# Patient Record
Sex: Female | Born: 1950 | Race: Black or African American | Hispanic: No | Marital: Married | Smoking: Never smoker
Health system: Southern US, Community
[De-identification: ages and names within clinical notes are randomized; demographics above are authoritative.]

## PROBLEM LIST (undated history)

## (undated) DIAGNOSIS — E119 Type 2 diabetes mellitus without complications: Secondary | ICD-10-CM

---

## 2003-12-24 ENCOUNTER — Ambulatory Visit: Payer: Self-pay

## 2004-01-24 ENCOUNTER — Ambulatory Visit: Payer: Self-pay

## 2004-02-23 ENCOUNTER — Ambulatory Visit: Payer: Self-pay

## 2005-10-20 ENCOUNTER — Emergency Department: Payer: Self-pay | Admitting: Emergency Medicine

## 2007-07-01 ENCOUNTER — Emergency Department: Payer: Self-pay | Admitting: Emergency Medicine

## 2007-07-07 ENCOUNTER — Other Ambulatory Visit: Payer: Self-pay

## 2007-07-07 ENCOUNTER — Ambulatory Visit: Payer: Self-pay | Admitting: Unknown Physician Specialty

## 2007-07-08 ENCOUNTER — Ambulatory Visit: Payer: Self-pay | Admitting: Cardiology

## 2008-02-15 ENCOUNTER — Ambulatory Visit: Payer: Self-pay | Admitting: Unknown Physician Specialty

## 2008-02-22 ENCOUNTER — Ambulatory Visit: Payer: Self-pay | Admitting: Unknown Physician Specialty

## 2008-05-13 ENCOUNTER — Ambulatory Visit: Payer: Self-pay | Admitting: Unknown Physician Specialty

## 2008-05-20 ENCOUNTER — Ambulatory Visit: Payer: Self-pay | Admitting: Unknown Physician Specialty

## 2009-03-20 ENCOUNTER — Inpatient Hospital Stay: Payer: Self-pay | Admitting: Internal Medicine

## 2011-11-21 ENCOUNTER — Ambulatory Visit: Payer: Self-pay | Admitting: Family Medicine

## 2011-12-16 ENCOUNTER — Ambulatory Visit: Payer: Self-pay | Admitting: Family Medicine

## 2012-01-09 ENCOUNTER — Ambulatory Visit: Payer: Self-pay | Admitting: Family Medicine

## 2013-04-18 ENCOUNTER — Emergency Department: Payer: Self-pay | Admitting: Internal Medicine

## 2013-04-18 LAB — URINALYSIS, COMPLETE
Bilirubin,UR: NEGATIVE
Blood: NEGATIVE
Glucose,UR: 150 mg/dL (ref 0–75)
Hyaline Cast: 7
Ketone: NEGATIVE
Nitrite: NEGATIVE
Ph: 5 (ref 4.5–8.0)
Specific Gravity: 1.024 (ref 1.003–1.030)
Squamous Epithelial: 25
WBC UR: 10 /HPF (ref 0–5)

## 2013-04-18 LAB — COMPREHENSIVE METABOLIC PANEL
ALT: 18 U/L (ref 12–78)
ANION GAP: 7 (ref 7–16)
Albumin: 3.2 g/dL — ABNORMAL LOW (ref 3.4–5.0)
Alkaline Phosphatase: 88 U/L
BUN: 13 mg/dL (ref 7–18)
Bilirubin,Total: 0.2 mg/dL (ref 0.2–1.0)
CO2: 28 mmol/L (ref 21–32)
Calcium, Total: 9.2 mg/dL (ref 8.5–10.1)
Chloride: 99 mmol/L (ref 98–107)
Creatinine: 1.01 mg/dL (ref 0.60–1.30)
EGFR (Non-African Amer.): >60
Glucose: 239 mg/dL — ABNORMAL HIGH (ref 65–99)
Osmolality: 276 (ref 275–301)
POTASSIUM: 4.2 mmol/L (ref 3.5–5.1)
SGOT(AST): 19 U/L (ref 15–37)
Sodium: 134 mmol/L — ABNORMAL LOW (ref 136–145)
TOTAL PROTEIN: 7.4 g/dL (ref 6.4–8.2)

## 2013-04-18 LAB — CBC
HCT: 37 % (ref 35.0–47.0)
HGB: 11.4 g/dL — ABNORMAL LOW (ref 12.0–16.0)
MCH: 24.6 pg — ABNORMAL LOW (ref 26.0–34.0)
MCHC: 30.7 g/dL — ABNORMAL LOW (ref 32.0–36.0)
MCV: 80 fL (ref 80–100)
Platelet: 181 10*3/uL (ref 150–440)
RBC: 4.63 10*6/uL (ref 3.80–5.20)
RDW: 16.9 % — ABNORMAL HIGH (ref 11.5–14.5)
WBC: 7.8 10*3/uL (ref 3.6–11.0)

## 2013-05-03 ENCOUNTER — Encounter: Payer: Self-pay | Admitting: Family Medicine

## 2013-05-23 ENCOUNTER — Encounter: Payer: Self-pay | Admitting: Family Medicine

## 2013-07-07 ENCOUNTER — Encounter: Payer: Self-pay | Admitting: Surgery

## 2013-07-23 ENCOUNTER — Encounter: Payer: Self-pay | Admitting: Surgery

## 2014-11-23 ENCOUNTER — Ambulatory Visit
Admission: RE | Admit: 2014-11-23 | Discharge: 2014-11-23 | Disposition: A | Discharge status: Home / Self Care | Payer: Medicare HMO | Source: Ambulatory Visit | Attending: Family Medicine | Admitting: Family Medicine

## 2014-11-23 ENCOUNTER — Other Ambulatory Visit: Payer: Self-pay | Admitting: Family Medicine

## 2014-11-23 DIAGNOSIS — M8580 Other specified disorders of bone density and structure, unspecified site: Secondary | ICD-10-CM | POA: Diagnosis not present

## 2014-11-23 DIAGNOSIS — M25551 Pain in right hip: Secondary | ICD-10-CM

## 2014-11-23 DIAGNOSIS — M79606 Pain in leg, unspecified: Secondary | ICD-10-CM

## 2015-03-02 ENCOUNTER — Other Ambulatory Visit: Payer: Self-pay | Admitting: Family Medicine

## 2015-03-02 DIAGNOSIS — Z1231 Encounter for screening mammogram for malignant neoplasm of breast: Secondary | ICD-10-CM

## 2015-03-13 ENCOUNTER — Ambulatory Visit
Admission: RE | Admit: 2015-03-13 | Discharge: 2015-03-13 | Disposition: A | Discharge status: Home / Self Care | Payer: Medicare HMO | Source: Ambulatory Visit | Attending: Family Medicine | Admitting: Family Medicine

## 2015-03-13 DIAGNOSIS — Z1231 Encounter for screening mammogram for malignant neoplasm of breast: Secondary | ICD-10-CM | POA: Insufficient documentation

## 2016-07-12 ENCOUNTER — Other Ambulatory Visit
Admission: RE | Admit: 2016-07-12 | Discharge: 2016-07-12 | Disposition: A | Discharge status: Home / Self Care | Payer: Medicare HMO | Source: Ambulatory Visit | Attending: Nurse Practitioner | Admitting: Nurse Practitioner

## 2016-07-12 ENCOUNTER — Ambulatory Visit
Admission: RE | Admit: 2016-07-12 | Discharge: 2016-07-12 | Disposition: A | Discharge status: Home / Self Care | Payer: Medicare HMO | Source: Ambulatory Visit | Attending: Nurse Practitioner | Admitting: Nurse Practitioner

## 2016-07-12 ENCOUNTER — Encounter: Payer: Medicare HMO | Attending: Nurse Practitioner | Admitting: Nurse Practitioner

## 2016-07-12 ENCOUNTER — Other Ambulatory Visit: Payer: Self-pay | Admitting: Nurse Practitioner

## 2016-07-12 DIAGNOSIS — Z885 Allergy status to narcotic agent status: Secondary | ICD-10-CM | POA: Diagnosis not present

## 2016-07-12 DIAGNOSIS — E11622 Type 2 diabetes mellitus with other skin ulcer: Secondary | ICD-10-CM | POA: Insufficient documentation

## 2016-07-12 DIAGNOSIS — I110 Hypertensive heart disease with heart failure: Secondary | ICD-10-CM | POA: Insufficient documentation

## 2016-07-12 DIAGNOSIS — E11621 Type 2 diabetes mellitus with foot ulcer: Secondary | ICD-10-CM | POA: Diagnosis present

## 2016-07-12 DIAGNOSIS — Z794 Long term (current) use of insulin: Secondary | ICD-10-CM | POA: Insufficient documentation

## 2016-07-12 DIAGNOSIS — L97529 Non-pressure chronic ulcer of other part of left foot with unspecified severity: Secondary | ICD-10-CM | POA: Insufficient documentation

## 2016-07-12 DIAGNOSIS — M1990 Unspecified osteoarthritis, unspecified site: Secondary | ICD-10-CM | POA: Insufficient documentation

## 2016-07-12 DIAGNOSIS — I509 Heart failure, unspecified: Secondary | ICD-10-CM | POA: Diagnosis not present

## 2016-07-12 DIAGNOSIS — Z8673 Personal history of transient ischemic attack (TIA), and cerebral infarction without residual deficits: Secondary | ICD-10-CM | POA: Insufficient documentation

## 2016-07-12 DIAGNOSIS — M85872 Other specified disorders of bone density and structure, left ankle and foot: Secondary | ICD-10-CM | POA: Insufficient documentation

## 2016-07-12 DIAGNOSIS — B999 Unspecified infectious disease: Secondary | ICD-10-CM

## 2016-07-12 DIAGNOSIS — F17218 Nicotine dependence, cigarettes, with other nicotine-induced disorders: Secondary | ICD-10-CM | POA: Insufficient documentation

## 2016-07-12 DIAGNOSIS — L97511 Non-pressure chronic ulcer of other part of right foot limited to breakdown of skin: Secondary | ICD-10-CM | POA: Insufficient documentation

## 2016-07-12 DIAGNOSIS — L089 Local infection of the skin and subcutaneous tissue, unspecified: Secondary | ICD-10-CM | POA: Insufficient documentation

## 2016-07-12 DIAGNOSIS — J449 Chronic obstructive pulmonary disease, unspecified: Secondary | ICD-10-CM | POA: Insufficient documentation

## 2016-07-12 DIAGNOSIS — Z880 Allergy status to penicillin: Secondary | ICD-10-CM | POA: Diagnosis not present

## 2016-07-12 DIAGNOSIS — F329 Major depressive disorder, single episode, unspecified: Secondary | ICD-10-CM | POA: Insufficient documentation

## 2016-07-12 DIAGNOSIS — L97212 Non-pressure chronic ulcer of right calf with fat layer exposed: Secondary | ICD-10-CM | POA: Insufficient documentation

## 2016-07-14 NOTE — Progress Notes (Signed)
[name] ([id number]) Visit Report for 07/12/2016 Abuse/Suicide Risk Screen Details Patient Name: [name], [name]. Date of Service: 07/12/2016 10:30 AM Medical Record Number: [id number] Patient Account Number: [id number] Date of Birth/Sex: [birth year] (66 y.o. Female) Treating RN: Ashok Cordia, [name] Primary Care Pebbles Zeiders: Tonia Ghent Other Clinician: Referring Dannetta Lekas: Tonia Ghent Treating Suttyn Cryder/Extender: Kathreen Cosier in Treatment: 0 Abuse/Suicide Risk Screen Items Answer ABUSE/SUICIDE RISK SCREEN: Has anyone close to you tried to hurt or harm you recentlyo No Do you feel uncomfortable with anyone in your familyo No Has anyone forced you do things that you didnot want to doo No Do you have any thoughts of harming yourselfo No Patient displays signs or symptoms of abuse and/or neglect. No Electronic Signature(s) Signed: 07/12/2016 4:12:19 PM By: Alejandro Mulling Entered By: Alejandro Mulling on 07/12/2016 10:58:17 Hurrell, Timmy J. ([id number]) -------------------------------------------------------------------------------- Activities of Daily Living Details Patient Name: [name]. Date of Service: 07/12/2016 10:30 AM Medical Record Number: [id number] Patient Account Number: [id number] Date of Birth/Sex: [birth year] (66 y.o. Female) Treating RN: Ashok Cordia, [name] Primary Care Sylva Overley: Tonia Ghent Other Clinician: Referring Cheyenne Schumm: Tonia Ghent Treating Aliyanah Rozas/Extender: Kathreen Cosier in Treatment: 0 Activities of Daily Living Items Answer Activities of Daily Living (Please select one for each item) Drive Automobile Completely Able Take Medications Completely Able Use Telephone Completely Able Care for Appearance Completely Able Use Toilet Completely Able Bath / Shower Completely Able Dress Self Completely Able Feed Self Completely Able Walk Need Assistance Get In / Out Bed Completely Able Housework Completely Able Prepare Meals Completely Able Handle  Money Completely Able Shop for Self Completely Able Electronic Signature(s) Signed: 07/12/2016 4:12:19 PM By: Alejandro Mulling Entered By: Alejandro Mulling on 07/12/2016 10:58:50 Porzio, [name] ([id number]) -------------------------------------------------------------------------------- Education Assessment Details Patient Name: [name]. Date of Service: 07/12/2016 10:30 AM Medical Record Number: [id number] Patient Account Number: [id number] Date of Birth/Sex: 1950-12-05 (66 y.o. Female) Treating RN: Ashok Cordia, [name] Primary Care Whitlee Sluder: Tonia Ghent Other Clinician: Referring Jadalyn Oliveri: Tonia Ghent Treating Amari Burnsworth/Extender: Kathreen Cosier in Treatment: 0 Learning Preferences/Education Level/Primary Language Learning Preference: Explanation, Printed Material Highest Education Level: College or Above Preferred Language: English Cognitive Barrier Assessment/Beliefs Language Barrier: No Translator Needed: No Memory Deficit: No Emotional Barrier: No Cultural/Religious Beliefs Affecting Medical No Care: Physical Barrier Assessment Impaired Vision: Yes Glasses Impaired Hearing: No Decreased Hand dexterity: No Knowledge/Comprehension Assessment Knowledge Level: Medium Comprehension Level: Medium Ability to understand written Medium instructions: Ability to understand verbal Medium instructions: Motivation Assessment Anxiety Level: Calm Cooperation: Cooperative Education Importance: Acknowledges Need Interest in Health Problems: Asks Questions Perception: Coherent Willingness to Engage in Self- Medium Management Activities: Readiness to Engage in Self- Medium Management Activities: Electronic Signature(s) Signed: 07/12/2016 4:12:19 PM By: Hollie Beach, Shequita J. ([id number]) Entered By: Alejandro Mulling on 07/12/2016 10:59:12 Muench, Wilfred Shela Commons ([id number]) -------------------------------------------------------------------------------- Fall Risk  Assessment Details Patient Name: [name]. Date of Service: 07/12/2016 10:30 AM Medical Record Number: [id number] Patient Account Number: [id number] Date of Birth/Sex: 1950-11-15 (66 y.o. Female) Treating RN: Ashok Cordia, [name] Primary Care Lillyrose Reitan: Tonia Ghent Other Clinician: Referring Dariel Betzer: Tonia Ghent Treating Shirlyn Savin/Extender: Kathreen Cosier in Treatment: 0 Fall Risk Assessment Items Have you had 2 or more falls in the last 12 monthso 0 No Have you had any fall that resulted in injury in the last 12 monthso 0 No FALL RISK ASSESSMENT: History of falling - immediate or within 3 months 0 No Secondary diagnosis 15 Yes Ambulatory aid None/bed rest/wheelchair/nurse 0 No Crutches/cane/walker 15 Yes Furniture 0 No  IV Access/Saline Lock 0 No Gait/Training Normal/bed rest/immobile 0 No Weak 0 No Impaired 20 Yes Mental Status Oriented to own ability 0 Yes Electronic Signature(s) Signed: 07/12/2016 4:12:19 PM By: Alejandro Mulling Entered By: Alejandro Mulling on 07/12/2016 10:59:43 Mcclane, Henya J. ([id number]) -------------------------------------------------------------------------------- Foot Assessment Details Patient Name: [name]. Date of Service: 07/12/2016 10:30 AM Medical Record Number: [id number] Patient Account Number: [id number] Date of Birth/Sex: 06-03-1950 (66 y.o. Female) Treating RN: Ashok Cordia, [name] Primary Care Piper Hassebrock: Tonia Ghent Other Clinician: Referring Laina Guerrieri: Tonia Ghent Treating Almond Fitzgibbon/Extender: Kathreen Cosier in Treatment: 0 Foot Assessment Items Site Locations + = Sensation present, - = Sensation absent, C = Callus, U = Ulcer R = Redness, W = Warmth, M = Maceration, PU = Pre-ulcerative lesion F = Fissure, S = Swelling, D = Dryness Assessment Right: Left: Other Deformity: No No Prior Foot Ulcer: No No Prior Amputation: No No Charcot Joint: No No Ambulatory Status: Ambulatory With Help Assistance Device: Cane Gait:  Steady Electronic Signature(s) Signed: 07/12/2016 4:12:19 PM By: Alejandro Mulling Entered By: Alejandro Mulling on 07/12/2016 11:02:36 Torosian, Shanyah J. ([id number]) -------------------------------------------------------------------------------- Nutrition Risk Assessment Details Patient Name: [name]. Date of Service: 07/12/2016 10:30 AM Medical Record Number: [id number] Patient Account Number: [id number] Date of Birth/Sex: 07-23-1950 (66 y.o. Female) Treating RN: Ashok Cordia, [name] Primary Care Kinga Cassar: Tonia Ghent Other Clinician: Referring Galo Sayed: Tonia Ghent Treating Darral Rishel/Extender: Kathreen Cosier in Treatment: 0 Height (in): 63 Weight (lbs): 224 Body Mass Index (BMI): 39.7 Nutrition Risk Assessment Items NUTRITION RISK SCREEN: I have an illness or condition that made me change the kind and/or 2 Yes amount of food I eat I eat fewer than two meals per day 3 Yes I eat few fruits and vegetables, or milk products 0 No I have three or more drinks of beer, liquor or wine almost every day 0 No I have tooth or mouth problems that make it hard for me to eat 0 No I don't always have enough money to buy the food I need 0 No I eat alone most of the time 0 No I take three or more different prescribed or over-the-counter drugs a 1 Yes day Without wanting to, I have lost or gained 10 pounds in the last six 0 No months I am not always physically able to shop, cook and/or feed myself 0 No Nutrition Protocols Good Risk Protocol Moderate Risk Protocol Electronic Signature(s) Signed: 07/12/2016 4:12:19 PM By: Alejandro Mulling Entered By: Alejandro Mulling on 07/12/2016 11:00:15

## 2016-07-14 NOTE — Progress Notes (Signed)
KEIMORA, SWARTOUT ([id number]) Visit Report for 07/12/2016 Allergy List Details Patient Name: [name], STEPP. Date of Service: 07/12/2016 10:30 AM Medical Record Number: [id number] Patient Account Number: [id number] Date of Birth/Sex: 1951/03/17 (66 y.o. Female) Treating RN: Ashok Cordia, [name] Primary Care Phillipe Clemon: Tonia Ghent Other Clinician: Referring Ishmeal Rorie: Tonia Ghent Treating Curties Conigliaro/Extender: Kathreen Cosier in Treatment: 0 Allergies Active Allergies morphine hydrocodone codeine mercury based products penicillin bee stings Allergy Notes Electronic Signature(s) Signed: 07/12/2016 4:12:19 PM By: Alejandro Mulling Entered By: Alejandro Mulling on 07/12/2016 10:53:59 [name], [name] ([id number]) -------------------------------------------------------------------------------- Arrival Information Details Patient Name: [name], WHITIS. Date of Service: 07/12/2016 10:30 AM Medical Record Number: [id number] Patient Account Number: [id number] Date of Birth/Sex: 12-29-1950 (66 y.o. Female) Treating RN: Ashok Cordia, [name] Primary Care Winner Valeriano: Tonia Ghent Other Clinician: Referring Abril Cappiello: Tonia Ghent Treating Merlen Gurry/Extender: Kathreen Cosier in Treatment: 0 Visit Information Patient Arrived: Cane Arrival Time: 10:46 Accompanied By: self Transfer Assistance: EasyPivot Patient Lift Patient Identification Verified: Yes Secondary Verification Process Yes Completed: Patient Requires Transmission- No Based Precautions: Patient Has Alerts: Yes Patient Alerts: Dm II History Since Last Visit All ordered tests and consults were completed: No Added or deleted any medications: No Any new allergies or adverse reactions: No Had a fall or experienced change in activities of daily living that may affect risk of falls: No Signs or symptoms of abuse/neglect since last visito No Hospitalized since last visit: No Electronic Signature(s) Signed: 07/12/2016 4:12:19 PM By: Alejandro Mulling Entered By: Alejandro Mulling on 07/12/2016 10:49:58 Perillo, Janequa J. ([id number]) -------------------------------------------------------------------------------- Clinic Level of Care Assessment Details Patient Name: Kathleen Pigg. Date of Service: 07/12/2016 10:30 AM Medical Record Number: [id number] Patient Account Number: [id number] Date of Birth/Sex: [birth year] (66 y.o. Female) Treating RN: Ashok Cordia, [name] Primary Care Marizol Borror: Tonia Ghent Other Clinician: Referring Shatia Sindoni: Tonia Ghent Treating Colen Eltzroth/Extender: Kathreen Cosier in Treatment: 0 Clinic Level of Care Assessment Items TOOL 1 Quantity Score X - Use when EandM and Procedure is performed on INITIAL visit 1 0 ASSESSMENTS - Nursing Assessment / Reassessment X - General Physical Exam (combine w/ comprehensive assessment (listed just 1 20 below) when performed on new pt. evals) X - Comprehensive Assessment (HX, ROS, Risk Assessments, Wounds Hx, etc.) 1 25 ASSESSMENTS - Wound and Skin Assessment / Reassessment []  - Dermatologic / Skin Assessment (not related to wound area) 0 ASSESSMENTS - Ostomy and/or Continence Assessment and Care []  - Incontinence Assessment and Management 0 []  - Ostomy Care Assessment and Management (repouching, etc.) 0 PROCESS - Coordination of Care []  - Simple Patient / Family Education for ongoing care 0 X - Complex (extensive) Patient / Family Education for ongoing care 1 20 X - Staff obtains Chiropractor, Records, Test Results / Process Orders 1 10 []  - Staff telephones HHA, Nursing Homes / Clarify orders / etc 0 []  - Routine Transfer to another Facility (non-emergent condition) 0 []  - Routine Hospital Admission (non-emergent condition) 0 X - New Admissions / Manufacturing engineer / Ordering NPWT, Apligraf, etc. 1 15 []  - Emergency Hospital Admission (emergent condition) 0 PROCESS - Special Needs []  - Pediatric / Minor Patient Management 0 []  - Isolation Patient Management  0 [name], [name] ([id number]) []  - Hearing / Language / Visual special needs 0 []  - Assessment of Community assistance (transportation, D/C planning, etc.) 0 []  - Additional assistance / Altered mentation 0 []  - Support Surface(s) Assessment (bed, cushion, seat, etc.) 0 INTERVENTIONS - Miscellaneous []  - External ear exam 0 []  - Patient Transfer (multiple staff /  Hoyer Lift / Similar devices) 0  - Simple Staple / Suture removal (25 or less) 0  - Complex Staple / Suture removal (26 or more) 0  - Hypo/Hyperglycemic Management (do not check if billed separately) 0 X - Ankle / Brachial Index (ABI) - do not check if billed separately 1 15 Has the patient been seen at the hospital within the last three years: Yes Total Score: 105 Level Of Care: New/Established - Level 3 Electronic Signature(s) Signed: 07/12/2016 4:12:19 PM By: Alejandro Mulling Entered By: Alejandro Mulling on 07/12/2016 13:57:10 [name], [name] -------------------------------------------------------------------------------- Encounter Discharge Information Details Patient Name: Kathleen Pigg. Date of Service: 07/12/2016 10:30 AM Medical Record Number: [id number] Patient Account Number: [id number] Date of Birth/Sex: 1951-01-01 (66 y.o. Female) Treating RN: Ashok Cordia, [name] Primary Care Nakiea Metzner: Tonia Ghent Other Clinician: Referring Khylin Gutridge: Tonia Ghent Treating Kimbery Harwood/Extender: Kathreen Cosier in Treatment: 0 Encounter Discharge Information Items Discharge Pain Level: 0 Discharge Condition: Stable Ambulatory Status: Cane Discharge Destination: Home Transportation: Private Auto Accompanied By: self Schedule Follow-up Appointment: Yes Medication Reconciliation completed No and provided to Patient/Care Lucretia Pendley: Provided on Clinical Summary of Care: 07/12/2016 Form Type Recipient Paper Patient EP Electronic Signature(s) Signed: 07/12/2016 12:16:26 PM By: Gwenlyn Perking Entered By: Gwenlyn Perking on 07/12/2016 12:16:26 [name], [name] ([id number]) -------------------------------------------------------------------------------- General Visit Notes Details Patient Name: Kathleen Pigg. Date of Service: 07/12/2016 10:30 AM Medical Record Number: [id number] Patient Account Number: [id number] Date of Birth/Sex: [birth year] (66 y.o. Female) Treating RN: Ashok Cordia, [name] Primary Care Sebastion Jun: Tonia Ghent Other Clinician: Referring Zaiden Ludlum: Tonia Ghent Treating Damari Hiltz/Extender: Kathreen Cosier in Treatment: 0 Notes Gave pt a blood sugar log. Electronic Signature(s) Signed: 07/12/2016 2:22:05 PM By: Alejandro Mulling Entered By: Alejandro Mulling on 07/12/2016 14:22:05 [name], [name] ([id number]) -------------------------------------------------------------------------------- Lower Extremity Assessment Details Patient Name: [name], CURRIE. Date of Service: 07/12/2016 10:30 AM Medical Record Number: [id number] Patient Account Number: [id number] Date of Birth/Sex: Nov 10, 1950 (66 y.o. Female) Treating RN: Ashok Cordia, [name] Primary Care Jeannine Pennisi: Tonia Ghent Other Clinician: Referring Betania Dizon: Tonia Ghent Treating Annmarie Plemmons/Extender: Kathreen Cosier in Treatment: 0 Edema Assessment Assessed: [Left: No] [Right: No] E[Left: dema] [Right: :] Calf Left: Right: Point of Measurement: 32 cm From Medial Instep 36 cm cm Ankle Left: Right: Point of Measurement: 11 cm From Medial Instep 19.7 cm cm Vascular Assessment Pulses: Dorsalis Pedis Palpable: [Left:Yes] Posterior Tibial Extremity colors, hair growth, and conditions: Extremity Color: [Left:Normal] Temperature of Extremity: [Left:Warm] Capillary Refill: [Left:< 3 seconds] Blood Pressure: Brachial: [Left:130] Dorsalis Pedis: 120 [Left:Dorsalis Pedis:] Ankle: Posterior Tibial: 122 [Left:Posterior Tibial: 0.94] Toe Nail Assessment Left: Right: Thick: Yes Discolored: No Deformed: No Improper Length and Hygiene:  No Electronic Signature(s) Signed: 07/12/2016 4:12:19 PM By: Hollie Beach, Nikky Shela Commons ([id number]) Entered By: Alejandro Mulling on 07/12/2016 11:08:52 Sanagustin, Erisha J. ([id number]) -------------------------------------------------------------------------------- Multi Wound Chart Details Patient Name: Kathleen Pigg. Date of Service: 07/12/2016 10:30 AM Medical Record Number: [id number] Patient Account Number: [id number] Date of Birth/Sex: [birth year] (66 y.o. 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] [3:the Lower Extremity] [N/A:N/A] Secondary Etiology: [2:Trauma, Other] [3:Infection -  not elsewhere classified] [N/A:N/A] Comorbid History: [2:Asthma, Chronic Obstructive Pulmonary Disease (COPD), Congestive Heart Failure, Congestive Heart Failure, Hypertension, Type II Diabetes, Osteoarthritis, Diabetes, Osteoarthritis, Neuropathy] [3:Asthma, Chronic Obstructive Pulmonary  Disease (COPD), Hypertension, Type II Neuropathy] [N/A:N/A] Date Acquired: [2:06/19/2016] [3:06/14/2016] [N/A:N/A] Weeks of Treatment: [2:0] [3:0] [N/A:N/A] Wound Status: [2:Open] [3:Open] [N/A:N/A] Clustered Wound: [2:Yes] [3:No] [N/A:N/A] Clustered Quantity: [2:2] [3:N/A] [N/A:N/A] Pending Amputation on No [3:Yes] [N/A:N/A] Presentation: Measurements L x W x D 0.6x1.5x0.1 [3:3x2.5x0.1] [N/A:N/A] (cm) Area (cm) : [2:0.707]  [3:5.89] [N/A:N/A] Volume (cm) : [2:0.071] [3:0.589] [N/A:N/A] % Reduction in Area: [2:0.00%] [3:0.00%] [N/A:N/A] % Reduction in Volume: 0.00% [3:0.00%] [N/A:N/A] Starting Position 1 [3:12] (o'clock): [3:4] Ending Position 1 (o'clock): Maximum Distance 1 2.5 (cm): Undermining: No Yes N/A Classification: Grade 1 Grade 1 N/A Exudate Amount: Medium Medium N/A Exudate Type: Serosanguineous Purulent N/A Exudate Color: red, brown yellow, brown, green N/A Wound Margin: Distinct, outline attached Distinct, outline attached N/A Granulation Amount: None Present (0%) None Present (0%) N/A Necrotic Amount: Large (67-100%) Large (67-100%) N/A Necrotic Tissue: Eschar, Adherent Slough Eschar, Adherent Slough N/A Exposed Structures: Fascia: No Fascia: No N/A Fat Layer (Subcutaneous Fat Layer (Subcutaneous Tissue) Exposed: No Tissue) Exposed: No Tendon: No Tendon: No Muscle: No Muscle: No Joint: No Joint: No Bone: No Bone: No Limited to Skin Limited to Skin Breakdown Breakdown Epithelialization: None None N/A Debridement: N/A Debridement ([id number] N/A [id number]) Pre-procedure N/A 11:47 N/A Verification/Time Out Taken: Pain Control: N/A Lidocaine 4% Topical N/A Solution Tissue Debrided: N/A Fibrin/Slough, Exudates, N/A Subcutaneous Level: N/A Skin/Subcutaneous N/A Tissue Debridement Area (sq N/A 7.5 N/A cm): Instrument: N/A Blade, Forceps N/A Specimen: N/A Swab N/A Number of Specimens N/A 1 N/A Taken: Bleeding: N/A Minimum N/A Hemostasis Achieved: N/A Pressure N/A Procedural Pain: N/A 0 N/A Post Procedural Pain: N/A 0 N/A Debridement Treatment N/A Procedure was tolerated N/A Response: well Post Debridement N/A 3x2.5x0.1 N/A Measurements L x W x D (cm) [name], [name] ([id number]) Post Debridement N/A 0.589 N/A Volume: (cm) Periwound Skin Texture: No Abnormalities Noted No Abnormalities Noted N/A Periwound Skin Dry/Scaly: Yes Dry/Scaly: Yes N/A Moisture: Periwound Skin  Color: No Abnormalities Noted No Abnormalities Noted N/A Temperature: No Abnormality No Abnormality N/A Tenderness on Yes Yes N/A Palpation: Wound Preparation: Ulcer Cleansing: Ulcer Cleansing: N/A Rinsed/Irrigated with Rinsed/Irrigated with Saline Saline Topical Anesthetic Topical Anesthetic Applied: Other: lidocaine Applied: Other: lidocaine 4% 4% Procedures Performed: N/A Debridement N/A Treatment Notes Wound #2 (Left, Lateral Lower Leg) 1. Cleansed with: Clean wound with Normal Saline 2. Anesthetic Topical Lidocaine 4% cream to wound bed prior to debridement 3. Peri-wound Care: Skin Prep 4. Dressing Applied: Iodoflex 5. Secondary Dressing Applied Bordered Foam Dressing Dry Gauze Wound #3 (Left, Medial Toe Great) 1. Cleansed with: Clean wound with Normal Saline 2. Anesthetic Topical Lidocaine 4% cream to wound bed prior to debridement 4. Dressing Applied: Iodoflex 5. Secondary Dressing Applied Dry Gauze Kerlix/Conform 7. Secured with Tape Notes darco shoe [name], [name] ([id number]) Electronic Signature(s) Signed: 07/12/2016 1:01:38 PM By: Bonnell Public Entered By: Bonnell Public on 07/12/2016 13:01:38 [name], [name] ([id number]) -------------------------------------------------------------------------------- Multi-Disciplinary Care Plan Details Patient Name: NAKESHIA, WALDECK. Date of Service: 07/12/2016 10:30 AM Medical Record Number: [id number] Patient Account Number: [id number] Date of Birth/Sex: [birth year] (66 y.o. Female) Treating RN: Ashok Cordia, [name] Primary Care Jeani Fassnacht: Tonia Ghent Other Clinician: Referring Doha Boling: Tonia Ghent Treating Treesa Mccully/Extender: Kathreen Cosier in Treatment: 0 Active Inactive ` Abuse / Safety / Falls / Self Care Management Nursing Diagnoses: Potential for falls Goals: Patient will remain  injury free Date Initiated: 07/12/2016 Target Resolution Date: 10/26/2016 Goal Status: Active Interventions: Assess fall risk on  admission and as needed Assess impairment of mobility on admission and as needed per policy Notes: ` Nutrition Nursing Diagnoses: Imbalanced nutrition Impaired glucose control: actual or potential Potential for alteratiion in Nutrition/Potential for imbalanced nutrition Goals: Patient/caregiver agrees to and verbalizes understanding of need to use nutritional supplements and/or vitamins as prescribed Date Initiated: 07/12/2016 Target Resolution Date: 09/28/2016 Goal Status: Active Interventions: Assess patient nutrition upon admission and as needed per policy Provide education on elevated blood sugars and impact on wound healing Notes: BOBETTE, LEYH ([id number]) Orientation to the Wound Care Program Nursing Diagnoses: Knowledge deficit related to the wound healing center program Goals: Patient/caregiver will verbalize understanding of the Wound Healing Center Program Date Initiated: 07/12/2016 Target Resolution Date: 07/27/2016 Goal Status: Active Interventions: Provide education on orientation to the wound center Notes: ` Pain, Acute or Chronic Nursing Diagnoses: Pain, acute or chronic: actual or potential Potential alteration in comfort, pain Goals: Patient/caregiver will verbalize adequate pain control between visits Date Initiated: 07/12/2016 Target Resolution Date: 10/26/2016 Goal Status: Active Interventions: Assess comfort goal upon admission Complete pain assessment as per visit requirements Notes: ` Soft Tissue Infection Nursing Diagnoses: Impaired tissue integrity Knowledge deficit related to disease process and management Knowledge deficit related to home infection control: handwashing, handling of soiled dressings, supply storage Goals: Patient/caregiver will verbalize understanding of or measures to prevent infection and contamination in the home setting Date Initiated: 07/12/2016 Target Resolution Date: 09/28/2016 Goal Status: Active MERYN, SARRACINO  ([id number]) Interventions: Assess signs and symptoms of infection every visit Notes: ` Wound/Skin Impairment Nursing Diagnoses: Impaired tissue integrity Knowledge deficit related to smoking impact on wound healing Knowledge deficit related to ulceration/compromised skin integrity Goals: Ulcer/skin breakdown will have a volume reduction of 80% by week 12 Date Initiated: 07/12/2016 Target Resolution Date: 10/26/2016 Goal Status: Active Interventions: Assess patient/caregiver ability to perform ulcer/skin care regimen upon admission and as needed Assess ulceration(s) every visit Provide education on smoking Notes: Electronic Signature(s) Signed: 07/12/2016 4:12:19 PM By: Alejandro Mulling Entered By: Alejandro Mulling on 07/12/2016 11:25:10 Leone, Ladaja J. ([id number]) -------------------------------------------------------------------------------- Pain Assessment Details Patient Name: Kathleen Pigg. Date of Service: 07/12/2016 10:30 AM Medical Record Number: [id number] Patient Account Number: [id number] Date of Birth/Sex: 12-Mar-1951 (66 y.o. Female) Treating RN: Ashok Cordia, [name] Primary Care Aldahir Litaker: Tonia Ghent Other Clinician: Referring Marjoria Mancillas: Tonia Ghent Treating [place]/Extender: Kathreen Cosier in Treatment: 0 Active Problems Location of Pain Severity and Description of Pain Patient Has Paino Yes Site Locations Pain Location: Pain in Ulcers With Dressing Change: Yes Rate the pain. Current Pain Level: 8 Character of Pain Describe the Pain: Aching Pain Management and Medication Current Pain Management: Electronic Signature(s) Signed: 07/12/2016 4:12:19 PM By: Alejandro Mulling Entered By: Alejandro Mulling on 07/12/2016 10:50:01 Noll, [name] ([id number]) -------------------------------------------------------------------------------- Patient/Caregiver Education Details Patient Name: KEYLAH, DARWISH. Date of Service: 07/12/2016 10:30 AM Medical Record Number:  [id number] Patient Account Number: [id number] Date of Birth/Gender: 21-Apr-1950 (66 y.o. Female) Treating RN: Ashok Cordia, [name] Primary Care Physician: Tonia Ghent Other Clinician: Referring Physician: Tonia Ghent Treating Physician/Extender: Kathreen Cosier in Treatment: 0 Education Assessment Education Provided To: Patient Education Topics Provided Elevated Blood Sugar/ Impact on Healing: Handouts: Elevated Blood Sugars: How Do They Affect Wound Healing Methods: Explain/Verbal Responses: State content correctly Smoking and Wound Healing: Handouts: Smoking and Wound Healing Methods: Explain/Verbal Responses: State content correctly Welcome To The Wound Care Center: Handouts: Welcome To  The Wound Care Center Methods: Explain/Verbal Responses: State content correctly Wound/Skin Impairment: Handouts: Other: change dressing as ordered Methods: Demonstration, Explain/Verbal Responses: State content correctly Electronic Signature(s) Signed: 07/12/2016 4:12:19 PM By: Alejandro Mulling Entered By: Alejandro Mulling on 07/12/2016 11:26:06 Pilant, Lashell J. ([id number]) -------------------------------------------------------------------------------- Wound Assessment Details Patient Name: Kathleen Pigg. Date of Service: 07/12/2016 10:30 AM Medical Record Number: [id number] Patient Account Number: [id number] Date of Birth/Sex: 09-Mar-1951 (66 y.o. Female) Treating RN: Ashok Cordia, [name] Primary Care Audreyanna Butkiewicz: Tonia Ghent Other Clinician: Referring Armani Brar: Tonia Ghent Treating Lylianna Fraiser/Extender: Kathreen Cosier in Treatment: 0 Wound Status Wound Number: 2 Primary Diabetic Wound/Ulcer of the Lower Etiology: Extremity Wound Location: Left Lower Leg - Lateral Secondary Trauma, Other Wounding Event: Trauma Etiology: Date Acquired: 06/19/2016 Wound Open Weeks Of Treatment: 0 Status: Clustered Wound: Yes Comorbid Asthma, Chronic Obstructive History: Pulmonary Disease  (COPD), Congestive Heart Failure, Hypertension, Type II Diabetes, Osteoarthritis, Neuropathy Photos Photo Uploaded By: Alejandro Mulling on 07/12/2016 14:27:07 Wound Measurements Length: (cm) 0.6 % Reduction i Width: (cm) 1.5 % Reduction i Depth: (cm) 0.1 Epithelializa Clustered Quantity: 2 Tunneling: Area: (cm) 0.707 Undermining: Volume: (cm) 0.071 n Area: 0% n Volume: 0% tion: None No No Wound Description Classification: Grade 1 Wound Margin: Distinct, outline attached Exudate Amount: Medium Exudate Type: Serosanguineous Exudate Color: red, brown Crull, Kealohilani J. ([id number]) Foul Odor After Cleansing: No Slough/Fibrino Yes Wound Bed Granulation Amount: None Present (0%) Exposed Structure Necrotic Amount: Large (67-100%) Fascia Exposed: No Necrotic Quality: Eschar, Adherent Slough Fat Layer (Subcutaneous Tissue) Exposed: No Tendon Exposed: No Muscle Exposed: No Joint Exposed: No Bone Exposed: No Limited to Skin Breakdown Periwound Skin Texture Texture Color No Abnormalities Noted: No No Abnormalities Noted: No Moisture Temperature / Pain No Abnormalities Noted: No Temperature: No Abnormality Dry / Scaly: Yes Tenderness on Palpation: Yes Wound Preparation Ulcer Cleansing: Rinsed/Irrigated with Saline Topical Anesthetic Applied: Other: lidocaine 4%, Treatment Notes Wound #2 (Left, Lateral Lower Leg) 1. Cleansed with: Clean wound with Normal Saline 2. Anesthetic Topical Lidocaine 4% cream to wound bed prior to debridement 3. Peri-wound Care: Skin Prep 4. Dressing Applied: Iodoflex 5. Secondary Dressing Applied Bordered Foam Dressing Dry Gauze Electronic Signature(s) Signed: 07/12/2016 4:12:19 PM By: Alejandro Mulling Entered By: Alejandro Mulling on 07/12/2016 11:24:16 Maffett, Onyinyechi J. ([id number]) -------------------------------------------------------------------------------- Wound Assessment Details Patient Name: APPOLLONIA, KLEE. Date of Service:  07/12/2016 10:30 AM Medical Record Number: [id number] Patient Account Number: [id number] Date of Birth/Sex: 1950-07-21 (66 y.o. Female) Treating RN: Ashok Cordia, [name] Primary Care [place]: Tonia Ghent Other Clinician: Referring Thurley Francesconi: Tonia Ghent Treating Tysheena Ginzburg/Extender: Kathreen Cosier in Treatment: 0 Wound Status Wound Number: 3 Primary Diabetic Wound/Ulcer of the Lower Etiology: Extremity Wound Location: Left, Medial Toe Great Secondary Infection - not elsewhere classified Wounding Event: Gradually Appeared Etiology: Date Acquired: 06/14/2016 Wound Open Weeks Of Treatment: 0 Status: Clustered Wound: No Comorbid Asthma, Chronic Obstructive Pending Amputation On Presentation History: Pulmonary Disease (COPD), Congestive Heart Failure, Hypertension, Type II Diabetes, Osteoarthritis, Neuropathy Photos Photo Uploaded By: Alejandro Mulling on 07/12/2016 14:27:30 Wound Measurements Length: (cm) 3 % Reduction i Width: (cm) 2.5 % Reduction i Depth: (cm) 0.1 Epithelializa Area: (cm) 5.89 Tunneling: Volume: (cm) 0.589 Undermining: Starting P Ending Pos Maximum Di n Area: 0% n Volume: 0% tion: None No Yes osition (o'clock): 12 ition (o'clock): 4 stance: (cm) 2.5 Wound Description Classification: Grade 1 Foul Odor Aft Wound Margin: Distinct, outline attached Slough/Fibrin Exudate Amount: Medium Strojny, Tawania J. ([id number]) er Cleansing: No o Yes Exudate Type: Purulent Exudate Color: yellow, brown,  green Wound Bed Granulation Amount: None Present (0%) Exposed Structure Necrotic Amount: Large (67-100%) Fascia Exposed: No Necrotic Quality: Eschar, Adherent Slough Fat Layer (Subcutaneous Tissue) Exposed: No Tendon Exposed: No Muscle Exposed: No Joint Exposed: No Bone Exposed: No Limited to Skin Breakdown Periwound Skin Texture Texture Color No Abnormalities Noted: No No Abnormalities Noted: No Moisture Temperature / Pain No Abnormalities Noted:  No Temperature: No Abnormality Dry / Scaly: Yes Tenderness on Palpation: Yes Wound Preparation Ulcer Cleansing: Rinsed/Irrigated with Saline Topical Anesthetic Applied: Other: lidocaine 4%, Treatment Notes Wound #3 (Left, Medial Toe Great) 1. Cleansed with: Clean wound with Normal Saline 2. Anesthetic Topical Lidocaine 4% cream to wound bed prior to debridement 4. Dressing Applied: Iodoflex 5. Secondary Dressing Applied Dry Gauze Kerlix/Conform 7. Secured with Tape Notes darco Engineering geologist) Signed: 07/12/2016 4:12:19 PM By: Alejandro Mulling Entered By: Alejandro Mulling on 07/12/2016 11:57:01 Mcghie, [name] -------------------------------------------------------------------------------- Vitals Details Patient Name: Kathleen Pigg. Date of Service: 07/12/2016 10:30 AM Medical Record Number: [id number] Patient Account Number: [id number] Date of Birth/Sex: [birth year] (66 y.o. Female) Treating RN: Ashok Cordia, [name] Primary Care Valda Christenson: Tonia Ghent Other Clinician: Referring Brayli Klingbeil: Tonia Ghent Treating Ronaldo Crilly/Extender: Kathreen Cosier in Treatment: 0 Vital Signs Time Taken: 10:50 Temperature (F): 98.4 Height (in): 63 Pulse (bpm): 100 Source: Stated Respiratory Rate (breaths/min): 20 Weight (lbs): 224 Blood Pressure (mmHg): 157/79 Source: Measured Reference Range: 80 - 120 mg / dl Body Mass Index (BMI): 39.7 Electronic Signature(s) Signed: 07/12/2016 4:12:19 PM By: Alejandro Mulling Entered By: Alejandro Mulling on 07/12/2016 10:51:42

## 2016-07-14 NOTE — Progress Notes (Signed)
[name], [name] ([id number]) Visit Report for 07/12/2016 Chief Complaint Document Details Patient Name: [name], [name]. Date of Service: 07/12/2016 10:30 AM Medical Record Number: [id number] Patient Account Number: [id number] Date of Birth/Sex: 10-12-1950 (66 y.o. Female) Treating RN: Ashok Cordia, [name] Primary Care Provider: Tonia Ghent Other Clinician: Referring Provider: Tonia Ghent Treating Provider/Extender: Kathreen Cosier in Treatment: 0 Information Obtained from: Patient Chief Complaint [name] presents today for initial evaluation of her left medial hallux and left lateral lower extremity ulcers Electronic Signature(s) Signed: 07/12/2016 1:02:33 PM By: Bonnell Public Entered By: Bonnell Public on 07/12/2016 13:02:33 Gutter, [name] -------------------------------------------------------------------------------- Debridement Details Patient Name: [name]. Date of Service: 07/12/2016 10:30 AM Medical Record Number: [id number] Patient Account Number: [id number] Date of Birth/Sex: 1950/06/04 (66 y.o. Female) Treating RN: Ashok Cordia, [name] Primary Care Provider: Tonia Ghent Other Clinician: Referring Provider: Tonia Ghent Treating Provider/Extender: Kathreen Cosier in Treatment: 0 Debridement Performed for Wound #3 Left,Medial Toe Great Assessment: Performed By: Physician Bonnell Public, NP Debridement: Debridement Pre-procedure Yes - 11:47 Verification/Time Out Taken: Start Time: 11:48 Pain Control: Lidocaine 4% Topical Solution Level: Skin/Subcutaneous Tissue Total Area Debrided (L x 3 (cm) x 2.5 (cm) = 7.5 (cm) W): Tissue and other Viable, Non-Viable, Exudate, Fibrin/Slough, Skin, Subcutaneous material debrided: Instrument: Blade, Forceps Specimen: Swab Number of Specimens 1 Taken: Bleeding: Minimum Hemostasis Achieved: Pressure End Time: 11:55 Procedural Pain: 0 Post Procedural Pain: 0 Response to Treatment: Procedure was tolerated  well Post Debridement Measurements of Total Wound Length: (cm) 3 Width: (cm) 2.5 Depth: (cm) 0.1 Volume: (cm) 0.589 Character of Wound/Ulcer Post Requires Further Debridement Debridement: Severity of Tissue Post Debridement: Fat layer exposed Post Procedure Diagnosis Same as Pre-procedure Electronic Signature(s) Signed: 07/12/2016 1:02:04 PM By: Donnie Mesa, Sherilyn J. ([id number]) Signed: 07/12/2016 4:12:19 PM By: Alejandro Mulling Entered By: Bonnell Public on 07/12/2016 13:02:04 [name], [name] -------------------------------------------------------------------------------- HPI Details Patient Name: [name]. Date of Service: 07/12/2016 10:30 AM Medical Record Number: [id number] Patient Account Number: [id number] Date of Birth/Sex: 1950-06-29 (66 y.o. Female) Treating RN: Ashok Cordia, [name] Primary Care Provider: Tonia Ghent Other Clinician: Referring Provider: Tonia Ghent Treating Provider/Extender: Kathreen Cosier in Treatment: 0 History of Present Illness Location: left medial hallux; LLE lateral Duration: left medial hallux-4 weeks; LLE lateral-2-1/2 weeks Timing: left medial hallux-pain with pressure/standing, ambulating, manipulation; LLE lateral-pain with manipulation Context: left medial hallux-originally was a crack in the skin; LLE lateral-traumatic injury with car door Modifying Factors: uncontrolled diabetes and smoking are exacerbating factors to wound healing HPI Description: 07/12/16- the patient arrives for initial evaluation of a left hallux and left lower extremity lateral ulcer. She states that the left hallux originally had a crack in the skin which she began to apply AandD ointment. She went to her PCP at [facility] on 4/4, while going into her appointment she struck her left lateral leg on the car door. At that appointment she was given a prescription for Bactrim and completed that on 4/14 with no adverse  reaction. She states that it made little to no improvement in her toe wound. She went back for a follow-up visit on 4/18 and was prescribed doxycycline for 10 days. She continues to apply AandD topical ointment along with soaking her foot in warm water. She is a diabetic, most recent A1c of 9% (I do not have records of this, this is per patient report). She is both on oral agents and insulin. She is a current smoker, approximately 0.5 ppd. She  denies any remote or recent evaluation regarding arterial or venous disease. Electronic Signature(s) Signed: 07/12/2016 1:11:15 PM By: Bonnell Public Entered By: Bonnell Public on 07/12/2016 13:11:15 [name], [name] -------------------------------------------------------------------------------- Physical Exam Details Patient Name: [name], [name] Date of Service: 07/12/2016 10:30 AM Medical Record Number: [id number] Patient Account Number: [id number] Date of Birth/Sex: 1950/07/16 (66 y.o. Female) Treating RN: Ashok Cordia, [name] Primary Care Provider: Tonia Ghent Other Clinician: Referring Provider: Tonia Ghent Treating Provider/Extender: Kathreen Cosier in Treatment: 0 Constitutional hypertensive. afebrile. well nourished; well developed; appears stated age;Marland Kitchen Respiratory non-labored respiratory effort. expiratory wheezes bilaterally. Cardiovascular S1 S2 with regular rate and rhythm. LLE- non-palpable DP and PT, palpable popliteal; audible doppler signals to DP and PT appear multiphasic; in clinic left ABI 0.94. LLE- no edema, warm to touch, foot cooler than leg, hairlessness, cap refill <3 seconds. Musculoskeletal ambulates with cane. Integumentary (Hair, Skin) LLE lateral- cluster ulcers, crust easily removed revealing slough base; Left hallux- large amount of non- adherent skin removed revealing a superficial layer of slough, no deeper structures probed, pain with manipulation, no peri ulcer erythema/edema/fluctuance/induraction,  no malodor. Psychiatric appears to make sound judgement and have accurate insight regarding healthcare. oriented to time, place, person and situation. calm, pleasant, conversive. Electronic Signature(s) Signed: 07/12/2016 1:15:10 PM By: Bonnell Public Entered By: Bonnell Public on 07/12/2016 13:15:10 [name], [name] ([id number]) -------------------------------------------------------------------------------- Physician Orders Details Patient Name: [name]. Date of Service: 07/12/2016 10:30 AM Medical Record Number: [id number] Patient Account Number: [id number] Date of Birth/Sex: Jul 07, 1950 (66 y.o. Female) Treating RN: Ashok Cordia, [name] Primary Care Provider: Tonia Ghent Other Clinician: Referring Provider: Tonia Ghent Treating Provider/Extender: Kathreen Cosier in Treatment: 0 Verbal / Phone Orders: Yes Clinician: [name], [name] Read Back and Verified: Yes Diagnosis Coding Wound Cleansing Wound #2 Left,Lateral Lower Leg o Clean wound with Normal Saline. o Cleanse wound with mild soap and water Wound #3 Left,Medial Toe Great o Clean wound with Normal Saline. o Cleanse wound with mild soap and water Anesthetic Wound #2 Left,Lateral Lower Leg o Topical Lidocaine 4% cream applied to wound bed prior to debridement Wound #3 Left,Medial Toe Great o Topical Lidocaine 4% cream applied to wound bed prior to debridement Skin Barriers/Peri-Wound Care Wound #2 Left,Lateral Lower Leg o Skin Prep Wound #3 Left,Medial Toe Great o Skin Prep Primary Wound Dressing Wound #2 Left,Lateral Lower Leg o Iodoflex Wound #3 Left,Medial Toe Great o Iodoflex Secondary Dressing Wound #2 Left,Lateral Lower Leg o Dry Gauze o Boardered Foam Dressing Wound #3 Left,Medial Toe Great Carriero, [name] ([id number]) o Dry Gauze o Gauze and Kerlix/Conform o Other - tape Dressing Change Frequency Wound #2 Left,Lateral Lower Leg o Change Dressing Monday, Wednesday,  Friday o Other: - PRN Wound #3 Left,Medial Toe Great o Change Dressing Monday, Wednesday, Friday o Other: - PRN Follow-up Appointments Wound #2 Left,Lateral Lower Leg o Return Appointment in 1 week. Wound #3 Left,Medial Toe Great o Return Appointment in 1 week. Edema Control Wound #2 Left,Lateral Lower Leg o Elevate legs to the level of the heart and pump ankles as often as possible Wound #3 Left,Medial Toe Great o Elevate legs to the level of the heart and pump ankles as often as possible Additional Orders / Instructions Wound #2 Left,Lateral Lower Leg o Stop Smoking o Increase protein intake. Wound #3 Left,Medial Toe Great o Stop Smoking o Increase protein intake. Laboratory o Bacteria identified in Wound by Culture (MICRO) oooo LOINC Code: [id number] oooo Convenience Name: Wound culture routine Radiology o X-ray, foot -  left SREENIDHI, GANSON ([id number]) Electronic Signature(s) Signed: 07/12/2016 1:15:36 PM By: Bonnell Public Entered By: Bonnell Public on 07/12/2016 13:15:35 Myren, [name] ([id number]) -------------------------------------------------------------------------------- Problem List Details Patient Name: LORINDA, COPLAND. Date of Service: 07/12/2016 10:30 AM Medical Record Number: [id number] Patient Account Number: [id number] Date of Birth/Sex: 1950-09-07 (66 y.o. 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 ([id number]) -------------------------------------------------------------------------------- Progress Note Details Patient Name: [name]. Date of Service: 07/12/2016 10:30 AM Medical Record Number: [id number] Patient Account Number: [id number] Date of Birth/Sex: [birth year] (66 y.o. Female) Treating RN: Ashok Cordia, [name] Primary Care Provider: Tonia Ghent Other Clinician: Referring Provider: Tonia Ghent Treating Provider/Extender: Kathreen Cosier in Treatment: 0 Subjective Chief Complaint Information obtained from Patient Mrs. Arteaga presents today for initial evaluation of her left medial hallux and left lateral lower extremity ulcers History of Present Illness (HPI) The following HPI elements were documented for the patient's wound: Location: left medial hallux; LLE lateral Duration: left medial hallux-4 weeks; LLE lateral-2-1/2 weeks Timing: left medial hallux-pain with pressure/standing, ambulating, manipulation; LLE lateral-pain with manipulation Context: left medial hallux-originally was a crack in the skin; LLE lateral-traumatic injury with car door Modifying Factors: uncontrolled diabetes and smoking are exacerbating factors to wound healing 07/12/16- the patient arrives for initial evaluation of a left hallux and left lower extremity lateral ulcer. She states that the left hallux originally had a crack in the skin which she began to apply AandD ointment. She went to her PCP at [facility] on 4/4, while going into her appointment she struck her left lateral leg on the car door. At that appointment she was  given a prescription for Bactrim and completed that on 4/14 with no adverse reaction. She states that it made little to no improvement in her toe wound. She went back for a follow-up visit on 4/18 and was prescribed doxycycline for 10 days. She continues to apply AandD topical ointment along with soaking her foot in warm water. She is a diabetic, most recent A1c of 9% (I do not have records of this, this is per patient report). She is both on oral agents and insulin. She is a current smoker, approximately 0.5 ppd. She denies any remote or recent evaluation regarding arterial or venous disease. Wound History Patient presents with 2 open wounds that have been present for approximately 2.5 weeks. Patient has been treating wounds in the following manner: AandD ointment and soaking it in water. Laboratory tests have not been performed in the last month. Patient reportedly has not tested positive for an antibiotic resistant organism. Patient reportedly has not tested positive for osteomyelitis. Patient reportedly has not had testing performed to evaluate circulation in the legs. Patient experiences the following problems associated with their wounds: infection, swelling. Patient History Information obtained from Patient. Allergies morphine,  hydrocodone, codeine, mercury based products, penicillin, bee stings Lollis, Ly J. ([id number]) Family History Cancer - Siblings, Diabetes - Father, Heart Disease, Hypertension - Father, Thyroid Problems - Mother, No family history of Kidney Disease, Lung Disease, Stroke. Social History Current every day smoker - 1.5 pack a day, Marital Status - Married, Alcohol Use - Never, Drug Use - No History, Caffeine Use - Daily. Medical History Neurologic Patient has history of Neuropathy Medical And Surgical History Notes Constitutional Symptoms (General Health) diabetic, enlarged heart, CHF, High Blood Pressure, ASthma, Stroke 2011 Review of Systems  (ROS) Constitutional Symptoms (General Health) Complains or has symptoms of Chills. Eyes Complains or has symptoms of Glasses / Contacts. Hematologic/Lymphatic The patient has no complaints or symptoms. Cardiovascular stroke 2011 Gastrointestinal The patient has no complaints or symptoms. Genitourinary The patient has no complaints or symptoms. Immunological The patient has no complaints or symptoms. Integumentary (Skin) Complains or has symptoms of Wounds. Oncologic The patient has no complaints or symptoms. Psychiatric depression she is a past medical and surgical history to include, but not limited to: Hypertension, type 2 diabetes, COPD, arthritis, anxiety Objective [name], [name] ([id number]) Constitutional hypertensive. afebrile. well nourished; well developed; appears stated age;Marland Kitchen Vitals Time Taken: 10:50 AM, Height: 63 in, Source: Stated, Weight: 224 lbs, Source: Measured, BMI: 39.7, Temperature: 98.4 F, Pulse: 100 bpm, Respiratory Rate: 20 breaths/min, Blood Pressure: 157/79 mmHg. Respiratory non-labored respiratory effort. expiratory wheezes bilaterally. Cardiovascular S1 S2 with regular rate and rhythm. LLE- non-palpable DP and PT, palpable popliteal; audible doppler signals to DP and PT appear multiphasic; in clinic left ABI 0.94. LLE- no edema, warm to touch, foot cooler than leg, hairlessness, cap refill Musculoskeletal ambulates with cane. Psychiatric appears to make sound judgement and have accurate insight regarding healthcare. oriented to time, place, person and situation. calm, pleasant, conversive. Integumentary (Hair, Skin) LLE lateral- cluster ulcers, crust easily removed revealing slough base; Left hallux- large amount of non- adherent skin removed revealing a superficial layer of slough, no deeper structures probed, pain with manipulation, no peri ulcer erythema/edema/fluctuance/induraction, no malodor. Wound #2 status is Open. Original cause of  wound was Trauma. The wound is located on the Left,Lateral Lower Leg. The wound measures 0.6cm length x 1.5cm width x 0.1cm depth; 0.707cm^2 area and 0.071cm^3 volume. The wound is limited to skin breakdown. There is no tunneling or undermining noted. There is a medium amount of serosanguineous drainage noted. The wound margin is distinct with the outline attached to the wound base. There is no granulation within the wound bed. There is a large (67- 100%) amount of necrotic tissue within the wound bed including Eschar and Adherent Slough. The periwound skin appearance exhibited: Dry/Scaly. Periwound temperature was noted as No Abnormality. The periwound has tenderness on palpation. Wound #3 status is Open. Original cause of wound was Gradually Appeared. The wound is located on the [name]. The wound measures 3cm length x 2.5cm width x 0.1cm depth; 5.89cm^2 area and 0.589cm^3 volume. The wound is limited to skin breakdown. There is no tunneling noted, however, there is undermining starting at 12:00 and ending at 4:00 with a maximum distance of 2.5cm. There is a medium amount of purulent drainage noted. The wound margin is distinct with the outline attached to the wound base. There is no granulation within the wound bed. There is a large (67-100%) amount of necrotic tissue within the wound bed including Eschar and Adherent Slough. The periwound skin appearance exhibited: Dry/Scaly. Periwound temperature was noted as No Abnormality. The  periwound has tenderness on palpation. [name], [name] Assessment Active Problems ICD-10 E11.621 - Type 2 diabetes mellitus with foot ulcer E11.622 - Type 2 diabetes mellitus with other skin ulcer L97.511 - Non-pressure chronic ulcer of other part of right foot limited to breakdown of skin L97.212 - Non-pressure chronic ulcer of right calf with fat layer exposed F17.218 - Nicotine dependence, cigarettes, with other nicotine-induced  disorders - >3-<10 minutes spent on education regarding smoking cessation. All risks discussed regarding overall health and wound healing, especially in combination with uncontrolled diabetes. She was advised to seek help from her PCP for medicinal assistance if needed for complete cessation. We will reevaluate at weekly intervals. She states her goal is for complete cessation - Uncontrolled diabetes with recent A1c of 9%. She was educated on the need to titrate her glucose levels below 150 as this will improve her likelihood of healing and decrease her risk of infection - She was informed to minimize ambulation and any pressure to the left hallux - Although the wound appears superficial we will perform an x-ray to rule out any deeper structure involvement since this has been present for 4 weeks Procedures Wound #3 Wound #3 is a Diabetic Wound/Ulcer of the Lower Extremity located on the Left,Medial Toe Great . There was a Skin/Subcutaneous Tissue Debridement ([id number]) debridement with total area of 7.5 sq cm performed by Bonnell Public, NP. with the following instrument(s): Blade and Forceps to remove Viable and Non-Viable tissue/material including Exudate, Fibrin/Slough, Skin, and Subcutaneous after achieving pain control using Lidocaine 4% Topical Solution. 1 Specimen was taken by a Swab and sent to the lab per facility protocol.A time out was conducted at 11:47, prior to the start of the procedure. A Minimum amount of bleeding was controlled with Pressure. The procedure was tolerated well with a pain level of 0 throughout and a pain level of 0 following the procedure. Post Debridement Measurements: 3cm length x 2.5cm width x 0.1cm depth; 0.589cm^3 volume. Character of Wound/Ulcer Post Debridement requires further debridement. Severity of Tissue Post Debridement is: Fat layer exposed. Post procedure Diagnosis Wound #3: Same as Pre-Procedure Klooster, Maille J. ([id number]) Plan Wound  Cleansing: Wound #2 Left,Lateral Lower Leg: Clean wound with Normal Saline. Cleanse wound with mild soap and water Wound #3 Left,Medial Toe Great: Clean wound with Normal Saline. Cleanse wound with mild soap and water Anesthetic: Wound #2 Left,Lateral Lower Leg: Topical Lidocaine 4% cream applied to wound bed prior to debridement Wound #3 Left,Medial Toe Great: Topical Lidocaine 4% cream applied to wound bed prior to debridement Skin Barriers/Peri-Wound Care: Wound #2 Left,Lateral Lower Leg: Skin Prep Wound #3 Left,Medial Toe Great: Skin Prep Primary Wound Dressing: Wound #2 Left,Lateral Lower Leg: Iodoflex Wound #3 Left,Medial Toe Great: Iodoflex Secondary Dressing: Wound #2 Left,Lateral Lower Leg: Dry Gauze Boardered Foam Dressing Wound #3 Left,Medial Toe Great: Dry Gauze Gauze and Kerlix/Conform Other - tape Dressing Change Frequency: Wound #2 Left,Lateral Lower Leg: Change Dressing Monday, Wednesday, Friday Other: - PRN Wound #3 Left,Medial Toe Great: Change Dressing Monday, Wednesday, Friday Other: - PRN Follow-up Appointments: Wound #2 Left,Lateral Lower Leg: Return Appointment in 1 week. Wound #3 Left,Medial Toe Great: Return Appointment in 1 week. Edema Control: Wound #2 Left,Lateral Lower Leg: Elevate legs to the level of the heart and pump ankles as often as possible Wound #3 Left,Medial Toe Great: Elevate legs to the level of the heart and pump ankles as often as possible [name], [name] ([id number]) Additional Orders / Instructions: Wound #2 Left,Lateral  Lower Leg: Stop Smoking Increase protein intake. Wound #3 Left,Medial Toe Great: Stop Smoking Increase protein intake. Radiology ordered were: X-ray, foot - left Laboratory ordered were: Wound culture routine 1. iodosorb/iodoflex to all areas; change three times weekly and PRN-drainage 2. offload/limit activity for left hallux 3. decrease smoking with goal of complete cessation 4. xray  today 5. culture obtained today; anitiotic therapy per culture and sensitivity (currently on doxycycline) Electronic Signature(s) Signed: 07/12/2016 1:54:01 PM By: Bonnell Public Previous Signature: 07/12/2016 1:16:18 PM Version By: Bonnell Public Entered By: Bonnell Public on 07/12/2016 13:54:01 [name], [name] -------------------------------------------------------------------------------- ROS/PFSH Details Patient Name: [name]. Date of Service: 07/12/2016 10:30 AM Medical Record Number: [id number] Patient Account Number: [id number] Date of Birth/Sex: [birth year] (66 y.o. Female) Treating RN: Ashok Cordia, [name] Primary Care Provider: Tonia Ghent Other Clinician: Referring Provider: Tonia Ghent Treating Provider/Extender: Kathreen Cosier in Treatment: 0 Information Obtained From Patient Wound History Do you currently have one or more open woundso Yes How many open wounds do you currently haveo 2 Approximately how long have you had your woundso 2.5 weeks How have you been treating your wound(s) until nowo AandD ointment and soaking it in water Has your wound(s) ever healed and then re-openedo No Have you had any lab work done in the past montho No Have you tested positive for an antibiotic resistant organism No (MRSA, VRE)o Have you tested positive for osteomyelitis (bone infection)o No Have you had any tests for circulation on your legso No Have you had other problems associated with your woundso Infection, Swelling Constitutional Symptoms (General Health) Complaints and Symptoms: Positive for: Chills Medical History: Past Medical History Notes: diabetic, enlarged heart, CHF, High Blood Pressure, ASthma, Stroke 2011 Eyes Complaints and Symptoms: Positive for: Glasses / Contacts Integumentary (Skin) Complaints and Symptoms: Positive for: Wounds Hematologic/Lymphatic Complaints and Symptoms: No Complaints or Symptoms Respiratory Pokorny, Cristianna J.  ([id number]) Medical History: Positive for: Asthma; Chronic Obstructive Pulmonary Disease (COPD) Cardiovascular Complaints and Symptoms: Review of System Notes: stroke 2011 Medical History: Positive for: Congestive Heart Failure; Hypertension Gastrointestinal Complaints and Symptoms: No Complaints or Symptoms Endocrine Medical History: Positive for: Type II Diabetes Time with diabetes: 1985 Treated with: Insulin, Oral agents Blood sugar tested every day: Yes Tested : Blood sugar testing results: Breakfast: 120 Genitourinary Complaints and Symptoms: No Complaints or Symptoms Immunological Complaints and Symptoms: No Complaints or Symptoms Musculoskeletal Medical History: Positive for: Osteoarthritis Neurologic Medical History: Positive for: Neuropathy Oncologic Complaints and Symptoms: No Complaints or Symptoms Filice, Laurynn J. ([id number]) Psychiatric Complaints and Symptoms: Review of System Notes: depression Immunizations Pneumococcal Vaccine: Received Pneumococcal Vaccination: No Family and Social History Cancer: Yes - Siblings; Diabetes: Yes - Father; Heart Disease: Yes; Hypertension: Yes - Father; Kidney Disease: No; Lung Disease: No; Stroke: No; Thyroid Problems: Yes - Mother; Current every day smoker - 1.5 pack a day; Marital Status - Married; Alcohol Use: Never; Drug Use: No History; Caffeine Use: Daily; Financial Concerns: No; Food, Clothing or Shelter Needs: No; Support System Lacking: No; Transportation Concerns: No; Advanced Directives: No; Patient does not want information on Advanced Directives; Do not resuscitate: No; Living Will: No; Medical Power of Attorney: No Electronic Signature(s) Signed: 07/12/2016 2:28:52 PM By: Bonnell Public Signed: 07/12/2016 4:12:19 PM By: Alejandro Mulling Entered By: Alejandro Mulling on 07/12/2016 10:58:10 Niccoli, [name]  ([id number]) -------------------------------------------------------------------------------- SuperBill Details Patient Name: [name]. Date of Service: 07/12/2016 Medical Record Number: [id number] Patient Account Number: [id number] Date of Birth/Sex: 1950/12/30 (66 y.o. 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 F17.218 Nicotine dependence, cigarettes, with other nicotine-induced disorders Facility Procedures CPT4 Code Description: 16109604 99213 - WOUND CARE VISIT-LEV 3 EST PT Modifier: Quantity: 1 CPT4 Code Description: 54098119 11042 - DEB SUBQ TISSUE 20 SQ CM/< ICD-10 Description Diagnosis 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 2 diabetes mellitus with foot ulcer Modifier: Quantity: 1 CPT4 Code Description: 14782956 99406-SMOKING CESSATION 3-10MINS ICD-10 Description Diagnosis F17.218 Nicotine dependence, cigarettes, with other nicotine- Modifier: induced disor Quantity: 1 ders Physician Procedures CPT4 Code Description: 2130865 99213 - WC PHYS LEVEL 3 - EST PT ICD-10 Description Diagnosis 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 2 diabetes mellitus with foot ulcer E11.622 Type 2 diabetes mellitus with other skin ulcer Modifier: 25 Quantity: 1 CPT4 Code Description: 7846962 11042 - WC PHYS SUBQ TISS 20 SQ CM ICD-10 Description Diagnosis 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 2 diabetes mellitus with foot ulcer Caras, Floyd J. ([id number]) Modifier: Quantity: 1 Electronic Signature(s) Signed: 07/12/2016 2:28:52 PM By: Bonnell Public Signed: 07/12/2016 4:12:19 PM By: Alejandro Mulling Previous Signature:  07/12/2016 1:54:37 PM Version By: Bonnell Public Entered By: Alejandro Mulling on 07/12/2016 13:57:25

## 2016-07-15 LAB — AEROBIC CULTURE W GRAM STAIN (SUPERFICIAL SPECIMEN)

## 2016-07-15 LAB — AEROBIC CULTURE  (SUPERFICIAL SPECIMEN): CULTURE: NORMAL

## 2016-07-19 ENCOUNTER — Encounter: Payer: Medicare HMO | Admitting: Physician Assistant

## 2016-07-19 DIAGNOSIS — E11621 Type 2 diabetes mellitus with foot ulcer: Secondary | ICD-10-CM | POA: Diagnosis not present

## 2016-07-21 NOTE — Progress Notes (Signed)
[name] Visit Report for 07/19/2016 Chief Complaint Document Details Patient Name: [name]. Date of Service: 07/19/2016 2:30 PM Medical Record Number: [id number] Patient Account Number: [id number] Date of Birth/Sex: 03/16/1951 (66 y.o. Female) Treating RN: Ashok Cordia, [name] Primary Care Provider: Tonia Ghent Other Clinician: Referring Provider: Tonia Ghent Treating Provider/Extender: Linwood Dibbles, HOYT Weeks in Treatment: 1 Information Obtained from: Patient Chief Complaint [name] presents today for evaluation of her left medial hallux and left lateral lower extremity ulcers Electronic Signature(s) Signed: 07/19/2016 5:05:38 PM By: Lenda Kelp PA-C Entered By: Lenda Kelp on 07/19/2016 15:12:34 [name] ([id number]) -------------------------------------------------------------------------------- HPI Details Patient Name: [name]. Date of Service: 07/19/2016 2:30 PM Medical Record Number: [id number] Patient Account Number: [id number] Date of Birth/Sex: 01-01-1951 (66 y.o. Female) Treating RN: Ashok Cordia, [name] Primary Care Provider: Tonia Ghent Other Clinician: Referring Provider: Tonia Ghent Treating Provider/Extender: Linwood Dibbles, HOYT Weeks in Treatment: 1 History of Present Illness Location: left medial hallux; LLE lateral Duration: left medial hallux-4 weeks; LLE lateral-2-1/2 weeks Timing: left medial hallux-pain with pressure/standing, ambulating, manipulation; LLE lateral-pain with manipulation Context: left medial hallux-originally was a crack in the skin; LLE lateral-traumatic injury with car door Modifying Factors: uncontrolled diabetes and smoking are exacerbating factors to wound healing HPI Description: 07/12/16- the patient arrives for initial evaluation of a left hallux and left lower extremity lateral ulcer. She states that the left hallux originally had a crack in the skin which she began to apply AandD ointment. She  went to her PCP at [facility] on 4/4, while going into her appointment she struck her left lateral leg on the car door. At that appointment she was given a prescription for Bactrim and completed that on 4/14 with no adverse reaction. She states that it made little to no improvement in her toe wound. She went back for a follow-up visit on 4/18 and was prescribed doxycycline for 10 days. She continues to apply AandD topical ointment along with soaking her foot in warm water. She is a diabetic, most recent A1c of 9% (I do not have records of this, this is per patient report). She is both on oral agents and insulin. She is a current smoker, approximately 0.5 ppd. She denies any remote or recent evaluation regarding arterial or venous disease. 07/19/16 Patient presents today for follow-up evaluation concerning her left great toe wound immediately and left lateral lower extremity wound. Unfortunately though both wounds appear better visually she is having a lot of discomfort. I'm unsure if this is a preformed drop of the issue or potentially the ideas or appointment is causing irritation which has caused her discomfort and pain. The good news is her wound culture came back negative and showed only normal skin flora and her x-ray of the left foot was also negative for any osseous change or osteomyelitis. Unfortunately she continues to have a lot of discomfort she would rate it a seven out of 10 right now. Electronic Signature(s) Signed: 07/19/2016 5:05:38 PM By: Lenda Kelp PA-C Entered By: Lenda Kelp on 07/19/2016 15:15:23 [name] ([id number]) -------------------------------------------------------------------------------- Physical Exam Details Patient Name: [name]. Date of Service: 07/19/2016 2:30 PM Medical Record Number: [id number] Patient Account Number: [id number] Date of Birth/Sex: [birth year] (66 y.o. Female) Treating RN: Ashok Cordia, [name] Primary Care  Provider: Tonia Ghent Other Clinician: Referring Provider: Tonia Ghent Treating Provider/Extender: STONE III, HOYT Weeks in Treatment: 1 Constitutional Well-nourished and well-hydrated in no acute distress. Respiratory normal breathing  without difficulty. clear to auscultation bilaterally. Cardiovascular regular rate and rhythm with normal S1, S2. 1+ dorsalis pedis/posterior tibialis pulses. Psychiatric this patient is able to make decisions and demonstrates good insight into disease process. Alert and Oriented x 3. pleasant and cooperative. Electronic Signature(s) Signed: 07/19/2016 5:05:38 PM By: Lenda Kelp PA-C Entered By: Lenda Kelp on 07/19/2016 15:17:58 [name] ([id number]) -------------------------------------------------------------------------------- Physician Orders Details Patient Name: [name]. Date of Service: 07/19/2016 2:30 PM Medical Record Number: [id number] Patient Account Number: [id number] Date of Birth/Sex: 15-Aug-1950 (66 y.o. 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 o Cleanse wound with mild soap and water Wound #3 Left,Medial Toe Great o Clean wound with Normal Saline. o Cleanse wound with mild soap and  water Anesthetic Wound #2 Left,Lateral Lower Leg o Topical Lidocaine 4% cream applied to wound bed prior to debridement Wound #3 Left,Medial Toe Great o Topical Lidocaine 4% cream applied to wound bed prior to debridement Skin Barriers/Peri-Wound Care Wound #2 Left,Lateral Lower Leg o Skin Prep Wound #3 Left,Medial Toe Great o Skin Prep Primary Wound Dressing Wound #2 Left,Lateral Lower Leg o Santyl Ointment Spang, Vianka J. ([id number]) Wound #3 Left,Medial Toe Great o Santyl Ointment Secondary Dressing Wound #2 Left,Lateral Lower Leg o Dry Gauze o Boardered Foam Dressing Wound #3 Left,Medial Toe Great o Dry Gauze o Gauze and Kerlix/Conform o Other - tape Dressing Change Frequency Wound #2 Left,Lateral Lower Leg o Change Dressing Monday, Wednesday, Friday o Other: - PRN Wound #3 Left,Medial Toe Great o Change Dressing Monday, Wednesday, Friday o Other: - PRN Follow-up Appointments Wound #2 Left,Lateral Lower Leg o Return Appointment in 1 week. Wound #3 Left,Medial Toe Great o Return Appointment in 1 week. Edema Control Wound #2 Left,Lateral Lower Leg o Elevate legs to the level of the heart and pump ankles as often as possible Wound #3 Left,Medial Toe Great o Elevate legs to the level of the heart and pump ankles as often as possible Additional Orders / Instructions Wound #2 Left,Lateral Lower Leg o Stop Smoking o Increase protein intake. Wound #3 Left,Medial Toe Great o Stop Smoking o Increase protein intake. Medications-please add to medication list. CHRISY, HILLEBRAND ([id number]) Wound #2 Left,Lateral Lower Leg o Santyl Enzymatic Ointment Wound #3 Left,Medial Toe Great o Santyl Enzymatic Ointment Patient Medications Allergies: morphine, hydrocodone, codeine, mercury based products, penicillin, bee stings Notifications Medication Indication Start End Santyl 07/19/2016 DOSE topical 250 unit/gram ointment -  ointment topical applied nickel thick and applied daily Electronic Signature(s) Signed: 07/19/2016 5:05:38 PM By: Lenda Kelp PA-C Entered By: Lenda Kelp on 07/19/2016 15:23:13 Bresnan, Mardene [name] ([id number]) -------------------------------------------------------------------------------- Prescription 07/19/2016 Patient Name: [name] Provider: Lenda Kelp PA-C Date of Birth: 1950/06/12 NPI#: [id number] Sex: F DEA#: [id number] Phone #: [phone] License #: Patient Address: [facility] [address] [place], [place] [address] [facility] [address]  [address] [place], [place] [address] [phone] Allergies morphine hydrocodone codeine mercury based products penicillin bee stings Medication Medication: Route: Strength: Form: Santyl topical 250 unit/gram ointment Class: TOPICAL/MUCOUS MEMBR./SUBCUT. ENZYMES Dose: Frequency / Time: Indication: ointment topical applied nickel thick and applied daily Number of Refills: Number of Units: 0 Thirty (30) Gram(s) Generic Substitution: Start Date: End Date: Administered at Substitution Permitted 07/19/2016 Facility: No Note to Pharmacy: BEVERLYANN, BROXTERMAN ([id number]) Manufacturer's Note: The total quantity has been calculated in grams due to Santyl being available in either 30 or 90 gram tubes. Signature(s): Date(s): Electronic Signature(s) Signed: 07/19/2016 5:05:38 PM By: Lenda Kelp PA-C Entered By: Lenda Kelp on 07/19/2016 15:23:14 Aispuro, Mardene [name] ([id number]) --------------------------------------------------------------------------------  Problem List Details Patient Name: TASHICA, PROVENCIO. Date of Service: 07/19/2016 2:30 PM Medical Record Number: [id number] Patient Account Number: [id number] Date of Birth/Sex: [birth year] (66 y.o. Female) Treating RN: Ashok Cordia, [name] Primary Care Provider: Tonia Ghent Other Clinician: Referring  Provider: Tonia Ghent Treating Provider/Extender: Linwood Dibbles, HOYT Weeks in Treatment: 1 Active Problems ICD-10 Encounter Code Description Active Date Diagnosis 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 2 diabetes mellitus with foot ulcer 07/12/2016 Yes E11.622 Type 2 diabetes mellitus with other skin ulcer 07/12/2016 Yes L97.511 Non-pressure chronic ulcer of other part of right foot 07/12/2016 Yes limited to breakdown of skin L97.212 Non-pressure chronic ulcer of right calf with fat layer 07/12/2016 Yes exposed F17.218 Nicotine dependence, cigarettes, with other nicotine- 07/12/2016 Yes induced disorders Inactive Problems Resolved Problems Electronic Signature(s) Signed: 07/19/2016 5:05:38 PM By: Lenda Kelp PA-C Entered By: Lenda Kelp on 07/19/2016 14:53:23 Belitz, Alizea J. ([id number]) -------------------------------------------------------------------------------- Progress Note Details Patient Name: [name]. Date of Service: 07/19/2016 2:30 PM Medical Record Number: [id number] Patient Account Number: [id number] Date of Birth/Sex: [birth year] (66 y.o. Female) Treating RN: Ashok Cordia, [name] Primary Care Provider: Tonia Ghent Other Clinician: Referring Provider: Tonia Ghent Treating Provider/Extender: Linwood Dibbles, HOYT Weeks in Treatment: 1 Subjective Chief Complaint Information obtained from Patient Mrs. Goings presents today for evaluation of her left medial hallux and left lateral lower extremity ulcers History of Present Illness (HPI) The following HPI elements were documented for the patient's wound: Location: left medial hallux; LLE lateral Duration: left medial hallux-4 weeks; LLE lateral-2-1/2 weeks Timing: left medial hallux-pain with pressure/standing, ambulating, manipulation; LLE lateral-pain with manipulation Context: left medial hallux-originally was a crack in the skin; LLE lateral-traumatic injury with car door Modifying Factors: uncontrolled diabetes and smoking are exacerbating  factors to wound healing 07/12/16- the patient arrives for initial evaluation of a left hallux and left lower extremity lateral ulcer. She states that the left hallux originally had a crack in the skin which she began to apply AandD ointment. She went to her PCP at [facility] on 4/4, while going into her appointment she struck her left lateral leg on the car door. At that appointment she was given a prescription for Bactrim and completed that on 4/14 with no adverse reaction. She states that it made little to no improvement in her toe wound. She went back for a follow-up visit on 4/18 and was prescribed doxycycline for 10 days. She continues to apply AandD topical ointment along with soaking her foot in warm water. She is a diabetic, most recent A1c of 9% (I do not have records of this, this is per patient report). She is both on oral agents and insulin. She is a current smoker, approximately 0.5 ppd. She denies any remote or recent evaluation regarding arterial or venous disease. 07/19/16  Patient presents today for follow-up evaluation concerning her left great toe wound immediately and left lateral lower extremity wound. Unfortunately though both wounds appear better visually she is having a lot of discomfort. I'm unsure if this is a preformed drop of the issue or potentially the ideas or appointment is causing irritation which has caused her discomfort and pain. The good news is her wound culture came back negative and showed only normal skin flora and her x-ray of the left foot was also negative for any osseous change or osteomyelitis. Unfortunately she continues to have a lot of discomfort she would rate it a seven out of 10 right now. Objective Cumpian, Jasani J. ([id number]) Constitutional Well-nourished and well-hydrated in no acute distress. Vitals Time Taken: 2:34 PM, Height: 63 in, Weight: 224 lbs, BMI: 39.7, Temperature: 98.3 F, Pulse: 85 bpm, Respiratory Rate: 20  breaths/min, Blood Pressure: 149/72 mmHg. Respiratory normal breathing without difficulty. clear to auscultation bilaterally. Cardiovascular regular rate and rhythm with normal S1, S2. 1+ dorsalis pedis/posterior tibialis pulses. Psychiatric this patient is able to make decisions and demonstrates good insight into disease process. Alert and Oriented x 3. pleasant and cooperative. Integumentary (Hair, Skin) Wound #2 status is Open. Original cause of wound was Trauma. The wound is located on the Left,Lateral Lower Leg. The wound measures 0.6cm length x 1.6cm width x 0.1cm depth; 0.754cm^2 area and 0.075cm^3 volume. The wound is limited to skin breakdown. There is no tunneling or undermining noted. There is a medium amount of serosanguineous drainage noted. The wound margin is distinct with the outline attached to the wound base. There is no granulation within the wound bed. There is a large (67- 100%) amount of necrotic tissue within the wound bed including Eschar and Adherent Slough. The periwound skin appearance exhibited: Dry/Scaly. Periwound temperature was noted as No Abnormality. The periwound has tenderness on palpation. Wound #3 status is Open. Original cause of wound was Gradually Appeared. The wound is located on the [employer]. The wound measures 2cm length x 0.9cm width x 0.1cm depth; 1.414cm^2 area and 0.141cm^3 volume. The wound is limited to skin breakdown. There is no tunneling or undermining noted. There is a medium amount of purulent drainage noted. The wound margin is distinct with the outline attached to the wound base. There is small (1-33%) red granulation within the wound bed. There is a large (67-100%) amount of necrotic tissue within the wound bed including Eschar and Adherent Slough. The periwound skin appearance exhibited: Dry/Scaly. Periwound temperature was noted as No Abnormality. The periwound has tenderness on palpation. Assessment Active  Problems ICD-10 E11.621 - Type 2 diabetes mellitus with foot ulcer E11.622 - Type 2 diabetes mellitus with other skin ulcer L97.511 - Non-pressure chronic ulcer of other part of right foot limited to breakdown of skin Thetford, Pharrah J. ([id number]) N82.956 - Non-pressure chronic ulcer of right calf with fat layer exposed F17.218 - Nicotine dependence, cigarettes, with other nicotine-induced disorders Plan Wound Cleansing: Wound #2 Left,Lateral Lower Leg: Clean wound with Normal Saline. Cleanse wound with mild soap and water Wound #3 Left,Medial Toe Great: Clean wound with Normal Saline. Cleanse wound with mild soap and water Anesthetic: Wound #2 Left,Lateral Lower Leg: Topical Lidocaine 4% cream applied to wound bed prior to debridement Wound #3 Left,Medial Toe Great: Topical Lidocaine 4% cream applied to wound bed prior to debridement Skin Barriers/Peri-Wound Care: Wound #2 Left,Lateral Lower Leg: Skin Prep Wound #3 Left,Medial Toe Great: Skin Prep Primary Wound Dressing: Wound #2 Left,Lateral Lower Leg:  Santyl Ointment Wound #3 Left,Medial Toe Great: Santyl Ointment Secondary Dressing: Wound #2 Left,Lateral Lower Leg: Dry Gauze Boardered Foam Dressing Wound #3 Left,Medial Toe Great: Dry Gauze Gauze and Kerlix/Conform Other - tape Dressing Change Frequency: Wound #2 Left,Lateral Lower Leg: Change Dressing Monday, Wednesday, Friday Other: - PRN Wound #3 Left,Medial Toe Great: Change Dressing Monday, Wednesday, Friday Other: - PRN Follow-up Appointments: Wound #2 Left,Lateral Lower Leg: Return Appointment in 1 week. SENAI, RAMNATH ([id number]) Wound #3 Left,Medial Toe Great: Return Appointment in 1 week. Edema Control: Wound #2 Left,Lateral Lower Leg: Elevate legs to the level of the heart and pump ankles as often as possible Wound #3 Left,Medial Toe Great: Elevate legs to the level of the heart and pump ankles as often as possible Additional Orders /  Instructions: Wound #2 Left,Lateral Lower Leg: Stop Smoking Increase protein intake. Wound #3 Left,Medial Toe Great: Stop Smoking Increase protein intake. Medications-please add to medication list.: Wound #2 Left,Lateral Lower Leg: Santyl Enzymatic Ointment Wound #3 Left,Medial Toe Great: Santyl Enzymatic Ointment The following medication(s) was prescribed: Santyl topical 250 unit/gram ointment ointment topical applied nickel thick and applied daily starting 07/19/2016 At this point in time I'm concerned that the Iodosorb may be causing trouble for this patient. Both wounds were significantly more tender than would have been expected based on visual examination. For that reason I'm going to discontinue the previous orders and we will initiate Santyl for both wounds. Hopefully this will help improve her overall discomfort. We will see her for reevaluation in one week. If anything worsens in the interim she will contact the office for additional recommendations. Electronic Signature(s) Signed: 07/19/2016 5:05:38 PM By: Lenda Kelp PA-C Entered By: Lenda Kelp on 07/19/2016 15:26:01 Halbert, Mardene [name] ([id number]) -------------------------------------------------------------------------------- SuperBill Details Patient Name: [name] Date of Service: 07/19/2016 Medical Record Number: [id number] Patient Account Number: [id number] Date of Birth/Sex: [birth year] (66 y.o. 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 F17.218  Nicotine dependence, cigarettes, with other nicotine-induced disorders Facility Procedures CPT4 Code: 08657846 Description: 99213 - WOUND CARE VISIT-LEV 3 EST PT Modifier: Quantity: 1 Physician Procedures CPT4: Description Modifier Quantity Code [id number] 99213 - WC PHYS LEVEL 3 - EST PT 1 ICD-10 Description Diagnosis 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 2 diabetes mellitus with foot ulcer E11.622 Type 2 diabetes mellitus with other skin ulcer L97.511 Non-pressure chronic ulcer of  other part of right foot limited to breakdown of skin L97.212 Non-pressure chronic ulcer of right calf with fat layer exposed Electronic Signature(s) Signed: 07/19/2016 4:51:20 PM By: Alejandro Mulling Signed: 07/19/2016 5:05:38 PM By: Lenda Kelp PA-C Entered By: Alejandro Mulling on 07/19/2016 16:50:02

## 2016-07-21 NOTE — Progress Notes (Signed)
MAKINA, SKOW ([id number]) Visit Report for 07/19/2016 Arrival Information Details Patient Name: [name], [name]. Date of Service: 07/19/2016 2:30 PM Medical Record Number: [id number] Patient Account Number: [id number] Date of Birth/Sex: 1950/06/27 (66 y.o. Female) Treating RN: Ashok Cordia, [name] Primary Care Jamarii Banks: Tonia Ghent Other Clinician: Referring Daphane Odekirk: Tonia Ghent Treating Naomee Nowland/Extender: Linwood Dibbles, HOYT Weeks in Treatment: 1 Visit Information History Since Last Visit All ordered tests and consults were completed: No Patient Arrived: Wheel Chair Added or deleted any medications: No Arrival Time: 14:33 Any new allergies or adverse reactions: No Accompanied By: self Had a fall or experienced change in No Transfer Assistance: EasyPivot activities of daily living that may affect Patient Lift risk of falls: Patient Identification Verified: Yes Signs or symptoms of abuse/neglect since last No Secondary Verification Process Yes visito Completed: Hospitalized since last visit: No Patient Requires Transmission- No Has Dressing in Place as Prescribed: Yes Based Precautions: Pain Present Now: Yes Patient Has Alerts: Yes Patient Alerts: Dm II Electronic Signature(s) Signed: 07/19/2016 4:51:20 PM By: Alejandro Mulling Entered By: Alejandro Mulling on 07/19/2016 14:33:31 Berhane, Liliann J. ([id number]) -------------------------------------------------------------------------------- Clinic Level of Care Assessment Details Patient Name: [name]. Date of Service: 07/19/2016 2:30 PM Medical Record Number: [id number] Patient Account Number: [id number] Date of Birth/Sex: May 12, 1950 (66 y.o. Female) Treating RN: Ashok Cordia, [name] Primary Care Kayshaun Polanco: Tonia Ghent Other Clinician: Referring Camiah Humm: Tonia Ghent Treating Arieon Corcoran/Extender: Linwood Dibbles, HOYT Weeks in Treatment: 1 Clinic Level of Care Assessment Items TOOL 4 Quantity Score X - Use when only an EandM is  performed on FOLLOW-UP visit 1 0 ASSESSMENTS - Nursing Assessment / Reassessment X - Reassessment of Co-morbidities (includes updates in patient status) 1 10 X - Reassessment of Adherence to Treatment Plan 1 5 ASSESSMENTS - Wound and Skin Assessment / Reassessment  - Simple Wound Assessment / Reassessment - one wound 0 X - Complex Wound Assessment / Reassessment - multiple wounds 2 5  - Dermatologic / Skin Assessment (not related to wound area) 0 ASSESSMENTS - Focused Assessment  - Circumferential Edema Measurements - multi extremities 0  - Nutritional Assessment / Counseling / Intervention 0  - Lower Extremity Assessment (monofilament, tuning fork, pulses) 0  - Peripheral Arterial Disease Assessment (using hand held doppler) 0 ASSESSMENTS - Ostomy and/or Continence Assessment and Care  - Incontinence Assessment and Management 0  - Ostomy Care Assessment and Management (repouching, etc.) 0 PROCESS - Coordination of Care  - Simple Patient / Family Education for ongoing care 0 X - Complex (extensive) Patient / Family Education for ongoing care 1 20  - Staff obtains Chiropractor, Records, Test Results / Process Orders 0  - Staff telephones HHA, Nursing Homes / Clarify orders / etc 0  - Routine Transfer to another Facility (non-emergent condition) 0 Swamy, Vastie J. ([id number])  - Routine Hospital Admission (non-emergent condition) 0  - New Admissions / Manufacturing engineer / Ordering NPWT, Apligraf, etc. 0  - Emergency Hospital Admission (emergent condition) 0 X - Simple Discharge Coordination 1 10  - Complex (extensive) Discharge Coordination 0 PROCESS - Special Needs  - Pediatric / Minor Patient Management 0  - Isolation Patient Management 0  - Hearing / Language / Visual special needs 0  - Assessment of Community assistance (transportation, D/C planning, etc.) 0  - Additional assistance / Altered mentation 0  - Support Surface(s) Assessment  (bed, cushion, seat, etc.) 0 INTERVENTIONS - Wound Cleansing / Measurement  - Simple Wound Cleansing - one wound 0 X - Complex Wound Cleansing -  multiple wounds 2 5 X - Wound Imaging (photographs - any number of wounds) 1 5  - Wound Tracing (instead of photographs) 0  - Simple Wound Measurement - one wound 0 X - Complex Wound Measurement - multiple wounds 2 5 INTERVENTIONS - Wound Dressings X - Small Wound Dressing one or multiple wounds 2 10  - Medium Wound Dressing one or multiple wounds 0  - Large Wound Dressing one or multiple wounds 0 X - Application of Medications - topical 1 5  - Application of Medications - injection 0 INTERVENTIONS - Miscellaneous  - External ear exam 0 [name], [name] ([id number])  - Specimen Collection (cultures, biopsies, blood, body fluids, etc.) 0  - Specimen(s) / Culture(s) sent or taken to Lab for analysis 0  - Patient Transfer (multiple staff / Michiel Sites Lift / Similar devices) 0  - Simple Staple / Suture removal (25 or less) 0  - Complex Staple / Suture removal (26 or more) 0  - Hypo / Hyperglycemic Management (close monitor of Blood Glucose) 0  - Ankle / Brachial Index (ABI) - do not check if billed separately 0 X - Vital Signs 1 5 Has the patient been seen at the hospital within the last three years: Yes Total Score: 110 Level Of Care: New/Established - Level 3 Electronic Signature(s) Signed: 07/19/2016 4:51:20 PM By: Alejandro Mulling Entered By: Alejandro Mulling on 07/19/2016 16:49:55 [name], [name] ([id number]) -------------------------------------------------------------------------------- Encounter Discharge Information Details Patient Name: [name]. Date of Service: 07/19/2016 2:30 PM Medical Record Number: [id number] Patient Account Number: [id number] Date of Birth/Sex: 08/15/1950 (66 y.o. Female) Treating RN: Ashok Cordia, [name] Primary Care Shakeena Kafer: Tonia Ghent Other Clinician: Referring Emmalin Jaquess: Tonia Ghent Treating Deshannon Hinchliffe/Extender: Linwood Dibbles, HOYT Weeks in Treatment: 1 Encounter Discharge Information Items Discharge Pain Level: 0 Discharge Condition: Stable Ambulatory Status: Wheelchair Discharge Destination: Home Transportation: Private Auto Accompanied By: self Schedule Follow-up Appointment: Yes Medication Reconciliation completed and provided to Patient/Care No Omega Durante: Provided on Clinical Summary of Care: 07/19/2016 Form Type Recipient Paper Patient EP Electronic Signature(s) Signed: 07/19/2016 3:28:04 PM By: Gwenlyn Perking Entered By: Gwenlyn Perking on 07/19/2016 15:28:04 [name], [name] ([id number]) -------------------------------------------------------------------------------- Lower Extremity Assessment Details Patient Name: [name]. Date of Service: 07/19/2016 2:30 PM Medical Record Number: [id number] Patient Account Number: [id number] Date of Birth/Sex: [birth year] (66 y.o. Female) Treating RN: Ashok Cordia, [name] Primary Care Rayetta Veith: Tonia Ghent Other Clinician: Referring Natisha Trzcinski: Tonia Ghent Treating Geniva Lohnes/Extender: STONE III, HOYT Weeks in Treatment: 1 Vascular Assessment Pulses: Dorsalis Pedis Palpable: [Left:Yes] Posterior Tibial Extremity colors, hair growth, and conditions: Extremity Color: [Left:Normal] Temperature of Extremity: [Left:Warm] Capillary Refill: [Left:< 3 seconds] Toe Nail Assessment Left: Right: Thick: Yes Discolored: No Deformed: No Improper Length and Hygiene: Yes Electronic Signature(s) Signed: 07/19/2016 4:51:20 PM By: Alejandro Mulling Entered By: Alejandro Mulling on 07/19/2016 14:44:07 [name], [name] ([id number]) -------------------------------------------------------------------------------- Multi Wound Chart Details Patient Name: [name]. Date of Service: 07/19/2016 2:30 PM Medical Record Number: [id number] Patient Account Number: [id number] Date of Birth/Sex: [birth year] (66 y.o. 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] [  3:the Lower Extremity] [N/A:N/A] Secondary Etiology: [2:Trauma, Other] [3:Infection - not elsewhere classified] [N/A:N/A] Comorbid History: [2:Asthma, Chronic Obstructive Pulmonary Disease (COPD), Congestive Heart Failure, Congestive Heart Failure, Hypertension, Type II Diabetes, Osteoarthritis, Diabetes, Osteoarthritis, Neuropathy] [3:Asthma, Chronic Obstructive Pulmonary  Disease (COPD), Hypertension, Type II Neuropathy] [N/A:N/A] Date Acquired: [2:06/19/2016] [3:06/14/2016] [N/A:N/A] Weeks of Treatment: [2:1] [3:1] [N/A:N/A] Wound Status: [2:Open] [3:Open] [N/A:N/A] Clustered Wound: [2:Yes] [3:No] [N/A:N/A] Clustered Quantity: [2:2] [3:N/A] [N/A:N/A] Pending Amputation on No [3:Yes] [N/A:N/A] Presentation: Measurements L x W x D 0.6x1.6x0.1 [3:2x0.9x0.1] [N/A:N/A] (cm) Area (cm) : [2:0.754] [3:1.414] [N/A:N/A] Volume (cm) : [2:0.075] [3:0.141] [N/A:N/A] % Reduction in Area: [2:-6.60%] [3:76.00%] [N/A:N/A] % Reduction in Volume: -5.60% [3:76.10%] [N/A:N/A] Classification: [2:Grade 1] [3:Grade 1] [N/A:N/A] Exudate Amount: [2:Medium] [3:Medium] [N/A:N/A] Exudate Type: [2:Serosanguineous] [3:Purulent] [N/A:N/A] Exudate Color: red, brown yellow, brown, green N/A Wound Margin: Distinct, outline attached Distinct, outline attached N/A Granulation Amount: None Present (0%)  Small (1-33%) N/A Granulation Quality: N/A Red N/A Necrotic Amount: Large (67-100%) Large (67-100%) N/A Necrotic Tissue: Eschar, Adherent Slough Eschar, Adherent Slough N/A Exposed Structures: Fascia: No Fascia: No N/A Fat Layer (Subcutaneous Fat Layer (Subcutaneous Tissue) Exposed: No Tissue) Exposed: No Tendon: No Tendon: No Muscle: No Muscle: No Joint: No Joint: No Bone: No Bone: No Limited to Skin Limited to Skin Breakdown Breakdown Epithelialization: None None N/A Periwound Skin Texture: No Abnormalities Noted No Abnormalities Noted N/A Periwound Skin Dry/Scaly: Yes Dry/Scaly: Yes N/A Moisture: Periwound Skin Color: No Abnormalities Noted No Abnormalities Noted N/A Temperature: No Abnormality No Abnormality N/A Tenderness on Yes Yes N/A Palpation: Wound Preparation: Ulcer Cleansing: Ulcer Cleansing: N/A Rinsed/Irrigated with Rinsed/Irrigated with Saline Saline Topical Anesthetic Topical Anesthetic Applied: Other: lidocaine Applied: Other: lidocaine 4% 4% Treatment Notes Electronic Signature(s) Signed: 07/19/2016 4:51:20 PM By: Alejandro Mulling Entered By: Alejandro Mulling on 07/19/2016 14:44:24 [name], [name] -------------------------------------------------------------------------------- Multi-Disciplinary Care Plan Details Patient Name: KARLEA, MCKIBBIN. Date of Service: 07/19/2016 2:30 PM Medical Record Number: [id number] Patient Account Number: [id number] Date of Birth/Sex: [birth year] (66 y.o. Female) Treating RN: Ashok Cordia, [name] Primary Care Valdemar Mcclenahan: Tonia Ghent Other Clinician: Referring Raven Harmes: Tonia Ghent Treating Lambros Cerro/Extender: STONE III, HOYT Weeks in Treatment: 1 Active Inactive ` Abuse / Safety / Falls / Self Care Management Nursing Diagnoses: Potential for falls Goals: Patient will remain injury free Date Initiated: 07/12/2016 Target Resolution Date: 10/26/2016 Goal Status: Active Interventions: Assess fall risk on  admission and as needed Assess impairment of mobility on admission and as needed per policy Notes: ` Nutrition Nursing Diagnoses: Imbalanced nutrition Impaired glucose control: actual or potential Potential for alteratiion in Nutrition/Potential for imbalanced nutrition Goals: Patient/caregiver agrees to and verbalizes understanding of need to use nutritional supplements and/or vitamins as prescribed Date Initiated: 07/12/2016 Target Resolution Date: 09/28/2016 Goal Status: Active Interventions: Assess patient nutrition upon admission and as needed per policy Provide education on elevated blood sugars and impact on wound healing Notes: CHARLYN, VIALPANDO ([id number]) Orientation to the Wound Care Program Nursing Diagnoses: Knowledge deficit related to the wound healing center program Goals: Patient/caregiver will verbalize understanding of the Wound Healing Center Program Date Initiated: 07/12/2016 Target Resolution Date: 07/27/2016 Goal Status: Active Interventions: Provide education on orientation to the wound center Notes: ` Pain, Acute or Chronic Nursing Diagnoses: Pain, acute or chronic: actual or potential Potential alteration in comfort, pain Goals: Patient/caregiver will verbalize adequate pain control between visits Date Initiated: 07/12/2016 Target Resolution Date: 10/26/2016 Goal Status: Active Interventions: Assess comfort goal upon admission Complete pain assessment as per visit requirements Notes: `  Soft Tissue Infection Nursing Diagnoses: Impaired tissue integrity Knowledge deficit related to disease process and management Knowledge deficit related to home infection control: handwashing, handling of soiled dressings, supply storage Goals: Patient/caregiver will verbalize understanding of or measures to prevent infection and contamination in the home setting Date Initiated: 07/12/2016 Target Resolution Date: 09/28/2016 Goal Status: Active RHIANN, BOUCHER  ([id number]) Interventions: Assess signs and symptoms of infection every visit Notes: ` Wound/Skin Impairment Nursing Diagnoses: Impaired tissue integrity Knowledge deficit related to smoking impact on wound healing Knowledge deficit related to ulceration/compromised skin integrity Goals: Ulcer/skin breakdown will have a volume reduction of 80% by week 12 Date Initiated: 07/12/2016 Target Resolution Date: 10/26/2016 Goal Status: Active Interventions: Assess patient/caregiver ability to perform ulcer/skin care regimen upon admission and as needed Assess ulceration(s) every visit Provide education on smoking Notes: Electronic Signature(s) Signed: 07/19/2016 4:51:20 PM By: Alejandro Mulling Entered By: Alejandro Mulling on 07/19/2016 14:44:16 [name], [name] ([id number]) -------------------------------------------------------------------------------- Pain Assessment Details Patient Name: [name]. Date of Service: 07/19/2016 2:30 PM Medical Record Number: [id number] Patient Account Number: [id number] Date of Birth/Sex: 04-Jun-1950 (66 y.o. Female) Treating RN: Ashok Cordia, [name] Primary Care Rhyker Silversmith: Tonia Ghent Other Clinician: Referring Tinzley Dalia: Tonia Ghent Treating Akeia Perot/Extender: STONE III, HOYT Weeks in Treatment: 1 Active Problems Location of Pain Severity and Description of Pain Patient Has Paino Yes Site Locations Pain Location: Pain in Ulcers With Dressing Change: Yes Duration of the Pain. Constant / Intermittento Constant Rate the pain. Current Pain Level: 8 Worst Pain Level: 10 Least Pain Level: 8 Character of Pain Describe the Pain: Tender, Throbbing Pain Management and Medication Current Pain Management: Electronic Signature(s) Signed: 07/19/2016 4:51:20 PM By: Alejandro Mulling Entered By: Alejandro Mulling on 07/19/2016 14:33:57 [name], [name]  ([id number]) -------------------------------------------------------------------------------- Patient/Caregiver Education Details Patient Name: [name]. Date of Service: 07/19/2016 2:30 PM Medical Record Number: [id number] Patient Account Number: [id number] Date of Birth/Gender: Aug 21, 1950 (66 y.o. Female) Treating RN: Ashok Cordia, [name] Primary Care Physician: Tonia Ghent Other Clinician: Referring Physician: Tonia Ghent Treating Physician/Extender: Skeet Simmer in Treatment: 1 Education Assessment Education Provided To: Patient Education Topics Provided Wound/Skin Impairment: Handouts: Other: change dressing as ordered Methods: Demonstration, Explain/Verbal Responses: State content correctly Electronic Signature(s) Signed: 07/19/2016 4:51:20 PM By: Alejandro Mulling Entered By: Alejandro Mulling on 07/19/2016 14:44:52 [name], [name] ([id number]) -------------------------------------------------------------------------------- Wound Assessment Details Patient Name: [name]. Date of Service: 07/19/2016 2:30 PM Medical Record Number: [id number] Patient Account Number: [id number] Date of Birth/Sex: Dec 01, 1950 (66 y.o. Female) Treating RN: Ashok Cordia, [name] Primary Care Makira Holleman: Tonia Ghent Other Clinician: Referring Jaziah Goeller: Tonia Ghent Treating Kela Baccari/Extender: STONE III, HOYT Weeks in Treatment: 1 Wound Status Wound Number: 2 Primary Diabetic Wound/Ulcer of the Lower Etiology: Extremity Wound Location: Left Lower Leg - Lateral Secondary Trauma, Other Wounding Event: Trauma Etiology: Date Acquired: 06/19/2016 Wound Open Weeks Of Treatment: 1 Status: Clustered Wound: Yes Comorbid Asthma, Chronic Obstructive History: Pulmonary Disease (COPD), Congestive Heart Failure, Hypertension, Type II Diabetes, Osteoarthritis, Neuropathy Photos Photo Uploaded By: Alejandro Mulling on 07/19/2016 16:18:07 Wound Measurements Length: (cm) 0.6 % Reduction i Width:  (cm) 1.6 % Reduction i Depth: (cm) 0.1 Epithelializa Clustered Quantity: 2 Tunneling: Area: (cm) 0.754 Undermining: Volume: (cm) 0.075 n Area: -6.6% n Volume: -5.6% tion: None No No Wound Description Classification: Grade 1 Wound Margin: Distinct, outline attached Exudate Amount: Medium Exudate Type: Serosanguineous Exudate Color: red, brown Congrove, Dominique J. ([id number]) Foul Odor After Cleansing: No Slough/Fibrino Yes Wound Bed Granulation Amount: None Present (0%) Exposed Structure Necrotic Amount:  Large (67-100%) Fascia Exposed: No Necrotic Quality: Eschar, Adherent Slough Fat Layer (Subcutaneous Tissue) Exposed: No Tendon Exposed: No Muscle Exposed: No Joint Exposed: No Bone Exposed: No Limited to Skin Breakdown Periwound Skin Texture Texture Color No Abnormalities Noted: No No Abnormalities Noted: No Moisture Temperature / Pain No Abnormalities Noted: No Temperature: No Abnormality Dry / Scaly: Yes Tenderness on Palpation: Yes Wound Preparation Ulcer Cleansing: Rinsed/Irrigated with Saline Topical Anesthetic Applied: Other: lidocaine 4%, Treatment Notes Wound #2 (Left, Lateral Lower Leg) 1. Cleansed with: Clean wound with Normal Saline 2. Anesthetic Topical Lidocaine 4% cream to wound bed prior to debridement 3. Peri-wound Care: Skin Prep 4. Dressing Applied: Santyl Ointment 5. Secondary Dressing Applied Bordered Foam Dressing Non-Adherent pad Electronic Signature(s) Signed: 07/19/2016 4:51:20 PM By: Alejandro Mulling Entered By: Alejandro Mulling on 07/19/2016 14:39:42 [name], [name] ([id number]) -------------------------------------------------------------------------------- Wound Assessment Details Patient Name: [name], MASTEL. Date of Service: 07/19/2016 2:30 PM Medical Record Number: [id number] Patient Account Number: [id number] Date of Birth/Sex: 11-Jan-1951 (66 y.o. Female) Treating RN: Ashok Cordia, [name] Primary Care Deanglo Hissong: Tonia Ghent Other Clinician: Referring Terrion Gencarelli: Tonia Ghent Treating Chellsea Beckers/Extender: STONE III, HOYT Weeks in Treatment: 1 Wound Status Wound Number: 3 Primary Diabetic Wound/Ulcer of the Lower Etiology: Extremity Wound Location: Left Toe Great - Medial Secondary Infection - not elsewhere classified Wounding Event: Gradually Appeared Etiology: Date Acquired: 06/14/2016 Wound Open Weeks Of Treatment: 1 Status: Clustered Wound: No Comorbid Asthma, Chronic Obstructive Pending Amputation On Presentation History: Pulmonary Disease (COPD), Congestive Heart Failure, Hypertension, Type II Diabetes, Osteoarthritis, Neuropathy Photos Photo Uploaded By: Alejandro Mulling on 07/19/2016 16:18:24 Wound Measurements Length: (cm) 2 Width: (cm) 0.9 Depth: (cm) 0.1 Area: (cm) 1.414 Volume: (cm) 0.141 % Reduction in Area: 76% % Reduction in Volume: 76.1% Epithelialization: None Tunneling: No Undermining: No Wound Description Classification: Grade 1 Wound Margin: Distinct, outline attached Exudate Amount: Medium Exudate Type: Purulent Exudate Color: yellow, brown, green Foul Odor After Cleansing: No Slough/Fibrino Yes Wound Bed Morace, Emi J. ([id number]) Granulation Amount: Small (1-33%) Exposed Structure Granulation Quality: Red Fascia Exposed: No Necrotic Amount: Large (67-100%) Fat Layer (Subcutaneous Tissue) Exposed: No Necrotic Quality: Eschar, Adherent Slough Tendon Exposed: No Muscle Exposed: No Joint Exposed: No Bone Exposed: No Limited to Skin Breakdown Periwound Skin Texture Texture Color No Abnormalities Noted: No No Abnormalities Noted: No Moisture Temperature / Pain No Abnormalities Noted: No Temperature: No Abnormality Dry / Scaly: Yes Tenderness on Palpation: Yes Wound Preparation Ulcer Cleansing: Rinsed/Irrigated with Saline Topical Anesthetic Applied: Other: lidocaine 4%, Treatment Notes Wound #3 (Left, Medial Toe Great) 1. Cleansed with: Clean  wound with Normal Saline 2. Anesthetic Topical Lidocaine 4% cream to wound bed prior to debridement 4. Dressing Applied: Santyl Ointment 5. Secondary Dressing Applied Kerlix/Conform Non-Adherent pad 7. 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 ([id number]) -------------------------------------------------------------------------------- Vitals Details Patient Name: [name]. Date of Service: 07/19/2016 2:30 PM Medical Record Number: [id number] Patient Account Number: [id number] Date of Birth/Sex: [birth year] (66 y.o. Female) Treating RN: Ashok Cordia, [name] Primary Care Viney Acocella: Tonia Ghent Other Clinician: Referring Shrinika Blatz: Tonia Ghent Treating Katelinn Justice/Extender: STONE III, HOYT Weeks in Treatment: 1 Vital Signs Time Taken: 14:34 Temperature (F): 98.3 Height (in): 63 Pulse (bpm): 85 Weight (lbs): 224 Respiratory Rate (breaths/min): 20 Body Mass Index (BMI): 39.7 Blood Pressure (mmHg): 149/72 Reference Range: 80 - 120 mg / dl Electronic Signature(s) Signed: 07/19/2016 4:51:20 PM By: Alejandro Mulling Entered By: Alejandro Mulling  on 07/19/2016 14:36:52

## 2016-07-26 ENCOUNTER — Encounter: Payer: Medicare HMO | Attending: Surgery | Admitting: Surgery

## 2016-07-26 DIAGNOSIS — F17218 Nicotine dependence, cigarettes, with other nicotine-induced disorders: Secondary | ICD-10-CM | POA: Insufficient documentation

## 2016-07-26 DIAGNOSIS — F329 Major depressive disorder, single episode, unspecified: Secondary | ICD-10-CM | POA: Diagnosis not present

## 2016-07-26 DIAGNOSIS — Z8673 Personal history of transient ischemic attack (TIA), and cerebral infarction without residual deficits: Secondary | ICD-10-CM | POA: Insufficient documentation

## 2016-07-26 DIAGNOSIS — E11622 Type 2 diabetes mellitus with other skin ulcer: Secondary | ICD-10-CM | POA: Diagnosis not present

## 2016-07-26 DIAGNOSIS — J449 Chronic obstructive pulmonary disease, unspecified: Secondary | ICD-10-CM | POA: Diagnosis not present

## 2016-07-26 DIAGNOSIS — Z880 Allergy status to penicillin: Secondary | ICD-10-CM | POA: Diagnosis not present

## 2016-07-26 DIAGNOSIS — L97511 Non-pressure chronic ulcer of other part of right foot limited to breakdown of skin: Secondary | ICD-10-CM | POA: Insufficient documentation

## 2016-07-26 DIAGNOSIS — M1990 Unspecified osteoarthritis, unspecified site: Secondary | ICD-10-CM | POA: Diagnosis not present

## 2016-07-26 DIAGNOSIS — E11621 Type 2 diabetes mellitus with foot ulcer: Secondary | ICD-10-CM | POA: Diagnosis not present

## 2016-07-26 DIAGNOSIS — L97212 Non-pressure chronic ulcer of right calf with fat layer exposed: Secondary | ICD-10-CM | POA: Insufficient documentation

## 2016-07-26 DIAGNOSIS — I110 Hypertensive heart disease with heart failure: Secondary | ICD-10-CM | POA: Insufficient documentation

## 2016-07-26 DIAGNOSIS — Z885 Allergy status to narcotic agent status: Secondary | ICD-10-CM | POA: Insufficient documentation

## 2016-07-26 DIAGNOSIS — I509 Heart failure, unspecified: Secondary | ICD-10-CM | POA: Diagnosis not present

## 2016-07-26 DIAGNOSIS — Z794 Long term (current) use of insulin: Secondary | ICD-10-CM | POA: Insufficient documentation

## 2016-07-28 NOTE — Progress Notes (Signed)
HALAINA, VANDUZER ([id number]) Visit Report for 07/26/2016 Arrival Information Details Patient Name: [name], [name]. Date of Service: 07/26/2016 12:30 PM Medical Record Number: [id number] Patient Account Number: [id number] Date of Birth/Sex: 1950-10-12 (66 y.o. Female) Treating RN: Ashok Cordia, [name] Primary Care Jacksyn Beeks: Tonia Ghent Other Clinician: Referring Colbi Staubs: Tonia Ghent Treating Jenevieve Kirschbaum/Extender: Rudene Re in Treatment: 2 Visit Information History Since Last Visit All ordered tests and consults were completed: No Patient Arrived: Gilmer Mor Added or deleted any medications: No Arrival Time: 12:45 Any new allergies or adverse reactions: No Accompanied By: self Had a fall or experienced change in No Transfer Assistance: None activities of daily living that may affect Patient Identification Verified: Yes risk of falls: Secondary Verification Process Completed: Yes Signs or symptoms of abuse/neglect since last No Patient Requires Transmission-Based No visito Precautions: Hospitalized since last visit: No Patient Has Alerts: Yes Has Dressing in Place as Prescribed: Yes Patient Alerts: Dm II Pain Present Now: No Electronic Signature(s) Signed: 07/26/2016 4:31:51 PM By: Alejandro Mulling Entered By: Alejandro Mulling on 07/26/2016 12:46:06 Colonna, Amor J. ([id number]) -------------------------------------------------------------------------------- Encounter Discharge Information Details Patient Name: [name]. Date of Service: 07/26/2016 12:30 PM Medical Record Number: [id number] Patient Account Number: [id number] Date of Birth/Sex: Dec 11, 1950 (66 y.o. Female) Treating RN: Ashok Cordia, [name] Primary Care Kile Kabler: Tonia Ghent Other Clinician: Referring Kathalina Ostermann: Tonia Ghent Treating Basel Defalco/Extender: Rudene Re in Treatment: 2 Encounter Discharge Information Items Discharge Pain Level: 0 Discharge Condition: Stable Ambulatory Status: Cane Discharge  Destination: Home Transportation: Private Auto Accompanied By: self Schedule Follow-up Appointment: Yes Medication Reconciliation completed No and provided to Patient/Care John Williamsen: Provided on Clinical Summary of Care: 07/26/2016 Form Type Recipient Paper Patient EP Electronic Signature(s) Signed: 07/26/2016 4:31:51 PM By: Alejandro Mulling Previous Signature: 07/26/2016 1:22:56 PM Version By: Gwenlyn Perking Entered By: Alejandro Mulling on 07/26/2016 13:42:40 Vigil, Girtie J. ([id number]) -------------------------------------------------------------------------------- Lower Extremity Assessment Details Patient Name: [name]. Date of Service: 07/26/2016 12:30 PM Medical Record Number: [id number] Patient Account Number: [id number] Date of Birth/Sex: 1950-07-01 (66 y.o. Female) Treating RN: Ashok Cordia, [name] Primary Care Tangy Drozdowski: Tonia Ghent Other Clinician: Referring Juancarlos Crescenzo: Tonia Ghent Treating Vanita Cannell/Extender: Rudene Re in Treatment: 2 Vascular Assessment Pulses: Dorsalis Pedis Palpable: [Left:Yes] Posterior Tibial Extremity colors, hair growth, and conditions: Extremity Color: [Left:Normal] Temperature of Extremity: [Left:Warm] Capillary Refill: [Left:< 3 seconds] Electronic Signature(s) Signed: 07/26/2016 4:31:51 PM By: Alejandro Mulling Entered By: Alejandro Mulling on 07/26/2016 12:59:11 Leppo, Christopher J. ([id number]) -------------------------------------------------------------------------------- Multi Wound Chart Details Patient Name: [name]. Date of Service: 07/26/2016 12:30 PM Medical Record Number: [id number] Patient Account Number: [id number] Date of Birth/Sex: Apr 08, 1950 (66 y.o. 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] [3:the Lower Extremity] [N/A:N/A] Secondary Etiology: [2:Trauma, Other] [3:Infection - not elsewhere classified] [N/A:N/A] Comorbid History: [2:Asthma, Chronic Obstructive Pulmonary Disease (COPD), Congestive Heart Failure, Congestive Heart Failure, Hypertension, Type II Diabetes, Osteoarthritis, Diabetes, Osteoarthritis, Neuropathy] [3:Asthma, Chronic Obstructive Pulmonary  Disease (COPD), Hypertension, Type II Neuropathy] [N/A:N/A] Date Acquired: [2:06/19/2016] [3:06/14/2016] [N/A:N/A] Weeks of Treatment: [2:2] [3:2] [N/A:N/A] Wound Status: [2:Open] [3:Open] [N/A:N/A] Clustered Wound: [2:Yes] [3:No] [N/A:N/A] Clustered  Quantity: [2:2] [3:N/A] [N/A:N/A] Pending Amputation on No [3:Yes] [N/A:N/A] Presentation: Measurements L x W x D 0.3x1.6x0.1 [3:0.9x0.6x0.1] [N/A:N/A] (cm) Area (cm) : [2:0.377] [3:0.424] [N/A:N/A] Volume (cm) : [2:0.038] [3:0.042] [N/A:N/A] % Reduction in Area: [2:46.70%] [3:92.80%] [N/A:N/A] % Reduction in Volume: 46.50% [3:92.90%] [N/A:N/A] Classification: [2:Grade 1] [3:Grade 1] [N/A:N/A] Exudate Amount: [2:Large] [3:Large] [N/A:N/A] Exudate Type: [2:Serous] [3:Serous] [N/A:N/A] Exudate Color: amber amber N/A Wound Margin: Distinct, outline attached Distinct, outline attached N/A Granulation Amount: Large (67-100%) None Present (0%) N/A Granulation Quality: Red N/A N/A Necrotic Amount: Small (1-33%) Large (67-100%) N/A Necrotic Tissue: Adherent Slough Eschar, Adherent Slough N/A Exposed Structures: Fascia: No Fascia: No N/A Fat Layer (Subcutaneous Fat Layer (Subcutaneous Tissue)  Exposed: No Tissue) Exposed: No Tendon: No Tendon: No Muscle: No Muscle: No Joint: No Joint: No Bone: No Bone: No Limited to Skin Limited to Skin Breakdown Breakdown Epithelialization: None None N/A Debridement: N/A Debridement ([id number]/A [id number]) Pre-procedure N/A 13:09 N/A Verification/Time Out Taken: Pain Control: N/A Lidocaine 4% Topical N/A Solution Tissue Debrided: N/A Fibrin/Slough, Exudates, N/A Subcutaneous Level: N/A Skin/Subcutaneous N/A Tissue Debridement Area (sq N/A 0.54 N/A cm): Instrument: N/A Curette N/A Bleeding: N/A Minimum N/A Hemostasis Achieved: N/A Pressure N/A Procedural Pain: N/A 0 N/A Post Procedural Pain: N/A 0 N/A Debridement Treatment N/A Procedure was tolerated N/A Response: well Post Debridement N/A 0.9x0.6x0.1 N/A Measurements L x W x D (cm) Post Debridement N/A 0.042 N/A Volume: (cm) Periwound Skin Texture: No Abnormalities Noted No Abnormalities Noted N/A Periwound Skin Dry/Scaly: Yes Dry/Scaly: Yes N/A Moisture: Periwound Skin Color: No Abnormalities Noted No Abnormalities Noted N/A Temperature: No Abnormality No Abnormality N/A Tenderness on Yes Yes N/A Palpation: Wound Preparation: N/A THIA, OLESEN ([id number]) Ulcer Cleansing: Ulcer Cleansing: Rinsed/Irrigated with Rinsed/Irrigated with Saline Saline Topical Anesthetic Topical Anesthetic Applied: Other: lidocaine Applied: Other: lidocaine 4% 4% Procedures Performed: N/A Debridement N/A Treatment Notes Electronic Signature(s) Signed: 07/26/2016 1:41:47 PM By: Evlyn Kanner MD, FACS Entered By: Evlyn Kanner on 07/26/2016 13:41:47 Leard, [name] ([id number]) -------------------------------------------------------------------------------- Multi-Disciplinary Care Plan Details Patient Name: KATERA, RYBKA. Date of Service: 07/26/2016 12:30 PM Medical Record Number: [id number] Patient Account Number: [id number] Date of Birth/Sex: [birth year] (66 y.o. Female) Treating RN:  Ashok Cordia, [name] Primary Care Peretz Thieme: Tonia Ghent Other Clinician: Referring Mykenzi Vanzile: Tonia Ghent Treating Chandel Zaun/Extender: Rudene Re in Treatment: 2 Active Inactive ` Abuse / Safety / Falls / Self Care Management Nursing Diagnoses: Potential for falls Goals: Patient will remain injury free Date Initiated: 07/12/2016 Target Resolution Date: 10/26/2016 Goal Status: Active Interventions: Assess fall risk on admission and as needed Assess impairment of mobility on admission and as needed per policy Notes: ` Nutrition Nursing Diagnoses: Imbalanced nutrition Impaired glucose control: actual or potential Potential for alteratiion in Nutrition/Potential for imbalanced nutrition Goals: Patient/caregiver agrees to and verbalizes understanding of need to use nutritional supplements and/or vitamins as prescribed Date Initiated: 07/12/2016 Target Resolution Date: 09/28/2016 Goal Status: Active Interventions: Assess patient nutrition upon admission and as needed per policy Provide education on elevated blood sugars and impact on wound healing Notes: JESLYNN, HOLLANDER ([id number]) Orientation to the Wound Care Program Nursing Diagnoses: Knowledge deficit related to the wound healing center program Goals: Patient/caregiver will verbalize understanding of the Wound Healing Center Program Date Initiated: 07/12/2016 Target Resolution Date: 07/27/2016 Goal Status: Active Interventions: Provide education on orientation to the wound center Notes: ` Pain, Acute or Chronic Nursing Diagnoses: Pain, acute or chronic: actual or potential Potential alteration in comfort, pain Goals: Patient/caregiver will verbalize adequate pain  control between visits Date Initiated: 07/12/2016 Target Resolution Date: 10/26/2016 Goal Status: Active Interventions: Assess comfort goal upon admission Complete pain assessment as per visit requirements Notes: ` Soft Tissue Infection Nursing  Diagnoses: Impaired tissue integrity Knowledge deficit related to disease process and management Knowledge deficit related to home infection control: handwashing, handling of soiled dressings, supply storage Goals: Patient/caregiver will verbalize understanding of or measures to prevent infection and contamination in the home setting Date Initiated: 07/12/2016 Target Resolution Date: 09/28/2016 Goal Status: Active TEAL, RABEN ([id number]) Interventions: Assess signs and symptoms of infection every visit Notes: ` Wound/Skin Impairment Nursing Diagnoses: Impaired tissue integrity Knowledge deficit related to smoking impact on wound healing Knowledge deficit related to ulceration/compromised skin integrity Goals: Ulcer/skin breakdown will have a volume reduction of 80% by week 12 Date Initiated: 07/12/2016 Target Resolution Date: 10/26/2016 Goal Status: Active Interventions: Assess patient/caregiver ability to perform ulcer/skin care regimen upon admission and as needed Assess ulceration(s) every visit Provide education on smoking Notes: Electronic Signature(s) Signed: 07/26/2016 4:31:51 PM By: Alejandro Mulling Entered By: Alejandro Mulling on 07/26/2016 12:59:41 Buenger, Saliha J. ([id number]) -------------------------------------------------------------------------------- Pain Assessment Details Patient Name: [name]. Date of Service: 07/26/2016 12:30 PM Medical Record Number: [id number] Patient Account Number: [id number] Date of Birth/Sex: May 17, 1950 (66 y.o. Female) Treating RN: Ashok Cordia, [name] Primary Care Aviance Cooperwood: Tonia Ghent Other Clinician: Referring Iyonna Rish: Tonia Ghent Treating Nicholos Aloisi/Extender: Rudene Re in Treatment: 2 Active Problems Location of Pain Severity and Description of Pain Patient Has Paino No Site Locations With Dressing Change: No Pain Management and Medication Current Pain Management: Electronic Signature(s) Signed: 07/26/2016  4:31:51 PM By: Alejandro Mulling Entered By: Alejandro Mulling on 07/26/2016 12:46:14 Deguia, [name] ([id number]) -------------------------------------------------------------------------------- Patient/Caregiver Education Details Patient Name: [name]. Date of Service: 07/26/2016 12:30 PM Medical Record Number: [id number] Patient Account Number: [id number] Date of Birth/Gender: 19-May-1950 (66 y.o. Female) Treating RN: Ashok Cordia, [name] Primary Care Physician: Tonia Ghent Other Clinician: Referring Physician: Tonia Ghent Treating Physician/Extender: Rudene Re in Treatment: 2 Education Assessment Education Provided To: Patient Education Topics Provided Wound/Skin Impairment: Handouts: Other: change dressing as ordered Methods: Demonstration, Explain/Verbal Responses: State content correctly Electronic Signature(s) Signed: 07/26/2016 4:31:51 PM By: Alejandro Mulling Entered By: Alejandro Mulling on 07/26/2016 13:42:52 Moncivais, Kinzey J. ([id number]) -------------------------------------------------------------------------------- Wound Assessment Details Patient Name: [name]. Date of Service: 07/26/2016 12:30 PM Medical Record Number: [id number] Patient Account Number: [id number] Date of Birth/Sex: July 17, 1950 (66 y.o. Female) Treating RN: Ashok Cordia, [name] Primary Care Allannah Kempen: Tonia Ghent Other Clinician: Referring Wilburta Milbourn: Tonia Ghent Treating Raymonde Hamblin/Extender: Rudene Re in Treatment: 2 Wound Status Wound Number: 2 Primary Diabetic Wound/Ulcer of the Lower Etiology: Extremity Wound Location: Left Lower Leg - Lateral Secondary Trauma, Other Wounding Event: Trauma Etiology: Date Acquired: 06/19/2016 Wound Open Weeks Of Treatment: 2 Status: Clustered Wound: Yes Comorbid Asthma, Chronic Obstructive History: Pulmonary Disease (COPD), Congestive Heart Failure, Hypertension, Type II Diabetes, Osteoarthritis, Neuropathy Photos Photo Uploaded By:  Alejandro Mulling on 07/26/2016 16:23:17 Wound Measurements Length: (cm) 0.3 % Reduction i Width: (cm) 1.6 % Reduction i Depth: (cm) 0.1 Epithelializa Clustered Quantity: 2 Tunneling: Area: (cm) 0.377 Undermining: Volume: (cm) 0.038 n Area: 46.7% n Volume: 46.5% tion: None No No Wound Description Classification: Grade 1 Wound Margin: Distinct, outline attached Exudate Amount: Large Exudate Type: Serous Exudate Color: amber Massi, Halyn J. ([id number]) Foul Odor After Cleansing: No Slough/Fibrino Yes Wound Bed Granulation Amount: Large (67-100%) Exposed Structure Granulation Quality: Red Fascia Exposed: No Necrotic Amount: Small (1-33%) Fat Layer (Subcutaneous Tissue) Exposed:  No Necrotic Quality: Adherent Slough Tendon Exposed: No Muscle Exposed: No Joint Exposed: No Bone Exposed: No Limited to Skin Breakdown Periwound Skin Texture Texture Color No Abnormalities Noted: No No Abnormalities Noted: No Moisture Temperature / Pain No Abnormalities Noted: No Temperature: No Abnormality Dry / Scaly: Yes Tenderness on Palpation: Yes Wound Preparation Ulcer Cleansing: Rinsed/Irrigated with Saline Topical Anesthetic Applied: Other: lidocaine 4%, Treatment Notes Wound #2 (Left, Lateral Lower Leg) 1. Cleansed with: Clean wound with Normal Saline 2. Anesthetic Topical Lidocaine 4% cream to wound bed prior to debridement 4. Dressing Applied: Aquacel Ag 5. Secondary Dressing Applied Bordered Foam Dressing Dry Gauze Electronic Signature(s) Signed: 07/26/2016 4:31:51 PM By: Alejandro MullingPinkerton, Debra Entered By: Alejandro MullingPinkerton, Debra on 07/26/2016 12:55:13 Weill, Emileigh J. ([id number]) -------------------------------------------------------------------------------- Wound Assessment Details Patient Name: Kathleen PiggRRIS, Chevette J. Date of Service: 07/26/2016 12:30 PM Medical Record Number: [id number] Patient Account Number: [id number] Date of Birth/Sex: 1950-05-16 (66 y.o. Female) Treating RN:  Ashok CordiaPinkerton, [name] Primary Care Dorothy Landgrebe: Tonia GhentBENDER, ABBY Other Clinician: Referring Cheston Coury: Tonia GhentBENDER, ABBY Treating Drena Ham/Extender: Rudene ReBritto, Errol Weeks in Treatment: 2 Wound Status Wound Number: 3 Primary Diabetic Wound/Ulcer of the Lower Etiology: Extremity Wound Location: Left Toe Great - Medial Secondary Infection - not elsewhere classified Wounding Event: Gradually Appeared Etiology: Date Acquired: 06/14/2016 Wound Open Weeks Of Treatment: 2 Status: Clustered Wound: No Comorbid Asthma, Chronic Obstructive Pending Amputation On Presentation History: Pulmonary Disease (COPD), Congestive Heart Failure, Hypertension, Type II Diabetes, Osteoarthritis, Neuropathy Photos Photo Uploaded By: Alejandro MullingPinkerton, Debra on 07/26/2016 16:23:37 Wound Measurements Length: (cm) 0.9 Width: (cm) 0.6 Depth: (cm) 0.1 Area: (cm) 0.424 Volume: (cm) 0.042 % Reduction in Area: 92.8% % Reduction in Volume: 92.9% Epithelialization: None Tunneling: No Undermining: No Wound Description Classification: Grade 1 Wound Margin: Distinct, outline attached Exudate Amount: Large Exudate Type: Serous Exudate Color: amber Foul Odor After Cleansing: No Slough/Fibrino Yes Wound Bed Heeg, Cleo J. ([id number]) Granulation Amount: None Present (0%) Exposed Structure Necrotic Amount: Large (67-100%) Fascia Exposed: No Necrotic Quality: Eschar, Adherent Slough Fat Layer (Subcutaneous Tissue) Exposed: No Tendon Exposed: No Muscle Exposed: No Joint Exposed: No Bone Exposed: No Limited to Skin Breakdown Periwound Skin Texture Texture Color No Abnormalities Noted: No No Abnormalities Noted: No Moisture Temperature / Pain No Abnormalities Noted: No Temperature: No Abnormality Dry / Scaly: Yes Tenderness on Palpation: Yes Wound Preparation Ulcer Cleansing: Rinsed/Irrigated with Saline Topical Anesthetic Applied: Other: lidocaine 4%, Treatment Notes Wound #3 (Left, Medial Toe Great) 1. Cleansed  with: Clean wound with Normal Saline 2. Anesthetic Topical Lidocaine 4% cream to wound bed prior to debridement 4. Dressing Applied: Santyl Ointment 5. Secondary Dressing Applied Dry Gauze Kerlix/Conform 7. Secured with Tape Notes [employer] Electronic Signature(s) Signed: 07/26/2016 4:31:51 PM By: Alejandro MullingPinkerton, Debra Entered By: Alejandro MullingPinkerton, Debra on 07/26/2016 12:55:58 Mcfadyen, Winnona J. ([id number]) -------------------------------------------------------------------------------- Vitals Details Patient Name: Kathleen PiggPARRIS, Ellamae J. Date of Service: 07/26/2016 12:30 PM Medical Record Number: [id number] Patient Account Number: [id number] Date of Birth/Sex: 1950-05-16 (66 y.o. Female) Treating RN: Ashok CordiaPinkerton, [name] Primary Care Rome Schlauch: Tonia GhentBENDER, ABBY Other Clinician: Referring Antrell Tipler: Tonia GhentBENDER, ABBY Treating Denisa Enterline/Extender: Rudene ReBritto, Errol Weeks in Treatment: 2 Vital Signs Time Taken: 12:46 Temperature (F): 98.4 Height (in): 63 Pulse (bpm): 90 Weight (lbs): 224 Respiratory Rate (breaths/min): 20 Body Mass Index (BMI): 39.7 Blood Pressure (mmHg): 159/69 Reference Range: 80 - 120 mg / dl Electronic Signature(s) Signed: 07/26/2016 4:31:51 PM By: Alejandro MullingPinkerton, Debra Entered By: Alejandro MullingPinkerton, Debra on 07/26/2016 12:47:51

## 2016-07-28 NOTE — Progress Notes (Signed)
VICTOIRE, DEANS ([id number]) Visit Report for 07/26/2016 Chief Complaint Document Details Patient Name: [name], [name]. Date of Service: 07/26/2016 12:30 PM Medical Record Number: [id number] Patient Account Number: [id number] Date of Birth/Sex: 04/01/1950 (66 y.o. Female) Treating RN: Ashok Cordia, [name] Primary Care Provider: Tonia Ghent Other Clinician: Referring Provider: Tonia Ghent Treating Provider/Extender: Rudene Re in Treatment: 2 Information Obtained from: Patient Chief Complaint [name] presents today for evaluation of her left medial hallux and left lateral lower extremity ulcers Electronic Signature(s) Signed: 07/26/2016 1:43:29 PM By: Evlyn Kanner MD, FACS Entered By: Evlyn Kanner on 07/26/2016 13:43:29 [name], [name] ([id number]) -------------------------------------------------------------------------------- Debridement Details Patient Name: [name]. Date of Service: 07/26/2016 12:30 PM Medical Record Number: [id number] Patient Account Number: [id number] Date of Birth/Sex: 1950-05-26 (66 y.o. Female) Treating RN: Ashok Cordia, [name] Primary Care Provider: Tonia Ghent Other Clinician: Referring Provider: Tonia Ghent Treating Provider/Extender: Rudene Re in Treatment: 2 Debridement Performed for Wound #3 Left,Medial Toe Great Assessment: Performed By: Physician Evlyn Kanner, MD Debridement: Debridement Pre-procedure Yes - 13:09 Verification/Time Out Taken: Start Time: 13:10 Pain Control: Lidocaine 4% Topical Solution Level: Skin/Subcutaneous Tissue Total Area Debrided (L x 0.9 (cm) x 0.6 (cm) = 0.54 (cm) W): Tissue and other Viable, Non-Viable, Exudate, Fibrin/Slough, Subcutaneous material debrided: Instrument: Curette Bleeding: Minimum Hemostasis Achieved: Pressure End Time: 13:13 Procedural Pain: 0 Post Procedural Pain: 0 Response to Treatment: Procedure was tolerated well Post Debridement Measurements of Total Wound Length:  (cm) 0.9 Width: (cm) 0.6 Depth: (cm) 0.1 Volume: (cm) 0.042 Character of Wound/Ulcer Post Requires Further Debridement Debridement: Severity of Tissue Post Debridement: Fat layer exposed Post Procedure Diagnosis Same as Pre-procedure Electronic Signature(s) Signed: 07/26/2016 1:43:21 PM By: Evlyn Kanner MD, FACS Signed: 07/26/2016 4:31:51 PM By: Alejandro Mulling Entered By: Evlyn Kanner on 07/26/2016 13:43:21 Olmsted, Nikitta J. ([id number]) [name], [name] ([id number]) -------------------------------------------------------------------------------- HPI Details Patient Name: [name], [name]. Date of Service: 07/26/2016 12:30 PM Medical Record Number: [id number] Patient Account Number: [id number] Date of Birth/Sex: Nov 14, 1950 (66 y.o. Female) Treating RN: Ashok Cordia, [name] Primary Care Provider: Tonia Ghent Other Clinician: Referring Provider: Tonia Ghent Treating Provider/Extender: Rudene Re in Treatment: 2 History of Present Illness Location: left medial hallux; LLE lateral Duration: left medial hallux-4 weeks; LLE lateral-2-1/2 weeks Timing: left medial hallux-pain with pressure/standing, ambulating, manipulation; LLE lateral-pain with manipulation Context: left medial hallux-originally was a crack in the skin; LLE lateral-traumatic injury with car door Modifying Factors: uncontrolled diabetes and smoking are exacerbating factors to wound healing HPI Description: 07/12/16- the patient arrives for initial evaluation of a left hallux and left lower extremity lateral ulcer. She states that the left hallux originally had a crack in the skin which she began to apply AandD ointment. She went to her PCP at [facility] on 4/4, while going into her appointment she struck her left lateral leg on the car door. At that appointment she was given a prescription for Bactrim and completed that on 4/14 with no adverse reaction. She states that it made little to no improvement  in her toe wound. She went back for a follow-up visit on 4/18 and was prescribed doxycycline for 10 days. She continues to apply AandD topical ointment along with soaking her foot in warm water. She is a diabetic, most recent A1c of 9% (I do not have records of this, this is per patient report). She is both on oral agents and insulin. She is a current smoker, approximately 0.5 ppd. She denies any remote or recent evaluation  regarding arterial or venous disease. 07/19/16 Patient presents today for follow-up evaluation concerning her left great toe wound immediately and left lateral lower extremity wound. Unfortunately though both wounds appear better visually she is having a lot of discomfort. I'm unsure if this is a preformed drop of the issue or potentially the ideas or appointment is causing irritation which has caused her discomfort and pain. The good news is her wound culture came back negative and showed only normal skin flora and her x-ray of the left foot was also negative for any osseous change or osteomyelitis. Unfortunately she continues to have a lot of discomfort she would rate it a seven out of 10 right now. 07/26/2016 -- she is doing much better since the Iodoflex was stopped and she is on Santyl. Her culture report was negative and only had normal skin flora. She continues to smoke about a half pack of cigarettes a day. Electronic Signature(s) Signed: 07/26/2016 1:44:51 PM By: Evlyn Kanner MD, FACS Entered By: Evlyn Kanner on 07/26/2016 13:44:51 [name], [name] -------------------------------------------------------------------------------- Physical Exam Details Patient Name: [name], [name]. Date of Service: 07/26/2016 12:30 PM Medical Record Number: [id number] Patient Account Number: [id number] Date of Birth/Sex: [birth year] (66 y.o. Female) Treating RN: Ashok Cordia, [name] Primary Care Provider: Tonia Ghent Other Clinician: Referring Provider: Tonia Ghent Treating  Provider/Extender: Rudene Re in Treatment: 2 Constitutional . Pulse regular. Respirations normal and unlabored. Afebrile. . Eyes Nonicteric. Reactive to light. Ears, Nose, Mouth, and Throat Lips, teeth, and gums WNL.Marland Kitchen Moist mucosa without lesions. Neck supple and nontender. No palpable supraclavicular or cervical adenopathy. Normal sized without goiter. Respiratory WNL. No retractions.. Breath sounds WNL, No rubs, rales, rhonchi, or wheeze.. Cardiovascular Heart rhythm and rate regular, no murmur or gallop.. Pedal Pulses WNL. No clubbing, cyanosis or edema. Chest Breasts symmetical and no nipple discharge.. Breast tissue WNL, no masses, lumps, or tenderness.. Lymphatic No adneopathy. No adenopathy. No adenopathy. Musculoskeletal Adexa without tenderness or enlargement.. Digits and nails w/o clubbing, cyanosis, infection, petechiae, ischemia, or inflammatory conditions.. Integumentary (Hair, Skin) No suspicious lesions. No crepitus or fluctuance. No peri-wound warmth or erythema. No masses.Marland Kitchen Psychiatric Judgement and insight Intact.. No evidence of depression, anxiety, or agitation.. Notes the left lateral calf looks good and does not have any debris and no sharp debridement was required. The left medial big toe has significant amount of loose eschar and subcutaneous debris and this was both sharply removed with a #3 curet and minimal bleeding controlled with pressure. She will continue to use Santyl ointment on this. Electronic Signature(s) Signed: 07/26/2016 1:45:32 PM By: Evlyn Kanner MD, FACS Entered By: Evlyn Kanner on 07/26/2016 13:45:31 Takayama, [name] ([id number]) -------------------------------------------------------------------------------- Physician Orders Details Patient Name: [name], [name]. Date of Service: 07/26/2016 12:30 PM Medical Record Number: [id number] Patient Account Number: [id number] Date of Birth/Sex: 1950-12-27 (66 y.o. Female) Treating RN:  Ashok Cordia, [name] Primary Care Provider: Tonia Ghent Other Clinician: Referring Provider: Tonia Ghent Treating Provider/Extender: Rudene Re in Treatment: 2 Verbal / Phone Orders: Yes Clinician: [name], [name] Read Back and Verified: Yes Diagnosis Coding Wound Cleansing Wound #2 Left,Lateral Lower Leg o Clean wound with Normal Saline. o Cleanse wound with mild soap and water Wound #3 Left,Medial Toe Great o Clean wound with Normal Saline. o Cleanse wound with mild soap and water Anesthetic Wound #2 Left,Lateral Lower Leg o Topical Lidocaine 4% cream applied to wound bed prior to debridement Wound #3 Left,Medial Toe Great o Topical Lidocaine 4% cream applied to wound bed prior to  debridement Skin Barriers/Peri-Wound Care Wound #2 Left,Lateral Lower Leg o Skin Prep Wound #3 Left,Medial Toe Great o Skin Prep Primary Wound Dressing Wound #2 Left,Lateral Lower Leg o Aquacel Ag Wound #3 Left,Medial Toe Great o Santyl Ointment Secondary Dressing Wound #2 Left,Lateral Lower Leg o Dry Gauze o Boardered Foam Dressing Wound #3 Left,Medial Toe Great Ahner, Shaneika J. ([id number]) o Dry Gauze o Gauze and Kerlix/Conform o Other - tape Dressing Change Frequency Wound #2 Left,Lateral Lower Leg o Change Dressing Monday, Wednesday, Friday o Other: - PRN Wound #3 Left,Medial Toe Great o Change dressing every day. o Other: - PRN Follow-up Appointments Wound #2 Left,Lateral Lower Leg o Return Appointment in 1 week. Wound #3 Left,Medial Toe Great o Return Appointment in 1 week. Edema Control Wound #2 Left,Lateral Lower Leg o Elevate legs to the level of the heart and pump ankles as often as possible Wound #3 Left,Medial Toe Great o Elevate legs to the level of the heart and pump ankles as often as possible Additional Orders / Instructions Wound #2 Left,Lateral Lower Leg o Stop Smoking o Increase protein intake. Wound #3  Left,Medial Toe Great o Stop Smoking o Increase protein intake. Medications-please add to medication list. Wound #2 Left,Lateral Lower Leg o Santyl Enzymatic Ointment Wound #3 Left,Medial Toe Great o Santyl Enzymatic Ointment Electronic Signature(s) Signed: 07/26/2016 4:27:48 PM By: Evlyn Kanner MD, FACS [name], [name] Kitchen ([id number]) Signed: 07/26/2016 4:31:51 PM By: Alejandro Mulling Entered By: Alejandro Mulling on 07/26/2016 13:15:44 Humphreys, Jerie J. ([id number]) -------------------------------------------------------------------------------- Problem List Details Patient Name: [name], [name]. Date of Service: 07/26/2016 12:30 PM Medical Record Number: [id number] Patient Account Number: [id number] Date of Birth/Sex: [birth year] (66 y.o. 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 Date of Service: 07/26/2016 12:30 PM Medical Record Number: [id number] Patient Account Number: [id number] Date of Birth/Sex: 01/25/1951 (66 y.o.  Female) Treating RN: Ashok Cordia, [name] Primary Care Provider: Tonia Ghent Other Clinician: Referring Provider: Tonia Ghent Treating Provider/Extender: Rudene Re in Treatment: 2 Subjective Chief Complaint Information obtained from Patient Mrs. Fleury presents today for evaluation of her left medial hallux and left lateral lower extremity ulcers History of Present Illness (HPI) The following HPI elements were documented for the patient's wound: Location: left medial hallux; LLE lateral Duration: left medial hallux-4 weeks; LLE lateral-2-1/2 weeks Timing: left medial hallux-pain with pressure/standing, ambulating, manipulation; LLE lateral-pain with manipulation Context: left medial hallux-originally was a crack in the skin; LLE lateral-traumatic injury with car door Modifying Factors: uncontrolled diabetes and smoking are exacerbating factors to wound healing 07/12/16- the patient arrives for initial evaluation of a left hallux and left lower extremity lateral ulcer. She states that the left hallux originally had a crack in the skin which she began to apply AandD ointment. She went to her PCP at [facility] on 4/4, while going into her appointment she struck her left lateral leg on the car door. At that appointment she was given a prescription for Bactrim and completed that on 4/14 with no adverse reaction. She states that it made little to no improvement in her toe wound. She went back for a follow-up visit on 4/18 and was prescribed doxycycline for 10 days. She continues to apply AandD topical ointment along with soaking her foot in warm water. She is a diabetic, most recent A1c of 9% (I do not have records of this, this is per patient report). She is both on oral agents and insulin. She is a current smoker, approximately 0.5 ppd. She denies any remote or recent evaluation regarding arterial or venous disease. 07/19/16 Patient presents today for follow-up  evaluation concerning her left great toe wound immediately and left lateral lower extremity wound. Unfortunately though both wounds appear better visually she is having a lot of discomfort. I'm unsure if this is a preformed drop of the issue or potentially the ideas or appointment is causing irritation which has caused her discomfort and pain. The good news is her wound culture came back negative and showed only normal skin flora and her x-ray of the left foot was also negative for any osseous change or osteomyelitis. Unfortunately she continues to have a lot of discomfort she would rate it a seven out of 10 right now. 07/26/2016 -- she is doing much better since the Iodoflex was stopped and she is on Santyl. Her culture report was negative and only had normal skin flora. She continues to smoke about a half pack of cigarettes a day. [name], [name] ([id number]) Objective Constitutional Pulse regular. Respirations normal and unlabored. Afebrile. Vitals Time Taken: 12:46 PM, Height: 63 in, Weight: 224 lbs, BMI: 39.7, Temperature: 98.4 F, Pulse: 90 bpm, Respiratory Rate: 20 breaths/min, Blood Pressure: 159/69 mmHg. Eyes Nonicteric. Reactive to light. Ears, Nose, Mouth, and Throat Lips, teeth, and gums WNL.Marland Kitchen. Moist mucosa without lesions. Neck supple and nontender. No palpable supraclavicular or cervical adenopathy. Normal sized without goiter. Respiratory WNL. No retractions.. Breath sounds WNL, No rubs, rales, rhonchi, or wheeze.. Cardiovascular Heart rhythm and rate regular, no murmur or gallop.. Pedal Pulses WNL. No clubbing, cyanosis or edema. Chest Breasts symmetical and no nipple discharge.. Breast tissue WNL, no masses, lumps, or tenderness.. Lymphatic No adneopathy. No adenopathy. No adenopathy. Musculoskeletal Adexa without tenderness or enlargement.. Digits and nails w/o clubbing, cyanosis, infection, petechiae, ischemia, or inflammatory conditions.Marland Kitchen. Psychiatric Judgement and  insight Intact.. No evidence of depression, anxiety, or agitation.. General Notes: the left lateral calf looks good and does not have any debris and no sharp debridement was required. The left medial big toe has significant amount of loose eschar and subcutaneous debris and this was both sharply removed with a #3 curet and minimal bleeding controlled with pressure. She will continue to use Santyl ointment on this. Integumentary (Hair, Skin) No suspicious lesions. No crepitus or fluctuance. No peri-wound warmth or erythema. No masses.. Wound #2 status is Open. Original cause of wound was Trauma. The wound is located on the Left,Lateral Liston, Iasha J. ([id number]) Lower Leg. The wound measures 0.3cm length x 1.6cm width x 0.1cm depth; 0.377cm^2 area and 0.038cm^3 volume. The wound is limited to skin breakdown. There is no tunneling or undermining noted. There is a large amount of serous drainage noted. The wound margin is distinct with the outline attached to the wound base. There is large (67-100%) red granulation  within the wound bed. There is a small (1-33%) amount of necrotic tissue within the wound bed including Adherent Slough. The periwound skin appearance exhibited: Dry/Scaly. Periwound temperature was noted as No Abnormality. The periwound has tenderness on palpation. Wound #3 status is Open. Original cause of wound was Gradually Appeared. The wound is located on the [employer]. The wound measures 0.9cm length x 0.6cm width x 0.1cm depth; 0.424cm^2 area and 0.042cm^3 volume. The wound is limited to skin breakdown. There is no tunneling or undermining noted. There is a large amount of serous drainage noted. The wound margin is distinct with the outline attached to the wound base. There is no granulation within the wound bed. There is a large (67-100%) amount of necrotic tissue within the wound bed including Eschar and Adherent Slough. The periwound skin appearance exhibited:  Dry/Scaly. Periwound temperature was noted as No Abnormality. The periwound has tenderness on palpation. Assessment Active Problems ICD-10 E11.621 - Type 2 diabetes mellitus with foot ulcer E11.622 - Type 2 diabetes mellitus with other skin ulcer L97.511 - Non-pressure chronic ulcer of other part of right foot limited to breakdown of skin L97.212 - Non-pressure chronic ulcer of right calf with fat layer exposed F17.218 - Nicotine dependence, cigarettes, with other nicotine-induced disorders Procedures Wound #3 Wound #3 is a Diabetic Wound/Ulcer of the Lower Extremity located on the Left,Medial Toe Great . There was a Skin/Subcutaneous Tissue Debridement ([id number]) debridement with total area of 0.54 sq cm performed by Evlyn Kanner, MD. with the following instrument(s): Curette to remove Viable and Non-Viable tissue/material including Exudate, Fibrin/Slough, and Subcutaneous after achieving pain control using Lidocaine 4% Topical Solution. A time out was conducted at 13:09, prior to the start of the procedure. A Minimum amount of bleeding was controlled with Pressure. The procedure was tolerated well with a pain level of 0 throughout and a pain level of 0 following the procedure. Post Debridement Measurements: 0.9cm length x 0.6cm width x 0.1cm depth; 0.042cm^3 volume. Character of Wound/Ulcer Post Debridement requires further debridement. Severity of Tissue Post Debridement is: Fat layer exposed. Post procedure Diagnosis Wound #3: Same as Pre-Procedure Leng, Brett J. ([id number]) Plan Wound Cleansing: Wound #2 Left,Lateral Lower Leg: Clean wound with Normal Saline. Cleanse wound with mild soap and water Wound #3 Left,Medial Toe Great: Clean wound with Normal Saline. Cleanse wound with mild soap and water Anesthetic: Wound #2 Left,Lateral Lower Leg: Topical Lidocaine 4% cream applied to wound bed prior to debridement Wound #3 Left,Medial Toe Great: Topical Lidocaine 4% cream  applied to wound bed prior to debridement Skin Barriers/Peri-Wound Care: Wound #2 Left,Lateral Lower Leg: Skin Prep Wound #3 Left,Medial Toe Great: Skin Prep Primary Wound Dressing: Wound #2 Left,Lateral Lower Leg: Aquacel Ag Wound #3 Left,Medial Toe Great: Santyl Ointment Secondary Dressing: Wound #2 Left,Lateral Lower Leg: Dry Gauze Boardered Foam Dressing Wound #3 Left,Medial Toe Great: Dry Gauze Gauze and Kerlix/Conform Other - tape Dressing Change Frequency: Wound #2 Left,Lateral Lower Leg: Change Dressing Monday, Wednesday, Friday Other: - PRN Wound #3 Left,Medial Toe Great: Change dressing every day. Other: - PRN Follow-up Appointments: Wound #2 Left,Lateral Lower Leg: Return Appointment in 1 week. Wound #3 Left,Medial Toe Great: Return Appointment in 1 week. SHANNYN, JANKOWIAK ([id number]) Edema Control: Wound #2 Left,Lateral Lower Leg: Elevate legs to the level of the heart and pump ankles as often as possible Wound #3 Left,Medial Toe Great: Elevate legs to the level of the heart and pump ankles as often as possible Additional Orders / Instructions: Wound #  2 Left,Lateral Lower Leg: Stop Smoking Increase protein intake. Wound #3 Left,Medial Toe Great: Stop Smoking Increase protein intake. Medications-please add to medication list.: Wound #2 Left,Lateral Lower Leg: Santyl Enzymatic Ointment Wound #3 Left,Medial Toe Great: Santyl Enzymatic Ointment after reviewing the patient for the first time today I have spent some time discussing with her the need to completely give up smoking and have reiterated the importance of this. I have also recommended: 1. Santyl ointment to be continued to the left big toe daily. 2. silver alginate with a bordered foam to the left lower extremity 3. Elevation and exercise as she has minimal pedal edema 4. Good control of her diabetes mellitus 5. Adequate protein, vitamin A, vitamin C and zinc 6. regular visits the wound  center Electronic Signature(s) Signed: 07/26/2016 1:47:00 PM By: Evlyn Kanner MD, FACS Entered By: Evlyn Kanner on 07/26/2016 13:46:59 Dome, Umaima J. ([id number]) -------------------------------------------------------------------------------- SuperBill Details Patient Name: [name]. Date of Service: 07/26/2016 Medical Record Number: [id number] Patient Account Number: [id number] Date of Birth/Sex: Mar 23, 1951 (66 y.o. Female) Treating RN: Ashok Cordia, [name] Primary Care Provider: Tonia Ghent Other Clinician: Referring Provider: Tonia Ghent Treating Provider/Extender: Rudene Re in Treatment: 2 Diagnosis Coding ICD-10 Codes Code Description 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 2 diabetes mellitus with foot ulcer E11.622 Type 2 diabetes mellitus with other skin ulcer L97.511 Non-pressure chronic ulcer of other part of right foot limited to breakdown of skin L97.212 Non-pressure chronic ulcer of right calf with fat layer exposed F17.218 Nicotine dependence, cigarettes, with other nicotine-induced disorders Facility Procedures CPT4: Description Modifier Quantity Code [id number] 11042 - DEB SUBQ TISSUE 20 SQ CM/< 1 ICD-10 Description Diagnosis 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 2 diabetes mellitus with foot ulcer E11.622 Type 2 diabetes mellitus with other skin ulcer L97.511 Non-pressure chronic ulcer  of other part of right foot limited to breakdown of skin Physician Procedures CPT4: Description Modifier Quantity Code [id number] 11042 - WC PHYS SUBQ TISS 20 SQ CM 1 ICD-10 Description Diagnosis 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 2 diabetes mellitus with foot ulcer E11.622 Type 2 diabetes mellitus with other skin ulcer L97.511 Non-pressure chronic ulcer  of other part of right foot limited to breakdown of skin Electronic Signature(s) Signed: 07/26/2016 1:47:25 PM By: Evlyn Kanner MD, FACS Entered By: Evlyn Kanner on 07/26/2016 13:47:24

## 2016-08-02 ENCOUNTER — Ambulatory Visit: Payer: Medicare HMO | Admitting: Surgery

## 2016-08-05 ENCOUNTER — Encounter: Payer: Medicare HMO | Admitting: Surgery

## 2016-08-05 DIAGNOSIS — E11621 Type 2 diabetes mellitus with foot ulcer: Secondary | ICD-10-CM | POA: Diagnosis not present

## 2016-08-06 NOTE — Progress Notes (Signed)
ITXEL, WICKARD ([id number]) Visit Report for 08/05/2016 Arrival Information Details Patient Name: [name], [name]. Date of Service: 08/05/2016 3:45 PM Medical Record Number: [id number] Patient Account Number: [id number] Date of Birth/Sex: 1950/11/20 (66 y.o. Female) Treating RN: Huel Coventry Primary Care Emelda Kohlbeck: Tonia Ghent Other Clinician: Referring Svara Twyman: Tonia Ghent Treating Beauty Pless/Extender: Rudene Re in Treatment: 3 Visit Information History Since Last Visit Added or deleted any medications: No Patient Arrived: Ambulatory Any new allergies or adverse reactions: No Arrival Time: 16:03 Had a fall or experienced change in No Accompanied By: self activities of daily living that may affect Transfer Assistance: None risk of falls: Patient Identification Verified: Yes Signs or symptoms of abuse/neglect No Secondary Verification Process Yes since last visito Completed: Hospitalized since last visit: No Patient Requires Transmission-Based No Has Dressing in Place as Prescribed: Yes Precautions: Has Footwear/Offloading in Place as Yes Patient Has Alerts: Yes Prescribed: 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 Date of Service: 08/05/2016 3:45 PM Medical Record Number: [id number] Patient Account Number: [id number] Date of Birth/Sex: 1951/02/21 (66 y.o. Female) Treating RN: Huel Coventry Primary Care Marrell Dicaprio: Tonia Ghent Other Clinician: Referring Khila Papp: Tonia Ghent Treating Yaqueline Gutter/Extender: Rudene Re in Treatment: 3 Encounter Discharge Information Items Discharge Pain Level:  0 Discharge Condition: Stable Ambulatory Status: Cane Discharge Destination: Home Transportation: Private Auto Accompanied By: self Schedule Follow-up Appointment: Yes Medication Reconciliation completed Yes and provided to Patient/Care Amonte Brookover: Provided on Clinical Summary of Care: 08/05/2016 Form Type Recipient Paper Patient EP Electronic Signature(s) Signed: 08/05/2016 4:48:11 PM By: Elliot Gurney RN, BSN, [name], BSN Previous Signature: 08/05/2016 4:33:27 PM Version By: Gwenlyn Perking Entered By: Elliot Gurney RN, BSN, Kim on 08/05/2016 16:48:11 Marando, [name] ([id number]) -------------------------------------------------------------------------------- Lower Extremity Assessment Details Patient Name: [name], [name]. Date of Service: 08/05/2016 3:45 PM Medical Record Number: [id number] Patient Account Number: [id number] Date of Birth/Sex: 1950-11-09 (66 y.o. Female) Treating RN: Huel Coventry Primary Care Asia Dusenbury: Tonia Ghent Other Clinician: Referring Angle Dirusso: Tonia Ghent Treating Lyrical Sowle/Extender: Rudene Re in Treatment: 3 Vascular Assessment Pulses: Dorsalis Pedis Palpable: [Left:Yes] Posterior Tibial Palpable: [Left:Yes] Extremity colors, hair growth, and conditions: Extremity Color: [Left:Normal] Hair Growth on Extremity: [Left:No] Temperature of Extremity: [Left:Warm] Capillary Refill: [Left:< 3 seconds] Dependent Rubor: [Left:No] Blanched when Elevated: [Left:No] Lipodermatosclerosis: [Left:No] Toe Nail Assessment Left: Right: Thick: No Discolored: No Deformed: No Improper Length and Hygiene: No Electronic Signature(s) Signed: 08/05/2016 5:01:54 PM By: Elliot Gurney, RN, BSN, [name], BSN Entered By: Elliot Gurney, RN, BSN, Kim on 08/05/2016 16:16:09 Devine, [name] ([id number]) -------------------------------------------------------------------------------- Multi Wound Chart Details Patient Name: Kathleen Pigg. Date of Service: 08/05/2016 3:45 PM Medical Record Number:  [id number] Patient Account Number: [id number] Date of Birth/Sex: 14-Jun-1950 (66 y.o. Female) Treating RN: Huel Coventry Primary Care Eliakim Tendler: Tonia Ghent Other Clinician: Referring Brax Walen: Tonia Ghent Treating Emerlyn Mehlhoff/Extender: Rudene Re in Treatment: 3 Vital Signs Height(in): 63 Pulse(bpm): 90 Weight(lbs): 224 Blood Pressure 148/88 (mmHg): Body Mass Index(BMI): 40 Temperature(F): 98.5 Respiratory Rate 16 (breaths/min): Photos: [2:No Photos] [3:No Photos] [N/A:N/A] Wound Location: [2:Left, Lateral Lower Leg] [3:Left, Medial Toe Great] [N/A:N/A] Wounding Event: [2:Trauma] [3:Gradually Appeared] [N/A:N/A] Primary Etiology: [2:Diabetic Wound/Ulcer of the Lower Extremity] [3:Diabetic Wound/Ulcer of the Lower Extremity] [N/A:N/A] Secondary Etiology: [2:Trauma, Other] [3:Infection - not elsewhere classified] [N/A:N/A] Date  Acquired: [2:06/19/2016] [3:06/14/2016] [N/A:N/A] Weeks of Treatment: [2:3] [3:3] [N/A:N/A] Wound Status: [2:Healed - Epithelialized] [3:Open] [N/A:N/A] Clustered Wound: [2:Yes] [3:No] [N/A:N/A] Pending Amputation on No [3:Yes] [N/A:N/A] Presentation: Measurements L x W x D 0x0x0 [3:1.5x0.4x0.1] [N/A:N/A] (cm) Area (cm) : [2:0] [3:0.471] [N/A:N/A] Volume (cm) : [2:0] [3:0.047] [N/A:N/A] % Reduction in Area: [2:100.00%] [3:92.00%] [N/A:N/A] % Reduction in Volume: 100.00% [3:92.00%] [N/A:N/A] Classification: [2:Grade 1] [3:Grade 1] [N/A:N/A] Debridement: [2:N/A] [3:Debridement ([id number]([id number] 11047)] [N/A:N/A] Pre-procedure [2:N/A] [3:16:24] [N/A:N/A] Verification/Time Out Taken: Pain Control: [2:N/A] [3:Other] [N/A:N/A] Tissue Debrided: [2:N/A] [3:Subcutaneous] [N/A:N/A] Level: [2:N/A N/A] [3:Skin/Subcutaneous Tissue 0.6] [N/A:N/A N/A] Debridement Area (sq cm): Instrument: N/A Curette N/A Bleeding: N/A Minimum N/A Hemostasis Achieved: N/A Pressure N/A Procedural Pain: N/A 4 N/A Post Procedural Pain: N/A 4 N/A Debridement Treatment N/A Procedure  was tolerated N/A Response: well Post Debridement N/A 1.5x0.4x0.2 N/A Measurements L x W x D (cm) Post Debridement N/A 0.094 N/A Volume: (cm) Periwound Skin Texture: No Abnormalities Noted No Abnormalities Noted N/A Periwound Skin No Abnormalities Noted No Abnormalities Noted N/A Moisture: Periwound Skin Color: No Abnormalities Noted No Abnormalities Noted N/A Tenderness on No No N/A Palpation: Procedures Performed: N/A Debridement N/A Treatment Notes Electronic Signature(s) Signed: 08/05/2016 4:38:37 PM By: Evlyn KannerBritto, Errol MD, FACS Entered By: Evlyn KannerBritto, Errol on 08/05/2016 16:38:36 Swindler, [name] ([id number]) -------------------------------------------------------------------------------- Multi-Disciplinary Care Plan Details Patient Name: [name], [name] Date of Service: 08/05/2016 3:45 PM Medical Record Number: [id number] Patient Account Number: [id number] Date of Birth/Sex: June 30, 1950 (66 y.o. Female) Treating RN: Huel CoventryWoody, Kim Primary Care Donnell Wion: Tonia GhentBENDER, ABBY Other Clinician: Referring Genelda Roark: Tonia GhentBENDER, ABBY Treating Daylani Deblois/Extender: Rudene ReBritto, Errol Weeks in Treatment: 3 Active Inactive ` Abuse / Safety / Falls / Self Care Management Nursing Diagnoses: Potential for falls Goals: Patient will remain injury free Date Initiated: 07/12/2016 Target Resolution Date: 10/26/2016 Goal Status: Active Interventions: Assess fall risk on admission and as needed Assess impairment of mobility on admission and as needed per policy Notes: ` Nutrition Nursing Diagnoses: Imbalanced nutrition Impaired glucose control: actual or potential Potential for alteratiion in Nutrition/Potential for imbalanced nutrition Goals: Patient/caregiver agrees to and verbalizes understanding of need to use nutritional supplements and/or vitamins as prescribed Date Initiated: 07/12/2016 Target Resolution Date: 09/28/2016 Goal Status: Active Interventions: Assess patient nutrition upon admission and as  needed per policy Provide education on elevated blood sugars and impact on wound healing Notes: [name], [name] ([id number]) Orientation to the Wound Care Program Nursing Diagnoses: Knowledge deficit related to the wound healing center program Goals: Patient/caregiver will verbalize understanding of the Wound Healing Center Program Date Initiated: 07/12/2016 Target Resolution Date: 07/27/2016 Goal Status: Active Interventions: Provide education on orientation to the wound center Notes: ` Pain, Acute or Chronic Nursing Diagnoses: Pain, acute or chronic: actual or potential Potential alteration in comfort, pain Goals: Patient/caregiver will verbalize adequate pain control between visits Date Initiated: 07/12/2016 Target Resolution Date: 10/26/2016 Goal Status: Active Interventions: Assess comfort goal upon admission Complete pain assessment as per visit requirements Notes: ` Soft Tissue Infection Nursing Diagnoses: Impaired tissue integrity Knowledge deficit related to disease process and management Knowledge deficit related to home infection control: handwashing, handling of soiled dressings, supply storage Goals: Patient/caregiver will verbalize understanding of or measures to prevent infection and contamination in the home setting Date Initiated: 07/12/2016 Target Resolution Date: 09/28/2016 Goal Status: Active [name], Zipporah J. ([id number]) Interventions: Assess signs and symptoms of infection every visit Notes: ` Wound/Skin Impairment Nursing Diagnoses: Impaired tissue integrity Knowledge deficit related to smoking impact on wound healing Knowledge deficit related  to ulceration/compromised skin integrity Goals: Ulcer/skin breakdown will have a volume reduction of 80% by week 12 Date Initiated: 07/12/2016 Target Resolution Date: 10/26/2016 Goal Status: Active Interventions: Assess patient/caregiver ability to perform ulcer/skin care regimen upon admission and as  needed Assess ulceration(s) every visit Provide education on smoking Notes: Electronic Signature(s) Signed: 08/05/2016 5:01:54 PM By: Elliot Gurney, RN, BSN, [name], BSN Entered By: Elliot Gurney, RN, BSN, Kim on 08/05/2016 16:17:21 Quevedo, [name] ([id number]) -------------------------------------------------------------------------------- Pain Assessment Details Patient Name: AILEANA, HODDER. Date of Service: 08/05/2016 3:45 PM Medical Record Number: [id number] Patient Account Number: [id number] Date of Birth/Sex: June 04, 1950 (66 y.o. Female) Treating RN: Huel Coventry Primary Care Karey Suthers: Tonia Ghent Other Clinician: Referring Everley Evora: Tonia Ghent Treating Seairra Otani/Extender: Rudene Re in Treatment: 3 Active Problems Location of Pain Severity and Description of Pain Patient Has Paino Yes Site Locations Pain Location: Pain in Ulcers With Dressing Change: Yes Duration of the Pain. Constant / Intermittento Constant Rate the pain. Current Pain Level: 8 Worst Pain Level: 8 Character of Pain Describe the Pain: Aching, Burning, Sharp, Shooting Pain Management and Medication Current Pain Management: Goals for Pain Management Topical or injectable lidocaine is offered to patient for acute pain when surgical debridement is performed. If needed, Patient is instructed to use over the counter pain medication for the following 24-48 hours after debridement. Wound care MDs do not prescribed pain medications. Patient has chronic pain or uncontrolled pain. Patient has been instructed to make an appointment with their Primary Care Physician for pain management. Electronic Signature(s) Signed: 08/05/2016 5:01:54 PM By: Elliot Gurney, RN, BSN, [name], BSN Entered By: Elliot Gurney, RN, BSN, Kim on 08/05/2016 16:08:20 Pupo, [name] ([id number]) -------------------------------------------------------------------------------- Patient/Caregiver Education Details Patient Name: [name], [name]. Date of Service:  08/05/2016 3:45 PM Medical Record Number: [id number] Patient Account Number: [id number] Date of Birth/Gender: 03-Apr-1950 (66 y.o. Female) Treating RN: Huel Coventry Primary Care Physician: Tonia Ghent Other Clinician: Referring Physician: Tonia Ghent Treating Physician/Extender: Rudene Re in Treatment: 3 Education Assessment Education Provided To: Patient Education Topics Provided Smoking and Wound Healing: Handouts: Smoking and Wound Healing Methods: Explain/Verbal Responses: State content correctly Wound/Skin Impairment: Handouts: Caring for Your Ulcer Methods: Demonstration, Explain/Verbal Responses: State content correctly Electronic Signature(s) Signed: 08/05/2016 5:01:54 PM By: Elliot Gurney, RN, BSN, [name], BSN Entered By: Elliot Gurney, RN, BSN, Kim on 08/05/2016 16:48:38 Yerger, [name] ([id number]) -------------------------------------------------------------------------------- Wound Assessment Details Patient Name: [name], [name]. Date of Service: 08/05/2016 3:45 PM Medical Record Number: [id number] Patient Account Number: [id number] Date of Birth/Sex: 07-19-1950 (66 y.o. Female) Treating RN: Huel Coventry Primary Care Melek Pownall: Tonia Ghent Other Clinician: Referring Dorothyann Mourer: Tonia Ghent Treating Kinslei Labine/Extender: Rudene Re in Treatment: 3 Wound Status Wound Number: 2 Primary Etiology: Diabetic Wound/Ulcer of the Lower Extremity Wound Location: Left, Lateral Lower Leg Secondary Trauma, Other Wounding Event: Trauma Etiology: Date Acquired: 06/19/2016 Wound Status: Healed - Epithelialized Weeks Of Treatment: 3 Clustered Wound: Yes Photos Photo Uploaded By: Elliot Gurney, RN, BSN, Kim on 08/05/2016 16:57:46 Wound Measurements Length: (cm) 0 % Reduction i Width: (cm) 0 % Reduction i Depth: (cm) 0 Area: (cm) 0 Volume: (cm) 0 n Area: 100% n Volume: 100% Wound Description Classification: Grade 1 Periwound Skin Texture Texture Color No Abnormalities Noted:  No No Abnormalities Noted: No Moisture No Abnormalities Noted: No Electronic Signature(s) Signed: 08/05/2016 5:01:54 PM By: Elliot Gurney, RN, BSN, [name], BSN Entered By: Elliot Gurney, RN, BSN, Kim on 08/05/2016 16:24:30 Adger, [name] ([id number]) -------------------------------------------------------------------------------- Wound Assessment Details Patient Name: [name], [name]. Date of  Service: 08/05/2016 3:45 PM Medical Record Number: [id number] Patient Account Number: [id number] Date of Birth/Sex: 03-26-1950 (66 y.o. Female) Treating RN: Huel Coventry Primary Care Jaymes Revels: Tonia Ghent Other Clinician: Referring Jarmel Linhardt: Tonia Ghent Treating Temeka Pore/Extender: Rudene Re in Treatment: 3 Wound Status Wound Number: 3 Primary Etiology: Diabetic Wound/Ulcer of the Lower Extremity Wound Location: Left, Medial Toe Great Secondary Infection - not elsewhere classified Wounding Event: Gradually Appeared Etiology: Date Acquired: 06/14/2016 Wound Status: Open Weeks Of Treatment: 3 Clustered Wound: No Pending Amputation On Presentation Photos Photo Uploaded By: Elliot Gurney, RN, BSN, Kim on 08/05/2016 16:57:46 Wound Measurements Length: (cm) 1.5 Width: (cm) 0.4 Depth: (cm) 0.1 Area: (cm) 0.471 Volume: (cm) 0.047 % Reduction in Area: 92% % Reduction in Volume: 92% Wound Description Classification: Grade 1 Periwound Skin Texture Texture Color No Abnormalities Noted: No No Abnormalities Noted: No Moisture No Abnormalities Noted: No Treatment Notes Wound #3 (Left, Medial Toe Great) 1. Cleansed with: Clean wound with Normal Saline Frayne, Latifah J. ([id number]) 2. Anesthetic Topical Lidocaine 4% cream to wound bed prior to debridement 4. Dressing Applied: Santyl Ointment 5. Secondary Dressing Applied Kerlix/Conform [place] 7. 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 Date of Service: 08/05/2016 3:45 PM Medical Record Number: [id number] Patient Account Number: [id number] Date of Birth/Sex: [birth year] (66 y.o. Female) Treating RN: Huel Coventry Primary Care Annia Gomm: Tonia Ghent Other Clinician: Referring Anicka Stuckert: Tonia Ghent Treating Stillman Buenger/Extender: Rudene Re in Treatment: 3 Vital Signs Time Taken: 16:09 Temperature (F): 98.5 Height (in): 63 Pulse (bpm): 90 Weight (lbs): 224 Respiratory Rate (breaths/min): 16 Body Mass Index (BMI): 39.7 Blood Pressure (mmHg): 148/88 Reference Range: 80 - 120 mg / dl Electronic Signature(s) Signed: 08/05/2016 5:01:54 PM By: Elliot Gurney, RN, BSN, [name], BSN Entered By: Elliot Gurney, RN, BSN, Kim on 08/05/2016 16:13:56

## 2016-08-06 NOTE — Progress Notes (Signed)
DORTHA, NEIGHBORS ([id number]) Visit Report for 08/05/2016 Chief Complaint Document Details Patient Name: [name], [name]. Date of Service: 08/05/2016 3:45 PM Medical Record Number: [id number] Patient Account Number: [id number] Date of Birth/Sex: 22-Jul-1950 (66 y.o. Female) Treating RN: Huel Coventry Primary Care Provider: Tonia Ghent Other Clinician: Referring Provider: Tonia Ghent Treating Provider/Extender: Rudene Re in Treatment: 3 Information Obtained from: Patient Chief Complaint [name] presents today for evaluation of her left medial hallux and left lateral lower extremity ulcers Electronic Signature(s) Signed: 08/05/2016 4:38:59 PM By: Evlyn Kanner MD, FACS Entered By: Evlyn Kanner on 08/05/2016 16:38:59 Prowell, [name] ([id number]) -------------------------------------------------------------------------------- Debridement Details Patient Name: [name]. Date of Service: 08/05/2016 3:45 PM Medical Record Number: [id number] Patient Account Number: [id number] Date of Birth/Sex: April 24, 1950 (66 y.o. Female) Treating RN: Huel Coventry Primary Care Provider: Tonia Ghent Other Clinician: Referring Provider: Tonia Ghent Treating Provider/Extender: Rudene Re in Treatment: 3 Debridement Performed for Wound #3 Left,Medial Toe Great Assessment: Performed By: Physician Evlyn Kanner, MD Debridement: Debridement Pre-procedure Yes - 16:24 Verification/Time Out Taken: Start Time: 16:25 Pain Control: Other : lidocaine 4% Level: Skin/Subcutaneous Tissue Total Area Debrided (L x 1.5 (cm) x 0.4 (cm) = 0.6 (cm) W): Tissue and other Viable, Non-Viable, Subcutaneous material debrided: Instrument: Curette Bleeding: Minimum Hemostasis Achieved: Pressure End Time: 16:25 Procedural Pain: 4 Post Procedural Pain: 4 Response to Treatment: Procedure was tolerated well Post Debridement Measurements of Total Wound Length: (cm) 1.5 Width: (cm) 0.4 Depth: (cm)  0.2 Volume: (cm) 0.094 Character of Wound/Ulcer Post Requires Further Debridement Debridement: Severity of Tissue Post Debridement: Fat layer exposed Post Procedure Diagnosis Same as Pre-procedure Electronic Signature(s) Signed: 08/05/2016 4:38:52 PM By: Evlyn Kanner MD, FACS Signed: 08/05/2016 5:01:54 PM By: Elliot Gurney, RN, BSN, [name], BSN Entered By: Evlyn Kanner on 08/05/2016 16:38:52 [name], [name] ([id number]) [name], [name] ([id number]) -------------------------------------------------------------------------------- HPI Details Patient Name: [name]. Date of Service: 08/05/2016 3:45 PM Medical Record Number: [id number] Patient Account Number: [id number] Date of Birth/Sex: [birth year] (66 y.o. Female) Treating RN: Huel Coventry Primary Care Provider: Tonia Ghent Other Clinician: Referring Provider: Tonia Ghent Treating Provider/Extender: Rudene Re in Treatment: 3 History of Present Illness Location: left medial hallux; LLE lateral Duration: left medial hallux-4 weeks; LLE lateral-2-1/2 weeks Timing: left medial hallux-pain with pressure/standing, ambulating, manipulation; LLE lateral-pain with manipulation Context: left medial hallux-originally was a crack in the skin; LLE lateral-traumatic injury with car door Modifying Factors: uncontrolled diabetes and smoking are exacerbating factors to wound healing HPI Description: 07/12/16- the patient arrives for initial evaluation of a left hallux and left lower extremity lateral ulcer. She states that the left hallux originally had a crack in the skin which she began to apply AandD ointment. She went to her PCP at [facility] on 4/4, while going into her appointment she struck her left lateral leg on the car door. At that appointment she was given a prescription for Bactrim and completed that on 4/14 with no adverse reaction. She states that it made little to no improvement in her toe wound. She went  back for a follow-up visit on 4/18 and was prescribed doxycycline for 10 days. She continues to apply AandD topical ointment along with soaking her foot in warm water. She is a diabetic, most recent A1c of 9% (I do not have records of this, this is per patient report). She is both on oral agents and insulin. She is a current smoker, approximately 0.5 ppd. She denies any remote or  recent evaluation regarding arterial or venous disease. 07/19/16 Patient presents today for follow-up evaluation concerning her left great toe wound immediately and left lateral lower extremity wound. Unfortunately though both wounds appear better visually she is having a lot of discomfort. I'm unsure if this is a preformed drop of the issue or potentially the ideas or appointment is causing irritation which has caused her discomfort and pain. The good news is her wound culture came back negative and showed only normal skin flora and her x-ray of the left foot was also negative for any osseous change or osteomyelitis. Unfortunately she continues to have a lot of discomfort she would rate it a seven out of 10 right now. 07/26/2016 -- she is doing much better since the Iodoflex was stopped and she is on Santyl. Her culture report was negative and only had normal skin flora. She continues to smoke about a half pack of cigarettes a day. 08/05/2016 -- she is doing much better with her smoking and is down to about 5 cigarettes a day. The left lower extremity wound is healed and she continues to have a lot of tenderness on her left big toe. In the course of conversation we realize she was using alcohol to clean her left big toe and we have told her to stop this Electronic Signature(s) Signed: 08/05/2016 4:39:38 PM By: Evlyn Kanner MD, FACS Entered By: Evlyn Kanner on 08/05/2016 16:39:38 [name], [name] [name], [name]  ([id number]) -------------------------------------------------------------------------------- Physical Exam Details Patient Name: [name], [name]. Date of Service: 08/05/2016 3:45 PM Medical Record Number: [id number] Patient Account Number: [id number] Date of Birth/Sex: 1950/04/30 (66 y.o. Female) Treating RN: Huel Coventry Primary Care Provider: Tonia Ghent Other Clinician: Referring Provider: Tonia Ghent Treating Provider/Extender: Rudene Re in Treatment: 3 Constitutional . Pulse regular. Respirations normal and unlabored. Afebrile. . Eyes Nonicteric. Reactive to light. Ears, Nose, Mouth, and Throat Lips, teeth, and gums WNL.Marland Kitchen Moist mucosa without lesions. Neck supple and nontender. No palpable supraclavicular or cervical adenopathy. Normal sized without goiter. Respiratory WNL. No retractions.. Breath sounds WNL, No rubs, rales, rhonchi, or wheeze.. Cardiovascular Heart rhythm and rate regular, no murmur or gallop.. Pedal Pulses WNL. No clubbing, cyanosis or edema. Chest Breasts symmetical and no nipple discharge.. Breast tissue WNL, no masses, lumps, or tenderness.. Lymphatic No adneopathy. No adenopathy. No adenopathy. Musculoskeletal Adexa without tenderness or enlargement.. Digits and nails w/o clubbing, cyanosis, infection, petechiae, ischemia, or inflammatory conditions.. Integumentary (Hair, Skin) No suspicious lesions. No crepitus or fluctuance. No peri-wound warmth or erythema. No masses.Marland Kitchen Psychiatric Judgement and insight Intact.. No evidence of depression, anxiety, or agitation.. Notes the left lateral calf wound has healed. the left medial big toe wound had significant tenderness but I was able to debride this with a #3 curet and minimal bleeding controlled with pressure Electronic Signature(s) Signed: 08/05/2016 4:40:20 PM By: Evlyn Kanner MD, FACS Entered By: Evlyn Kanner on 08/05/2016 16:40:20 [name], [name]  ([id number]) -------------------------------------------------------------------------------- Physician Orders Details Patient Name: [name]. Date of Service: 08/05/2016 3:45 PM Medical Record Number: [id number] Patient Account Number: [id number] Date of Birth/Sex: Jan 17, 1951 (66 y.o. Female) Treating RN: Huel Coventry Primary Care Provider: Tonia Ghent Other Clinician: Referring Provider: Tonia Ghent Treating Provider/Extender: Rudene Re in Treatment: 3 Verbal / Phone Orders: No Diagnosis Coding Wound Cleansing Wound #3 Left,Medial Toe Great o Clean wound with Normal Saline. o Cleanse wound with mild soap and water Anesthetic Wound #3 Left,Medial Toe Great o Topical Lidocaine 4% cream applied to wound  bed prior to debridement Skin Barriers/Peri-Wound Care Wound #3 Left,Medial Toe Great o Skin Prep Primary Wound Dressing Wound #3 Left,Medial Toe Great o Santyl Ointment Secondary Dressing Wound #3 Left,Medial Toe Great o Dry Gauze o Gauze and Kerlix/Conform o Other - tape Dressing Change Frequency Wound #3 Left,Medial Toe Great o Change dressing every day. o Other: - PRN Follow-up Appointments Wound #3 Left,Medial Toe Great o Return Appointment in 1 week. Edema Control Wound #3 Left,Medial Toe Great o Elevate legs to the level of the heart and pump ankles as often as possible Elbe, Khamia J. ([id number]) Additional Orders / Instructions Wound #3 Left,Medial Toe Great o Stop Smoking o Increase protein intake. Medications-please add to medication list. Wound #3 Left,Medial Toe Great o Santyl Enzymatic Ointment Electronic Signature(s) Signed: 08/05/2016 4:48:28 PM By: Evlyn Kanner MD, FACS Signed: 08/05/2016 5:01:54 PM By: Elliot Gurney RN, BSN, [name], BSN Entered By: Elliot Gurney, RN, BSN, Kim on 08/05/2016 16:33:07 [name], [name] ([id number]) -------------------------------------------------------------------------------- Problem List  Details Patient Name: NORRINE, BALLESTER. Date of Service: 08/05/2016 3:45 PM Medical Record Number: [id number] Patient Account Number: [id number] Date of Birth/Sex: January 09, 1951 (66 y.o. 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 Date of Service: 08/05/2016 3:45 PM Medical Record Number: [id number] Patient Account Number: [id number] Date of Birth/Sex: [birth year] (66 y.o. Female) Treating RN: Huel Coventry Primary Care Provider: Tonia Ghent Other Clinician: Referring Provider: Tonia Ghent Treating Provider/Extender: Rudene Re in Treatment: 3 Subjective Chief Complaint Information obtained from Patient Mrs. Wilcher presents today for evaluation of her left medial hallux and left lateral lower extremity ulcers History of Present Illness (HPI) The following HPI elements were documented for the patient's wound: Location: left medial hallux; LLE lateral Duration: left medial hallux-4 weeks; LLE lateral-2-1/2 weeks Timing: left medial  hallux-pain with pressure/standing, ambulating, manipulation; LLE lateral-pain with manipulation Context: left medial hallux-originally was a crack in the skin; LLE lateral-traumatic injury with car door Modifying Factors: uncontrolled diabetes and smoking are exacerbating factors to wound healing 07/12/16- the patient arrives for initial evaluation of a left hallux and left lower extremity lateral ulcer. She states that the left hallux originally had a crack in the skin which she began to apply AandD ointment. She went to her PCP at [facility] on 4/4, while going into her appointment she struck her left lateral leg on the car door. At that appointment she was given a prescription for Bactrim and completed that on 4/14 with no adverse reaction. She states that it made little to no improvement in her toe wound. She went back for a follow-up visit on 4/18 and was prescribed doxycycline for 10 days. She continues to apply AandD topical ointment along with soaking her foot in warm water. She is a diabetic, most recent A1c of 9% (  I do not have records of this, this is per patient report). She is both on oral agents and insulin. She is a current smoker, approximately 0.5 ppd. She denies any remote or recent evaluation regarding arterial or venous disease. 07/19/16 Patient presents today for follow-up evaluation concerning her left great toe wound immediately and left lateral lower extremity wound. Unfortunately though both wounds appear better visually she is having a lot of discomfort. I'm unsure if this is a preformed drop of the issue or potentially the ideas or appointment is causing irritation which has caused her discomfort and pain. The good news is her wound culture came back negative and showed only normal skin flora and her x-ray of the left foot was also negative for any osseous change or osteomyelitis. Unfortunately she continues to have a lot of discomfort she would rate it  a seven out of 10 right now. 07/26/2016 -- she is doing much better since the Iodoflex was stopped and she is on Santyl. Her culture report was negative and only had normal skin flora. She continues to smoke about a half pack of cigarettes a day. 08/05/2016 -- she is doing much better with her smoking and is down to about 5 cigarettes a day. The left lower extremity wound is healed and she continues to have a lot of tenderness on her left big toe. In the [name], [name] course of conversation we realize she was using alcohol to clean her left big toe and we have told her to stop this Objective Constitutional Pulse regular. Respirations normal and unlabored. Afebrile. Vitals Time Taken: 4:09 PM, Height: 63 in, Weight: 224 lbs, BMI: 39.7, Temperature: 98.5 F, Pulse: 90 bpm, Respiratory Rate: 16 breaths/min, Blood Pressure: 148/88 mmHg. Eyes Nonicteric. Reactive to light. Ears, Nose, Mouth, and Throat Lips, teeth, and gums WNL.Marland Kitchen Moist mucosa without lesions. Neck supple and nontender. No palpable supraclavicular or cervical adenopathy. Normal sized without goiter. Respiratory WNL. No retractions.. Breath sounds WNL, No rubs, rales, rhonchi, or wheeze.. Cardiovascular Heart rhythm and rate regular, no murmur or gallop.. Pedal Pulses WNL. No clubbing, cyanosis or edema. Chest Breasts symmetical and no nipple discharge.. Breast tissue WNL, no masses, lumps, or tenderness.. Lymphatic No adneopathy. No adenopathy. No adenopathy. Musculoskeletal Adexa without tenderness or enlargement.. Digits and nails w/o clubbing, cyanosis, infection, petechiae, ischemia, or inflammatory conditions.Marland Kitchen Psychiatric Judgement and insight Intact.. No evidence of depression, anxiety, or agitation.. General Notes: the left lateral calf wound has healed. the left medial big toe wound had significant tenderness but I was able to debride this with a #3 curet and minimal bleeding controlled with  pressure Integumentary (Hair, Skin) Burleson, Dannon J. ([id number]) No suspicious lesions. No crepitus or fluctuance. No peri-wound warmth or erythema. No masses.. Wound #2 status is Healed - Epithelialized. Original cause of wound was Trauma. The wound is located on the Left,Lateral Lower Leg. The wound measures 0cm length x 0cm width x 0cm depth; 0cm^2 area and 0cm^3 volume. Wound #3 status is Open. Original cause of wound was Gradually Appeared. The wound is located on the [employer]. The wound measures 1.5cm length x 0.4cm width x 0.1cm depth; 0.471cm^2 area and 0.047cm^3 volume. Assessment Active Problems ICD-10 E11.621 - Type 2 diabetes mellitus with foot ulcer E11.622 - Type 2 diabetes mellitus with other skin ulcer L97.511 - Non-pressure chronic ulcer of other part of right foot limited to breakdown of skin L97.212 - Non-pressure chronic ulcer of right calf with fat layer exposed F17.218 -  Nicotine dependence, cigarettes, with other nicotine-induced disorders Procedures Wound #3 Wound #3 is a Diabetic Wound/Ulcer of the Lower Extremity located on the Left,Medial Toe Great . There was a Skin/Subcutaneous Tissue Debridement (11042-11047) debridement with total area of 0.6 sq cm performed by Evlyn KannerBritto, Salote Weidmann, MD. with the following instrument(s): Curette to remove Viable and Non-Vi([id number] tissue/material including Subcutaneous after achieving pain control using Other (lidocaine 4%). A time out was conducted at 16:24, prior to the start of the procedure. A Minimum amount of bleeding was controlled with Pressure. The procedure was tolerated well with a pain level of 4 throughout and a pain level of 4 following the procedure. Post Debridement Measurements: 1.5cm length x 0.4cm width x 0.2cm depth; 0.094cm^3 volume. Character of Wound/Ulcer Post Debridement requires further debridement. Severity of Tissue Post Debridement is: Fat layer exposed. Post procedure Diagnosis Wound #3: Same  as Pre-Procedure Rippeon, Zuly J. ([id number]) Plan Wound Cleansing: Wound #3 Left,Medial Toe Great: Clean wound with Normal Saline. Cleanse wound with mild soap and water Anesthetic: Wound #3 Left,Medial Toe Great: Topical Lidocaine 4% cream applied to wound bed prior to debridement Skin Barriers/Peri-Wound Care: Wound #3 Left,Medial Toe Great: Skin Prep Primary Wound Dressing: Wound #3 Left,Medial Toe Great: Santyl Ointment Secondary Dressing: Wound #3 Left,Medial Toe Great: Dry Gauze Gauze and Kerlix/Conform Other - tape Dressing Change Frequency: Wound #3 Left,Medial Toe Great: Change dressing every day. Other: - PRN Follow-up Appointments: Wound #3 Left,Medial Toe Great: Return Appointment in 1 week. Edema Control: Wound #3 Left,Medial Toe Great: Elevate legs to the level of the heart and pump ankles as often as possible Additional Orders / Instructions: Wound #3 Left,Medial Toe Great: Stop Smoking Increase protein intake. Medications-please add to medication list.: Wound #3 Left,Medial Toe Great: Santyl Enzymatic Ointment after a review and sharp debridement of her wound, I have also recommended: 1. Santyl ointment to be continued to the left big toe daily. 2. Elevation and exercise as she has minimal pedal edema 3. Good control of her diabetes mellitus [name], [name] ([id number]) 4. continue to work on stopping cigarette smoking 5. Adequate protein, vitamin A, vitamin C and zinc 6. regular visits the wound center Electronic Signature(s) Signed: 08/05/2016 4:41:23 PM By: Evlyn KannerBritto, Elayne Gruver MD, FACS Entered By: Evlyn KannerBritto, Verdine Grenfell on 08/05/2016 16:41:23 Conger, [name] ([id number]) -------------------------------------------------------------------------------- SuperBill Details Patient Name: Kathleen PiggPARRIS, [name] Date of Service: 08/05/2016 Medical Record Number: [id number] Patient Account Number: [id number] Date of Birth/Sex: 09-27-1950 (66 y.o. Female) Treating RN: Huel CoventryWoody,  Kim Primary Care Provider: Tonia GhentBENDER, ABBY Other Clinician: Referring Provider: Tonia GhentBENDER, ABBY Treating Provider/Extender: Rudene ReBritto, Ambriel Gorelick Weeks in Treatment: 3 Diagnosis Coding ICD-10 Codes Code Description 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 2 diabetes mellitus with foot ulcer E11.622 Type 2 diabetes mellitus with other skin ulcer L97.511 Non-pressure chronic ulcer of other part of right foot limited to breakdown of skin L97.212 Non-pressure chronic ulcer of right calf with fat layer exposed F17.218 Nicotine dependence, cigarettes, with other nicotine-induced disorders Facility Procedures CPT4: Description Modifier Quantity Code [id number] 11042 - DEB SUBQ TISSUE 20 SQ CM/< 1 ICD-10 Description Diagnosis 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 2 diabetes mellitus with foot ulcer L97.511 Non-pressure chronic ulcer of other part of right foot limited to breakdown of  skin L97.212 Non-pressure chronic ulcer of right calf with fat layer exposed F17.218 Nicotine dependence, cigarettes, with other nicotine-induced disorders Physician Procedures CPT4: Description Modifier Quantity Code [id number] 11042 - WC PHYS SUBQ TISS 20 SQ CM 1 ICD-10 Description Diagnosis 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 2 diabetes mellitus with foot ulcer L97.511 Non-pressure chronic ulcer of other  part of right foot limited to breakdown of  skin L97.212 Non-pressure chronic ulcer of right calf with fat layer exposed F17.218 Nicotine dependence, cigarettes, with other nicotine-induced disorders Electronic Signature(s) Signed: 08/05/2016 4:41:36 PM By: Evlyn Kanner MD, FACS [place], Ethelreda JMarland Kitchen ([id number]) Entered By: Evlyn Kanner on 08/05/2016 16:41:36

## 2016-08-09 ENCOUNTER — Encounter: Payer: Medicare HMO | Admitting: Surgery

## 2016-08-16 ENCOUNTER — Encounter: Payer: Medicare HMO | Admitting: Surgery

## 2016-08-16 DIAGNOSIS — E11621 Type 2 diabetes mellitus with foot ulcer: Secondary | ICD-10-CM | POA: Diagnosis not present

## 2016-08-17 NOTE — Progress Notes (Addendum)
[name], Bruna J. ([id number]) Visit Report for 08/16/2016 Arrival Information Details Patient Name: [name], Jorgia J. Date of Service: 08/16/2016 11:15 AM Medical Record Number: [id number] Patient Account Number: [id number] Date of Birth/Sex: [birth year] (66 y.o. Female) Treating RN: Afful, RN, BSN, [place] Sinkita Primary Care Harmoney Sienkiewicz: Tonia GhentBENDER, ABBY Other Clinician: Referring Maleiah Dula: Tonia GhentBENDER, ABBY Treating Nanci Lakatos/Extender: Rudene ReBritto, Errol Weeks in Treatment: 5 Visit Information History Since Last Visit All ordered tests and consults were completed: No Patient Arrived: Ambulatory Added or deleted any medications: No Arrival Time: 11:10 Any new allergies or adverse reactions: No Accompanied By: self Had a fall or experienced change in No Transfer Assistance: None activities of daily living that may affect Patient Identification Verified: Yes risk of falls: Secondary Verification Process Yes Signs or symptoms of abuse/neglect since last No Completed: visito Patient Requires Transmission-Based No Hospitalized since last visit: No Precautions: Has Dressing in Place as Prescribed: Yes Patient Has Alerts: Yes Pain Present Now: No Patient Alerts: Dm II Electronic Signature(s) Signed: 08/16/2016 11:11:12 AM By: Elpidio EricAfful, Rita BSN, RN Entered By: Elpidio EricAfful, Rita on 08/16/2016 11:11:12 Eisenbeis, [name] ([id number]) -------------------------------------------------------------------------------- Encounter Discharge Information Details Patient Name: [name], Antara J. Date of Service: 08/16/2016 11:15 AM Medical Record Number: [id number] Patient Account Number: [id number] Date of Birth/Sex: [birth year] (66 y.o. Female) Treating RN: Afful, RN, BSN, [place] Sinkita Primary Care Jordanna Hendrie: Tonia GhentBENDER, ABBY Other Clinician: Referring Christiann Hagerty: Tonia GhentBENDER, ABBY Treating Lavarr President/Extender: Rudene ReBritto, Errol Weeks in Treatment: 5 Encounter Discharge Information Items Discharge Pain Level: 0 Discharge Condition: Stable Ambulatory Status:  Cane Discharge Destination: Home Transportation: Private Auto Accompanied By: self Schedule Follow-up Appointment: No Medication Reconciliation completed No and provided to Patient/Care Avram Danielson: Provided on Clinical Summary of Care: 08/16/2016 Form Type Recipient Paper Patient EP Electronic Signature(s) Signed: 08/16/2016 11:47:47 AM By: Elpidio EricAfful, Rita BSN, RN Previous Signature: 08/16/2016 11:33:10 AM Version By: Gwenlyn PerkingMoore, Shelia Entered By: Elpidio EricAfful, Rita on 08/16/2016 11:33:45 Sadiq, [name] ([id number]) -------------------------------------------------------------------------------- Lower Extremity Assessment Details Patient Name: [name], Starkisha J. Date of Service: 08/16/2016 11:15 AM Medical Record Number: [id number] Patient Account Number: [id number] Date of Birth/Sex: [birth year] (66 y.o. Female) Treating RN: Afful, RN, BSN, [place] Sinkita Primary Care Jazminn Pomales: Tonia GhentBENDER, ABBY Other Clinician: Referring Solana Coggin: Tonia GhentBENDER, ABBY Treating Claudene Gatliff/Extender: Rudene ReBritto, Errol Weeks in Treatment: 5 Edema Assessment Assessed: [Left: No] [Right: No] Edema: [Left: N] [Right: o] Vascular Assessment Claudication: Claudication Assessment [Left:None] Pulses: Dorsalis Pedis Palpable: [Left:Yes] Posterior Tibial Extremity colors, hair growth, and conditions: Extremity Color: [Left:Normal] Temperature of Extremity: [Left:Warm] Capillary Refill: [Left:< 3 seconds] Electronic Signature(s) Signed: 08/16/2016 11:11:49 AM By: Elpidio EricAfful, Rita BSN, RN Entered By: Elpidio EricAfful, Rita on 08/16/2016 11:11:49 Mcnease, Harjit J. ([id number]) -------------------------------------------------------------------------------- Multi Wound Chart Details Patient Name: [name], Shyia J. Date of Service: 08/16/2016 11:15 AM Medical Record Number: [id number] Patient Account Number: [id number] Date of Birth/Sex: [birth year] (66 y.o. Female) Treating RN: Afful, RN, BSN, [place] Sinkita Primary Care Miracle Criado: Tonia GhentBENDER, ABBY Other Clinician: Referring Babatunde Seago:  Tonia GhentBENDER, ABBY Treating Logann Whitebread/Extender: Rudene ReBritto, Errol Weeks in Treatment: 5 Vital Signs Height(in): 63 Pulse(bpm): 89 Weight(lbs): 224 Blood Pressure 125/65 (mmHg): Body Mass Index(BMI): 40 Temperature(F): 98.2 Respiratory Rate 18 (breaths/min): Photos: [3:No Photos] [N/A:N/A] Wound Location: [3:Left Toe Great - Medial] [N/A:N/A] Wounding Event: [3:Gradually Appeared] [N/A:N/A] Primary Etiology: [3:Diabetic Wound/Ulcer of the Lower Extremity] [N/A:N/A] Secondary Etiology: [3:Infection - not elsewhere classified] [N/A:N/A] Comorbid History: [3:Asthma, Chronic Obstructive Pulmonary Disease (COPD), Congestive Heart Failure, Hypertension, Type II Diabetes, Osteoarthritis, Neuropathy] [N/A:N/A] Date Acquired: [3:06/14/2016] [N/A:N/A] Weeks of Treatment: [3:5] [N/A:N/A] Wound Status: [3:Open] [N/A:N/A] Pending Amputation on Yes [N/A:N/A] Presentation: Measurements L x W  x D 1.5x0.3x0.1 [N/A:N/A] (cm) Area (cm) : [3:0.353] [N/A:N/A] Volume (cm) : [3:0.035] [N/A:N/A] % Reduction in Area: [3:94.00%] [N/A:N/A] % Reduction in Volume: 94.10% [N/A:N/A] Classification: [3:Grade 1] [N/A:N/A] Exudate Amount: [3:Medium] [N/A:N/A] Wound Margin: [3:Distinct, outline attached] [N/A:N/A] Granulation Amount: [3:None Present (0%)] [N/A:N/A] Necrotic Amount: [3:Large (67-100%)] [N/A:N/A] Exposed Structures: Fat Layer (Subcutaneous N/A N/A Tissue) Exposed: Yes Fascia: No Tendon: No Muscle: No Joint: No Bone: No Epithelialization: None N/A N/A Debridement: Debridement (16109- N/A N/A 11047) Pre-procedure 11:23 N/A N/A Verification/Time Out Taken: Pain Control: Lidocaine 4% Topical N/A N/A Solution Tissue Debrided: Fibrin/Slough, Fat, N/A N/A Exudates, Subcutaneous Level: Skin/Subcutaneous N/A N/A Tissue Debridement Area (sq 0.45 N/A N/A cm): Instrument: Curette N/A N/A Bleeding: Minimum N/A N/A Hemostasis Achieved: Pressure N/A N/A Procedural Pain: 2 N/A N/A Post Procedural  Pain: 2 N/A N/A Debridement Treatment Procedure was tolerated N/A N/A Response: well Post Debridement 1.5x0.3x0.1 N/A N/A Measurements L x W x D (cm) Post Debridement 0.035 N/A N/A Volume: (cm) Periwound Skin Texture: Excoriation: No N/A N/A Induration: No Callus: No Crepitus: No Rash: No Scarring: No Periwound Skin Maceration: Yes N/A N/A Moisture: Dry/Scaly: No Periwound Skin Color: Atrophie Blanche: No N/A N/A Cyanosis: No Ecchymosis: No Erythema: No Hemosiderin Staining: No Mottled: No Pallor: No Rubor: No Temperature: No Abnormality N/A N/A Smolenski, Amil J. ([id number]) Tenderness on No N/A N/A Palpation: Wound Preparation: Ulcer Cleansing: N/A N/A Rinsed/Irrigated with Saline Topical Anesthetic Applied: Other: lidocaine 4% Procedures Performed: Debridement N/A N/A Treatment Notes Wound #3 (Left, Medial Toe Great) 1. Cleansed with: Clean wound with Normal Saline 4. Dressing Applied: Santyl Ointment 5. Secondary Dressing Applied Dry Gauze Kerlix/Conform 6. Footwear/Offloading device applied Other footwear/offloading device applied (specify in notes) 7. 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 Date of Service: 08/16/2016 11:15 AM Medical Record Number: [id number] Patient Account Number: [id number] Date of Birth/Sex: 12-09-1950 (66 y.o. Female) Treating RN: Afful, RN, BSN, [employer] Primary Care Jaylia Pettus: Tonia Ghent Other Clinician: Referring Jayelyn Barno: Tonia Ghent Treating Talibah Colasurdo/Extender: Rudene Re in Treatment: 5 Active Inactive ` Abuse / Safety / Falls / Self Care  Management Nursing Diagnoses: Potential for falls Goals: Patient will remain injury free Date Initiated: 07/12/2016 Target Resolution Date: 10/26/2016 Goal Status: Active Interventions: Assess fall risk on admission and as needed Assess impairment of mobility on admission and as needed per policy Notes: ` Nutrition Nursing Diagnoses: Imbalanced nutrition Impaired glucose control: actual or potential Potential for alteratiion in Nutrition/Potential for imbalanced nutrition Goals: Patient/caregiver agrees to and verbalizes understanding of need to use nutritional supplements and/or vitamins as prescribed Date Initiated: 07/12/2016 Target Resolution Date: 09/28/2016 Goal Status: Active Interventions: Assess patient nutrition upon admission and as needed per policy Provide education on elevated blood sugars and impact on wound healing Notes: CAMILE, ESTERS ([id number]) Orientation to the Wound Care Program Nursing Diagnoses: Knowledge deficit related to the wound healing center program Goals: Patient/caregiver will verbalize understanding of the Wound Healing Center Program Date Initiated: 07/12/2016 Target Resolution Date: 07/27/2016 Goal Status: Active Interventions: Provide education on orientation to the wound center Notes: ` Pain, Acute or Chronic Nursing Diagnoses: Pain, acute or chronic: actual or potential Potential alteration in comfort, pain Goals: Patient/caregiver will verbalize adequate pain control between visits Date Initiated: 07/12/2016 Target Resolution  Date: 10/26/2016 Goal Status: Active Interventions: Assess comfort goal upon admission Complete pain assessment as per visit requirements Notes: ` Soft Tissue Infection Nursing Diagnoses: Impaired tissue integrity Knowledge deficit related to disease process and management Knowledge deficit related to home infection control: handwashing, handling of soiled dressings,  supply storage Goals: Patient/caregiver will verbalize understanding of or measures to prevent infection and contamination in the home setting Date Initiated: 07/12/2016 Target Resolution Date: 09/28/2016 Goal Status: Active FRANCHESCA, VENEZIANO ([id number]) Interventions: Assess signs and symptoms of infection every visit Notes: ` Wound/Skin Impairment Nursing Diagnoses: Impaired tissue integrity Knowledge deficit related to smoking impact on wound healing Knowledge deficit related to ulceration/compromised skin integrity Goals: Ulcer/skin breakdown will have a volume reduction of 80% by week 12 Date Initiated: 07/12/2016 Target Resolution Date: 10/26/2016 Goal Status: Active Interventions: Assess patient/caregiver ability to perform ulcer/skin care regimen upon admission and as needed Assess ulceration(s) every visit Provide education on smoking Notes: Electronic Signature(s) Signed: 08/16/2016 11:47:47 AM By: Elpidio Eric BSN, RN Entered By: Elpidio Eric on 08/16/2016 11:19:55 Leech, [name] ([id number]) -------------------------------------------------------------------------------- Pain Assessment Details Patient Name: [name]. Date of Service: 08/16/2016 11:15 AM Medical Record Number: [id number] Patient Account Number: [id number] Date of Birth/Sex: 24-Mar-1951 (66 y.o. Female) Treating RN: Afful, RN, BSN, [place] Sink Primary Care Valari Taylor: Tonia Ghent Other Clinician: Referring Dewitte Vannice: Tonia Ghent Treating Latavius Capizzi/Extender: Rudene Re in Treatment: 5 Active Problems Location of Pain Severity and Description of Pain Patient Has Paino No Site Locations With Dressing Change: No Pain Management and Medication Current Pain Management: Electronic Signature(s) Signed: 08/16/2016 11:11:18 AM By: Elpidio Eric BSN, RN Entered By: Elpidio Eric on 08/16/2016 11:11:18 Matchett, [name]  ([id number]) -------------------------------------------------------------------------------- Patient/Caregiver Education Details Patient Name: [name]. Date of Service: 08/16/2016 11:15 AM Medical Record Number: [id number] Patient Account Number: [id number] Date of Birth/Gender: [birth year] (66 y.o. Female) Treating RN: Afful, RN, BSN, [place] Sink Primary Care Physician: Tonia Ghent Other Clinician: Referring Physician: Tonia Ghent Treating Physician/Extender: Rudene Re in Treatment: 5 Education Assessment Education Provided To: Patient Education Topics Provided Elevated Blood Sugar/ Impact on Healing: Methods: Explain/Verbal Responses: State content correctly Smoking and Wound Healing: Methods: Explain/Verbal Responses: State content correctly Welcome To The Wound Care Center: Methods: Explain/Verbal Responses: State content correctly Wound Debridement: Methods: Explain/Verbal Responses: State content correctly Wound/Skin Impairment: Methods: Explain/Verbal Responses: State content correctly Electronic Signature(s) Signed: 08/16/2016 11:47:47 AM By: Elpidio Eric BSN, RN Entered By: Elpidio Eric on 08/16/2016 11:34:08 Pudlo, [name] ([id number]) -------------------------------------------------------------------------------- Wound Assessment Details Patient Name: [name]. Date of Service: 08/16/2016 11:15 AM Medical Record Number: [id number] Patient Account Number: [id number] Date of Birth/Sex: 01-14-1951 (66 y.o. Female) Treating RN: Afful, RN, BSN, Psychologist, clinical Primary Care Cinde Ebert: Tonia Ghent Other Clinician: Referring Yovan Leeman: Tonia Ghent Treating Caiden Monsivais/Extender: Rudene Re in Treatment: 5 Wound Status Wound Number: 3 Primary Diabetic Wound/Ulcer of the Lower Etiology: Extremity Wound Location: Left Toe Great - Medial Secondary Infection - not elsewhere classified Wounding Event: Gradually Appeared Etiology: Date Acquired: 06/14/2016 Wound  Open Weeks Of Treatment: 5 Status: Clustered Wound: No Comorbid Asthma, Chronic Obstructive Pending Amputation On Presentation History: Pulmonary Disease (COPD), Congestive Heart Failure, Hypertension, Type II Diabetes, Osteoarthritis, Neuropathy Photos Photo Uploaded By: Elpidio Eric on 08/16/2016 15:56:02 Wound Measurements Length: (cm) 1.5 Width: (cm) 0.3 Depth: (cm) 0.1 Area: (cm) 0.353 Volume: (cm) 0.035 % Reduction in Area: 94% % Reduction in Volume: 94.1% Epithelialization: None Tunneling: No Undermining: No Wound Description Classification: Grade 1 Foul Odor Aft Wound Margin: Distinct, outline  attached Slough/Fibrin Exudate Amount: Medium er Cleansing: No o Yes Wound Bed Granulation Amount: None Present (0%) Exposed Structure Necrotic Amount: Large (67-100%) Fascia Exposed: No Harriss, Ichelle J. ([id number]) Necrotic Quality: Adherent Slough Fat Layer (Subcutaneous Tissue) Exposed: Yes Tendon Exposed: No Muscle Exposed: No Joint Exposed: No Bone Exposed: No Periwound Skin Texture Texture Color No Abnormalities Noted: No No Abnormalities Noted: No Callus: No Atrophie Blanche: No Crepitus: No Cyanosis: No Excoriation: No Ecchymosis: No Induration: No Erythema: No Rash: No Hemosiderin Staining: No Scarring: No Mottled: No Pallor: No Moisture Rubor: No No Abnormalities Noted: No Dry / Scaly: No Temperature / Pain Maceration: Yes Temperature: No Abnormality Wound Preparation Ulcer Cleansing: Rinsed/Irrigated with Saline Topical Anesthetic Applied: Other: lidocaine 4%, Treatment Notes Wound #3 (Left, Medial Toe Great) 1. Cleansed with: Clean wound with Normal Saline 4. Dressing Applied: Santyl Ointment 5. Secondary Dressing Applied Dry Gauze Kerlix/Conform 6. Footwear/Offloading device applied Other footwear/offloading device applied (specify in notes) 7. 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 Date of Service: 08/16/2016 11:15 AM Medical Record Number: [id number] Patient Account Number: [id number] Date of Birth/Sex: 12/27/1950 (66 y.o. Female) Treating RN: Afful, RN, BSN, [place] Sink Primary Care Vonnie Ligman: Tonia Ghent Other Clinician: Referring Maygan Koeller: Tonia Ghent Treating Jaishawn Witzke/Extender: Rudene Re in Treatment: 5 Vital Signs Time Taken: 11:17 Temperature (F): 98.2 Height (in): 63 Pulse (bpm): 89 Weight (lbs): 224 Respiratory Rate (breaths/min): 18 Body Mass Index (BMI): 39.7 Blood Pressure (mmHg): 125/65 Reference Range: 80 - 120 mg / dl Electronic Signature(s) Signed: 08/16/2016 11:47:47 AM By: Elpidio Eric BSN, RN Entered By: Elpidio Eric on 08/16/2016 11:18:51

## 2016-08-18 NOTE — Progress Notes (Signed)
[name], [name] ([id number]) Visit Report for 08/16/2016 Chief Complaint Document Details Patient Name: [name], [name] Date of Service: 08/16/2016 11:15 AM Medical Record Number: [id number] Patient Account Number: [id number] Date of Birth/Sex: 1951/01/01 (66 y.o. Female) Treating RN: Afful, RN, BSN, [place] Primary Care Provider: Tonia GhentBENDER, ABBY Other Clinician: Referring Provider: Tonia GhentBENDER, ABBY Treating Provider/Extender: Rudene ReBritto, Shavonne Ambroise Weeks in Treatment: 5 Information Obtained from: Patient Chief Complaint [name] presents today for evaluation of her left medial hallux and left lateral lower extremity ulcers Electronic Signature(s) Signed: 08/16/2016 11:55:26 AM By: Evlyn KannerBritto, Caelin Rayl MD, FACS Entered By: Evlyn KannerBritto, Aser Nylund on 08/16/2016 11:55:26 [name], [name] ([id number]) -------------------------------------------------------------------------------- Debridement Details Patient Name: [name], [name] Date of Service: 08/16/2016 11:15 AM Medical Record Number: [id number] Patient Account Number: [id number] Date of Birth/Sex: 1951/01/01 (66 y.o. Female) Treating RN: Afful, RN, BSN, [place] Primary Care Provider: Tonia GhentBENDER, ABBY Other Clinician: Referring Provider: Tonia GhentBENDER, ABBY Treating Provider/Extender: Rudene ReBritto, Dann Ventress Weeks in Treatment: 5 Debridement Performed for Wound #3 Left,Medial Toe Great Assessment: Performed By: Physician Evlyn KannerBritto, Bryton Romagnoli, MD Debridement: Debridement Severity of Tissue Pre Fat layer exposed Debridement: Pre-procedure Verification/Time Out Yes - 11:23 Taken: Start Time: 11:23 Pain Control: Lidocaine 4% Topical Solution Level: Skin/Subcutaneous Tissue Total Area Debrided (L x 1.5 (cm) x 0.3 (cm) = 0.45 (cm) W): Tissue and other Non-Viable, Exudate, Fat, Fibrin/Slough, Subcutaneous material debrided: Instrument: Curette Bleeding: Minimum Hemostasis Achieved: Pressure End Time: 11:27 Procedural Pain: 2 Post Procedural Pain: 2 Response to Treatment: Procedure was  tolerated well Post Debridement Measurements of Total Wound Length: (cm) 1.5 Width: (cm) 0.3 Depth: (cm) 0.1 Volume: (cm) 0.035 Character of Wound/Ulcer Post Requires Further Debridement Debridement: Severity of Tissue Post Debridement: Fat layer exposed Post Procedure Diagnosis Same as Pre-procedure Electronic Signature(s) Signed: 08/16/2016 11:55:19 AM By: Evlyn KannerBritto, Cyanna Neace MD, FACS Signed: 08/16/2016 3:57:04 PM By: Elpidio EricAfful, Rita BSN, RN [name], [name] ([id number]) Previous Signature: 08/16/2016 11:55:06 AM Version By: Evlyn KannerBritto, Mattilyn Crites MD, FACS Previous Signature: 08/16/2016 11:47:47 AM Version By: Elpidio EricAfful, Rita BSN, RN Entered By: Evlyn KannerBritto, Kaylie Ritter on 08/16/2016 11:55:19 [name], [name] ([id number]) -------------------------------------------------------------------------------- HPI Details Patient Name: [name], [name] Date of Service: 08/16/2016 11:15 AM Medical Record Number: [id number] Patient Account Number: [id number] Date of Birth/Sex: 1951/01/01 (66 y.o. Female) Treating RN: Afful, RN, BSN, Psychologist, clinicalita Primary Care Provider: Tonia GhentBENDER, ABBY Other Clinician: Referring Provider: Tonia GhentBENDER, ABBY Treating Provider/Extender: Rudene ReBritto, Hridaan Bouse Weeks in Treatment: 5 History of Present Illness Location: left medial hallux; LLE lateral Duration: left medial hallux-4 weeks; LLE lateral-2-1/2 weeks Timing: left medial hallux-pain with pressure/standing, ambulating, manipulation; LLE lateral-pain with manipulation Context: left medial hallux-originally was a crack in the skin; LLE lateral-traumatic injury with car door Modifying Factors: uncontrolled diabetes and smoking are exacerbating factors to wound healing HPI Description: 07/12/16- the patient arrives for initial evaluation of a left hallux and left lower extremity lateral ulcer. She states that the left hallux originally had a crack in the skin which she began to apply AandD ointment. She went to her PCP at [facility] on 4/[facility], while going  into her appointment she struck her left lateral leg on the car door. At that appointment she was given a prescription for Bactrim and completed that on 4/14 with no adverse reaction. She states that it made little to no improvement in her toe wound. She went back for a follow-up visit on 4/18 and was prescribed doxycycline for 10 days. She continues to apply AandD topical ointment along with soaking her foot in warm water. She is a diabetic, most recent  A1c of 9% (I do not have records of this, this is per patient report). She is both on oral agents and insulin. She is a current smoker, approximately 0.5 ppd. She denies any remote or recent evaluation regarding arterial or venous disease. 07/19/16 Patient presents today for follow-up evaluation concerning her left great toe wound immediately and left lateral lower extremity wound. Unfortunately though both wounds appear better visually she is having a lot of discomfort. I'm unsure if this is a preformed drop of the issue or potentially the ideas or appointment is causing irritation which has caused her discomfort and pain. The good news is her wound culture came back negative and showed only normal skin flora and her x-ray of the left foot was also negative for any osseous change or osteomyelitis. Unfortunately she continues to have a lot of discomfort she would rate it a seven out of 10 right now. 07/26/2016 -- she is doing much better since the Iodoflex was stopped and she is on Santyl. Her culture report was negative and only had normal skin flora. She continues to smoke about a half pack of cigarettes a day. 08/05/2016 -- she is doing much better with her smoking and is down to about 5 cigarettes a day. The left lower extremity wound is healed and she continues to have a lot of tenderness on her left big toe. In the course of conversation we realize she was using alcohol to clean her left big toe and we have told her to stop this Electronic  Signature(s) Signed: 08/16/2016 11:55:30 AM By: Evlyn Kanner MD, FACS Entered By: Evlyn Kanner on 08/16/2016 11:55:30 [name], [name] [place], Maddi Shela Commons ([id number]) -------------------------------------------------------------------------------- Physical Exam Details Patient Name: [name], SHOW. Date of Service: 08/16/2016 11:15 AM Medical Record Number: [id number] Patient Account Number: [id number] Date of Birth/Sex: June 07, 1950 (66 y.o. Female) Treating RN: Afful, RN, BSN, [employer] Primary Care Provider: Tonia Ghent Other Clinician: Referring Provider: Tonia Ghent Treating Provider/Extender: Rudene Re in Treatment: 5 Constitutional . Pulse regular. Respirations normal and unlabored. Afebrile. . Eyes Nonicteric. Reactive to light. Ears, Nose, Mouth, and Throat Lips, teeth, and gums WNL.Marland Kitchen Moist mucosa without lesions. Neck supple and nontender. No palpable supraclavicular or cervical adenopathy. Normal sized without goiter. Respiratory WNL. No retractions.. Cardiovascular Pedal Pulses WNL. No clubbing, cyanosis or edema. Lymphatic No adneopathy. No adenopathy. No adenopathy. Musculoskeletal Adexa without tenderness or enlargement.. Digits and nails w/o clubbing, cyanosis, infection, petechiae, ischemia, or inflammatory conditions.. Integumentary (Hair, Skin) No suspicious lesions. No crepitus or fluctuance. No peri-wound warmth or erythema. No masses.Marland Kitchen Psychiatric Judgement and insight Intact.. No evidence of depression, anxiety, or agitation.. Notes the left medial big toe wound has significantly got eschar and subcutaneous debris and I have sharply remove this with a #3 curet and bleeding control with some pressure. Electronic Signature(s) Signed: 08/16/2016 11:56:20 AM By: Evlyn Kanner MD, FACS Entered By: Evlyn Kanner on 08/16/2016 11:56:20 Hibbitts, [name]  ([id number]) -------------------------------------------------------------------------------- Physician Orders Details Patient Name: [name], UYENO. Date of Service: 08/16/2016 11:15 AM Medical Record Number: [id number] Patient Account Number: [id number] Date of Birth/Sex: 02/07/1951 (66 y.o. Female) Treating RN: Afful, RN, BSN, [place] Sink Primary Care Provider: Tonia Ghent Other Clinician: Referring Provider: Tonia Ghent Treating Provider/Extender: Rudene Re in Treatment: 5 Verbal / Phone Orders: No Diagnosis Coding Wound Cleansing Wound #3 Left,Medial Toe Great o Clean wound with Normal Saline. o Cleanse wound with mild soap and water Anesthetic Wound #3 Left,Medial Toe Great o Topical Lidocaine 4% cream  applied to wound bed prior to debridement Primary Wound Dressing Wound #3 Left,Medial Toe Great o Santyl Ointment Secondary Dressing Wound #3 Left,Medial Toe Great o Dry Gauze o Gauze and Kerlix/Conform o Other - tape Dressing Change Frequency Wound #3 Left,Medial Toe Great o Change dressing every day. o Other: - PRN Follow-up Appointments Wound #3 Left,Medial Toe Great o Return Appointment in 1 week. Edema Control Wound #3 Left,Medial Toe Great o Elevate legs to the level of the heart and pump ankles as often as possible Additional Orders / Instructions Wound #3 Left,Medial Toe Great o Stop Smoking Christiana, Mccartney J. ([id number]) o Increase protein intake. Medications-please add to medication list. Wound #3 Left,Medial Toe Great o Santyl Enzymatic Ointment Electronic Signature(s) Signed: 08/16/2016 11:47:47 AM By: Elpidio Eric BSN, RN Signed: 08/16/2016 2:37:26 PM By: Evlyn Kanner MD, FACS Entered By: Elpidio Eric on 08/16/2016 11:27:11 Chalmers, [name] ([id number]) -------------------------------------------------------------------------------- Problem List Details Patient Name: [name], [name]. Date of Service: 08/16/2016 11:15  AM Medical Record Number: [id number] Patient Account Number: [id number] Date of Birth/Sex: May 23, 1950 (66 y.o. 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 ([id number]) -------------------------------------------------------------------------------- Progress Note Details Patient Name: Kathleen Pigg. Date of Service: 08/16/2016 11:15 AM Medical Record Number: [id number] Patient Account Number: [id number] Date of Birth/Sex: [birth year] (66 y.o. Female) Treating RN: Afful, RN, BSN, Psychologist, clinical Primary Care Provider: Tonia Ghent Other Clinician: Referring Provider: Tonia Ghent Treating Provider/Extender: Rudene Re in Treatment: 5 Subjective Chief Complaint Information obtained from Patient Mrs. Hevia presents today for evaluation of her left medial hallux and left lateral lower extremity ulcers History of Present Illness (HPI) The following HPI elements were documented for the patient's wound: Location: left medial hallux; LLE lateral Duration: left medial hallux-4 weeks; LLE lateral-2-1/2 weeks Timing: left medial hallux-pain with pressure/standing, ambulating,  manipulation; LLE lateral-pain with manipulation Context: left medial hallux-originally was a crack in the skin; LLE lateral-traumatic injury with car door Modifying Factors: uncontrolled diabetes and smoking are exacerbating factors to wound healing 07/12/16- the patient arrives for initial evaluation of a left hallux and left lower extremity lateral ulcer. She states that the left hallux originally had a crack in the skin which she began to apply AandD ointment. She went to her PCP at [facility] on 4/4, while going into her appointment she struck her left lateral leg on the car door. At that appointment she was given a prescription for Bactrim and completed that on 4/14 with no adverse reaction. She states that it made little to no improvement in her toe wound. She went back for a follow-up visit on 4/18 and was prescribed doxycycline for 10 days. She continues to apply AandD topical ointment along with soaking her foot in warm water. She is a diabetic, most recent A1c of 9% (I do not have records of this, this  is per patient report). She is both on oral agents and insulin. She is a current smoker, approximately 0.5 ppd. She denies any remote or recent evaluation regarding arterial or venous disease. 07/19/16 Patient presents today for follow-up evaluation concerning her left great toe wound immediately and left lateral lower extremity wound. Unfortunately though both wounds appear better visually she is having a lot of discomfort. I'm unsure if this is a preformed drop of the issue or potentially the ideas or appointment is causing irritation which has caused her discomfort and pain. The good news is her wound culture came back negative and showed only normal skin flora and her x-ray of the left foot was also negative for any osseous change or osteomyelitis. Unfortunately she continues to have a lot of discomfort she would rate it a seven out of 10 right now. 07/26/2016 --  she is doing much better since the Iodoflex was stopped and she is on Santyl. Her culture report was negative and only had normal skin flora. She continues to smoke about a half pack of cigarettes a day. 08/05/2016 -- she is doing much better with her smoking and is down to about 5 cigarettes a day. The left lower extremity wound is healed and she continues to have a lot of tenderness on her left big toe. In the POPPI, SCANTLING ([id number]) course of conversation we realize she was using alcohol to clean her left big toe and we have told her to stop this Objective Constitutional Pulse regular. Respirations normal and unlabored. Afebrile. Vitals Time Taken: 11:17 AM, Height: 63 in, Weight: 224 lbs, BMI: 39.7, Temperature: 98.2 F, Pulse: 89 bpm, Respiratory Rate: 18 breaths/min, Blood Pressure: 125/65 mmHg. Eyes Nonicteric. Reactive to light. Ears, Nose, Mouth, and Throat Lips, teeth, and gums WNL.Marland Kitchen Moist mucosa without lesions. Neck supple and nontender. No palpable supraclavicular or cervical adenopathy. Normal sized without goiter. Respiratory WNL. No retractions.. Cardiovascular Pedal Pulses WNL. No clubbing, cyanosis or edema. Lymphatic No adneopathy. No adenopathy. No adenopathy. Musculoskeletal Adexa without tenderness or enlargement.. Digits and nails w/o clubbing, cyanosis, infection, petechiae, ischemia, or inflammatory conditions.Marland Kitchen Psychiatric Judgement and insight Intact.. No evidence of depression, anxiety, or agitation.. General Notes: the left medial big toe wound has significantly got eschar and subcutaneous debris and I have sharply remove this with a #3 curet and bleeding control with some pressure. Integumentary (Hair, Skin) No suspicious lesions. No crepitus or fluctuance. No peri-wound warmth or erythema. No masses.. Wound #3 status is Open. Original cause of wound was Gradually Appeared. The wound is located on the Buchanan, ELIANA LUETH. ([id number]) Left,Medial Toe  Great. The wound measures 1.5cm length x 0.3cm width x 0.1cm depth; 0.353cm^2 area and 0.035cm^3 volume. There is Fat Layer (Subcutaneous Tissue) Exposed exposed. There is no tunneling or undermining noted. There is a medium amount of drainage noted. The wound margin is distinct with the outline attached to the wound base. There is no granulation within the wound bed. There is a large (67- 100%) amount of necrotic tissue within the wound bed including Adherent Slough. The periwound skin appearance exhibited: Maceration. The periwound skin appearance did not exhibit: Callus, Crepitus, Excoriation, Induration, Rash, Scarring, Dry/Scaly, Atrophie Blanche, Cyanosis, Ecchymosis, Hemosiderin Staining, Mottled, Pallor, Rubor, Erythema. Periwound temperature was noted as No Abnormality. 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 There was a Skin/Subcutaneous Tissue Debridement ([id number]) debridement with total area of 0.45 sq cm performed by Evlyn Kanner, MD. with the following instrument(s): Curette to remove Non-Viable tissue/material including Exudate, Fat Layer (and Subcutaneous Tissue) Exposed, Fibrin/Slough, and Subcutaneous after achieving pain control using Lidocaine 4% Topical Solution. A time out was conducted at 11:23, prior to the start of the procedure. A Minimum amount of bleeding was controlled with Pressure. The procedure was tolerated well  with a pain level of 2 throughout and a pain level of 2 following the procedure. Post Debridement Measurements: 1.5cm length x 0.3cm width x 0.1cm depth; 0.035cm^3 volume. Character of Wound/Ulcer Post Debridement requires further debridement. Severity of Tissue Post Debridement is: Fat layer exposed. Post procedure Diagnosis Wound #3: Same as Pre-Procedure Vadala, Lilianne J. ([id number]) Plan Wound Cleansing: Wound #3 Left,Medial Toe Great: Clean wound with Normal Saline. Cleanse wound with mild soap and water Anesthetic: Wound #3 Left,Medial Toe Great: Topical Lidocaine 4% cream applied to wound bed prior to debridement Primary Wound Dressing: Wound #3 Left,Medial Toe Great: Santyl Ointment Secondary Dressing: Wound #3 Left,Medial Toe Great: Dry Gauze Gauze and Kerlix/Conform Other - tape Dressing Change Frequency: Wound #3 Left,Medial Toe Great: Change dressing every day. Other: - PRN Follow-up Appointments: Wound #3 Left,Medial Toe Great: Return Appointment in 1 week. Edema Control: Wound #3 Left,Medial Toe Great: Elevate legs to the level of the heart and pump ankles as often as possible Additional Orders / Instructions: Wound #3 Left,Medial Toe Great: Stop Smoking Increase protein intake. Medications-please add to medication list.: Wound #3 Left,Medial Toe Great: Santyl Enzymatic Ointment she has not been here to see [place] for about 10 days and after a review and sharp debridement of her wound, I have also recommended: 1. Santyl ointment to be continued to the left big toe daily. 2. continue to work on stopping cigarette smoking 3. Adequate protein, vitamin A, vitamin C and zinc 4. regular visits the wound center -- I have reiterated the importance of regular visits ELEORA, SUTHERLAND ([id number]) Electronic Signature(s) Signed: 08/16/2016 11:57:20 AM By: Evlyn Kanner MD, FACS Entered By: Evlyn Kanner on 08/16/2016 11:57:20 Gfeller, [name]  ([id number]) -------------------------------------------------------------------------------- SuperBill Details Patient Name: Kathleen Pigg. Date of Service: 08/16/2016 Medical Record Number: [id number] Patient Account Number: [id number] Date of Birth/Sex: 08/21/1950 (66 y.o. Female) Treating RN: Afful, RN, BSN, [place] Sink Primary Care Provider: Tonia Ghent Other Clinician: Referring Provider: Tonia Ghent Treating Provider/Extender: Rudene Re in Treatment: 5 Diagnosis Coding ICD-10 Codes Code Description 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 2 diabetes mellitus with foot ulcer E11.622 Type 2 diabetes mellitus with other skin ulcer L97.511 Non-pressure chronic ulcer of other part of right foot limited to breakdown of skin L97.212 Non-pressure chronic ulcer of right calf with fat layer exposed F17.218 Nicotine dependence, cigarettes, with other nicotine-induced disorders Facility Procedures CPT4: Description Modifier Quantity Code 44010272 11042 - DEB SUBQ TISSUE 20 SQ CM/< 1 ICD-10 Description Diagnosis 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 2 diabetes mellitus with foot ulcer L97.511 Non-pressure chronic ulcer of other part of right foot limited to breakdown of  skin L97.212 Non-pressure chronic ulcer of right calf with fat layer exposed Physician Procedures CPT4: Description Modifier Quantity Code [id number] 11042 - WC PHYS  SUBQ TISS 20 SQ CM 1 ICD-10 Description Diagnosis 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 2 diabetes mellitus with foot ulcer L97.511 Non-pressure chronic ulcer of other part of right foot limited to breakdown of  skin L97.212 Non-pressure chronic ulcer of right calf with fat layer exposed Electronic Signature(s) Signed: 08/16/2016 11:57:31 AM By: Evlyn Kanner MD, FACS Entered By: Evlyn Kanner on 08/16/2016 11:57:31

## 2016-08-23 ENCOUNTER — Encounter: Payer: Medicare HMO | Attending: Surgery | Admitting: Surgery

## 2016-08-23 DIAGNOSIS — L97212 Non-pressure chronic ulcer of right calf with fat layer exposed: Secondary | ICD-10-CM | POA: Insufficient documentation

## 2016-08-23 DIAGNOSIS — L97511 Non-pressure chronic ulcer of other part of right foot limited to breakdown of skin: Secondary | ICD-10-CM | POA: Insufficient documentation

## 2016-08-23 DIAGNOSIS — J449 Chronic obstructive pulmonary disease, unspecified: Secondary | ICD-10-CM | POA: Diagnosis not present

## 2016-08-23 DIAGNOSIS — I509 Heart failure, unspecified: Secondary | ICD-10-CM | POA: Insufficient documentation

## 2016-08-23 DIAGNOSIS — F17218 Nicotine dependence, cigarettes, with other nicotine-induced disorders: Secondary | ICD-10-CM | POA: Insufficient documentation

## 2016-08-23 DIAGNOSIS — F329 Major depressive disorder, single episode, unspecified: Secondary | ICD-10-CM | POA: Diagnosis not present

## 2016-08-23 DIAGNOSIS — Z794 Long term (current) use of insulin: Secondary | ICD-10-CM | POA: Diagnosis not present

## 2016-08-23 DIAGNOSIS — M1990 Unspecified osteoarthritis, unspecified site: Secondary | ICD-10-CM | POA: Insufficient documentation

## 2016-08-23 DIAGNOSIS — E11622 Type 2 diabetes mellitus with other skin ulcer: Secondary | ICD-10-CM | POA: Diagnosis not present

## 2016-08-23 DIAGNOSIS — Z885 Allergy status to narcotic agent status: Secondary | ICD-10-CM | POA: Insufficient documentation

## 2016-08-23 DIAGNOSIS — Z8673 Personal history of transient ischemic attack (TIA), and cerebral infarction without residual deficits: Secondary | ICD-10-CM | POA: Diagnosis not present

## 2016-08-23 DIAGNOSIS — I110 Hypertensive heart disease with heart failure: Secondary | ICD-10-CM | POA: Insufficient documentation

## 2016-08-23 DIAGNOSIS — E11621 Type 2 diabetes mellitus with foot ulcer: Secondary | ICD-10-CM | POA: Insufficient documentation

## 2016-08-23 DIAGNOSIS — Z880 Allergy status to penicillin: Secondary | ICD-10-CM | POA: Insufficient documentation

## 2016-08-25 NOTE — Progress Notes (Signed)
[name], [name] ([id number]) Visit Report for 08/23/2016 Chief Complaint Document Details Patient Name: [name], [name]. Date of Service: 08/23/2016 3:30 PM Medical Record Number: [id number] Patient Account Number: [id number] Date of Birth/Sex: 1951-03-10 (66 y.o. Female) Treating RN: Afful, RN, BSN, [place] Sink Primary Care Provider: Tonia Ghent Other Clinician: Referring Provider: Tonia Ghent Treating Provider/Extender: Rudene Re in Treatment: 6 Information Obtained from: Patient Chief Complaint [name] presents today for evaluation of her left medial hallux and left lateral lower extremity ulcers Electronic Signature(s) Signed: 08/23/2016 4:09:01 PM By: Evlyn Kanner MD, FACS Entered By: Evlyn Kanner on 08/23/2016 16:09:01 [name] ([id number]) -------------------------------------------------------------------------------- Debridement Details Patient Name: [name]. Date of Service: 08/23/2016 3:30 PM Medical Record Number: [id number] Patient Account Number: [id number] Date of Birth/Sex: [birth year] (66 y.o. Female) Treating RN: Afful, RN, BSN, [place] Sink Primary Care Provider: Tonia Ghent Other Clinician: Referring Provider: Tonia Ghent Treating Provider/Extender: Rudene Re in Treatment: 6 Debridement Performed for Wound #3 Left,Medial Toe Great Assessment: Performed By: Physician Evlyn Kanner, MD Debridement: Debridement Severity of Tissue Pre Fat layer exposed Debridement: Pre-procedure Verification/Time Out Yes - 15:52 Taken: Start Time: 15:52 Pain Control: Lidocaine 4% Topical Solution Level: Skin/Subcutaneous Tissue Total Area Debrided (L x 1.5 (cm) x 0.3 (cm) = 0.45 (cm) W): Tissue and other Non-Viable, Exudate, Fat, Fibrin/Slough, Subcutaneous material debrided: Instrument: Curette Bleeding: Minimum Hemostasis Achieved: Pressure End Time: 15:55 Procedural Pain: 3 Post Procedural Pain: 3 Response to Treatment: Procedure was  tolerated well Post Debridement Measurements of Total Wound Length: (cm) 1.5 Width: (cm) 0.3 Depth: (cm) 0.2 Volume: (cm) 0.071 Character of Wound/Ulcer Post Requires Further Debridement Debridement: Severity of Tissue Post Debridement: Fat layer exposed Post Procedure Diagnosis Same as Pre-procedure Electronic Signature(s) Signed: 08/23/2016 4:08:50 PM By: Evlyn Kanner MD, FACS Signed: 08/23/2016 4:25:17 PM By: Elpidio Eric BSN, RN Hodgens, Deyra J. ([id number]) Entered By: Evlyn Kanner on 08/23/2016 16:08:49 Kocak, Lakeithia J. ([id number]) -------------------------------------------------------------------------------- HPI Details Patient Name: [name], [name]. Date of Service: 08/23/2016 3:30 PM Medical Record Number: [id number] Patient Account Number: [id number] Date of Birth/Sex: 1950-12-18 (66 y.o. Female) Treating RN: Afful, RN, BSN, Psychologist, clinical Primary Care Provider: Tonia Ghent Other Clinician: Referring Provider: Tonia Ghent Treating Provider/Extender: Rudene Re in Treatment: 6 History of Present Illness Location: left medial hallux; LLE lateral Duration: left medial hallux-4 weeks; LLE lateral-2-1/2 weeks Timing: left medial hallux-pain with pressure/standing, ambulating, manipulation; LLE lateral-pain with manipulation Context: left medial hallux-originally was a crack in the skin; LLE lateral-traumatic injury with car door Modifying Factors: uncontrolled diabetes and smoking are exacerbating factors to wound healing HPI Description: 07/12/16- the patient arrives for initial evaluation of a left hallux and left lower extremity lateral ulcer. She states that the left hallux originally had a crack in the skin which she began to apply AandD ointment. She went to her PCP at [facility] on 4/4, while going into her appointment she struck her left lateral leg on the car door. At that appointment she was given a prescription for Bactrim and completed that on  4/14 with no adverse reaction. She states that it made little to no improvement in her toe wound. She went back for a follow-up visit on 4/18 and was prescribed doxycycline for 10 days. She continues to apply AandD topical ointment along with soaking her foot in warm water. She is a diabetic, most recent A1c of 9% (I do not have records of this, this is per patient report). She is both on oral agents and  insulin. She is a current smoker, approximately 0.5 ppd. She denies any remote or recent evaluation regarding arterial or venous disease. 07/19/16 Patient presents today for follow-up evaluation concerning her left great toe wound immediately and left lateral lower extremity wound. Unfortunately though both wounds appear better visually she is having a lot of discomfort. I'm unsure if this is a preformed drop of the issue or potentially the ideas or appointment is causing irritation which has caused her discomfort and pain. The good news is her wound culture came back negative and showed only normal skin flora and her x-ray of the left foot was also negative for any osseous change or osteomyelitis. Unfortunately she continues to have a lot of discomfort she would rate it a seven out of 10 right now. 07/26/2016 -- she is doing much better since the Iodoflex was stopped and she is on Santyl. Her culture report was negative and only had normal skin flora. She continues to smoke about a half pack of cigarettes a day. 08/05/2016 -- she is doing much better with her smoking and is down to about 5 cigarettes a day. The left lower extremity wound is healed and she continues to have a lot of tenderness on her left big toe. In the course of conversation we realize she was using alcohol to clean her left big toe and we have told her to stop this Electronic Signature(s) Signed: 08/23/2016 4:09:06 PM By: Evlyn Kanner MD, FACS Entered By: Evlyn Kanner on 08/23/2016 16:09:06 [name]  ([id number]) [name] ([id number]) -------------------------------------------------------------------------------- Physical Exam Details Patient Name: [name], [name]. Date of Service: 08/23/2016 3:30 PM Medical Record Number: [id number] Patient Account Number: [id number] Date of Birth/Sex: 07-01-1950 (66 y.o. Female) Treating RN: Afful, RN, BSN, [place] Sink Primary Care Provider: Tonia Ghent Other Clinician: Referring Provider: Tonia Ghent Treating Provider/Extender: Rudene Re in Treatment: 6 Constitutional . Pulse regular. Respirations normal and unlabored. Afebrile. . Eyes Nonicteric. Reactive to light. Ears, Nose, Mouth, and Throat Lips, teeth, and gums WNL.Marland Kitchen Moist mucosa without lesions. Neck supple and nontender. No palpable supraclavicular or cervical adenopathy. Normal sized without goiter. Respiratory WNL. No retractions.. Cardiovascular Pedal Pulses WNL. No clubbing, cyanosis or edema. Lymphatic No adneopathy. No adenopathy. No adenopathy. Musculoskeletal Adexa without tenderness or enlargement.. Digits and nails w/o clubbing, cyanosis, infection, petechiae, ischemia, or inflammatory conditions.. Integumentary (Hair, Skin) No suspicious lesions. No crepitus or fluctuance. No peri-wound warmth or erythema. No masses.Marland Kitchen Psychiatric Judgement and insight Intact.. No evidence of depression, anxiety, or agitation.. Notes sharp debridement was done with a #3 curet and tenderness was significant. No active bleeding. Electronic Signature(s) Signed: 08/23/2016 4:09:45 PM By: Evlyn Kanner MD, FACS Entered By: Evlyn Kanner on 08/23/2016 16:09:44 Kemmerling, Mardene [name] ([id number]) -------------------------------------------------------------------------------- Physician Orders Details Patient Name: [name], [name]. Date of Service: 08/23/2016 3:30 PM Medical Record Number: [id number] Patient Account Number: [id number] Date of Birth/Sex: 07/01/1950 (66 y.o. Female) Treating RN:  Afful, RN, BSN, [place] Sink Primary Care Provider: Tonia Ghent Other Clinician: Referring Provider: Tonia Ghent Treating Provider/Extender: Rudene Re in Treatment: 6 Verbal / Phone Orders: No Diagnosis Coding Wound Cleansing Wound #3 Left,Medial Toe Great o Clean wound with Normal Saline. o Cleanse wound with mild soap and water Anesthetic Wound #3 Left,Medial Toe Great o Topical Lidocaine 4% cream applied to wound bed prior to debridement Primary Wound Dressing Wound #3 Left,Medial Toe Great o Santyl Ointment Secondary Dressing Wound #3 Left,Medial Toe Great o Dry Gauze o Gauze and Kerlix/Conform o Other - tape  Dressing Change Frequency Wound #3 Left,Medial Toe Great o Change dressing every day. o Other: - PRN Follow-up Appointments Wound #3 Left,Medial Toe Great o Return Appointment in 1 week. Edema Control Wound #3 Left,Medial Toe Great o Elevate legs to the level of the heart and pump ankles as often as possible Additional Orders / Instructions Wound #3 Left,Medial Toe Great o Stop Smoking Rotenberry, Alyzah J. ([id number]) o Increase protein intake. Medications-please add to medication list. Wound #3 Left,Medial Toe Great o Santyl Enzymatic Ointment Electronic Signature(s) Signed: 08/23/2016 4:12:51 PM By: Evlyn Kanner MD, FACS Signed: 08/23/2016 4:25:17 PM By: Elpidio Eric BSN, RN Entered By: Elpidio Eric on 08/23/2016 15:54:18 Seal, Mardene [name] ([id number]) -------------------------------------------------------------------------------- Problem List Details Patient Name: [name], [name]. Date of Service: 08/23/2016 3:30 PM Medical Record Number: [id number] Patient Account Number: [id number] Date of Birth/Sex: February 20, 1951 (66 y.o. 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 ([id number]) -------------------------------------------------------------------------------- Progress Note Details Patient Name: [name]. Date of Service: 08/23/2016 3:30 PM Medical Record Number: [id number] Patient Account Number: [id number] Date of Birth/Sex: 1950-12-01 (66 y.o. Female) Treating RN: Afful, RN, BSN, Psychologist, clinical Primary Care Provider: Tonia Ghent Other Clinician: Referring Provider: Tonia Ghent Treating Provider/Extender: Rudene Re in Treatment: 6 Subjective Chief Complaint Information obtained from Patient Mrs. Licciardi presents today for evaluation of her left medial hallux and left lateral lower extremity ulcers History of Present Illness (HPI) The following HPI elements were documented for the patient's wound: Location: left medial hallux; LLE lateral Duration: left medial hallux-4 weeks; LLE lateral-2-1/2 weeks Timing: left medial hallux-pain with pressure/standing, ambulating, manipulation; LLE lateral-pain with manipulation Context: left medial hallux-originally was a crack in the skin; LLE lateral-traumatic injury with car door Modifying Factors: uncontrolled diabetes and smoking are exacerbating factors to wound healing 07/12/16- the patient arrives for initial evaluation of a left hallux and  left lower extremity lateral ulcer. She states that the left hallux originally had a crack in the skin which she began to apply AandD ointment. She went to her PCP at [facility] on 4/4, while going into her appointment she struck her left lateral leg on the car door. At that appointment she was given a prescription for Bactrim and completed that on 4/14 with no adverse reaction. She states that it made little to no improvement in her toe wound. She went back for a follow-up visit on 4/18 and was prescribed doxycycline for 10 days. She continues to apply AandD topical ointment along with soaking her foot in warm water. She is a diabetic, most recent A1c of 9% (I do not have records of this, this is per patient report). She is both on oral agents and insulin. She is a current smoker, approximately 0.5 ppd. She denies any remote or recent evaluation regarding arterial or venous disease. 07/19/16 Patient presents today  for follow-up evaluation concerning her left great toe wound immediately and left lateral lower extremity wound. Unfortunately though both wounds appear better visually she is having a lot of discomfort. I'm unsure if this is a preformed drop of the issue or potentially the ideas or appointment is causing irritation which has caused her discomfort and pain. The good news is her wound culture came back negative and showed only normal skin flora and her x-ray of the left foot was also negative for any osseous change or osteomyelitis. Unfortunately she continues to have a lot of discomfort she would rate it a seven out of 10 right now. 07/26/2016 -- she is doing much better since the Iodoflex was stopped and she is on Santyl. Her culture report was negative and only had normal skin flora. She continues to smoke about a half pack of cigarettes a day. 08/05/2016 -- she is doing much better with her smoking and is down to about 5 cigarettes a day. The left lower extremity  wound is healed and she continues to have a lot of tenderness on her left big toe. In the ISA, HITZ ([id number]) course of conversation we realize she was using alcohol to clean her left big toe and we have told her to stop this Objective Constitutional Pulse regular. Respirations normal and unlabored. Afebrile. Vitals Time Taken: 3:46 PM, Height: 63 in, Weight: 224 lbs, BMI: 39.7, Temperature: 98.4 F, Pulse: 89 bpm, Respiratory Rate: 17 breaths/min, Blood Pressure: 180/86 mmHg. Eyes Nonicteric. Reactive to light. Ears, Nose, Mouth, and Throat Lips, teeth, and gums WNL.Marland Kitchen Moist mucosa without lesions. Neck supple and nontender. No palpable supraclavicular or cervical adenopathy. Normal sized without goiter. Respiratory WNL. No retractions.. Cardiovascular Pedal Pulses WNL. No clubbing, cyanosis or edema. Lymphatic No adneopathy. No adenopathy. No adenopathy. Musculoskeletal Adexa without tenderness or enlargement.. Digits and nails w/o clubbing, cyanosis, infection, petechiae, ischemia, or inflammatory conditions.Marland Kitchen Psychiatric Judgement and insight Intact.. No evidence of depression, anxiety, or agitation.. General Notes: sharp debridement was done with a #3 curet and tenderness was significant. No active bleeding. Integumentary (Hair, Skin) No suspicious lesions. No crepitus or fluctuance. No peri-wound warmth or erythema. No masses.. Wound #3 status is Open. Original cause of wound was Gradually Appeared. The wound is located on the [name], [name]. ([id number]) Left,Medial Toe Great. The wound measures 1.5cm length x 0.3cm width x 0.2cm depth; 0.353cm^2 area and 0.071cm^3 volume. There is Fat Layer (Subcutaneous Tissue) Exposed exposed. There is no tunneling or undermining noted. There is a medium amount of serous drainage noted. The wound margin is distinct with the outline attached to the wound base. There is no granulation within the wound bed. There is a large (67-100%)  amount of necrotic tissue within the wound bed including Adherent Slough. The periwound skin appearance exhibited: Maceration. The periwound skin appearance did not exhibit: Callus, Crepitus, Excoriation, Induration, Rash, Scarring, Dry/Scaly, Atrophie Blanche, Cyanosis, Ecchymosis, Hemosiderin Staining, Mottled, Pallor, Rubor, Erythema. Periwound temperature was noted as No Abnormality. The periwound has tenderness on palpation. 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  Severity of Tissue Pre Debridement is: Fat layer exposed. There was a Skin/Subcutaneous Tissue Debridement ([id number]) debridement with total area of 0.45 sq cm performed by Evlyn KannerBritto, Keandrea Tapley, MD. with the following instrument(s): Curette to remove Non-Viable tissue/material including Exudate, Fat Layer (and Subcutaneous Tissue) Exposed, Fibrin/Slough, and Subcutaneous after achieving pain control using Lidocaine 4% Topical Solution. A time out was conducted at 15:52, prior to the start of the procedure. A Minimum amount of bleeding was controlled with Pressure. The procedure was tolerated well with a pain level of 3 throughout and a pain level of 3 following the procedure. Post Debridement Measurements: 1.5cm length x 0.3cm width x 0.2cm depth; 0.071cm^3 volume. Character of Wound/Ulcer Post Debridement requires further debridement. Severity of Tissue Post Debridement is: Fat layer exposed. Post procedure Diagnosis Wound #3: Same as  Pre-Procedure Ager, Kenidi J. ([id number]) Plan Wound Cleansing: Wound #3 Left,Medial Toe Great: Clean wound with Normal Saline. Cleanse wound with mild soap and water Anesthetic: Wound #3 Left,Medial Toe Great: Topical Lidocaine 4% cream applied to wound bed prior to debridement Primary Wound Dressing: Wound #3 Left,Medial Toe Great: Santyl Ointment Secondary Dressing: Wound #3 Left,Medial Toe Great: Dry Gauze Gauze and Kerlix/Conform Other - tape Dressing Change Frequency: Wound #3 Left,Medial Toe Great: Change dressing every day. Other: - PRN Follow-up Appointments: Wound #3 Left,Medial Toe Great: Return Appointment in 1 week. Edema Control: Wound #3 Left,Medial Toe Great: Elevate legs to the level of the heart and pump ankles as often as possible Additional Orders / Instructions: Wound #3 Left,Medial Toe Great: Stop Smoking Increase protein intake. Medications-please add to medication list.: Wound #3 Left,Medial Toe Great: Santyl Enzymatic Ointment I have recommended: 1. Santyl ointment to be continued to the left big toe daily. 2. continue to work on stopping cigarette smoking 3. Adequate protein, vitamin A, vitamin C and zinc 4. regular visits the wound center Kathleen PiggRRIS, Caylie J. ([id number]) Electronic Signature(s) Signed: 08/23/2016 4:10:30 PM By: Evlyn KannerBritto, Aspin Palomarez MD, FACS Entered By: Evlyn KannerBritto, Sanaai Doane on 08/23/2016 16:10:30 Montee, [name] ([id number]) -------------------------------------------------------------------------------- SuperBill Details Patient Name: Kathleen PiggPARRIS, Fusako J. Date of Service: 08/23/2016 Medical Record Number: [id number] Patient Account Number: [id number] Date of Birth/Sex: 05-02-1950 (66 y.o. Female) Treating RN: Afful, RN, BSN, [place] Primary Care Provider: Tonia GhentBENDER, ABBY Other Clinician: Referring Provider: Tonia GhentBENDER, ABBY Treating Provider/Extender: Rudene ReBritto, Blayke Cordrey Weeks in Treatment: 6 Diagnosis Coding ICD-10 Codes Code Description 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 2  diabetes mellitus with foot ulcer E11.622 Type 2 diabetes mellitus with other skin ulcer L97.511 Non-pressure chronic ulcer of other part of right foot limited to breakdown of skin L97.212 Non-pressure chronic ulcer of right calf with fat layer exposed F17.218 Nicotine dependence, cigarettes, with other nicotine-induced disorders Facility Procedures CPT4: Description Modifier Quantity Code [id number] 11042 - DEB SUBQ TISSUE 20 SQ CM/< 1 ICD-10 Description Diagnosis 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 2 diabetes mellitus with foot ulcer E11.622 Type 2 diabetes mellitus with other skin ulcer L97.511 Non-pressure chronic ulcer  of other part of right foot limited to breakdown of skin L97.212 Non-pressure chronic ulcer of right calf with fat layer exposed Physician Procedures CPT4: Description Modifier Quantity Code [id number] 11042 - WC PHYS SUBQ TISS 20 SQ CM 1 ICD-10 Description Diagnosis 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 2 diabetes mellitus with foot ulcer E11.622 Type 2 diabetes mellitus with other skin ulcer L97.511 Non-pressure chronic ulcer  of other part of right foot limited to breakdown of skin L97.212 Non-pressure chronic ulcer of right calf with fat layer exposed Electronic Signature(s) Signed: 08/23/2016 4:10:44 PM By: Evlyn KannerBritto, Brnadon Eoff MD, FACS Cowing, Alayshia J. (  [id number]) Entered By: Evlyn Kanner on 08/23/2016 16:10:44

## 2016-08-25 NOTE — Progress Notes (Signed)
[name], [name] ([id number]) Visit Report for 08/23/2016 Arrival Information Details Patient Name: [name], [name] Date of Service: 08/23/2016 3:30 PM Medical Record Number: [id number] Patient Account Number: [id number] Date of Birth/Sex: 12-06-1950 (66 y.o. Female) Treating RN: Afful, RN, BSN, [place] [facility] Primary Care Candon Caras: Tonia GhentBENDER, ABBY Other Clinician: Referring Sears Oran: Tonia GhentBENDER, ABBY Treating Kiasha Bellin/Extender: Rudene ReBritto, Errol Weeks in Treatment: 6 Visit Information History Since Last Visit All ordered tests and consults were completed: No Patient Arrived: Gilmer MorCane Added or deleted any medications: No Arrival Time: 15:45 Any new allergies or adverse reactions: No Accompanied By: self Had a fall or experienced change in No Transfer Assistance: None activities of daily living that may affect Patient Identification Verified: Yes risk of falls: Secondary Verification Process Completed: Yes Signs or symptoms of abuse/neglect since last No Patient Requires Transmission-Based No visito Precautions: Hospitalized since last visit: No Patient Has Alerts: Yes Has Dressing in Place as Prescribed: Yes Patient Alerts: Dm II Pain Present Now: No Electronic SignaturePettys) Signed: 08/23/2016 4:25:17 PM By: Elpidio EricAfful, Rita BSN, RN Entered By: Elpidio EricAfful, Rita on 08/23/2016 15:46:00 [name], [name]) -------------------------------------------------------------------------------- Encounter Discharge Information Details Patient Name: [name], [name] Date of Service: 08/23/2016 3:30 PM Medical Record Number: [id number] Patient Account Number: [id number] Date of Birth/Sex: 12-06-1950 (66 y.o. Female) Treating RN: Afful, RN, BSN, [place] [facility] Primary Care Keary Hanak: Tonia GhentBENDER, ABBY Other Clinician: Referring Shandora Koogler: Tonia GhentBENDER, ABBY Treating Gracemarie Skeet/Extender: Rudene ReBritto, Errol Weeks in Treatment: 6 Encounter Discharge Information Items Schedule Follow-up Appointment: No Medication Reconciliation completed No and provided  to Patient/Care Juana Montini: Provided on Clinical Summary of Care: 08/23/2016 Form Type Recipient Paper Patient EP Electronic SignaturePettys) Signed: 08/23/2016 4:01:24 PM By: Gwenlyn PerkingMoore, Shelia Entered By: Gwenlyn PerkingMoore, Shelia on 08/23/2016 16:01:24 [name], [name] ([id number]) -------------------------------------------------------------------------------- Lower Extremity Assessment Details Patient Name: [name], [name] Date of Service: 08/23/2016 3:30 PM Medical Record Number: [id number] Patient Account Number: [id number] Date of Birth/Sex: 12-06-1950 (66 y.o. Female) Treating RN: Afful, RN, BSN, [place] [facility] Primary Care Blandon Offerdahl: Tonia GhentBENDER, ABBY Other Clinician: Referring Takahiro Godinho: Tonia GhentBENDER, ABBY Treating Savaughn Karwowski/Extender: Rudene ReBritto, Errol Weeks in Treatment: 6 Vascular Assessment Claudication: Claudication Assessment [Left:None] Pulses: Dorsalis Pedis Palpable: [Left:Yes] Posterior Tibial Extremity colors, hair growth, and conditions: Extremity Color: [Left:Normal] Hair Growth on Extremity: [Left:No] Temperature of Extremity: [Left:Warm] Capillary Refill: [Left:< 3 seconds] Toe Nail Assessment Left: Right: Thick: Yes Discolored: Yes Deformed: No Improper Length and Hygiene: Yes Electronic SignaturePettys) Signed: 08/23/2016 4:25:17 PM By: Elpidio EricAfful, Rita BSN, RN Entered By: Elpidio EricAfful, Rita on 08/23/2016 15:52:36 [place], [name]) -------------------------------------------------------------------------------- Multi Wound Chart Details Patient Name: [name], [name] Date of Service: 08/23/2016 3:30 PM Medical Record Number: [id number] Patient Account Number: [id number] Date of Birth/Sex: 12-06-1950 (66 y.o. Female) Treating RN: Afful, RN, BSN, [place] [facility] Primary Care Bart Ashford: Tonia GhentBENDER, ABBY Other Clinician: Referring Samon Dishner: Tonia GhentBENDER, ABBY Treating Zoua Caporaso/Extender: Rudene ReBritto, Errol Weeks in Treatment: 6 Vital Signs HeightPettyin): 63 PulsePettybpm): 89 WeightPettylbs): 224 Blood Pressure 180/86 (mmHg): Body Mass  IndexPettyBMI): 40 TemperaturePettyF): 98.4 Respiratory Rate 17 (breaths/min): Photos: [3:No Photos] [N/A:N/A] Wound Location: [3:Left Toe Great - Medial] [N/A:N/A] Wounding Event: [3:Gradually Appeared] [N/A:N/A] Primary Etiology: [3:Diabetic Wound/Ulcer of the Lower Extremity] [N/A:N/A] Secondary Etiology: [3:Infection - not elsewhere classified] [N/A:N/A] Comorbid History: [3:Asthma, Chronic Obstructive Pulmonary Disease (COPD), Congestive Heart Failure, Hypertension, Type II Diabetes, Osteoarthritis, Neuropathy] [N/A:N/A] Date Acquired: [3:06/14/2016] [N/A:N/A] Weeks of Treatment: [3:6] [N/A:N/A] Wound Status: [3:Open] [N/A:N/A] Pending Amputation on Yes [N/A:N/A] Presentation: Measurements L x W x D 1.5x0.3x0.2 [N/A:N/A] (cm) Area (cm) : [3:0.353] [N/A:N/A] Volume (cm) : [3:0.071] [N/A:N/A] % Reduction in Area: [3:94.00%] [N/A:N/A] %  Reduction in Volume: 87.90% [N/A:N/A] Classification: [3:Grade 1] [N/A:N/A] Exudate Amount: [3:Medium] [N/A:N/A] Exudate Type: [3:Serous] [N/A:N/A] Exudate Color: [3:amber] [N/A:N/A] Wound Margin: [3:Distinct, outline attached] [N/A:N/A] Granulation Amount: None Present (0%) N/A N/A Necrotic Amount: Large (67-100%) N/A N/A Exposed Structures: Fat Layer (Subcutaneous N/A N/A Tissue) Exposed: Yes Fascia: No Tendon: No Muscle: No Joint: No Bone: No Epithelialization: None N/A N/A Debridement: Debridement (16109- N/A N/A 11047) Pre-procedure 15:52 N/A N/A Verification/Time Out Taken: Pain Control: Lidocaine 4% Topical N/A N/A Solution Tissue Debrided: Fibrin/Slough, Fat, N/A N/A Exudates, Subcutaneous Level: Skin/Subcutaneous N/A N/A Tissue Debridement Area (sq 0.45 N/A N/A cm): Instrument: Curette N/A N/A Bleeding: Minimum N/A N/A Hemostasis Achieved: Pressure N/A N/A Procedural Pain: 3 N/A N/A Post Procedural Pain: 3 N/A N/A Debridement Treatment Procedure was tolerated N/A N/A Response: well Post Debridement 1.5x0.3x0.2 N/A  N/A Measurements L x W x D (cm) Post Debridement 0.071 N/A N/A Volume: (cm) Periwound Skin Texture: Excoriation: No N/A N/A Induration: No Callus: No Crepitus: No Rash: No Scarring: No Periwound Skin Maceration: Yes N/A N/A Moisture: Dry/Scaly: No Periwound Skin Color: Atrophie Blanche: No N/A N/A Cyanosis: No Ecchymosis: No Erythema: No Hemosiderin Staining: No Mottled: No Slyter, [name] ([id number]) Pallor: No Rubor: No Temperature: No Abnormality N/A N/A Tenderness on Yes N/A N/A Palpation: Wound Preparation: Ulcer Cleansing: N/A N/A Rinsed/Irrigated with Saline Topical Anesthetic Applied: Other: lidocaine 4% Procedures Performed: Debridement N/A N/A Treatment Notes Wound #3 (Left, Medial Toe Great) 1. Cleansed with: Clean wound with Normal Saline 4. Dressing Applied: Santyl Ointment 5. Secondary Dressing Applied Dry Gauze Kerlix/Conform 7. Secured with Tape Notes darco Engineering geologist) Signed: 08/23/2016 4:08:42 PM By: Evlyn Kanner MD, FACS Entered By: Evlyn Kanner on 08/23/2016 16:08:42 Maltos, Kathleen Speak ([id number]) -------------------------------------------------------------------------------- Multi-Disciplinary Care Plan Details Patient Name: [name], KEATHLEY. Date of Service: 08/23/2016 3:30 PM Medical Record Number: [id number] Patient Account Number: [id number] Date of Birth/Sex: May 16, 1950 (66 y.o. Female) Treating RN: Afful, RN, BSN, [employer] Primary Care Laura-Lee Villegas: Tonia Ghent Other Clinician: Referring Jailynne Opperman: Tonia Ghent Treating Victor Granados/Extender: Rudene Re in Treatment: 6 Active Inactive ` Abuse / Safety / Falls / Self Care Management Nursing Diagnoses: Potential for falls Goals: Patient will remain injury free Date Initiated: 07/12/2016 Target Resolution Date: 10/26/2016 Goal Status: Active Interventions: Assess fall risk on admission and as needed Assess impairment of mobility on admission and as needed per  policy Notes: ` Nutrition Nursing Diagnoses: Imbalanced nutrition Impaired glucose control: actual or potential Potential for alteratiion in Nutrition/Potential for imbalanced nutrition Goals: Patient/caregiver agrees to and verbalizes understanding of need to use nutritional supplements and/or vitamins as prescribed Date Initiated: 07/12/2016 Target Resolution Date: 09/28/2016 Goal Status: Active Interventions: Assess patient nutrition upon admission and as needed per policy Provide education on elevated blood sugars and impact on wound healing Notes: SAVINA, OLSHEFSKI ([id number]) Orientation to the Wound Care Program Nursing Diagnoses: Knowledge deficit related to the wound healing center program Goals: Patient/caregiver will verbalize understanding of the Wound Healing Center Program Date Initiated: 07/12/2016 Target Resolution Date: 07/27/2016 Goal Status: Active Interventions: Provide education on orientation to the wound center Notes: ` Pain, Acute or Chronic Nursing Diagnoses: Pain, acute or chronic: actual or potential Potential alteration in comfort, pain Goals: Patient/caregiver will verbalize adequate pain control between visits Date Initiated: 07/12/2016 Target Resolution Date: 10/26/2016 Goal Status: Active Interventions: Assess comfort goal upon admission Complete pain assessment as per visit requirements Notes: ` Soft Tissue Infection Nursing Diagnoses: Impaired tissue integrity Knowledge deficit related to disease process  and management Knowledge deficit related to home infection control: handwashing, handling of soiled dressings, supply storage Goals: Patient/caregiver will verbalize understanding of or measures to prevent infection and contamination in the home setting Date Initiated: 07/12/2016 Target Resolution Date: 09/28/2016 Goal Status: Active [name], SAEZ ([id number]) Interventions: Assess signs and symptoms of infection every  visit Notes: ` Wound/Skin Impairment Nursing Diagnoses: Impaired tissue integrity Knowledge deficit related to smoking impact on wound healing Knowledge deficit related to ulceration/compromised skin integrity Goals: Ulcer/skin breakdown will have a volume reduction of 80% by week 12 Date Initiated: 07/12/2016 Target Resolution Date: 10/26/2016 Goal Status: Active Interventions: Assess patient/caregiver ability to perform ulcer/skin care regimen upon admission and as needed Assess ulcerationPettys) every visit Provide education on smoking Notes: Electronic SignaturePettys) Signed: 08/23/2016 4:25:17 PM By: Elpidio Eric BSN, RN Entered By: Elpidio Eric on 08/23/2016 15:52:41 Carcamo, Kathleen Speak ([id number]) -------------------------------------------------------------------------------- Pain Assessment Details Patient Name: Kathleen Pigg. Date of Service: 08/23/2016 3:30 PM Medical Record Number: [id number] Patient Account Number: [id number] Date of Birth/Sex: September 12, 1950 (66 y.o. Female) Treating RN: Afful, RN, BSN, [place] Sink Primary Care Kyren Knick: Tonia Ghent Other Clinician: Referring Dhyana Bastone: Tonia Ghent Treating Cynthia Stainback/Extender: Rudene Re in Treatment: 6 Active Problems Location of Pain Severity and Description of Pain Patient Has Paino No Site Locations With Dressing Change: No Pain Management and Medication Current Pain Management: Electronic SignaturePettys) Signed: 08/23/2016 4:25:17 PM By: Elpidio Eric BSN, RN Entered By: Elpidio Eric on 08/23/2016 15:46:07 [name], [name] ([id number]) -------------------------------------------------------------------------------- Wound Assessment Details Patient Name: Kathleen Pigg. Date of Service: 08/23/2016 3:30 PM Medical Record Number: [id number] Patient Account Number: [id number] Date of Birth/Sex: [birth year] (66 y.o. Female) Treating RN: Afful, RN, BSN, [employer] Primary Care Lucerito Rosinski: Tonia Ghent Other Clinician: Referring Jacquelynn Friend: Tonia Ghent Treating Mersedes Alber/Extender: Rudene Re in Treatment: 6 Wound Status Wound Number: 3 Primary Diabetic Wound/Ulcer of the Lower Etiology: Extremity Wound Location: Left Toe Great - Medial Secondary Infection - not elsewhere classified Wounding Event: Gradually Appeared Etiology: Date Acquired: 06/14/2016 Wound Open Weeks Of Treatment: 6 Status: Clustered Wound: No Comorbid Asthma, Chronic Obstructive Pending Amputation On Presentation History: Pulmonary Disease (COPD), Congestive Heart Failure, Hypertension, Type II Diabetes, Osteoarthritis, Neuropathy Photos Photo Uploaded By: Elpidio Eric on 08/23/2016 16:22:02 Wound Measurements Length: (cm) 1.5 Width: (cm) 0.3 Depth: (cm) 0.2 Area: (cm) 0.353 Volume: (cm) 0.071 % Reduction in Area: 94% % Reduction in Volume: 87.9% Epithelialization: None Tunneling: No Undermining: No Wound Description Classification: Grade 1 Foul Odor Aft Wound Margin: Distinct, outline attached Slough/Fibrin Exudate Amount: Medium Exudate Type: Serous Exudate Color: amber er Cleansing: No o Yes Wound Bed Kendall, [name] ([id number]) Granulation Amount: None Present (0%) Exposed Structure Necrotic Amount: Large (67-100%) Fascia Exposed: No Necrotic Quality: Adherent Slough Fat Layer (Subcutaneous Tissue) Exposed: Yes Tendon Exposed: No Muscle Exposed: No Joint Exposed: No Bone Exposed: No Periwound Skin Texture Texture Color No Abnormalities Noted: No No Abnormalities Noted: No Callus: No Atrophie Blanche: No Crepitus: No Cyanosis: No Excoriation: No Ecchymosis: No Induration: No Erythema: No Rash: No Hemosiderin Staining: No Scarring: No Mottled: No Pallor: No Moisture Rubor: No No Abnormalities Noted: No Dry / Scaly: No Temperature / Pain Maceration: Yes Temperature: No Abnormality Tenderness on Palpation: Yes Wound Preparation Ulcer Cleansing: Rinsed/Irrigated with Saline Topical Anesthetic  Applied: Other: lidocaine 4%, Treatment Notes Wound #3 (Left, Medial Toe Great) 1. Cleansed with: Clean wound with Normal Saline 4. Dressing Applied: Santyl Ointment 5. Secondary Dressing Applied Dry Gauze Kerlix/Conform 7. Secured with Tape Notes darco shoe  Electronic SignaturePettys) Signed: 08/23/2016 4:25:17 PM By: Elpidio Eric BSN, RN Entered By: Elpidio Eric on 08/23/2016 15:51:54 Lorson, Kathleen Speak ([id number]) -------------------------------------------------------------------------------- Vitals Details Patient Name: Kathleen Pigg. Date of Service: 08/23/2016 3:30 PM Medical Record Number: [id number] Patient Account Number: [id number] Date of Birth/Sex: 05-23-1950 (66 y.o. Female) Treating RN: Afful, RN, BSN, [place] [facility] Primary Care Kitana Gage: Tonia Ghent Other Clinician: Referring Tanise Russman: Tonia Ghent Treating Jalaina Salyers/Extender: Rudene Re in Treatment: 6 Vital Signs Time Taken: 15:46 Temperature (F): 98.4 Height (in): 63 Pulse (bpm): 89 Weight (lbs): 224 Respiratory Rate (breaths/min): 17 Body Mass Index (BMI): 39.7 Blood Pressure (mmHg): 180/86 Reference Range: 80 - 120 mg / dl Electronic SignaturePettys) Signed: 08/23/2016 4:25:17 PM By: Elpidio Eric BSN, RN Entered By: Elpidio Eric on 08/23/2016 15:46:27

## 2016-08-30 ENCOUNTER — Encounter: Payer: Medicare HMO | Admitting: Surgery

## 2016-08-30 DIAGNOSIS — E11621 Type 2 diabetes mellitus with foot ulcer: Secondary | ICD-10-CM | POA: Diagnosis not present

## 2016-09-01 NOTE — Progress Notes (Addendum)
[name], Ladonya J. ([id number]) Visit Report for 08/30/2016 Chief Complaint Document Details Patient Name: [name], Ronalee J. Date of Service: 08/30/2016 3:30 PM Medical Record Number: [id number] Patient Account Number: [id number] Date of Birth/Sex: [birth year] (66 y.o. Female) Treating RN: Afful, RN, BSN, [place] Sinkita Primary Care Provider: Tonia GhentBENDER, ABBY Other Clinician: Referring Provider: Tonia GhentBENDER, ABBY Treating Provider/Extender: Rudene ReBritto, Aleaya Latona Weeks in Treatment: 7 Information Obtained from: Patient Chief Complaint [name] presents today for evaluation of her left medial hallux and left lateral lower extremity ulcers Electronic Signature(s) Signed: 08/30/2016 4:06:09 PM By: Evlyn KannerBritto, Abigael Mogle MD, FACS Entered By: Evlyn KannerBritto, Emojean Gertz on 08/30/2016 16:06:08 Mcgillis, Joleena Shela CommonsJ. ([id number]) -------------------------------------------------------------------------------- Debridement Details Patient Name: Kathleen PiggPARRIS, Lunna J. Date of Service: 08/30/2016 3:30 PM Medical Record Number: [id number] Patient Account Number: [id number] Date of Birth/Sex: [birth year] (66 y.o. Female) Treating RN: Huel CoventryWoody, Kim Primary Care Provider: Tonia GhentBENDER, ABBY Other Clinician: Referring Provider: Tonia GhentBENDER, ABBY Treating Provider/Extender: Rudene ReBritto, Amauri Keefe Weeks in Treatment: 7 Debridement Performed for Wound #3 Left,Medial Toe Great Assessment: Performed By: Physician Evlyn KannerBritto, Oluwatosin Higginson, MD Debridement: Debridement Severity of Tissue Pre Fat layer exposed Debridement: Pre-procedure Verification/Time Out Yes - 15:51 Taken: Start Time: 15:51 Pain Control: Lidocaine 4% Topical Solution Total Area Debrided (L x 1.6 (cm) x 0.4 (cm) = 0.64 (cm) W): Tissue and other Non-Viable, Exudate, Fat, Fibrin/Slough, Subcutaneous material debrided: Instrument: Curette Bleeding: Minimum Hemostasis Achieved: Pressure End Time: 15:54 Procedural Pain: 3 Post Procedural Pain: 3 Response to Treatment: Procedure was tolerated well Post Debridement Measurements of  Total Wound Length: (cm) 1.6 Width: (cm) 0.4 Depth: (cm) 0.2 Volume: (cm) 0.101 Character of Wound/Ulcer Post Requires Further Debridement Debridement: Severity of Tissue Post Debridement: Fat layer exposed Post Procedure Diagnosis Same as Pre-procedure Electronic Signature(s) Signed: 09/09/2016 10:56:57 AM By: Elliot GurneyWoody, BSN, RN, CWS, [name], BSN Signed: 09/09/2016 3:11:11 PM By: Evlyn KannerBritto, Kristyana Notte MD, FACS Previous Signature: 08/30/2016 4:06:02 PM Version By: Evlyn KannerBritto, Kinzy Weyers MD, FACS Previous Signature: 08/30/2016 4:19:25 PM Version By: Elpidio EricAfful, Rita BSN, RN Fann, Cathern Shela CommonsJ. ([id number]) Entered By: Elliot GurneyWoody, BSN, RN, CWS, Kim on 09/09/2016 10:56:57 Slifer, [name] ([id number]) -------------------------------------------------------------------------------- HPI Details Patient Name: [name], Shayma J. Date of Service: 08/30/2016 3:30 PM Medical Record Number: [id number] Patient Account Number: [id number] Date of Birth/Sex: [birth year] (66 y.o. Female) Treating RN: Afful, RN, BSN, Psychologist, clinicalita Primary Care Provider: Tonia GhentBENDER, ABBY Other Clinician: Referring Provider: Tonia GhentBENDER, ABBY Treating Provider/Extender: Rudene ReBritto, Novia Lansberry Weeks in Treatment: 7 History of Present Illness Location: left medial hallux; LLE lateral Duration: left medial hallux-4 weeks; LLE lateral-2-1/2 weeks Timing: left medial hallux-pain with pressure/standing, ambulating, manipulation; LLE lateral-pain with manipulation Context: left medial hallux-originally was a crack in the skin; LLE lateral-traumatic injury with car door Modifying Factors: uncontrolled diabetes and smoking are exacerbating factors to wound healing HPI Description: 07/12/16- the patient arrives for initial evaluation of a left hallux and left lower extremity lateral ulcer. She states that the left hallux originally had a crack in the skin which she began to apply AandD ointment. [facility], while going into her appointment she  struck her left lateral leg on the car door. At that appointment she was given a prescription for Bactrim and completed that on 4/14 with no adverse reaction. She states that it made little to no improvement in her toe wound. She went back for a follow-up visit on 4/18 and was prescribed doxycycline for 10 days. She continues to apply AandD topical ointment along with soaking her foot in warm water. She is a diabetic, most  recent A1c of 9% (I do not have records of this, this is per patient report). She is both on oral agents and insulin. She is a current smoker, approximately 0.5 ppd. She denies any remote or recent evaluation regarding arterial or venous disease. 07/19/16 Patient presents today for follow-up evaluation concerning her left great toe wound immediately and left lateral lower extremity wound. Unfortunately though both wounds appear better visually she is having a lot of discomfort. I'm unsure if this is a preformed drop of the issue or potentially the ideas or appointment is causing irritation which has caused her discomfort and pain. The good news is her wound culture came back negative and showed only normal skin flora and her x-ray of the left foot was also negative for any osseous change or osteomyelitis. Unfortunately she continues to have a lot of discomfort she would rate it a seven out of 10 right now. 07/26/2016 -- she is doing much better since the Iodoflex was stopped and she is on Santyl. Her culture report was negative and only had normal skin flora. She continues to smoke about a half pack of cigarettes a day. 08/05/2016 -- she is doing much better with her smoking and is down to about 5 cigarettes a day. The left lower extremity wound is healed and she continues to have a lot of tenderness on her left big toe. In the course of conversation we realize she was using alcohol to clean her left big toe and we have told her to stop this Electronic Signature(s) Signed:  08/30/2016 4:06:14 PM By: Evlyn Kanner MD, FACS Entered By: Evlyn Kanner on 08/30/2016 16:06:14 Verstraete, Anahid J. ([id number]) Jue, Ruari Shela Commons ([id number]) -------------------------------------------------------------------------------- Physical Exam Details Patient Name: SERRINA, MINOGUE. Date of Service: 08/30/2016 3:30 PM Medical Record Number: [id number] Patient Account Number: [id number] Date of Birth/Sex: 02/05/1951 (66 y.o. Female) Treating RN: Afful, RN, BSN, [employer] Primary Care Provider: Tonia Ghent Other Clinician: Referring Provider: Tonia Ghent Treating Provider/Extender: Rudene Re in Treatment: 7 Constitutional . Pulse regular. Respirations normal and unlabored. Afebrile. . Eyes Nonicteric. Reactive to light. Ears, Nose, Mouth, and Throat Lips, teeth, and gums WNL.Marland Kitchen Moist mucosa without lesions. Neck supple and nontender. No palpable supraclavicular or cervical adenopathy. Normal sized without goiter. Respiratory WNL. No retractions.. Breath sounds WNL, No rubs, rales, rhonchi, or wheeze.. Cardiovascular Heart rhythm and rate regular, no murmur or gallop.. Pedal Pulses WNL. No clubbing, cyanosis or edema. Chest Breasts symmetical and no nipple discharge.. Breast tissue WNL, no masses, lumps, or tenderness.. Lymphatic No adneopathy. No adenopathy. No adenopathy. Musculoskeletal Adexa without tenderness or enlargement.. Digits and nails w/o clubbing, cyanosis, infection, petechiae, ischemia, or inflammatory conditions.. Integumentary (Hair, Skin) No suspicious lesions. No crepitus or fluctuance. No peri-wound warmth or erythema. No masses.Marland Kitchen Psychiatric Judgement and insight Intact.. No evidence of depression, anxiety, or agitation.. Notes after sharp debridement with a #3 curet there was good granulation tissue at the base and this bleeding controlled with pressure Electronic Signature(s) Signed: 08/30/2016 4:06:34 PM By: Evlyn Kanner MD, FACS Entered By: Evlyn Kanner on 08/30/2016 16:06:34 Bickle, [name] ([id number]) -------------------------------------------------------------------------------- Physician Orders Details Patient Name: SAVEAH, BAHAR. Date of Service: 08/30/2016 3:30 PM Medical Record Number: [id number] Patient Account Number: [id number] Date of Birth/Sex: 1950-12-07 (66 y.o. Female) Treating RN: Afful, RN, BSN, [place] Sink Primary Care Provider: Tonia Ghent Other Clinician: Referring Provider: Tonia Ghent Treating Provider/Extender: Rudene Re in Treatment: 7 Verbal / Phone Orders: No Diagnosis Coding Wound Cleansing Wound #3 Left,Medial Toe  Great o Clean wound with Normal Saline. o Cleanse wound with mild soap and water Anesthetic Wound #3 Left,Medial Toe Great o Topical Lidocaine 4% cream applied to wound bed prior to debridement Primary Wound Dressing Wound #3 Left,Medial Toe Great o Santyl Ointment Secondary Dressing Wound #3 Left,Medial Toe Great o Dry Gauze o Gauze and Kerlix/Conform o Other - tape Dressing Change Frequency Wound #3 Left,Medial Toe Great o Change dressing every day. o Other: - PRN Follow-up Appointments Wound #3 Left,Medial Toe Great o Return Appointment in 1 week. Edema Control Wound #3 Left,Medial Toe Great o Elevate legs to the level of the heart and pump ankles as often as possible Additional Orders / Instructions Wound #3 Left,Medial Toe Great o Stop Smoking Torrence, Cay J. ([id number]) o Increase protein intake. Medications-please add to medication list. Wound #3 Left,Medial Toe Great o Santyl Enzymatic Ointment Electronic Signature(s) Signed: 08/30/2016 4:12:58 PM By: Evlyn Kanner MD, FACS Signed: 08/30/2016 4:19:25 PM By: Elpidio Eric BSN, RN Entered By: Elpidio Eric on 08/30/2016 15:54:12 Luscher, [name] ([id number]) -------------------------------------------------------------------------------- Problem List Details Patient Name: MARISE, KNAPPER. Date of Service: 08/30/2016 3:30 PM Medical Record Number: [id number] Patient Account Number: [id number] Date of Birth/Sex: [birth year] (66 y.o. 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 ([id number]) Mcclinton, Mikaila J. ([id number]) -------------------------------------------------------------------------------- Progress Note Details Patient Name: YASHIRA, OFFENBERGER. Date of Service: 08/30/2016 3:30 PM Medical Record Number: [id number] Patient Account Number: [id number] Date of Birth/Sex: [birth year] (66 y.o. Female) Treating RN: Afful, RN, BSN, Psychologist, clinical Primary Care Provider: Tonia Ghent Other Clinician: Referring Provider: Tonia Ghent Treating Provider/Extender: Rudene Re in Treatment: 7 Subjective Chief Complaint Information obtained from Patient Mrs. Bobb presents today for evaluation of her left medial hallux and left lateral lower  extremity ulcers History of Present Illness (HPI) The following HPI elements were documented for the patient's wound: Location: left medial hallux; LLE lateral Duration: left medial hallux-4 weeks; LLE lateral-2-1/2 weeks Timing: left medial hallux-pain with pressure/standing, ambulating, manipulation; LLE lateral-pain with manipulation Context: left medial hallux-originally was a crack in the skin; LLE lateral-traumatic injury with car door Modifying Factors: uncontrolled diabetes and smoking are exacerbating factors to wound healing 07/12/16- the patient arrives for initial evaluation of a left hallux and left lower extremity lateral ulcer. She states that the left hallux originally had a crack in the skin which she began to apply AandD ointment. She went to her PCP at [facility] on 4/4, while going into her appointment she struck her left lateral leg on the car door. At that appointment she was given a prescription for Bactrim and completed that on 4/14 with  no adverse reaction. She states that it made little to no improvement in her toe wound. She went back for a follow-up visit on 4/18 and was prescribed doxycycline for 10 days. She continues to apply AandD topical ointment along with soaking her foot in warm water. She is a diabetic, most recent A1c of 9% (I do not have records of this, this is per patient report). She is both on oral agents and insulin. She is a current smoker, approximately 0.5 ppd. She denies any remote or recent evaluation regarding arterial or venous disease. 07/19/16 Patient presents today for follow-up evaluation concerning her left great toe wound immediately and left lateral lower extremity wound. Unfortunately though both wounds appear better visually she is having a lot of discomfort. I'm unsure if this is a preformed drop of the issue or potentially the ideas or appointment is causing irritation which has caused her discomfort and pain. The  good news is her wound culture came back negative and showed only normal skin flora and her x-ray of the left foot was also negative for any osseous change or osteomyelitis. Unfortunately she continues to have a lot of discomfort she would rate it a seven out of 10 right now. 07/26/2016 -- she is doing much better since the Iodoflex was stopped and she is on Santyl. Her culture report was negative and only had normal skin flora. She continues to smoke about a half pack of cigarettes a day. 08/05/2016 -- she is doing much better with her smoking and is down to about 5 cigarettes a day. The left lower extremity wound is healed and she continues to have a lot of tenderness on her left big toe. In the ANSLEE, MICHELETTI ([id number]) course of conversation we realize she was using alcohol to clean her left big toe and we have told her to stop this Objective Constitutional Pulse regular. Respirations normal and unlabored. Afebrile. Vitals Time Taken: 3:44 PM, Height: 63 in, Weight: 224 lbs, BMI: 39.7, Temperature: 98.0 F, Pulse: 80 bpm, Respiratory Rate: 17 breaths/min, Blood Pressure: 146/83 mmHg. Eyes Nonicteric. Reactive to light. Ears, Nose, Mouth, and Throat Lips, teeth, and gums WNL.Marland Kitchen Moist mucosa without lesions. Neck supple and nontender. No palpable supraclavicular or cervical adenopathy. Normal sized without goiter. Respiratory WNL. No retractions.. Breath sounds WNL, No rubs, rales, rhonchi, or wheeze.. Cardiovascular Heart rhythm and rate regular, no murmur or gallop.. Pedal Pulses WNL. No clubbing, cyanosis or edema. Chest Breasts symmetical and no nipple discharge.. Breast tissue WNL, no masses, lumps, or tenderness.. Lymphatic No adneopathy. No adenopathy. No adenopathy. Musculoskeletal Adexa without tenderness or enlargement.. Digits and nails w/o clubbing, cyanosis, infection, petechiae, ischemia, or inflammatory conditions.Marland Kitchen Psychiatric Judgement and insight Intact.. No  evidence of depression, anxiety, or agitation.. General Notes: after sharp debridement with a #3 curet there was good granulation tissue at the base and this bleeding controlled with pressure Integumentary (Hair, Skin) Sublette, Mykia J. ([id number]) No suspicious lesions. No crepitus or fluctuance. No peri-wound warmth or erythema. No masses.. Wound #3 status is Open. Original cause of wound was Gradually Appeared. The wound is located on the [employer]. The wound measures 1.6cm length x 0.4cm width x 0.2cm depth; 0.503cm^2 area and 0.101cm^3 volume. There is Fat Layer (Subcutaneous Tissue) Exposed exposed. There is no tunneling or undermining noted. There is a medium amount of serous drainage noted. The wound margin is distinct with the outline attached to the wound base. There is no granulation within the wound bed. There is  a large (67-100%) amount of necrotic tissue within the wound bed including Adherent Slough. The periwound skin appearance did not exhibit: Callus, Crepitus, Excoriation, Induration, Rash, Scarring, Dry/Scaly, Maceration, Atrophie Blanche, Cyanosis, Ecchymosis, Hemosiderin Staining, Mottled, Pallor, Rubor, Erythema. Periwound temperature was noted as No Abnormality. The periwound has tenderness on palpation. Assessment Active Problems ICD-10 E11.621 - Type 2 diabetes mellitus with foot ulcer E11.622 - Type 2 diabetes mellitus with other skin ulcer F17.218 - Nicotine dependence, cigarettes, with other nicotine-induced disorders L97.522 - Non-pressure chronic ulcer of other part of left foot with fat layer exposed Procedures Wound #3 Pre-procedure diagnosis of Wound #3 is a Diabetic Wound/Ulcer of the Lower Extremity located on the Left,Medial Toe Great .Severity of Tissue Pre Debridement is: Fat layer exposed. There was a Debridement ([id number]) debridement with total area of 0.64 sq cm performed by Evlyn Kanner, MD. with the following instrument(s): Curette  to remove Non-Viable tissue/material including Exudate, Fat Layer (and Subcutaneous Tissue) Exposed, Fibrin/Slough, and Subcutaneous after achieving pain control using Lidocaine 4% Topical Solution. A time out was conducted at 15:51, prior to the start of the procedure. A Minimum amount of bleeding was controlled with Pressure. The procedure was tolerated well with a pain level of 3 throughout and a pain level of 3 following the procedure. Post Debridement Measurements: 1.6cm length x 0.4cm width x 0.2cm depth; 0.101cm^3 volume. Character of Wound/Ulcer Post Debridement requires further debridement. Severity of Tissue Post Debridement is: Fat layer exposed. Post procedure Diagnosis Wound #3: Same as Pre-Procedure Ku, Analynn J. ([id number]) Plan Wound Cleansing: Wound #3 Left,Medial Toe Great: Clean wound with Normal Saline. Cleanse wound with mild soap and water Anesthetic: Wound #3 Left,Medial Toe Great: Topical Lidocaine 4% cream applied to wound bed prior to debridement Primary Wound Dressing: Wound #3 Left,Medial Toe Great: Santyl Ointment Secondary Dressing: Wound #3 Left,Medial Toe Great: Dry Gauze Gauze and Kerlix/Conform Other - tape Dressing Change Frequency: Wound #3 Left,Medial Toe Great: Change dressing every day. Other: - PRN Follow-up Appointments: Wound #3 Left,Medial Toe Great: Return Appointment in 1 week. Edema Control: Wound #3 Left,Medial Toe Great: Elevate legs to the level of the heart and pump ankles as often as possible Additional Orders / Instructions: Wound #3 Left,Medial Toe Great: Stop Smoking Increase protein intake. Medications-please add to medication list.: Wound #3 Left,Medial Toe Great: Santyl Enzymatic Ointment After sharp debridement today and noted that there has been significant improvement over the last week. I have recommended: 1. Santyl ointment to be continued to the left big toe daily. 2. continue to work on stopping cigarette  smoking 3. Adequate protein, vitamin A, vitamin C and zinc 4. regular visits the wound center VIVICA, DOBOSZ ([id number]) Electronic Signature(s) Signed: 09/09/2016 10:57:38 AM By: Elliot Gurney, BSN, RN, CWS, [name], BSN Signed: 09/09/2016 3:11:11 PM By: Evlyn Kanner MD, FACS Previous Signature: 08/30/2016 4:16:26 PM Version By: Evlyn Kanner MD, FACS Previous Signature: 08/30/2016 4:07:16 PM Version By: Evlyn Kanner MD, FACS Entered By: Elliot Gurney, BSN, RN, CWS, Kim on 09/09/2016 10:57:38 Dark, [name] ([id number]) -------------------------------------------------------------------------------- SuperBill Details Patient Name: KATHARINE, ROCHEFORT. Date of Service: 08/30/2016 Medical Record Number: [id number] Patient Account Number: [id number] Date of Birth/Sex: April 14, 1950 (66 y.o. Female) Treating RN: Afful, RN, BSN, [place] Sink Primary Care Provider: Tonia Ghent Other Clinician: Referring Provider: Tonia Ghent Treating Provider/Extender: Rudene Re in Treatment: 7 Diagnosis Coding ICD-10 Codes Code Description 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 2 diabetes mellitus with foot ulcer E11.622 Type 2 diabetes mellitus with other skin ulcer F17.218 Nicotine dependence, cigarettes, with  other nicotine-induced disorders L97.522 Non-pressure chronic ulcer of other part of left foot with fat layer exposed Facility Procedures CPT4 Code Description: 16109604 11042 - DEB SUBQ TISSUE 20 SQ CM/< ICD-10 Description Diagnosis 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 2 diabetes mellitus with foot ulcer L97.522 Non-pressure chronic ulcer of other part of left foot Modifier: with fat lay Quantity: 1 er exposed Physician Procedures CPT4 Code Description: 5409811 11042 - WC PHYS SUBQ TISS 20 SQ CM ICD-10 Description Diagnosis 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 2 diabetes mellitus with foot ulcer L97.522 Non-pressure chronic ulcer of other part of left foot Modifier: with fat laye Quantity: 1 r exposed Electronic Signature(s) Signed: 09/09/2016 10:57:20 AM By: Elliot Gurney, BSN, RN, CWS, [name],  BSN Signed: 09/09/2016 3:11:11 PM By: Evlyn Kanner MD, FACS Previous Signature: 08/30/2016 4:12:41 PM Version By: Evlyn Kanner MD, FACS Entered By: Elliot Gurney, BSN, RN, CWS, Kim on 09/09/2016 10:57:20

## 2016-09-01 NOTE — Progress Notes (Signed)
[name] ([id number]) Visit Report for 08/30/2016 Arrival Information Details Patient Name: [name], [name]. Date of Service: 08/30/2016 3:30 PM Medical Record Number: [id number] Patient Account Number: [id number] Date of Birth/Sex: [birth year] (66 y.o. Female) Treating RN: Afful, RN, BSN, [place] Sink Primary Care Bev Drennen: Tonia Ghent Other Clinician: Referring Cedrick Partain: Tonia Ghent Treating Amaia Lavallie/Extender: Rudene Re in Treatment: 7 Visit Information History Since Last Visit All ordered tests and consults were completed: No Patient Arrived: Gilmer Mor Added or deleted any medications: No Arrival Time: 15:39 Any new allergies or adverse reactions: No Accompanied By: self Had a fall or experienced change in No Transfer Assistance: None activities of daily living that may affect Patient Identification Verified: Yes risk of falls: Secondary Verification Process Completed: Yes Signs or symptoms of abuse/neglect since last No Patient Requires Transmission-Based No visito Precautions: Hospitalized since last visit: No Patient Has Alerts: Yes Has Dressing in Place as Prescribed: Yes Patient Alerts: Dm II Pain Present Now: Yes Electronic Signature(s) Signed: 08/30/2016 4:19:25 PM By: Elpidio Eric BSN, RN Entered By: Elpidio Eric on 08/30/2016 15:44:13 Kirkendoll, [name] ([id number]) -------------------------------------------------------------------------------- Encounter Discharge Information Details Patient Name: [name]. Date of Service: 08/30/2016 3:30 PM Medical Record Number: [id number] Patient Account Number: [id number] Date of Birth/Sex: 07-Sep-1950 (66 y.o. Female) Treating RN: Afful, RN, BSN, [place] Sink Primary Care Callee Rohrig: Tonia Ghent Other Clinician: Referring Dawn Convery: Tonia Ghent Treating Kynnedi Zweig/Extender: Rudene Re in Treatment: 7 Encounter Discharge Information Items Discharge Pain Level: 0 Discharge Condition: Stable Ambulatory Status: Cane Discharge  Destination: Home Transportation: Private Auto Accompanied By: self Schedule Follow-up Appointment: No Medication Reconciliation completed No and provided to Patient/Care Maris Bena: Provided on Clinical Summary of Care: 08/30/2016 Form Type Recipient Paper Patient EP Electronic Signature(s) Signed: 08/30/2016 4:00:21 PM By: Francie Massing Entered By: Francie Massing on 08/30/2016 16:00:21 Boettcher, Sherley J. ([id number]) -------------------------------------------------------------------------------- Lower Extremity Assessment Details Patient Name: [name]. Date of Service: 08/30/2016 3:30 PM Medical Record Number: [id number] Patient Account Number: [id number] Date of Birth/Sex: 1950/10/24 (66 y.o. Female) Treating RN: Afful, RN, BSN, [place] Sink Primary Care Vidal Lampkins: Tonia Ghent Other Clinician: Referring Ember Gottwald: Tonia Ghent Treating Dorse Locy/Extender: Rudene Re in Treatment: 7 Edema Assessment Assessed: [Left: No] [Right: No] Edema: [Left: N] [Right: o] Vascular Assessment Claudication: Claudication Assessment [Left:None] Pulses: Dorsalis Pedis Palpable: [Left:Yes] Posterior Tibial Extremity colors, hair growth, and conditions: Extremity Color: [Left:Normal] Hair Growth on Extremity: [Left:No] Temperature of Extremity: [Left:Warm] Capillary Refill: [Left:< 3 seconds] Toe Nail Assessment Left: Right: Thick: Yes Discolored: Yes Deformed: No Improper Length and Hygiene: No Electronic Signature(s) Signed: 08/30/2016 4:19:25 PM By: Elpidio Eric BSN, RN Entered By: Elpidio Eric on 08/30/2016 15:45:18 Ashlock, Emonii J. ([id number]) -------------------------------------------------------------------------------- Multi Wound Chart Details Patient Name: [name]. Date of Service: 08/30/2016 3:30 PM Medical Record Number: [id number] Patient Account Number: [id number] Date of Birth/Sex: Mar 28, 1950 (66 y.o. Female) Treating RN: Afful, RN, BSN, [place] Sink Primary Care Carr Shartzer: Tonia Ghent Other Clinician: Referring Arthea Nobel: Tonia Ghent Treating Ikran Patman/Extender: Rudene Re in Treatment: 7 Vital Signs Height(in): 63 Pulse(bpm): 80 Weight(lbs): 224 Blood Pressure 146/83 (mmHg): Body Mass Index(BMI): 40 Temperature(F): 98.0 Respiratory Rate 17 (breaths/min): Photos: [3:No Photos] [N/A:N/A] Wound Location: [3:Left Toe Great - Medial] [N/A:N/A] Wounding Event: [3:Gradually Appeared] [N/A:N/A] Primary Etiology: [3:Diabetic Wound/Ulcer of the Lower Extremity] [N/A:N/A] Secondary Etiology: [3:Infection - not elsewhere classified] [N/A:N/A] Comorbid History: [3:Asthma, Chronic Obstructive Pulmonary Disease (COPD), Congestive Heart Failure, Hypertension, Type II Diabetes, Osteoarthritis, Neuropathy] [N/A:N/A] Date Acquired: [3:06/14/2016] [N/A:N/A] Weeks of Treatment: [3:7] [N/A:N/A] Wound Status: [3:Open] [N/A:N/A]  Pending Amputation on Yes [N/A:N/A] Presentation: Measurements L x W x D 1.6x0.4x0.2 [N/A:N/A] (cm) Area (cm) : [3:0.503] [N/A:N/A] Volume (cm) : [3:0.101] [N/A:N/A] % Reduction in Area: [3:91.50%] [N/A:N/A] % Reduction in Volume: 82.90% [N/A:N/A] Classification: [3:Grade 1] [N/A:N/A] Exudate Amount: [3:Medium] [N/A:N/A] Exudate Type: [3:Serous] [N/A:N/A] Exudate Color: [3:amber] [N/A:N/A] Wound Margin: [3:Distinct, outline attached] [N/A:N/A] Granulation Amount: None Present (0%) N/A N/A Necrotic Amount: Large (67-100%) N/A N/A Exposed Structures: Fat Layer (Subcutaneous N/A N/A Tissue) Exposed: Yes Fascia: No Tendon: No Muscle: No Joint: No Bone: No Epithelialization: None N/A N/A Debridement: Open Wound/Selective N/A N/A ([id number]) - Selective Pre-procedure 15:51 N/A N/A Verification/Time Out Taken: Pain Control: Lidocaine 4% Topical N/A N/A Solution Tissue Debrided: Fibrin/Slough, Fat, N/A N/A Exudates, Subcutaneous Level: Non-Viable Tissue N/A N/A Debridement Area (sq 0.64 N/A N/A cm): Instrument: Curette N/A  N/A Bleeding: Minimum N/A N/A Hemostasis Achieved: Pressure N/A N/A Procedural Pain: 3 N/A N/A Post Procedural Pain: 3 N/A N/A Debridement Treatment Procedure was tolerated N/A N/A Response: well Post Debridement 1.6x0.4x0.2 N/A N/A Measurements L x W x D (cm) Post Debridement 0.101 N/A N/A Volume: (cm) Periwound Skin Texture: Excoriation: No N/A N/A Induration: No Callus: No Crepitus: No Rash: No Scarring: No Periwound Skin Maceration: No N/A N/A Moisture: Dry/Scaly: No Periwound Skin Color: Atrophie Blanche: No N/A N/A Cyanosis: No Ecchymosis: No Erythema: No Hemosiderin Staining: No Mottled: No Pallor: No Rubor: No Lampe, Nastasia J. ([id number]) Temperature: No Abnormality N/A N/A Tenderness on Yes N/A N/A Palpation: Wound Preparation: Ulcer Cleansing: N/A N/A Rinsed/Irrigated with Saline Topical Anesthetic Applied: Other: lidocaine 4% Procedures Performed: Debridement N/A N/A Treatment Notes Wound #3 (Left, Medial Toe Great) 1. Cleansed with: Clean wound with Normal Saline 4. Dressing Applied: Santyl Ointment 5. Secondary Dressing Applied Gauze and Kerlix/Conform 7. 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 ([id number]) -------------------------------------------------------------------------------- Multi-Disciplinary Care Plan Details Patient Name: Kathleen PiggRRIS, Emberlynn J. Date of Service: 08/30/2016 3:30 PM Medical Record Number: [id number] Patient Account Number: [id number] Date of Birth/Sex: 08-Jun-1950 (66 y.o. Female) Treating RN: Afful, RN, BSN, [employer] Primary Care Azizah Lisle: Tonia GhentBENDER, ABBY Other Clinician: Referring Maecy Podgurski: Tonia GhentBENDER, ABBY Treating Benjerman Molinelli/Extender: Rudene ReBritto, Errol Weeks in Treatment: 7 Active Inactive ` Abuse / Safety / Falls / Self Care Management Nursing Diagnoses: Potential for falls Goals: Patient  will remain injury free Date Initiated: 07/12/2016 Target Resolution Date: 10/26/2016 Goal Status: Active Interventions: Assess fall risk on admission and as needed Assess impairment of mobility on admission and as needed per policy Notes: ` Nutrition Nursing Diagnoses: Imbalanced nutrition Impaired glucose control: actual or potential Potential for alteratiion in Nutrition/Potential for imbalanced nutrition Goals: Patient/caregiver agrees to and verbalizes understanding of need to use nutritional supplements and/or vitamins as prescribed Date Initiated: 07/12/2016 Target Resolution Date: 09/28/2016 Goal Status: Active Interventions: Assess patient nutrition upon admission and as needed per policy Provide education on elevated blood sugars and impact on wound healing Notes: [name]` Egnew, Nnenna J. ([id number]) Orientation to the Wound Care Program Nursing Diagnoses: Knowledge deficit related to the wound healing center program Goals: Patient/caregiver will verbalize understanding of the Wound Healing Center Program Date Initiated: 07/12/2016 Target Resolution Date: 07/27/2016 Goal Status: Active Interventions: Provide education on orientation to the wound center Notes: ` Pain, Acute or Chronic Nursing Diagnoses: Pain, acute or chronic: actual or potential Potential alteration in comfort, pain Goals: Patient/caregiver will verbalize adequate pain control between visits Date Initiated: 07/12/2016 Target Resolution  Date: 10/26/2016 Goal Status: Active Interventions: Assess comfort goal upon admission Complete pain assessment as per visit requirements Notes: ` Soft Tissue Infection Nursing Diagnoses: Impaired tissue integrity Knowledge deficit related to disease process and management Knowledge deficit related to home infection control: handwashing, handling of soiled dressings, supply storage Goals: Patient/caregiver will verbalize understanding of or measures to prevent  infection and contamination in the home setting Date Initiated: 07/12/2016 Target Resolution Date: 09/28/2016 Goal Status: Active MARGE, VANDERMEULEN ([id number]) Interventions: Assess signs and symptoms of infection every visit Notes: ` Wound/Skin Impairment Nursing Diagnoses: Impaired tissue integrity Knowledge deficit related to smoking impact on wound healing Knowledge deficit related to ulceration/compromised skin integrity Goals: Ulcer/skin breakdown will have a volume reduction of 80% by week 12 Date Initiated: 07/12/2016 Target Resolution Date: 10/26/2016 Goal Status: Active Interventions: Assess patient/caregiver ability to perform ulcer/skin care regimen upon admission and as needed Assess ulceration(s) every visit Provide education on smoking Notes: Electronic Signature(s) Signed: 08/30/2016 4:19:25 PM By: Elpidio Eric BSN, RN Entered By: Elpidio Eric on 08/30/2016 15:48:44 Elliff, Trenell J. ([id number]) -------------------------------------------------------------------------------- Pain Assessment Details Patient Name: [name]. Date of Service: 08/30/2016 3:30 PM Medical Record Number: [id number] Patient Account Number: [id number] Date of Birth/Sex: October 08, 1950 (66 y.o. Female) Treating RN: Afful, RN, BSN, [place] Sink Primary Care Mike Berntsen: Tonia Ghent Other Clinician: Referring Donathan Buller: Tonia Ghent Treating Jonathan Kirkendoll/Extender: Rudene Re in Treatment: 7 Active Problems Location of Pain Severity and Description of Pain Patient Has Paino Yes Site Locations Pain Location: Pain in Ulcers Rate the pain. Current Pain Level: 3 Character of Pain Describe the Pain: Tender Pain Management and Medication Current Pain Management: How does your wound impact your activities of daily livingo Sleep: Yes Bathing: Yes Appetite: Yes Relationship With Others: Yes Bladder Continence: Yes Emotions: Yes Bowel Continence: Yes Work: Yes Toileting: Yes Drive: Yes Dressing:  Yes Hobbies: Yes Electronic Signature(s) Signed: 08/30/2016 4:19:25 PM By: Elpidio Eric BSN, RN Entered By: Elpidio Eric on 08/30/2016 15:44:29 Mahan, [name] ([id number]) -------------------------------------------------------------------------------- Patient/Caregiver Education Details Patient Name: [name]. Date of Service: 08/30/2016 3:30 PM Medical Record Number: [id number] Patient Account Number: [id number] Date of Birth/Gender: September 26, 1950 (66 y.o. Female) Treating RN: Afful, RN, BSN, [place] Sink Primary Care Physician: Tonia Ghent Other Clinician: Referring Physician: Tonia Ghent Treating Physician/Extender: Rudene Re in Treatment: 7 Education Assessment Education Provided To: Patient Education Topics Provided Elevated Blood Sugar/ Impact on Healing: Methods: Explain/Verbal Responses: State content correctly Smoking and Wound Healing: Methods: Explain/Verbal Responses: State content correctly Welcome To The Wound Care Center: Methods: Explain/Verbal Responses: State content correctly Electronic Signature(s) Signed: 08/30/2016 4:19:25 PM By: Elpidio Eric BSN, RN Entered By: Elpidio Eric on 08/30/2016 15:59:45 Ortez, Faren J. ([id number]) -------------------------------------------------------------------------------- Wound Assessment Details Patient Name: [name]. Date of Service: 08/30/2016 3:30 PM Medical Record Number: [id number] Patient Account Number: [id number] Date of Birth/Sex: Jul 17, 1950 (66 y.o. Female) Treating RN: Afful, RN, BSN, [place] Sink Primary Care Naomi Castrogiovanni: Tonia Ghent Other Clinician: Referring Grant Henkes: Tonia Ghent Treating Edi Gorniak/Extender: Rudene Re in Treatment: 7 Wound Status Wound Number: 3 Primary Diabetic Wound/Ulcer of the Lower Etiology: Extremity Wound Location: Left Toe Great - Medial Secondary Infection - not elsewhere classified Wounding Event: Gradually Appeared Etiology: Date Acquired: 06/14/2016 Wound Open Weeks  Of Treatment: 7 Status: Clustered Wound: No Comorbid Asthma, Chronic Obstructive Pending Amputation On Presentation History: Pulmonary Disease (COPD), Congestive Heart Failure, Hypertension, Type II Diabetes, Osteoarthritis, Neuropathy Photos Photo Uploaded By: Elpidio Eric on 08/30/2016 16:26:18 Wound Measurements Length: (cm) 1.6 Width: (cm) 0.4  Depth: (cm) 0.2 Area: (cm) 0.503 Volume: (cm) 0.101 % Reduction in Area: 91.5% % Reduction in Volume: 82.9% Epithelialization: None Tunneling: No Undermining: No Wound Description Classification: Grade 1 Foul Odor Aft Wound Margin: Distinct, outline attached Slough/Fibrin Exudate Amount: Medium Exudate Type: Serous Exudate Color: amber er Cleansing: No o Yes Wound Bed Boutwell, Shameca J. ([id number]) Granulation Amount: None Present (0%) Exposed Structure Necrotic Amount: Large (67-100%) Fascia Exposed: No Necrotic Quality: Adherent Slough Fat Layer (Subcutaneous Tissue) Exposed: Yes Tendon Exposed: No Muscle Exposed: No Joint Exposed: No Bone Exposed: No Periwound Skin Texture Texture Color No Abnormalities Noted: No No Abnormalities Noted: No Callus: No Atrophie Blanche: No Crepitus: No Cyanosis: No Excoriation: No Ecchymosis: No Induration: No Erythema: No Rash: No Hemosiderin Staining: No Scarring: No Mottled: No Pallor: No Moisture Rubor: No No Abnormalities Noted: No Dry / Scaly: No Temperature / Pain Maceration: No Temperature: No Abnormality Tenderness on Palpation: Yes Wound Preparation Ulcer Cleansing: Rinsed/Irrigated with Saline Topical Anesthetic Applied: Other: lidocaine 4%, Treatment Notes Wound #3 (Left, Medial Toe Great) 1. Cleansed with: Clean wound with Normal Saline 4. Dressing Applied: Santyl Ointment 5. Secondary Dressing Applied Gauze and Kerlix/Conform 7. 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 Date of Service: 08/30/2016 3:30 PM Medical Record Number: [id number] Patient Account Number: [id number] Date of Birth/Sex: 1950/06/09 (66 y.o. Female) Treating RN: Afful, RN, BSN, [place] Sink Primary Care [place] Sites: Tonia Ghent Other Clinician: Referring Britaney Espaillat: Tonia Ghent Treating Bevelyn Arriola/Extender: Rudene Re in Treatment: 7 Vital Signs Time Taken: 15:44 Temperature (F): 98.0 Height (in): 63 Pulse (bpm): 80 Weight (lbs): 224 Respiratory Rate (breaths/min): 17 Body Mass Index (BMI): 39.7 Blood Pressure (mmHg): 146/83 Reference Range: 80 - 120 mg / dl Electronic Signature(s) Signed: 08/30/2016 4:19:25 PM By: Elpidio Eric BSN, RN Entered By: Elpidio Eric on 08/30/2016 15:44:54

## 2016-09-06 ENCOUNTER — Encounter: Payer: Medicare HMO | Admitting: Surgery

## 2016-09-06 DIAGNOSIS — E11621 Type 2 diabetes mellitus with foot ulcer: Secondary | ICD-10-CM | POA: Diagnosis not present

## 2016-09-08 NOTE — Progress Notes (Signed)
Kathleen, [name] ([id number]) Visit Report for 09/06/2016 Arrival Information Details Patient Name: [name], [name]. Date of Service: 09/06/2016 3:30 PM Medical Record Number: [id number] Patient Account Number: [id number] Date of Birth/Sex: Feb 14, 1951 (66 y.o. Female) Treating RN: Afful, RN, BSN, [place] Sink Primary Care Jarius Dieudonne: Tonia Ghent Other Clinician: Referring Kimberlye Dilger: Tonia Ghent Treating Juletta Berhe/Extender: Rudene Re in Treatment: 8 Visit Information History Since Last Visit All ordered tests and consults were completed: No Patient Arrived: Gilmer Mor Added or deleted any medications: No Arrival Time: 15:38 Any new allergies or adverse reactions: No Accompanied By: self Had a fall or experienced change in No Transfer Assistance: None activities of daily living that may affect Patient Identification Verified: Yes risk of falls: Secondary Verification Process Completed: Yes Signs or symptoms of abuse/neglect since last No Patient Requires Transmission-Based No visito Precautions: Hospitalized since last visit: No Patient Has Alerts: Yes Has Dressing in Place as Prescribed: Yes Patient Alerts: Dm II Pain Present Now: Yes Electronic Signature(s) Signed: 09/06/2016 4:09:52 PM By: Elpidio Eric BSN, RN Entered By: Elpidio Eric on 09/06/2016 15:38:37 Lesesne, [name] ([id number]) -------------------------------------------------------------------------------- Encounter Discharge Information Details Patient Name: [name]. Date of Service: 09/06/2016 3:30 PM Medical Record Number: [id number] Patient Account Number: [id number] Date of Birth/Sex: 07-05-1950 (66 y.o. Female) Treating RN: Afful, RN, BSN, [place] Sink Primary Care Fareeda Downard: Tonia Ghent Other Clinician: Referring Treysen Sudbeck: Tonia Ghent Treating Felma Pfefferle/Extender: Rudene Re in Treatment: 8 Encounter Discharge Information Items Discharge Pain Level: 0 Discharge Condition: Stable Ambulatory Status:  Cane Discharge Destination: Home Transportation: Private Auto Accompanied By: self Schedule Follow-up Appointment: No Medication Reconciliation completed No and provided to Patient/Care Robyn Nohr: Provided on Clinical Summary of Care: 09/06/2016 Form Type Recipient Paper Patient EP Electronic Signature(s) Signed: 09/06/2016 4:09:52 PM By: Elpidio Eric BSN, RN Previous Signature: 09/06/2016 3:52:24 PM Version By: Gwenlyn Perking Entered By: Elpidio Eric on 09/06/2016 15:55:16 Roop, [name] ([id number]) -------------------------------------------------------------------------------- Lower Extremity Assessment Details Patient Name: [name]. Date of Service: 09/06/2016 3:30 PM Medical Record Number: [id number] Patient Account Number: [id number] Date of Birth/Sex: Dec 22, 1950 (66 y.o. Female) Treating RN: Afful, RN, BSN, [place] Sink Primary Care Zianne Schubring: Tonia Ghent Other Clinician: Referring Kimeka Badour: Tonia Ghent Treating Jahaira Earnhart/Extender: Rudene Re in Treatment: 8 Vascular Assessment Claudication: Claudication Assessment [Left:None] Pulses: Dorsalis Pedis Palpable: [Left:Yes] Posterior Tibial Extremity colors, hair growth, and conditions: Extremity Color: [Left:Normal] Hair Growth on Extremity: [Left:No] Temperature of Extremity: [Left:Warm] Capillary Refill: [Left:< 3 seconds] Toe Nail Assessment Left: Right: Thick: Yes Discolored: Yes Deformed: No Improper Length and Hygiene: Yes Electronic Signature(s) Signed: 09/06/2016 4:09:52 PM By: Elpidio Eric BSN, RN Entered By: Elpidio Eric on 09/06/2016 15:46:06 Flaming, [name] ([id number]) -------------------------------------------------------------------------------- Multi Wound Chart Details Patient Name: [name]. Date of Service: 09/06/2016 3:30 PM Medical Record Number: [id number] Patient Account Number: [id number] Date of Birth/Sex: [birth year] (66 y.o. Female) Treating RN: Afful, RN, BSN, [place] Sink Primary Care  Eddy Liszewski: Tonia Ghent Other Clinician: Referring Kolby Myung: Tonia Ghent Treating Lyrical Sowle/Extender: Rudene Re in Treatment: 8 Vital Signs Height(in): 63 Pulse(bpm): 89 Weight(lbs): 224 Blood Pressure 136/73 (mmHg): Body Mass Index(BMI): 40 Temperature(F): 98.4 Respiratory Rate 17 (breaths/min): Photos: [3:No Photos] [N/A:N/A] Wound Location: [3:Left Toe Great - Medial] [N/A:N/A] Wounding Event: [3:Gradually Appeared] [N/A:N/A] Primary Etiology: [3:Diabetic Wound/Ulcer of the Lower Extremity] [N/A:N/A] Secondary Etiology: [3:Infection - not elsewhere classified] [N/A:N/A] Comorbid History: [3:Asthma, Chronic Obstructive Pulmonary Disease (COPD), Congestive Heart Failure, Hypertension, Type II Diabetes, Osteoarthritis, Neuropathy] [N/A:N/A] Date Acquired: [3:06/14/2016] [N/A:N/A] Weeks of Treatment: [3:8] [N/A:N/A] Wound Status: [3:Open] [N/A:N/A] Pending  Amputation on Yes [N/A:N/A] Presentation: Measurements L x W x D 1.6x0.4x0.2 [N/A:N/A] (cm) Area (cm) : [3:0.503] [N/A:N/A] Volume (cm) : [3:0.101] [N/A:N/A] % Reduction in Area: [3:91.50%] [N/A:N/A] % Reduction in Volume: 82.90% [N/A:N/A] Classification: [3:Grade 1] [N/A:N/A] Exudate Amount: [3:Medium] [N/A:N/A] Exudate Type: [3:Serous] [N/A:N/A] Exudate Color: [3:amber] [N/A:N/A] Wound Margin: [3:Distinct, outline attached] [N/A:N/A] Granulation Amount: None Present (0%) N/A N/A Necrotic Amount: Large (67-100%) N/A N/A Exposed Structures: Fat Layer (Subcutaneous N/A N/A Tissue) Exposed: Yes Fascia: No Tendon: No Muscle: No Joint: No Bone: No Epithelialization: None N/A N/A Debridement: Debridement (29562- N/A N/A 11047) Pre-procedure 15:46 N/A N/A Verification/Time Out Taken: Pain Control: Lidocaine 4% Topical N/A N/A Solution Tissue Debrided: Fibrin/Slough N/A N/A Level: Skin/Subcutaneous N/A N/A Tissue Debridement Area (sq 0.64 N/A N/A cm): Instrument: Curette N/A N/A Bleeding: Minimum  N/A N/A Hemostasis Achieved: Pressure N/A N/A Procedural Pain: 0 N/A N/A Post Procedural Pain: 0 N/A N/A Debridement Treatment Procedure was tolerated N/A N/A Response: well Post Debridement 1.6x0.4x0.2 N/A N/A Measurements L x W x D (cm) Post Debridement 0.101 N/A N/A Volume: (cm) Periwound Skin Texture: Excoriation: No N/A N/A Induration: No Callus: No Crepitus: No Rash: No Scarring: No Periwound Skin Maceration: Yes N/A N/A Moisture: Dry/Scaly: No Periwound Skin Color: Atrophie Blanche: No N/A N/A Cyanosis: No Ecchymosis: No Erythema: No Hemosiderin Staining: No Mottled: No Pallor: No Rubor: No Lacivita, Lisa J. ([id number]) Temperature: No Abnormality N/A N/A Tenderness on Yes N/A N/A Palpation: Wound Preparation: Ulcer Cleansing: N/A N/A Rinsed/Irrigated with Saline Topical Anesthetic Applied: Other: lidocaine 4% Procedures Performed: Debridement N/A N/A Treatment Notes Wound #3 (Left, Medial Toe Great) 1. Cleansed with: Clean wound with Normal Saline 4. Dressing Applied: Santyl Ointment 5. Secondary Dressing Applied Dry Gauze Kerlix/Conform 7. 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 Date of Service: 09/06/2016 3:30 PM Medical Record Number: [id number] Patient Account Number: [id number] Date of Birth/Sex: [birth year] (66 y.o. Female) Treating RN: Afful, RN, BSN, [employer] Primary Care Archie Shea: Tonia Ghent Other Clinician: Referring Alonso Gapinski: Tonia Ghent Treating Chandni Gagan/Extender: Rudene Re in Treatment: 8 Active Inactive ` Abuse / Safety / Falls / Self Care Management Nursing Diagnoses: Potential for falls Goals: Patient will remain injury  free Date Initiated: 07/12/2016 Target Resolution Date: 10/26/2016 Goal Status: Active Interventions: Assess fall risk on admission and as needed Assess impairment of mobility on admission and as needed per policy Notes: ` Nutrition Nursing Diagnoses: Imbalanced nutrition Impaired glucose control: actual or potential Potential for alteratiion in Nutrition/Potential for imbalanced nutrition Goals: Patient/caregiver agrees to and verbalizes understanding of need to use nutritional supplements and/or vitamins as prescribed Date Initiated: 07/12/2016 Target Resolution Date: 09/28/2016 Goal Status: Active Interventions: Assess patient nutrition upon admission and as needed per policy Provide education on elevated blood sugars and impact on wound healing Notes: LANORE, [name] ([id number]) Orientation to the Wound Care Program Nursing Diagnoses: Knowledge deficit related to the wound healing center program Goals: Patient/caregiver will verbalize understanding of the Wound Healing Center Program Date Initiated: 07/12/2016 Target Resolution Date: 07/27/2016 Goal Status: Active Interventions: Provide education on orientation to the wound center Notes: ` Pain, Acute or Chronic Nursing Diagnoses: Pain, acute or chronic: actual or potential Potential alteration in comfort, pain Goals: Patient/caregiver will verbalize adequate pain control between visits Date Initiated: 07/12/2016 Target Resolution Date: 10/26/2016 Goal Status: Active Interventions:  Assess comfort goal upon admission Complete pain assessment as per visit requirements Notes: ` Soft Tissue Infection Nursing Diagnoses: Impaired tissue integrity Knowledge deficit related to disease process and management Knowledge deficit related to home infection control: handwashing, handling of soiled dressings, supply storage Goals: Patient/caregiver will verbalize understanding of or measures to prevent infection and contamination  in the home setting Date Initiated: 07/12/2016 Target Resolution Date: 09/28/2016 Goal Status: Active BRENIYA, GOERTZEN ([id number]) Interventions: Assess signs and symptoms of infection every visit Notes: ` Wound/Skin Impairment Nursing Diagnoses: Impaired tissue integrity Knowledge deficit related to smoking impact on wound healing Knowledge deficit related to ulceration/compromised skin integrity Goals: Ulcer/skin breakdown will have a volume reduction of 80% by week 12 Date Initiated: 07/12/2016 Target Resolution Date: 10/26/2016 Goal Status: Active Interventions: Assess patient/caregiver ability to perform ulcer/skin care regimen upon admission and as needed Assess ulceration(s) every visit Provide education on smoking Notes: Electronic Signature(s) Signed: 09/06/2016 4:09:52 PM By: Elpidio Eric BSN, RN Entered By: Elpidio Eric on 09/06/2016 15:46:33 Hochmuth, [name] ([id number]) -------------------------------------------------------------------------------- Pain Assessment Details Patient Name: [name]. Date of Service: 09/06/2016 3:30 PM Medical Record Number: [id number] Patient Account Number: [id number] Date of Birth/Sex: 08-Apr-1950 (66 y.o. Female) Treating RN: Afful, RN, BSN, [place] Sink Primary Care Dorena Dorfman: Tonia Ghent Other Clinician: Referring Carvel Huskins: Tonia Ghent Treating Precilla Purnell/Extender: Rudene Re in Treatment: 8 Active Problems Location of Pain Severity and Description of Pain Patient Has Paino Yes Site Locations Pain Location: Pain in Ulcers Rate the pain. Current Pain Level: 3 Character of Pain Describe the Pain: Aching, Tender Pain Management and Medication Current Pain Management: How does your wound impact your activities of daily livingo Sleep: Yes Bathing: Yes Appetite: Yes Relationship With Others: Yes Bladder Continence: Yes Emotions: Yes Bowel Continence: Yes Work: Yes Toileting: Yes Drive: Yes Dressing: Yes Hobbies:  Yes Electronic Signature(s) Signed: 09/06/2016 4:09:52 PM By: Elpidio Eric BSN, RN Entered By: Elpidio Eric on 09/06/2016 15:46:23 Lezcano, [name] ([id number]) -------------------------------------------------------------------------------- Patient/Caregiver Education Details Patient Name: [name] Date of Service: 09/06/2016 3:30 PM Medical Record Number: [id number] Patient Account Number: [id number] Date of Birth/Gender: [birth year] (66 y.o. Female) Treating RN: Afful, RN, BSN, [place] Sink Primary Care Physician: Tonia Ghent Other Clinician: Referring Physician: Tonia Ghent Treating Physician/Extender: Rudene Re in Treatment: 8 Education Assessment Education Provided To: Patient Education Topics Provided Elevated Blood Sugar/ Impact on Healing: Methods: Explain/Verbal Responses: State content correctly Smoking and Wound Healing: Methods: Explain/Verbal Responses: State content correctly Welcome To The Wound Care Center: Methods: Explain/Verbal Responses: State content correctly Wound Debridement: Methods: Explain/Verbal Wound/Skin Impairment: Methods: Explain/Verbal Responses: State content correctly Electronic Signature(s) Signed: 09/06/2016 4:09:52 PM By: Elpidio Eric BSN, RN Entered By: Elpidio Eric on 09/06/2016 15:55:39 Strum, [name] ([id number]) -------------------------------------------------------------------------------- Wound Assessment Details Patient Name: [name]. Date of Service: 09/06/2016 3:30 PM Medical Record Number: [id number] Patient Account Number: [id number] Date of Birth/Sex: 08-Mar-1951 (66 y.o. Female) Treating RN: Afful, RN, BSN, [place] Sink Primary Care Nicolasa Milbrath: Tonia Ghent Other Clinician: Referring Candie Gintz: Tonia Ghent Treating Toree Edling/Extender: Rudene Re in Treatment: 8 Wound Status Wound Number: 3 Primary Diabetic Wound/Ulcer of the Lower Etiology: Extremity Wound Location: Left Toe Great - Medial Secondary  Infection - not elsewhere classified Wounding Event: Gradually Appeared Etiology: Date Acquired: 06/14/2016 Wound Open Weeks Of Treatment: 8 Status: Clustered Wound: No Comorbid Asthma, Chronic Obstructive Pending Amputation On Presentation History: Pulmonary Disease (COPD), Congestive Heart Failure, Hypertension, Type II Diabetes, Osteoarthritis, Neuropathy Photos Photo Uploaded By: Elpidio Eric on 09/06/2016 16:09:15 Wound  Measurements Length: (cm) 1.6 Width: (cm) 0.4 Depth: (cm) 0.2 Area: (cm) 0.503 Volume: (cm) 0.101 % Reduction in Area: 91.5% % Reduction in Volume: 82.9% Epithelialization: None Tunneling: No Undermining: No Wound Description Classification: Grade 1 Foul Odor Aft Wound Margin: Distinct, outline attached Slough/Fibrin Exudate Amount: Medium Exudate Type: Serous Exudate Color: amber er Cleansing: No o Yes Wound Bed Wehner, Vandora J. ([id number]) Granulation Amount: None Present (0%) Exposed Structure Necrotic Amount: Large (67-100%) Fascia Exposed: No Necrotic Quality: Adherent Slough Fat Layer (Subcutaneous Tissue) Exposed: Yes Tendon Exposed: No Muscle Exposed: No Joint Exposed: No Bone Exposed: No Periwound Skin Texture Texture Color No Abnormalities Noted: No No Abnormalities Noted: No Callus: No Atrophie Blanche: No Crepitus: No Cyanosis: No Excoriation: No Ecchymosis: No Induration: No Erythema: No Rash: No Hemosiderin Staining: No Scarring: No Mottled: No Pallor: No Moisture Rubor: No No Abnormalities Noted: No Dry / Scaly: No Temperature / Pain Maceration: Yes Temperature: No Abnormality Tenderness on Palpation: Yes Wound Preparation Ulcer Cleansing: Rinsed/Irrigated with Saline Topical Anesthetic Applied: Other: lidocaine 4%, Treatment Notes Wound #3 (Left, Medial Toe Great) 1. Cleansed with: Clean wound with Normal Saline 4. Dressing Applied: Santyl Ointment 5. Secondary Dressing Applied Dry  Gauze Kerlix/Conform 7. 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 ([id number]) -------------------------------------------------------------------------------- Vitals Details Patient Name: Kathleen PiggPARRIS, Maame J. Date of Service: 09/06/2016 3:30 PM Medical Record Number: [id number] Patient Account Number: [id number] Date of Birth/Sex: 06-15-1950 (66 y.o. Female) Treating RN: Afful, RN, BSN, [place] Primary Care Jossalin Chervenak: Tonia GhentBENDER, ABBY Other Clinician: Referring Carlo Guevarra: Tonia GhentBENDER, ABBY Treating Titilayo Hagans/Extender: Rudene ReBritto, Errol Weeks in Treatment: 8 Vital Signs Time Taken: 15:38 Temperature (F): 98.4 Height (in): 63 Pulse (bpm): 89 Weight (lbs): 224 Respiratory Rate (breaths/min): 17 Body Mass Index (BMI): 39.7 Blood Pressure (mmHg): 136/73 Reference Range: 80 - 120 mg / dl Electronic Signature(s) Signed: 09/06/2016 4:09:52 PM By: Elpidio EricAfful, Rita BSN, RN Entered By: Elpidio EricAfful, Rita on 09/06/2016 15:38:58

## 2016-09-08 NOTE — Progress Notes (Addendum)
Kathleen [name], Kathleen [name] ([id number]) Visit Report for 09/06/2016 Chief Complaint Document Details Patient Name: Kathleen [name], Kathleen [name]. Date of Service: 09/06/2016 3:30 PM Medical Record Number: [id number] Patient Account Number: [id number] Date of Birth/Sex: 12/06/1950 (66 y.o. Female) Treating [name]: [name], [name], Kathleen [name], [place] Sink Primary Care Provider: Tonia Ghent Other Clinician: Referring Provider: Tonia Ghent Treating Provider/Extender: Rudene Re in Treatment: 8 Information Obtained from: Patient Chief Complaint Kathleen [name] presents today for evaluation of her left medial hallux and left lateral lower extremity ulcers Electronic Signature(s) Signed: 09/06/2016 4:05:03 PM By: Evlyn Kanner MD, FACS Entered By: Evlyn Kanner on 09/06/2016 16:05:03 Kathleen [name], Kathleen [name] ([id number]) -------------------------------------------------------------------------------- Debridement Details Patient Name: Kathleen [name]. Date of Service: 09/06/2016 3:30 PM Medical Record Number: [id number] Patient Account Number: [id number] Date of Birth/Sex: November 24, 1950 (66 y.o. Female) Treating [name]: [name], [name], Kathleen [name], [place] Sink Primary Care Provider: Tonia Ghent Other Clinician: Referring Provider: Tonia Ghent Treating Provider/Extender: Rudene Re in Treatment: 8 Debridement Performed for Wound #3 Left,Medial Toe Great Assessment: Performed By: Physician Evlyn Kanner, MD Debridement: Debridement Severity of Tissue Pre Fat layer exposed Debridement: Pre-procedure Verification/Time Out Yes - 15:46 Taken: Start Time: 15:46 Pain Control: Lidocaine 4% Topical Solution Level: Skin/Subcutaneous Tissue Total Area Debrided (L x 1.6 (cm) x 0.4 (cm) = 0.64 (cm) W): Tissue and other Non-Viable, Exudate, Fat, Fibrin/Slough, Subcutaneous material debrided: Instrument: Curette Bleeding: Minimum Hemostasis Achieved: Pressure End Time: 15:47 Procedural Pain: 0 Post Procedural Pain: 0 Response to Treatment: Procedure was  tolerated well Post Debridement Measurements of Total Wound Length: (cm) 1.6 Width: (cm) 0.4 Depth: (cm) 0.2 Volume: (cm) 0.101 Character of Wound/Ulcer Post Requires Further Debridement Debridement: Severity of Tissue Post Debridement: Fat layer exposed Post Procedure Diagnosis Same as Pre-procedure Electronic Signature(s) Signed: 09/18/2016 12:51:34 PM By: Elpidio Eric BSN, [name] Signed: 09/19/2016 7:57:16 AM By: Evlyn Kanner MD, FACS [name], Kathleen [name] ([id number]) Previous Signature: 09/06/2016 4:04:52 PM Version By: Evlyn Kanner MD, FACS Previous Signature: 09/06/2016 4:09:52 PM Version By: Elpidio Eric BSN, [name] Entered By: Elpidio Eric on 09/18/2016 12:51:34 Kathleen [name], Kathleen [name] ([id number]) -------------------------------------------------------------------------------- HPI Details Patient Name: Kathleen [name]. Date of Service: 09/06/2016 3:30 PM Medical Record Number: [id number] Patient Account Number: [id number] Date of Birth/Sex: 01/18/1951 (66 y.o. Female) Treating [name]: [name], [name], Kathleen [name], Psychologist, clinical Primary Care Provider: Tonia Ghent Other Clinician: Referring Provider: Tonia Ghent Treating Provider/Extender: Rudene Re in Treatment: 8 History of Present Illness Location: left medial hallux; LLE lateral Duration: left medial hallux-4 weeks; LLE lateral-2-1/2 weeks Timing: left medial hallux-pain with pressure/standing, ambulating, manipulation; LLE lateral-pain with manipulation Context: left medial hallux-originally was a crack in the skin; LLE lateral-traumatic injury with car door Modifying Factors: uncontrolled diabetes and smoking are exacerbating factors to wound healing HPI Description: 07/12/16- the patient arrives for initial evaluation of a left hallux and left lower extremity lateral ulcer. She states that the left hallux originally had a crack in the skin which she began to apply AandD ointment. She went to her PCP at [facility] on 4/4, while going into  her appointment she struck her left lateral leg on the car door. At that appointment she was given a prescription for Bactrim and completed that on 4/14 with no adverse reaction. She states that it made little to no improvement in her toe wound. She went back for a follow-up visit on 4/18 and was prescribed doxycycline for 10 days. She continues to apply AandD topical ointment along with soaking her foot in warm water. She is a diabetic, most recent  A1c of 9% (I do not have records of this, this is per patient report). She is both on oral agents and insulin. She is a current smoker, approximately 0.5 ppd. She denies any remote or recent evaluation regarding arterial or venous disease. 07/19/16 Patient presents today for follow-up evaluation concerning her left great toe wound immediately and left lateral lower extremity wound. Unfortunately though both wounds appear better visually she is having a lot of discomfort. I'm unsure if this is a preformed drop of the issue or potentially the ideas or appointment is causing irritation which has caused her discomfort and pain. The good news is her wound culture came back negative and showed only normal skin flora and her x-ray of the left foot was also negative for any osseous change or osteomyelitis. Unfortunately she continues to have a lot of discomfort she would rate it a seven out of 10 right now. 07/26/2016 -- she is doing much better since the Iodoflex was stopped and she is on Santyl. Her culture report was negative and only had normal skin flora. She continues to smoke about a half pack of cigarettes a day. 08/05/2016 -- she is doing much better with her smoking and is down to about 5 cigarettes a day. The left lower extremity wound is healed and she continues to have a lot of tenderness on her left big toe. In the course of conversation we realize she was using alcohol to clean her left big toe and we have told her to stop this Electronic  Signature(s) Signed: 09/06/2016 4:05:14 PM By: Evlyn Kanner MD, FACS Entered By: Evlyn Kanner on 09/06/2016 16:05:14 Rollyson, Kathleen [name] ([id number]) Maglione, Nanea Shela [name] ([id number]) -------------------------------------------------------------------------------- Physical Exam Details Patient Name: Kathleen [name], IVANCIC. Date of Service: 09/06/2016 3:30 PM Medical Record Number: [id number] Patient Account Number: [id number] Date of Birth/Sex: 1950-06-16 (66 y.o. Female) Treating [name]: [name], [name], Kathleen [name], [place] Sink Primary Care Provider: Tonia Ghent Other Clinician: Referring Provider: Tonia Ghent Treating Provider/Extender: Rudene Re in Treatment: 8 Constitutional . Pulse regular. Respirations normal and unlabored. Afebrile. . Eyes Nonicteric. Reactive to light. Ears, Nose, Mouth, and Throat Lips, teeth, and gums WNL.Marland Kitchen Moist mucosa without lesions. Neck supple and nontender. No palpable supraclavicular or cervical adenopathy. Normal sized without goiter. Respiratory WNL. No retractions.. Cardiovascular Pedal Pulses WNL. No clubbing, cyanosis or edema. Lymphatic No adneopathy. No adenopathy. No adenopathy. Musculoskeletal Adexa without tenderness or enlargement.. Digits and nails w/o clubbing, cyanosis, infection, petechiae, ischemia, or inflammatory conditions.. Integumentary (Hair, Skin) No suspicious lesions. No crepitus or fluctuance. No peri-wound warmth or erythema. No masses.Marland Kitchen Psychiatric Judgement and insight Intact.. No evidence of depression, anxiety, or agitation.. Notes sharp debridement was done and all the eschar and subcutaneous and his debris was removed and minimal bleeding controlled with pressure Electronic Signature(s) Signed: 09/06/2016 4:05:38 PM By: Evlyn Kanner MD, FACS Entered By: Evlyn Kanner on 09/06/2016 16:05:38 Langlois, Kathleen [name] ([id number]) -------------------------------------------------------------------------------- Physician Orders Details Patient Name:  Kathleen [name]. Date of Service: 09/06/2016 3:30 PM Medical Record Number: [id number] Patient Account Number: [id number] Date of Birth/Sex: 1950/11/13 (66 y.o. Female) Treating [name]: [name], [name], Kathleen [name], [place] Sink Primary Care Provider: Tonia Ghent Other Clinician: Referring Provider: Tonia Ghent Treating Provider/Extender: Rudene Re in Treatment: 8 Verbal / Phone Orders: No Diagnosis Coding Wound Cleansing Wound #3 Left,Medial Toe Great o Clean wound with Normal Saline. o Cleanse wound with mild soap and water Anesthetic Wound #3 Left,Medial Toe Great o Topical Lidocaine 4% cream applied to wound bed prior to debridement Primary  Wound Dressing Wound #3 Left,Medial Toe Great o Santyl Ointment Secondary Dressing Wound #3 Left,Medial Toe Great o Dry Gauze o Gauze and Kerlix/Conform o Other - tape Dressing Change Frequency Wound #3 Left,Medial Toe Great o Change dressing every day. o Other: - PRN Follow-up Appointments Wound #3 Left,Medial Toe Great o Return Appointment in 1 week. Edema Control Wound #3 Left,Medial Toe Great o Elevate legs to the level of the heart and pump ankles as often as possible Additional Orders / Instructions Wound #3 Left,Medial Toe Great o Stop Smoking Decola, Amandy J. ([id number]) o Increase protein intake. Medications-please add to medication list. Wound #3 Left,Medial Toe Great o Santyl Enzymatic Ointment Electronic Signature(s) Signed: 09/06/2016 4:09:52 PM By: Elpidio EricAfful, Rita BSN, [name] Signed: 09/06/2016 4:13:16 PM By: Evlyn KannerBritto, Dalesha Stanback MD, FACS Entered By: Elpidio EricAfful, Rita on 09/06/2016 15:52:00 Buhl, [name] ([id number]) -------------------------------------------------------------------------------- Problem List Details Patient Name: Kathleen PiggRRIS, Ardella J. Date of Service: 09/06/2016 3:30 PM Medical Record Number: [id number] Patient Account Number: [id number] Date of Birth/Sex: 05-18-1950 (66 y.o. Female) Treating [name]: [name], [name],  Kathleen [name], [place] Primary Care Provider: Tonia GhentBENDER, ABBY Other Clinician: Referring Provider: Tonia GhentBENDER, ABBY Treating Provider/Extender: Rudene ReBritto, Bookert Guzzi Weeks in Treatment: 8 Active Problems ICD-10 Encounter Code Description Active Date Diagnosis 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 2 diabetes mellitus with foot ulcer 07/12/2016 Yes E11.622 Type 2 diabetes mellitus with other skin ulcer 07/12/2016 Yes F17.218 Nicotine dependence, cigarettes, with other nicotine- 07/12/2016 Yes induced disorders 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 ([id number]) Elizondo, Santiana J. ([id number]) -------------------------------------------------------------------------------- Progress Note Details Patient Name: Kathleen PiggPARRIS, Caleyah J. Date of Service: 09/06/2016 3:30 PM Medical Record Number: [id number] Patient Account Number: [id number] Date of Birth/Sex: 05-18-1950 (66 y.o. Female) Treating [name]: [name], [name], Kathleen [name], Psychologist, clinicalita Primary Care Provider: Tonia GhentBENDER, ABBY Other Clinician: Referring Provider: Tonia GhentBENDER, ABBY Treating Provider/Extender: Rudene ReBritto, Lovie Zarling Weeks in Treatment: 8 Subjective Chief Complaint Information obtained from Patient Mrs. Kathleen [name] presents today for evaluation of her left medial hallux and left lateral lower extremity ulcers History of Present Illness (HPI) The following HPI elements were documented for the patient's wound: Location: left medial hallux; LLE lateral Duration: left medial hallux-4 weeks; LLE lateral-2-1/2 weeks Timing: left medial  hallux-pain with pressure/standing, ambulating, manipulation; LLE lateral-pain with manipulation Context: left medial hallux-originally was a crack in the skin; LLE lateral-traumatic injury with car door Modifying Factors: uncontrolled diabetes and smoking are exacerbating factors to wound healing 07/12/16- the patient arrives for initial evaluation of a left hallux and left lower extremity lateral ulcer. She states that the left hallux originally had a crack in the skin which she began to apply AandD ointment. [facility], while going into her appointment she struck her left lateral leg on the car door. At that appointment she was given a prescription for Bactrim and completed that on 4/14 with no adverse reaction. She states that it made little to no improvement in her toe wound. She went back for a follow-up visit on 4/18 and was prescribed doxycycline for 10 days. She continues to apply AandD topical ointment along with soaking her foot in  warm water. She is a diabetic, most recent A1c of 9% (I do not have records of this, this is per patient report). She is both on oral agents and insulin. She is a current smoker, approximately 0.5 ppd. She denies any remote or recent evaluation regarding arterial or venous disease. 07/19/16 Patient presents today for follow-up evaluation concerning her left great toe wound immediately and left lateral lower extremity wound. Unfortunately though both wounds appear better visually she is having a lot of discomfort. I'm unsure if this is a preformed drop of the issue or potentially the ideas or appointment is causing irritation which has caused her discomfort and pain. The good news is her wound culture came back negative and showed only normal skin flora and her x-ray of the left foot was also negative for any osseous change or osteomyelitis. Unfortunately she continues to have a lot of discomfort she would rate it  a seven out of 10 right now. 07/26/2016 -- she is doing much better since the Iodoflex was stopped and she is on Santyl. Her culture report was negative and only had normal skin flora. She continues to smoke about a half pack of cigarettes a day. 08/05/2016 -- she is doing much better with her smoking and is down to about 5 cigarettes a day. The left lower extremity wound is healed and she continues to have a lot of tenderness on her left big toe. In the Kathleen [name], Kathleen [name] ([id number]) course of conversation we realize she was using alcohol to clean her left big toe and we have told her to stop this Objective Constitutional Pulse regular. Respirations normal and unlabored. Afebrile. Vitals Time Taken: 3:38 PM, Height: 63 in, Weight: 224 lbs, BMI: 39.7, Temperature: 98.4 F, Pulse: 89 bpm, Respiratory Rate: 17 breaths/min, Blood Pressure: 136/73 mmHg. Eyes Nonicteric. Reactive to light. Ears, Nose, Mouth, and Throat Lips, teeth, and gums WNL.Marland Kitchen Moist mucosa without lesions. Neck supple and nontender. No palpable supraclavicular or cervical adenopathy. Normal sized without goiter. Respiratory WNL. No retractions.. Cardiovascular Pedal Pulses WNL. No clubbing, cyanosis or edema. Lymphatic No adneopathy. No adenopathy. No adenopathy. Musculoskeletal Adexa without tenderness or enlargement.. Digits and nails w/o clubbing, cyanosis, infection, petechiae, ischemia, or inflammatory conditions.Marland Kitchen Psychiatric Judgement and insight Intact.. No evidence of depression, anxiety, or agitation.. General Notes: sharp debridement was done and all the eschar and subcutaneous and his debris was removed and minimal bleeding controlled with pressure Integumentary (Hair, Skin) No suspicious lesions. No crepitus or fluctuance. No peri-wound warmth or erythema. No masses.. Wound #3 status is Open. Original cause of wound was Gradually Appeared. The wound is located on the [place], Kathleen [name].  ([id number]) Left,Medial Toe Great. The wound measures 1.6cm length x 0.4cm width x 0.2cm depth; 0.503cm^2 area and 0.101cm^3 volume. There is Fat Layer (Subcutaneous Tissue) Exposed exposed. There is no tunneling or undermining noted. There is a medium amount of serous drainage noted. The wound margin is distinct with the outline attached to the wound base. There is no granulation within the wound bed. There is a large (67-100%) amount of necrotic tissue within the wound bed including Adherent Slough. The periwound skin appearance exhibited: Maceration. The periwound skin appearance did not exhibit: Callus, Crepitus, Excoriation, Induration, Rash, Scarring, Dry/Scaly, Atrophie Blanche, Cyanosis, Ecchymosis, Hemosiderin Staining, Mottled, Pallor, Rubor, Erythema. Periwound temperature was noted as No Abnormality. The periwound has tenderness on palpation. Assessment Active Problems ICD-10 E11.621 - Type 2 diabetes mellitus with foot ulcer E11.622 - Type 2 diabetes mellitus with other  skin ulcer F17.218 - Nicotine dependence, cigarettes, with other nicotine-induced disorders L97.522 - Non-pressure chronic ulcer of other part of left foot with fat layer exposed Procedures Wound #3 Pre-procedure diagnosis of Wound #3 is a Diabetic Wound/Ulcer of the Lower Extremity located on the Left,Medial Toe Great .Severity of Tissue Pre Debridement is: Fat layer exposed. There was a Skin/Subcutaneous Tissue Debridement ([id number]) debridement with total area of 0.64 sq cm performed by Evlyn Kanner, MD. with the following instrument(s): Curette to remove Non-Viable tissue/material including Exudate, Fat Layer (and Subcutaneous Tissue) Exposed, Fibrin/Slough, and Subcutaneous after achieving pain control using Lidocaine 4% Topical Solution. A time out was conducted at 15:46, prior to the start of the procedure. A Minimum amount of bleeding was controlled with Pressure. The procedure was tolerated well  with a pain level of 0 throughout and a pain level of 0 following the procedure. Post Debridement Measurements: 1.6cm length x 0.4cm width x 0.2cm depth; 0.101cm^3 volume. Character of Wound/Ulcer Post Debridement requires further debridement. Severity of Tissue Post Debridement is: Fat layer exposed. Post procedure Diagnosis Wound #3: Same as Pre-Procedure Crall, Neil J. ([id number]) Plan Wound Cleansing: Wound #3 Left,Medial Toe Great: Clean wound with Normal Saline. Cleanse wound with mild soap and water Anesthetic: Wound #3 Left,Medial Toe Great: Topical Lidocaine 4% cream applied to wound bed prior to debridement Primary Wound Dressing: Wound #3 Left,Medial Toe Great: Santyl Ointment Secondary Dressing: Wound #3 Left,Medial Toe Great: Dry Gauze Gauze and Kerlix/Conform Other - tape Dressing Change Frequency: Wound #3 Left,Medial Toe Great: Change dressing every day. Other: - PRN Follow-up Appointments: Wound #3 Left,Medial Toe Great: Return Appointment in 1 week. Edema Control: Wound #3 Left,Medial Toe Great: Elevate legs to the level of the heart and pump ankles as often as possible Additional Orders / Instructions: Wound #3 Left,Medial Toe Great: Stop Smoking Increase protein intake. Medications-please add to medication list.: Wound #3 Left,Medial Toe Great: Santyl Enzymatic Ointment After sharp debridement today I have recommended: 1. Santyl ointment to be continued to the left big toe daily. 2. continue to work on stopping cigarette smoking 3. Adequate protein, vitamin A, vitamin Kathleen [name] and zinc 4. regular visits the wound center Electronic Signature(s) Kathleen [name], Kathleen [name] ([id number]) Signed: 09/20/2016 8:10:46 AM By: Kathleen [name], BSN, [name], Kathleen [name], Kathleen [name], Kathleen [name] Signed: 09/23/2016 7:58:58 AM By: Evlyn Kanner MD, FACS Previous Signature: 09/06/2016 4:06:09 PM Version By: Evlyn Kanner MD, FACS Entered By: Kathleen [name], BSN, [name], Kathleen [name], Kathleen on 09/20/2016 08:10:46 Cordner, Kathleen [name]  ([id number]) -------------------------------------------------------------------------------- SuperBill Details Patient Name: ZARYIA, MARKEL. Date of Service: 09/06/2016 Medical Record Number: [id number] Patient Account Number: [id number] Date of Birth/Sex: Apr 03, 1950 (66 y.o. 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 2 diabetes mellitus with foot ulcer E11.622 Type 2 diabetes mellitus with other skin ulcer F17.218 Nicotine dependence, cigarettes, with other nicotine-induced disorders L97.522 Non-pressure chronic ulcer of other part of left foot with fat layer exposed Facility Procedures CPT4 Code Description: [id number] 11042 - DEB SUBQ TISSUE 20 SQ CM/< ICD-10 Description Diagnosis 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 2 diabetes mellitus with foot ulcer E11.622 Type 2 diabetes mellitus with other skin ulcer L97.522 Non-pressure chronic ulcer of other part of  left foot Modifier: with fat lay Quantity: 1 er exposed Physician Procedures CPT4 Code Description: [id number] 11042 - WC PHYS SUBQ TISS 20 SQ CM ICD-10 Description Diagnosis 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 2 diabetes mellitus with  foot ulcer E11.622 Type 2 diabetes mellitus with other skin ulcer L97.522 Non-pressure chronic ulcer of other part of  left foot Modifier: with fat laye Quantity: 1 r exposed Electronic Signature(s) Signed: 09/06/2016 4:06:27 PM By: Evlyn Kanner MD, FACS Entered By: Evlyn Kanner on 09/06/2016 16:06:26

## 2016-09-13 ENCOUNTER — Ambulatory Visit: Payer: Medicare HMO | Admitting: Physician Assistant

## 2016-09-16 ENCOUNTER — Encounter: Payer: Medicare HMO | Admitting: Physician Assistant

## 2016-09-16 DIAGNOSIS — E11621 Type 2 diabetes mellitus with foot ulcer: Secondary | ICD-10-CM | POA: Diagnosis not present

## 2016-09-17 NOTE — Progress Notes (Signed)
JADEA, SHIFFER ([id number]) Visit Report for 09/16/2016 Chief Complaint Document Details Patient Name: [name], [name]. Date of Service: 09/16/2016 2:00 PM Medical Record Number: [id number] Patient Account Number: [id number] Date of Birth/Sex: 1950-08-23 (66 y.o. Female) Treating RN: Huel Coventry Primary Care Provider: Tonia Ghent Other Clinician: Referring Provider: Tonia Ghent Treating Provider/Extender: Linwood Dibbles, HOYT Weeks in Treatment: 9 Information Obtained from: Patient Chief Complaint [name] presents today for evaluation of her left medial hallux and left lateral lower extremity ulcers Electronic Signature(s) Signed: 09/16/2016 5:09:57 PM By: Lenda Kelp PA-C Entered By: Lenda Kelp on 09/16/2016 14:36:18 [name] ([id number]) -------------------------------------------------------------------------------- Debridement Details Patient Name: [name]. Date of Service: 09/16/2016 2:00 PM Medical Record Number: [id number] Patient Account Number: [id number] Date of Birth/Sex: June 06, 1950 (66 y.o. Female) Treating RN: Huel Coventry Primary Care Provider: Tonia Ghent Other Clinician: Referring Provider: Tonia Ghent Treating Provider/Extender: Linwood Dibbles, HOYT Weeks in Treatment: 9 Debridement Performed for Wound #3 Left,Medial Toe Great Assessment: Performed By: Physician STONE III, HOYT E., PA-C Debridement: Debridement Severity of Tissue Pre Fat layer exposed Debridement: Pre-procedure Verification/Time Out Yes - 14:25 Taken: Start Time: 14:26 Pain Control: Other : lidocaine 4% Level: Skin/Subcutaneous Tissue Total Area Debrided (L x 1 (cm) x 0.2 (cm) = 0.2 (cm) W): Tissue and other Viable, Non-Viable, Fibrin/Slough, Subcutaneous material debrided: Instrument: Curette Bleeding: Minimum Hemostasis Achieved: Pressure End Time: 14:30 Procedural Pain: 3 Post Procedural Pain: 3 Response to Treatment: Procedure was tolerated well Post  Debridement Measurements of Total Wound Length: (cm) 1 Width: (cm) 0.2 Depth: (cm) 0.3 Volume: (cm) 0.047 Character of Wound/Ulcer Post Improved Debridement: Severity of Tissue Post Debridement: Fat layer exposed Post Procedure Diagnosis Same as Pre-procedure Electronic Signature(s) Signed: 09/16/2016 4:17:42 PM By: Elliot Gurney, BSN, RN, CWS, [name], BSN Signed: 09/16/2016 5:09:57 PM By: Lenda Kelp PA-C Pedrosa, [name] ([id number]) Entered By: Elliot Gurney, BSN, RN, CWS, Kim on 09/16/2016 14:31:13 [name] ([id number]) -------------------------------------------------------------------------------- HPI Details Patient Name: [name], [name]. Date of Service: 09/16/2016 2:00 PM Medical Record Number: [id number] Patient Account Number: [id number] Date of Birth/Sex: 20-Feb-1951 (66 y.o. Female) Treating RN: Huel Coventry Primary Care Provider: Tonia Ghent Other Clinician: Referring Provider: Tonia Ghent Treating Provider/Extender: Linwood Dibbles, HOYT Weeks in Treatment: 9 History of Present Illness Location: left medial hallux; LLE lateral Duration: left medial hallux-4 weeks; LLE lateral-2-1/2 weeks Timing: left medial hallux-pain with pressure/standing, ambulating, manipulation; LLE lateral-pain with manipulation Context: left medial hallux-originally was a crack in the skin; LLE lateral-traumatic injury with car door Modifying Factors: uncontrolled diabetes and smoking are exacerbating factors to wound healing HPI Description: 07/12/16- the patient arrives for initial evaluation of a left hallux and left lower extremity lateral ulcer. She states that the left hallux originally had a crack in the skin which she began to apply AandD ointment. She went to her PCP at [facility] on 4/4, while going into her appointment she struck her left lateral leg on the car door. At that appointment she was given a prescription for Bactrim and completed that on 4/14 with no adverse  reaction. She states that it made little to no improvement in her toe wound. She went back for a follow-up visit on 4/18 and was prescribed doxycycline for 10 days. She continues to apply AandD topical ointment along with soaking her foot in warm water. She is a diabetic, most recent A1c of 9% (I do not have records of this, this is per patient report). She is both on  oral agents and insulin. She is a current smoker, approximately 0.5 ppd. She denies any remote or recent evaluation regarding arterial or venous disease. 07/19/16 Patient presents today for follow-up evaluation concerning her left great toe wound immediately and left lateral lower extremity wound. Unfortunately though both wounds appear better visually she is having a lot of discomfort. I'm unsure if this is a preformed drop of the issue or potentially the ideas or appointment is causing irritation which has caused her discomfort and pain. The good news is her wound culture came back negative and showed only normal skin flora and her x-ray of the left foot was also negative for any osseous change or osteomyelitis. Unfortunately she continues to have a lot of discomfort she would rate it a seven out of 10 right now. 07/26/2016 -- she is doing much better since the Iodoflex was stopped and she is on Santyl. Her culture report was negative and only had normal skin flora. She continues to smoke about a half pack of cigarettes a day. 08/05/2016 -- she is doing much better with her smoking and is down to about 5 cigarettes a day. The left lower extremity wound is healed and she continues to have a lot of tenderness on her left big toe. In the course of conversation we realize she was using alcohol to clean her left big toe and we have told her to stop this. 09/16/16 on evaluation today patient appears to be doing better in regard to her left first toe wound. With that being said she is having some discomfort at the proximal aspect which is  worrisome for potential infection although there is not a significant amount of erythema noted at this point. She states this is new and she has not had pain like this up to this point. Fortunately she has no red streaks upper leg and no evidence of more extensive cellulitis. The wound is also measuring smaller. KAILAN, CARMEN ([id number]) Electronic Signature(s) Signed: 09/16/2016 5:09:57 PM By: Lenda Kelp PA-C Entered By: Lenda Kelp on 09/16/2016 14:41:00 Maestas, [name] ([id number]) -------------------------------------------------------------------------------- Physical Exam Details Patient Name: [name], [name]. Date of Service: 09/16/2016 2:00 PM Medical Record Number: [id number] Patient Account Number: [id number] Date of Birth/Sex: [birth year] (66 y.o. Female) Treating RN: Huel Coventry Primary Care Provider: Tonia Ghent Other Clinician: Referring Provider: Tonia Ghent Treating Provider/Extender: STONE III, HOYT Weeks in Treatment: 9 Constitutional Obese and well-hydrated in no acute distress. Respiratory normal breathing without difficulty. Psychiatric this patient is able to make decisions and demonstrates good insight into disease process. Alert and Oriented x 3. pleasant and cooperative. Notes Patient does have Slough noted in the wound bed on evaluation today. She does tolerate the dressing changes well and did require sharp debridement on today's evaluation. She tolerated this okay although there was some discomfort that she has not previously noted. Nonetheless she was able to in the end tolerate debridement. Electronic Signature(s) Signed: 09/16/2016 5:09:57 PM By: Lenda Kelp PA-C Entered By: Lenda Kelp on 09/16/2016 14:41:45 Cuthbertson, [name] ([id number]) -------------------------------------------------------------------------------- Physician Orders Details Patient Name: [name], [name]. Date of Service: 09/16/2016 2:00 PM Medical Record Number:  [id number] Patient Account Number: [id number] Date of Birth/Sex: 12/01/1950 (66 y.o. Female) Treating RN: Huel Coventry Primary Care Provider: Tonia Ghent Other Clinician: Referring Provider: Tonia Ghent Treating Provider/Extender: Linwood Dibbles, HOYT Weeks in Treatment: 9 Verbal / Phone Orders: No Diagnosis Coding ICD-10 Coding Code Description 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 2 diabetes mellitus with foot ulcer E11.622 Type  2 diabetes mellitus with other skin ulcer F17.218 Nicotine dependence, cigarettes, with other nicotine-induced disorders L97.522 Non-pressure chronic ulcer of other part of left foot with fat layer exposed Wound Cleansing Wound #3 Left,Medial Toe Great o Clean wound with Normal Saline. o Cleanse wound with mild soap and water Anesthetic Wound #3 Left,Medial Toe Great o Topical Lidocaine 4% cream applied to wound bed prior to debridement Primary Wound Dressing Wound #3 Left,Medial Toe Great o Santyl Ointment Secondary Dressing Wound #3 Left,Medial Toe Great o Dry Gauze o Gauze and Kerlix/Conform o Other - tape Dressing Change Frequency Wound #3 Left,Medial Toe Great o Change dressing every day. Follow-up Appointments Wound #3 Left,Medial Toe Great o Return Appointment in 1 week. ANIDA, DEOL ([id number]) Edema Control Wound #3 Left,Medial Toe Great o Elevate legs to the level of the heart and pump ankles as often as possible Additional Orders / Instructions Wound #3 Left,Medial Toe Great o Stop Smoking o Increase protein intake. Medications-please add to medication list. Wound #3 Left,Medial Toe Great o Santyl Enzymatic Ointment - continue Notes We will continue with the Santyl for the next week. If anything worsens in the interim she will contact the office for additional recommendations. Otherwise I am going to place her on doxycycline to help prevent infection from worsening as I believe she may have the beginnings of infection in regard to  this right first toe. She is in agreement with this plan. Otherwise we will see her for reevaluation in one week. I do think that she needs a referral to podiatry for toenail trimming and this referral does need to come from her primary care provider. This is due to her insurance requirements. Electronic Signature(s) Signed: 09/16/2016 5:09:57 PM By: Lenda Kelp PA-C Entered By: Lenda Kelp on 09/16/2016 14:42:53 Mccandlish, [name] ([id number]) -------------------------------------------------------------------------------- Problem List Details Patient Name: [name], [name]. Date of Service: 09/16/2016 2:00 PM Medical Record Number: [id number] Patient Account Number: [id number] Date of Birth/Sex: 12-04-1950 (66 y.o. 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 ([id number]) Koper, Alexiah J. ([id number]) -------------------------------------------------------------------------------- Progress Note Details Patient Name:  [name]. Date of Service: 09/16/2016 2:00 PM Medical Record Number: [id number] Patient Account Number: [id number] Date of Birth/Sex: [birth year] (66 y.o. Female) Treating RN: Huel Coventry Primary Care Provider: Tonia Ghent Other Clinician: Referring Provider: Tonia Ghent Treating Provider/Extender: Linwood Dibbles, HOYT Weeks in Treatment: 9 Subjective Chief Complaint Information obtained from Patient Mrs. Abair presents today for evaluation of her left medial hallux and left lateral lower extremity ulcers History of Present Illness (HPI) The following HPI elements were documented for the patient's wound: Location: left medial hallux; LLE lateral Duration: left medial hallux-4 weeks; LLE lateral-2-1/2 weeks Timing:  left medial hallux-pain with pressure/standing, ambulating, manipulation; LLE lateral-pain with manipulation Context: left medial hallux-originally was a crack in the skin; LLE lateral-traumatic injury with car door Modifying Factors: uncontrolled diabetes and smoking are exacerbating factors to wound healing 07/12/16- the patient arrives for initial evaluation of a left hallux and left lower extremity lateral ulcer. She states that the left hallux originally had a crack in the skin which she began to apply AandD ointment. She went to her PCP at [facility] on 4/4, while going into her appointment she struck her left lateral leg on the car door. At that appointment she was given a prescription for Bactrim and completed that on 4/14 with no adverse reaction. She states that it made little to no improvement in her toe wound. She went back for a follow-up visit on 4/18 and was prescribed doxycycline for 10 days. She continues to apply AandD topical ointment along with soaking her foot in warm water. She is a diabetic, most recent A1c of 9% (I do not have records of this, this is per patient report). She is both on oral agents and insulin. She is a current smoker,  approximately 0.5 ppd. She denies any remote or recent evaluation regarding arterial or venous disease. 07/19/16 Patient presents today for follow-up evaluation concerning her left great toe wound immediately and left lateral lower extremity wound. Unfortunately though both wounds appear better visually she is having a lot of discomfort. I'm unsure if this is a preformed drop of the issue or potentially the ideas or appointment is causing irritation which has caused her discomfort and pain. The good news is her wound culture came back negative and showed only normal skin flora and her x-ray of the left foot was also negative for any osseous change or osteomyelitis. Unfortunately she continues to have a lot of discomfort she would rate it a seven out of 10 right now. 07/26/2016 -- she is doing much better since the Iodoflex was stopped and she is on Santyl. Her culture report was negative and only had normal skin flora. She continues to smoke about a half pack of cigarettes a day. 08/05/2016 -- she is doing much better with her smoking and is down to about 5 cigarettes a day. The left lower extremity wound is healed and she continues to have a lot of tenderness on her left big toe. In the [name], [name] course of conversation we realize she was using alcohol to clean her left big toe and we have told her to stop this. 09/16/16 on evaluation today patient appears to be doing better in regard to her left first toe wound. With that being said she is having some discomfort at the proximal aspect which is worrisome for potential infection although there is not a significant amount of erythema noted at this point. She states this is new and she has not had pain like this up to this point. Fortunately she has no red streaks upper leg and no evidence of more extensive cellulitis. The wound is also measuring smaller. Objective Constitutional Obese and well-hydrated in no acute  distress. Vitals Time Taken: 2:11 PM, Height: 63 in, Weight: 224 lbs, BMI: 39.7, Temperature: 98.7 F, Pulse: 91 bpm, Respiratory Rate: 16 breaths/min, Blood Pressure: 136/68 mmHg. Respiratory normal breathing without difficulty. Psychiatric this patient is able to make decisions and demonstrates good insight into disease process. Alert and Oriented x 3. pleasant and cooperative. General Notes: Patient does have Slough noted in the  wound bed on evaluation today. She does tolerate the dressing changes well and did require sharp debridement on today's evaluation. She tolerated this okay although there was some discomfort that she has not previously noted. Nonetheless she was able to in the end tolerate debridement. Integumentary (Hair, Skin) Wound #3 status is Open. Original cause of wound was Gradually Appeared. The wound is located on the RaytheonLeft,Medial Toe Great. The wound measures 1cm length x 0.2cm width x 0.2cm depth; 0.157cm^2 area and 0.031cm^3 volume. There is Fat Layer (Subcutaneous Tissue) Exposed exposed. There is no tunneling or undermining noted. There is a medium amount of serous drainage noted. The wound margin is distinct with the outline attached to the wound base. There is large (67-100%) granulation within the wound bed. There is no necrotic tissue within the wound bed. The periwound skin appearance exhibited: Callus, Dry/Scaly. The periwound skin appearance did not exhibit: Crepitus, Excoriation, Induration, Rash, Scarring, Maceration, Atrophie Blanche, Cyanosis, Ecchymosis, Hemosiderin Staining, Mottled, Pallor, Rubor, Erythema. Periwound temperature was noted as No Abnormality. The periwound has tenderness on [name], [name] ([id number]) palpation. 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 There was a Skin/Subcutaneous Tissue Debridement ([id number]) debridement with total area of 0.2 sq cm performed by STONE III, HOYT E., PA-C. with the following instrument(s): Curette to remove Viable and Non-Viable tissue/material including Fibrin/Slough and Subcutaneous after achieving pain control using Other (lidocaine 4%). A time out was conducted at 14:25, prior to the start of the procedure. A Minimum amount of bleeding was controlled with Pressure. The procedure was tolerated well with a pain level of 3 throughout and a pain level of 3 following the procedure. Post Debridement Measurements: 1cm length x 0.2cm width x 0.3cm depth; 0.047cm^3 volume. Character of Wound/Ulcer Post Debridement is improved. Severity of Tissue Post Debridement is: Fat layer exposed. Post procedure Diagnosis Wound #3: Same as Pre-Procedure Plan Wound Cleansing: Wound #3 Left,Medial Toe Great: Clean wound with Normal Saline. Cleanse wound with mild soap and water Anesthetic: Wound #3 Left,Medial Toe GreatJosepha [name]: Asman, Yatziry J. ([id number]) Topical Lidocaine 4% cream applied to wound bed prior to debridement Primary Wound Dressing: Wound #3 Left,Medial Toe Great: Santyl Ointment Secondary Dressing: Wound #3 Left,Medial Toe Great: Dry Gauze Gauze and Kerlix/Conform Other - tape Dressing Change Frequency: Wound #3 Left,Medial Toe Great: Change dressing every day. Follow-up Appointments: Wound #3 Left,Medial Toe Great: Return Appointment in 1 week. Edema Control: Wound #3 Left,Medial Toe Great: Elevate legs to the level of the heart and pump ankles as often as possible Additional Orders / Instructions: Wound  #3 Left,Medial Toe Great: Stop Smoking Increase protein intake. Medications-please add to medication list.: Wound #3 Left,Medial Toe Great: Santyl Enzymatic Ointment - continue General Notes: We will continue with the Santyl for the next week. If anything worsens in the interim she will contact the office for additional recommendations. Otherwise I am going to place her on doxycycline to help prevent infection from worsening as I believe she may have the beginnings of infection in regard to this right first toe. She is in agreement with this plan. Otherwise  we will see her for reevaluation in one week. I do think that she needs a referral to podiatry for toenail trimming and this referral does need to come from her primary care provider. This is due to her insurance requirements. Electronic Signature(s) Signed: 09/16/2016 5:09:57 PM By: Lenda Kelp PA-C Entered By: Lenda Kelp on 09/16/2016 14:43:22 Jacquot, [name] ([id number]) -------------------------------------------------------------------------------- SuperBill Details Patient Name: [name] Date of Service: 09/16/2016 Medical Record Number: [id number] Patient Account Number: [id number] Date of Birth/Sex: 1950-10-06 (66 y.o. Female) Treating RN: Huel Coventry Primary Care Provider: Tonia Ghent Other Clinician: Referring Provider: Tonia Ghent Treating Provider/Extender: Linwood Dibbles, HOYT Weeks in Treatment: 9 Diagnosis Coding ICD-10 Codes Code Description 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 2 diabetes mellitus with foot ulcer E11.622 Type 2 diabetes mellitus with other skin ulcer F17.218 Nicotine dependence, cigarettes, with other nicotine-induced disorders L97.522 Non-pressure chronic ulcer of other part of left foot with fat layer exposed Facility Procedures CPT4 Code Description: [id number] 11042 - DEB SUBQ TISSUE 20 SQ CM/< ICD-10 Description Diagnosis L97.522 Non-pressure chronic ulcer of other part of left foot Modifier: with fat  lay Quantity: 1 er exposed Physician Procedures CPT4 Code Description: [id number] 11042 - WC PHYS SUBQ TISS 20 SQ CM ICD-10 Description Diagnosis L97.522 Non-pressure chronic ulcer of other part of left foot Modifier: with fat laye Quantity: 1 r exposed Electronic Signature(s) Signed: 09/16/2016 5:09:57 PM By: Lenda Kelp PA-C Entered By: Lenda Kelp on 09/16/2016 14:43:37

## 2016-09-17 NOTE — Progress Notes (Signed)
[name], Arlicia J. ([id number]) Visit Report for 09/16/2016 Arrival Information Details Patient Name: [name], [name] Date of Service: 09/16/2016 2:00 PM Medical Record Number: [id number] Patient Account Number: [id number] Date of Birth/Sex: 1950/09/09 (66 y.o. Female) Treating RN: Huel CoventryWoody, Kim Primary Care Cally Nygard: Tonia GhentBENDER, ABBY Other Clinician: Referring Sindee Stucker: Tonia GhentBENDER, ABBY Treating Gaberiel Youngblood/Extender: Linwood DibblesSTONE III, HOYT Weeks in Treatment: 9 Visit Information History Since Last Visit Added or deleted any medications: No Patient Arrived: Cane Any new allergies or adverse reactions: No Arrival Time: 14:07 Had a fall or experienced change in No Accompanied By: self activities of daily living that may affect Transfer Assistance: None risk of falls: Patient Identification Verified: Yes Signs or symptoms of abuse/neglect No Secondary Verification Process Completed: Yes since last visito Patient Requires Transmission-Based No Hospitalized since last visit: No Precautions: Has Dressing in Place as Prescribed: Yes Patient Has Alerts: Yes Has Footwear/Offloading in Place as Yes Patient Alerts: Dm II Prescribed: Left: Surgical Shoe with Pressure Relief Insole Pain Present Now: Yes Electronic Signature(s) Signed: 09/16/2016 4:17:42 PM By: Elliot GurneyWoody, BSN, RN, CWS, [name], BSN Entered By: Elliot GurneyWoody, BSN, RN, CWS, Kim on 09/16/2016 14:07:43 [place], [name] ([id number]) -------------------------------------------------------------------------------- Encounter Discharge Information Details Patient Name: [name], [name] Date of Service: 09/16/2016 2:00 PM Medical Record Number: [id number] Patient Account Number: [id number] Date of Birth/Sex: 1950/09/09 (66 y.o. Female) Treating RN: Huel CoventryWoody, Kim Primary Care Angeletta Goelz: Tonia GhentBENDER, ABBY Other Clinician: Referring Orlanda Frankum: Tonia GhentBENDER, ABBY Treating Terina Mcelhinny/Extender: Linwood DibblesSTONE III, HOYT Weeks in Treatment: 9 Encounter Discharge Information Items Discharge Pain Level:  3 Discharge Condition: Stable Ambulatory Status: Cane Discharge Destination: Home Transportation: Private Auto Accompanied By: self Schedule Follow-up Appointment: Yes Medication Reconciliation completed Yes and provided to Patient/Care Vidalia Serpas: Provided on Clinical Summary of Care: 09/16/2016 Form Type Recipient Paper Patient EP Electronic Signature(s) Signed: 09/16/2016 2:39:37 PM By: Gwenlyn PerkingMoore, Shelia Entered By: Gwenlyn PerkingMoore, Shelia on 09/16/2016 14:39:37 Legore, Amairany J. ([id number]) -------------------------------------------------------------------------------- Lower Extremity Assessment Details Patient Name: [name], [name] Date of Service: 09/16/2016 2:00 PM Medical Record Number: [id number] Patient Account Number: [id number] Date of Birth/Sex: 1950/09/09 (66 y.o. Female) Treating RN: Huel CoventryWoody, Kim Primary Care Ventura Leggitt: Tonia GhentBENDER, ABBY Other Clinician: Referring Riniyah Speich: Tonia GhentBENDER, ABBY Treating Ashanti Ratti/Extender: Linwood DibblesSTONE III, HOYT Weeks in Treatment: 9 Edema Assessment Assessed: [Left: No] [Right: No] Edema: [Left: N] [Right: o] Vascular Assessment Pulses: Posterior Tibial Extremity colors, hair growth, and conditions: Extremity Color: [Left:Normal] Temperature of Extremity: [Left:Warm] Capillary Refill: [Left:< 3 seconds] Dependent Rubor: [Left:No] Blanched when Elevated: [Left:No] Lipodermatosclerosis: [Left:No] Toe Nail Assessment Left: Right: Thick: No Discolored: No Deformed: No Improper Length and Hygiene: No Electronic Signature(s) Signed: 09/16/2016 4:17:42 PM By: Elliot GurneyWoody, BSN, RN, CWS, [name], BSN Entered By: Elliot GurneyWoody, BSN, RN, CWS, Kim on 09/16/2016 14:15:33 Whiteaker, [name] ([id number]) -------------------------------------------------------------------------------- Multi Wound Chart Details Patient Name: [name], [name] Date of Service: 09/16/2016 2:00 PM Medical Record Number: [id number] Patient Account Number: [id number] Date of Birth/Sex: 1950/09/09 (66 y.o.  Female) Treating RN: Huel CoventryWoody, Kim Primary Care Gillis Boardley: Tonia GhentBENDER, ABBY Other Clinician: Referring Chandel Zaun: Tonia GhentBENDER, ABBY Treating Kayode Petion/Extender: Linwood DibblesSTONE III, HOYT Weeks in Treatment: 9 Vital Signs Height(in): 63 Pulse(bpm): 91 Weight(lbs): 224 Blood Pressure 136/68 (mmHg): Body Mass Index(BMI): 40 Temperature(F): 98.7 Respiratory Rate 16 (breaths/min): Photos: [N/A:N/A] Wound Location: Left Toe Great - Medial N/A N/A Wounding Event: Gradually Appeared N/A N/A Primary Etiology: Diabetic Wound/Ulcer of N/A N/A the Lower Extremity Secondary Etiology: Infection - not elsewhere N/A N/A classified Comorbid History: Asthma, Chronic N/A N/A Obstructive Pulmonary Disease (COPD), Congestive Heart Failure, Hypertension, Type II Diabetes, Osteoarthritis, Neuropathy  Date Acquired: 06/14/2016 N/A N/A Weeks of Treatment: 9 N/A N/A Wound Status: Open N/A N/A Pending Amputation on Yes N/A N/A Presentation: Measurements L x W x D 1x0.2x0.2 N/A N/A (cm) Area (cm) : 0.157 N/A N/A Volume (cm) : 0.031 N/A N/A % Reduction in Area: 97.30% N/A N/A % Reduction in Volume: 94.70% N/A N/A Classification: Grade 1 N/A N/A Desrosiers, [name] ([id number]) Exudate Amount: Medium N/A N/A Exudate Type: Serous N/A N/A Exudate Color: amber N/A N/A Wound Margin: Distinct, outline attached N/A N/A Granulation Amount: Large (67-100%) N/A N/A Necrotic Amount: None Present (0%) N/A N/A Exposed Structures: Fat Layer (Subcutaneous N/A N/A Tissue) Exposed: Yes Fascia: No Tendon: No Muscle: No Joint: No Bone: No Epithelialization: Large (67-100%) N/A N/A Periwound Skin Texture: Callus: Yes N/A N/A Excoriation: No Induration: No Crepitus: No Rash: No Scarring: No Periwound Skin Dry/Scaly: Yes N/A N/A Moisture: Maceration: No Periwound Skin Color: Atrophie Blanche: No N/A N/A Cyanosis: No Ecchymosis: No Erythema: No Hemosiderin Staining: No Mottled: No Pallor: No Rubor: No Temperature: No  Abnormality N/A N/A Tenderness on Yes N/A N/A Palpation: Wound Preparation: Ulcer Cleansing: N/A N/A Rinsed/Irrigated with Saline Topical Anesthetic Applied: Other: lidocaine 4% Treatment Notes Electronic Signature(s) Signed: 09/16/2016 4:17:42 PM By: Elliot Gurney, BSN, RN, CWS, [name], BSN Entered By: Elliot Gurney, BSN, RN, CWS, Kim on 09/16/2016 14:15:52 Kwong, [name] ([id number]) -------------------------------------------------------------------------------- Multi-Disciplinary Care Plan Details Patient Name: [name], [name]. Date of Service: 09/16/2016 2:00 PM Medical Record Number: [id number] Patient Account Number: [id number] Date of Birth/Sex: 1950-04-07 (66 y.o. Female) Treating RN: Huel Coventry Primary Care Kisa Fujii: Tonia Ghent Other Clinician: Referring Tony Granquist: Tonia Ghent Treating Gonzalo Waymire/Extender: Linwood Dibbles, HOYT Weeks in Treatment: 9 Active Inactive ` Abuse / Safety / Falls / Self Care Management Nursing Diagnoses: Potential for falls Goals: Patient will remain injury free Date Initiated: 07/12/2016 Target Resolution Date: 10/26/2016 Goal Status: Active Interventions: Assess fall risk on admission and as needed Assess impairment of mobility on admission and as needed per policy Notes: ` Nutrition Nursing Diagnoses: Imbalanced nutrition Impaired glucose control: actual or potential Potential for alteratiion in Nutrition/Potential for imbalanced nutrition Goals: Patient/caregiver agrees to and verbalizes understanding of need to use nutritional supplements and/or vitamins as prescribed Date Initiated: 07/12/2016 Target Resolution Date: 09/28/2016 Goal Status: Active Interventions: Assess patient nutrition upon admission and as needed per policy Provide education on elevated blood sugars and impact on wound healing Notes: [name], [name] ([id number]) Orientation to the Wound Care Program Nursing Diagnoses: Knowledge deficit related to the wound healing center  program Goals: Patient/caregiver will verbalize understanding of the Wound Healing Center Program Date Initiated: 07/12/2016 Target Resolution Date: 07/27/2016 Goal Status: Active Interventions: Provide education on orientation to the wound center Notes: ` Pain, Acute or Chronic Nursing Diagnoses: Pain, acute or chronic: actual or potential Potential alteration in comfort, pain Goals: Patient/caregiver will verbalize adequate pain control between visits Date Initiated: 07/12/2016 Target Resolution Date: 10/26/2016 Goal Status: Active Interventions: Assess comfort goal upon admission Complete pain assessment as per visit requirements Notes: ` Soft Tissue Infection Nursing Diagnoses: Impaired tissue integrity Knowledge deficit related to disease process and management Knowledge deficit related to home infection control: handwashing, handling of soiled dressings, supply storage Goals: Patient/caregiver will verbalize understanding of or measures to prevent infection and contamination in the home setting Date Initiated: 07/12/2016 Target Resolution Date: 09/28/2016 Goal Status: Active [name], [name] ([id number]) Interventions: Assess signs and symptoms of infection every visit Notes: ` Wound/Skin Impairment Nursing Diagnoses: Impaired tissue integrity Knowledge  deficit related to smoking impact on wound healing Knowledge deficit related to ulceration/compromised skin integrity Goals: Ulcer/skin breakdown will have a volume reduction of 80% by week 12 Date Initiated: 07/12/2016 Target Resolution Date: 10/26/2016 Goal Status: Active Interventions: Assess patient/caregiver ability to perform ulcer/skin care regimen upon admission and as needed Assess ulceration(s) every visit Provide education on smoking Notes: Electronic Signature(s) Signed: 09/16/2016 4:17:42 PM By: Elliot Gurney, BSN, RN, CWS, [name], BSN Entered By: Elliot Gurney, BSN, RN, CWS, Kim on 09/16/2016 14:15:41 Kraker, [name]  ([id number]) -------------------------------------------------------------------------------- Pain Assessment Details Patient Name: [name], [name]. Date of Service: 09/16/2016 2:00 PM Medical Record Number: [id number] Patient Account Number: [id number] Date of Birth/Sex: Oct 19, 1950 (66 y.o. Female) Treating RN: Huel Coventry Primary Care Lauri Purdum: Tonia Ghent Other Clinician: Referring Shahram Alexopoulos: Tonia Ghent Treating Louis Ivery/Extender: Linwood Dibbles, HOYT Weeks in Treatment: 9 Active Problems Location of Pain Severity and Description of Pain Patient Has Paino Yes Site Locations Pain Location: Pain in Ulcers Duration of the Pain. Constant / Intermittento Intermittent Rate the pain. Current Pain Level: 7 Character of Pain Describe the Pain: Burning, Tender Pain Management and Medication Current Pain Management: Goals for Pain Management Topical or injectable lidocaine is offered to patient for acute pain when surgical debridement is performed. If needed, Patient is instructed to use over the counter pain medication for the following 24-48 hours after debridement. Wound care MDs do not prescribed pain medications. Patient has chronic pain or uncontrolled pain. Patient has been instructed to make an appointment with their Primary Care Physician for pain management. Electronic Signature(s) Signed: 09/16/2016 4:17:42 PM By: Elliot Gurney, BSN, RN, CWS, [name], BSN Entered By: Elliot Gurney, BSN, RN, CWS, Kim on 09/16/2016 14:08:06 Deanda, [name] ([id number]) -------------------------------------------------------------------------------- Patient/Caregiver Education Details Patient Name: [name], [name]. Date of Service: 09/16/2016 2:00 PM Medical Record Number: [id number] Patient Account Number: [id number] Date of Birth/Gender: 1950/12/21 (66 y.o. Female) Treating RN: Huel Coventry Primary Care Physician: Tonia Ghent Other Clinician: Referring Physician: Tonia Ghent Treating Physician/Extender: Skeet Simmer in Treatment: 9 Education Assessment Education Provided To: Patient Education Topics Provided Elevated Blood Sugar/ Impact on Healing: Elevated Blood Sugars: How Do They Affect Wound Healing, Other: check blood sugars this Handouts: week Methods: Explain/Verbal Responses: State content correctly Wound/Skin Impairment: Handouts: Caring for Your Ulcer, Other: daily dressing changes as prescribed Methods: Demonstration, Explain/Verbal Responses: State content correctly Electronic Signature(s) Signed: 09/16/2016 4:17:42 PM By: Elliot Gurney, BSN, RN, CWS, [name], BSN Entered By: Elliot Gurney, BSN, RN, CWS, Kim on 09/16/2016 14:39:47 [name], [name] ([id number]) -------------------------------------------------------------------------------- Wound Assessment Details Patient Name: [name], MAESTRE. Date of Service: 09/16/2016 2:00 PM Medical Record Number: [id number] Patient Account Number: [id number] Date of Birth/Sex: March 07, 1951 (66 y.o. Female) Treating RN: Huel Coventry Primary Care Blayn Whetsell: Tonia Ghent Other Clinician: Referring Keeon Zurn: Tonia Ghent Treating Kaisy Severino/Extender: Linwood Dibbles, HOYT Weeks in Treatment: 9 Wound Status Wound Number: 3 Primary Diabetic Wound/Ulcer of the Lower Etiology: Extremity Wound Location: Left Toe Great - Medial Secondary Infection - not elsewhere classified Wounding Event: Gradually Appeared Etiology: Date Acquired: 06/14/2016 Wound Open Weeks Of Treatment: 9 Status: Clustered Wound: No Comorbid Asthma, Chronic Obstructive Pending Amputation On Presentation History: Pulmonary Disease (COPD), Congestive Heart Failure, Hypertension, Type II Diabetes, Osteoarthritis, Neuropathy Photos Wound Measurements Length: (cm) 1 Width: (cm) 0.2 Depth: (cm) 0.2 Area: (cm) 0.157 Volume: (cm) 0.031 % Reduction in Area: 97.3% % Reduction in Volume: 94.7% Epithelialization: Large (67-100%) Tunneling: No Undermining: No Wound  Description Classification: Grade 1 Wound Margin: Distinct, outline attached Exudate Amount:  Medium Exudate Type: Serous Exudate Color: amber Foul Odor After Cleansing: No Slough/Fibrino Yes Wound Bed Granulation Amount: Large (67-100%) Exposed Structure Necrotic Amount: None Present (0%) Fascia Exposed: No Fat Layer (Subcutaneous Tissue) Exposed: Yes Akhavan, Makaelah J. ([id number]) Tendon Exposed: No Muscle Exposed: No Joint Exposed: No Bone Exposed: No Periwound Skin Texture Texture Color No Abnormalities Noted: No No Abnormalities Noted: No Callus: Yes Atrophie Blanche: No Crepitus: No Cyanosis: No Excoriation: No Ecchymosis: No Induration: No Erythema: No Rash: No Hemosiderin Staining: No Scarring: No Mottled: No Pallor: No Moisture Rubor: No No Abnormalities Noted: No Dry / Scaly: Yes Temperature / Pain Maceration: No Temperature: No Abnormality Tenderness on Palpation: Yes Wound Preparation Ulcer Cleansing: Rinsed/Irrigated with Saline Topical Anesthetic Applied: Other: lidocaine 4%, Treatment Notes Wound #3 (Left, Medial Toe Great) 1. Cleansed with: Clean wound with Normal Saline 2. Anesthetic Topical Lidocaine 4% cream to wound bed prior to debridement 4. Dressing Applied: Santyl Ointment 5. Secondary Dressing Applied Gauze and Kerlix/Conform 6. Footwear/Offloading device applied Surgical shoe 7. 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 Date of Service: 09/16/2016 2:00 PM Medical Record Number: [id number] Patient Account Number: [id number] Date of Birth/Sex: [birth year] (66 y.o. Female) Treating RN: Huel Coventry Primary Care Dawn Kiper: Tonia Ghent Other  Clinician: Referring Kelley Knoth: Tonia Ghent Treating Marcile Fuquay/Extender: Linwood Dibbles, HOYT Weeks in Treatment: 9 Vital Signs Time Taken: 14:11 Temperature (F): 98.7 Height (in): 63 Pulse (bpm): 91 Weight (lbs): 224 Respiratory Rate (breaths/min): 16 Body Mass Index (BMI): 39.7 Blood Pressure (mmHg): 136/68 Reference Range: 80 - 120 mg / dl Electronic Signature(s) Signed: 09/16/2016 4:17:42 PM By: Elliot Gurney, BSN, RN, CWS, [name], BSN Entered By: Elliot Gurney, BSN, RN, CWS, Kim on 09/16/2016 14:11:36

## 2016-09-23 ENCOUNTER — Encounter: Payer: Medicare HMO | Attending: Surgery | Admitting: Surgery

## 2016-09-23 DIAGNOSIS — I509 Heart failure, unspecified: Secondary | ICD-10-CM | POA: Diagnosis not present

## 2016-09-23 DIAGNOSIS — L97522 Non-pressure chronic ulcer of other part of left foot with fat layer exposed: Secondary | ICD-10-CM | POA: Insufficient documentation

## 2016-09-23 DIAGNOSIS — F329 Major depressive disorder, single episode, unspecified: Secondary | ICD-10-CM | POA: Insufficient documentation

## 2016-09-23 DIAGNOSIS — M1990 Unspecified osteoarthritis, unspecified site: Secondary | ICD-10-CM | POA: Diagnosis not present

## 2016-09-23 DIAGNOSIS — Z880 Allergy status to penicillin: Secondary | ICD-10-CM | POA: Diagnosis not present

## 2016-09-23 DIAGNOSIS — F17218 Nicotine dependence, cigarettes, with other nicotine-induced disorders: Secondary | ICD-10-CM | POA: Insufficient documentation

## 2016-09-23 DIAGNOSIS — I110 Hypertensive heart disease with heart failure: Secondary | ICD-10-CM | POA: Insufficient documentation

## 2016-09-23 DIAGNOSIS — E11622 Type 2 diabetes mellitus with other skin ulcer: Secondary | ICD-10-CM | POA: Diagnosis not present

## 2016-09-23 DIAGNOSIS — Z885 Allergy status to narcotic agent status: Secondary | ICD-10-CM | POA: Diagnosis not present

## 2016-09-23 DIAGNOSIS — Z8673 Personal history of transient ischemic attack (TIA), and cerebral infarction without residual deficits: Secondary | ICD-10-CM | POA: Diagnosis not present

## 2016-09-23 DIAGNOSIS — Z794 Long term (current) use of insulin: Secondary | ICD-10-CM | POA: Insufficient documentation

## 2016-09-23 DIAGNOSIS — J449 Chronic obstructive pulmonary disease, unspecified: Secondary | ICD-10-CM | POA: Insufficient documentation

## 2016-09-23 DIAGNOSIS — E11621 Type 2 diabetes mellitus with foot ulcer: Secondary | ICD-10-CM | POA: Insufficient documentation

## 2016-09-24 NOTE — Progress Notes (Signed)
[name], [name] Visit Report for 09/23/2016 Arrival Information Details Patient Name: [name], [name]. Date of Service: 09/23/2016 2:00 PM Medical Record Number: [id number] Patient Account Number: [id number] Date of Birth/Sex: [birth year] (66 y.o. Female) Treating RN: Ashok Cordia, [name] Primary Care Brightyn Mozer: Tonia Ghent Other Clinician: Referring Ty Buntrock: Tonia Ghent Treating Efe Fazzino/Extender: Rudene Re in Treatment: 10 Visit Information History Since Last Visit All ordered tests and consults were completed: No Patient Arrived: Cane Added or deleted any medications: No Arrival Time: 14:19 Any new allergies or adverse reactions: No Accompanied By: SELF Had a fall or experienced change in No Transfer Assistance: None activities of daily living that may affect Patient Identification Verified: Yes risk of falls: Secondary Verification Process Completed: Yes Signs or symptoms of abuse/neglect since last No Patient Requires Transmission-Based No visito Precautions: Hospitalized since last visit: No Patient Has Alerts: Yes Has Dressing in Place as Prescribed: Yes Patient Alerts: Dm II Pain Present Now: No Electronic Signature(s) Signed: 09/23/2016 4:51:42 PM By: Alejandro Mulling Entered By: Alejandro Mulling on 09/23/2016 14:20:45 Mankin, Burkley J. ([id number]) -------------------------------------------------------------------------------- Encounter Discharge Information Details Patient Name: [name]. Date of Service: 09/23/2016 2:00 PM Medical Record Number: [id number] Patient Account Number: [id number] Date of Birth/Sex: Aug 14, 1950 (66 y.o. Female) Treating RN: Ashok Cordia, [name] Primary Care Mikaella Escalona: Tonia Ghent Other Clinician: Referring Jen Eppinger: Tonia Ghent Treating Charidy Cappelletti/Extender: Rudene Re in Treatment: 10 Encounter Discharge Information Items Discharge Pain Level: 0 Discharge Condition: Stable Ambulatory Status: Cane Discharge  Destination: Home Transportation: Private Auto Accompanied By: self Schedule Follow-up Appointment: Yes Medication Reconciliation completed No and provided to Patient/Care Dadrian Ballantine: Provided on Clinical Summary of Care: 09/23/2016 Form Type Recipient Paper Patient EP Electronic Signature(s) Signed: 09/23/2016 2:48:31 PM By: Gwenlyn Perking Entered By: Gwenlyn Perking on 09/23/2016 14:48:31 Mitchener, Atarah J. ([id number]) -------------------------------------------------------------------------------- Lower Extremity Assessment Details Patient Name: [name]. Date of Service: 09/23/2016 2:00 PM Medical Record Number: [id number] Patient Account Number: [id number] Date of Birth/Sex: 1951-02-01 (66 y.o. Female) Treating RN: Ashok Cordia, [name] Primary Care Ailyn Gladd: Tonia Ghent Other Clinician: Referring Fritzie Prioleau: Tonia Ghent Treating Sola Margolis/Extender: Rudene Re in Treatment: 10 Vascular Assessment Pulses: Dorsalis Pedis Palpable: [Right:Yes] Posterior Tibial Extremity colors, hair growth, and conditions: Extremity Color: [Right:Normal] Temperature of Extremity: [Right:Warm] Capillary Refill: [Right:< 3 seconds] Toe Nail Assessment Left: Right: Thick: No Discolored: No Deformed: No Improper Length and Hygiene: No Electronic Signature(s) Signed: 09/23/2016 4:51:42 PM By: Alejandro Mulling Entered By: Alejandro Mulling on 09/23/2016 14:28:13 Gagliardo, Jamesyn J. ([id number]) -------------------------------------------------------------------------------- Multi Wound Chart Details Patient Name: [name]. Date of Service: 09/23/2016 2:00 PM Medical Record Number: [id number] Patient Account Number: [id number] Date of Birth/Sex: [birth year] (66 y.o. Female) Treating RN: Ashok Cordia, [name] Primary Care Kacey Dysert: Tonia Ghent Other Clinician: Referring Leshea Jaggers: Tonia Ghent Treating Eugenie Harewood/Extender: Rudene Re in Treatment: 10 Vital Signs Height(in): 63 Pulse(bpm):  78 Weight(lbs): 224 Blood Pressure 127/59 (mmHg): Body Mass Index(BMI): 40 Temperature(F): 98.2 Respiratory Rate 16 (breaths/min): Photos: [3:No Photos] [N/A:N/A] Wound Location: [3:Left Toe Great - Medial] [N/A:N/A] Wounding Event: [3:Gradually Appeared] [N/A:N/A] Primary Etiology: [3:Diabetic Wound/Ulcer of the Lower Extremity] [N/A:N/A] Secondary Etiology: [3:Infection - not elsewhere classified] [N/A:N/A] Comorbid History: [3:Asthma, Chronic Obstructive Pulmonary Disease (COPD), Congestive Heart Failure, Hypertension, Type II Diabetes, Osteoarthritis, Neuropathy] [N/A:N/A] Date Acquired: [3:06/14/2016] [N/A:N/A] Weeks of Treatment: [3:10] [N/A:N/A] Wound Status: [3:Open] [N/A:N/A] Pending Amputation on Yes [N/A:N/A] Presentation: Measurements L x W x D 1x0.3x0.2 [N/A:N/A] (cm) Area (cm) : [3:0.236] [N/A:N/A] Volume (cm) : [3:0.047] [N/A:N/A] % Reduction in Area: [3:96.00%] [N/A:N/A] % Reduction  in Volume: 92.00% [N/A:N/A] Classification: [3:Grade 1] [N/A:N/A] Exudate Amount: [3:Large] [N/A:N/A] Exudate Type: [3:Serous] [N/A:N/A] Exudate Color: [3:amber] [N/A:N/A] Wound Margin: [3:Distinct, outline attached] [N/A:N/A] Granulation Amount: Large (67-100%) N/A N/A Necrotic Amount: Small (1-33%) N/A N/A Exposed Structures: Fat Layer (Subcutaneous N/A N/A Tissue) Exposed: Yes Fascia: No Tendon: No Muscle: No Joint: No Bone: No Epithelialization: Large (67-100%) N/A N/A Debridement: Debridement ([id number](11042- N/A N/A 11047) Pre-procedure 14:37 N/A N/A Verification/Time Out Taken: Pain Control: Lidocaine 4% Topical N/A N/A Solution Tissue Debrided: Fibrin/Slough, Exudates, N/A N/A Subcutaneous Level: Skin/Subcutaneous N/A N/A Tissue Debridement Area (sq 0.3 N/A N/A cm): Instrument: Curette N/A N/A Bleeding: Minimum N/A N/A Hemostasis Achieved: Pressure N/A N/A Procedural Pain: 0 N/A N/A Post Procedural Pain: 0 N/A N/A Debridement Treatment Procedure was tolerated  N/A N/A Response: well Post Debridement 1x0.3x0.2 N/A N/A Measurements L x W x D (cm) Post Debridement 0.047 N/A N/A Volume: (cm) Periwound Skin Texture: Callus: Yes N/A N/A Excoriation: No Induration: No Crepitus: No Rash: No Scarring: No Periwound Skin Dry/Scaly: Yes N/A N/A Moisture: Maceration: No Periwound Skin Color: Atrophie Blanche: No N/A N/A Cyanosis: No Ecchymosis: No Erythema: No Hemosiderin Staining: No Mottled: No Acrey, Anabel J. ([id number]) Pallor: No Rubor: No Temperature: No Abnormality N/A N/A Tenderness on Yes N/A N/A Palpation: Wound Preparation: Ulcer Cleansing: N/A N/A Rinsed/Irrigated with Saline Topical Anesthetic Applied: Other: lidocaine 4% Procedures Performed: Debridement N/A N/A Treatment Notes Wound #3 (Left, Medial Toe Great) 1. Cleansed with: Clean wound with Normal Saline 2. Anesthetic Topical Lidocaine 4% cream to wound bed prior to debridement 4. Dressing Applied: Santyl Ointment 5. Secondary Dressing Applied Dry Gauze Kerlix/Conform 7. 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 ([id number]) -------------------------------------------------------------------------------- Multi-Disciplinary Care Plan Details Patient Name: Kathleen PiggRRIS, Kenyona J. Date of Service: 09/23/2016 2:00 PM Medical Record Number: [id number] Patient Account Number: [id number] Date of Birth/Sex: 1950-11-18 (66 y.o. Female) Treating RN: Ashok CordiaPinkerton, [name] Primary Care Edlyn Rosenburg: Tonia GhentBENDER, ABBY Other Clinician: Referring Sharlett Lienemann: Tonia GhentBENDER, ABBY Treating Alyannah Sanks/Extender: Rudene ReBritto, Errol Weeks in Treatment: 10 Active Inactive ` Abuse / Safety / Falls / Self Care Management Nursing Diagnoses: Potential for falls Goals: Patient will remain injury free Date Initiated: 07/12/2016 Target Resolution Date: 10/26/2016 Goal Status:  Active Interventions: Assess fall risk on admission and as needed Assess impairment of mobility on admission and as needed per policy Notes: ` Nutrition Nursing Diagnoses: Imbalanced nutrition Impaired glucose control: actual or potential Potential for alteratiion in Nutrition/Potential for imbalanced nutrition Goals: Patient/caregiver agrees to and verbalizes understanding of need to use nutritional supplements and/or vitamins as prescribed Date Initiated: 07/12/2016 Target Resolution Date: 09/28/2016 Goal Status: Active Interventions: Assess patient nutrition upon admission and as needed per policy Provide education on elevated blood sugars and impact on wound healing Notes: [name]` [name], [name] ([id number]) Orientation to the Wound Care Program Nursing Diagnoses: Knowledge deficit related to the wound healing center program Goals: Patient/caregiver will verbalize understanding of the Wound Healing Center Program Date Initiated: 07/12/2016 Target Resolution Date: 07/27/2016 Goal Status: Active Interventions: Provide education on orientation to the wound center Notes: ` Pain, Acute or Chronic Nursing Diagnoses: Pain, acute or chronic: actual or potential Potential alteration in comfort, pain Goals: Patient/caregiver will verbalize adequate pain control between visits Date Initiated: 07/12/2016 Target Resolution Date: 10/26/2016 Goal Status: Active Interventions: Assess comfort goal upon admission Complete pain assessment as per visit requirements Notes: ` Soft Tissue Infection Nursing Diagnoses: Impaired  tissue integrity Knowledge deficit related to disease process and management Knowledge deficit related to home infection control: handwashing, handling of soiled dressings, supply storage Goals: Patient/caregiver will verbalize understanding of or measures to prevent infection and contamination in the home setting Date Initiated: 07/12/2016 Target Resolution Date:  09/28/2016 Goal Status: Active MANJU, KULKARNI ([id number]) Interventions: Assess signs and symptoms of infection every visit Notes: ` Wound/Skin Impairment Nursing Diagnoses: Impaired tissue integrity Knowledge deficit related to smoking impact on wound healing Knowledge deficit related to ulceration/compromised skin integrity Goals: Ulcer/skin breakdown will have a volume reduction of 80% by week 12 Date Initiated: 07/12/2016 Target Resolution Date: 10/26/2016 Goal Status: Active Interventions: Assess patient/caregiver ability to perform ulcer/skin care regimen upon admission and as needed Assess ulceration(s) every visit Provide education on smoking Notes: Electronic Signature(s) Signed: 09/23/2016 4:51:42 PM By: Alejandro Mulling Entered By: Alejandro Mulling on 09/23/2016 14:28:20 Cristo, Corrinna J. ([id number]) -------------------------------------------------------------------------------- Pain Assessment Details Patient Name: [name]. Date of Service: 09/23/2016 2:00 PM Medical Record Number: [id number] Patient Account Number: [id number] Date of Birth/Sex: 1950-08-08 (66 y.o. Female) Treating RN: Ashok Cordia, [name] Primary Care Marai Teehan: Tonia Ghent Other Clinician: Referring Renise Gillies: Tonia Ghent Treating [place] Doig/Extender: Rudene Re in Treatment: 10 Active Problems Location of Pain Severity and Description of Pain Patient Has Paino No Site Locations With Dressing Change: No Pain Management and Medication Current Pain Management: Electronic Signature(s) Signed: 09/23/2016 4:51:42 PM By: Alejandro Mulling Entered By: Alejandro Mulling on 09/23/2016 14:20:53 Sterba, [name] ([id number]) -------------------------------------------------------------------------------- Patient/Caregiver Education Details Patient Name: [name]. Date of Service: 09/23/2016 2:00 PM Medical Record Number: [id number] Patient Account Number: [id number] Date of Birth/Gender: 03-26-1950  (66 y.o. Female) Treating RN: Ashok Cordia, [name] Primary Care Physician: Tonia Ghent Other Clinician: Referring Physician: Tonia Ghent Treating Physician/Extender: Rudene Re in Treatment: 10 Education Assessment Education Provided To: Patient Education Topics Provided Wound/Skin Impairment: Handouts: Other: change dressing as ordered Methods: Demonstration, Explain/Verbal Responses: State content correctly Electronic Signature(s) Signed: 09/23/2016 4:51:42 PM By: Alejandro Mulling Entered By: Alejandro Mulling on 09/23/2016 14:42:25 Sage, Toniann J. ([id number]) -------------------------------------------------------------------------------- Wound Assessment Details Patient Name: [name]. Date of Service: 09/23/2016 2:00 PM Medical Record Number: [id number] Patient Account Number: [id number] Date of Birth/Sex: 1951-02-25 (66 y.o. Female) Treating RN: Ashok Cordia, [name] Primary Care Teonna Coonan: Tonia Ghent Other Clinician: Referring Mehreen Azizi: Tonia Ghent Treating Raquon Milledge/Extender: Rudene Re in Treatment: 10 Wound Status Wound Number: 3 Primary Diabetic Wound/Ulcer of the Lower Etiology: Extremity Wound Location: Left Toe Great - Medial Secondary Infection - not elsewhere classified Wounding Event: Gradually Appeared Etiology: Date Acquired: 06/14/2016 Wound Open Weeks Of Treatment: 10 Status: Clustered Wound: No Comorbid Asthma, Chronic Obstructive Pending Amputation On Presentation History: Pulmonary Disease (COPD), Congestive Heart Failure, Hypertension, Type II Diabetes, Osteoarthritis, Neuropathy Photos Photo Uploaded By: Alejandro Mulling on 09/23/2016 16:40:03 Wound Measurements Length: (cm) 1 Width: (cm) 0.3 Depth: (cm) 0.2 Area: (cm) 0.236 Volume: (cm) 0.047 % Reduction in Area: 96% % Reduction in Volume: 92% Epithelialization: Large (67-100%) Tunneling: No Undermining: No Wound Description Classification: Grade 1 Wound Margin:  Distinct, outline attached Exudate Amount: Large Exudate Type: Serous Exudate Color: amber Foul Odor After Cleansing: No Slough/Fibrino Yes Wound Bed Staffieri, Marlenne J. ([id number]) Granulation Amount: Large (67-100%) Exposed Structure Necrotic Amount: Small (1-33%) Fascia Exposed: No Necrotic Quality: Adherent Slough Fat Layer (Subcutaneous Tissue) Exposed: Yes Tendon Exposed: No Muscle Exposed: No Joint Exposed: No Bone Exposed: No Periwound Skin Texture Texture Color No Abnormalities Noted: No No Abnormalities Noted: No Callus: Yes Atrophie Blanche:  No Crepitus: No Cyanosis: No Excoriation: No Ecchymosis: No Induration: No Erythema: No Rash: No Hemosiderin Staining: No Scarring: No Mottled: No Pallor: No Moisture Rubor: No No Abnormalities Noted: No Dry / Scaly: Yes Temperature / Pain Maceration: No Temperature: No Abnormality Tenderness on Palpation: Yes Wound Preparation Ulcer Cleansing: Rinsed/Irrigated with Saline Topical Anesthetic Applied: Other: lidocaine 4%, Treatment Notes Wound #3 (Left, Medial Toe Great) 1. Cleansed with: Clean wound with Normal Saline 2. Anesthetic Topical Lidocaine 4% cream to wound bed prior to debridement 4. Dressing Applied: Santyl Ointment 5. Secondary Dressing Applied Dry Gauze Kerlix/Conform 7. 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 ([id number]) Georg, Deshea Shela Commons ([id number]) -------------------------------------------------------------------------------- Vitals Details Patient Name: [name]. Date of Service: 09/23/2016 2:00 PM Medical Record Number: [id number] Patient Account Number: [id number] Date of Birth/Sex: November 17, 1950 (66 y.o. Female) Treating RN: Ashok Cordia, [name] Primary Care Winna Golla: Tonia Ghent Other Clinician: Referring Chaske Paskett: Tonia Ghent Treating  Kamdon Reisig/Extender: Rudene Re in Treatment: 10 Vital Signs Time Taken: 14:20 Temperature (F): 98.2 Height (in): 63 Pulse (bpm): 78 Weight (lbs): 224 Respiratory Rate (breaths/min): 16 Body Mass Index (BMI): 39.7 Blood Pressure (mmHg): 127/59 Reference Range: 80 - 120 mg / dl Electronic Signature(s) Signed: 09/23/2016 4:51:42 PM By: Alejandro Mulling Entered By: Alejandro Mulling on 09/23/2016 14:22:36

## 2016-09-24 NOTE — Progress Notes (Signed)
LIAH, MORR ([id number]) Visit Report for 09/23/2016 Chief Complaint Document Details Patient Name: [name], [name]. Date of Service: 09/23/2016 2:00 PM Medical Record Number: [id number] Patient Account Number: [id number] Date of Birth/Sex: September 23, 1950 (66 y.o. Female) Treating RN: Huel Coventry Primary Care Provider: Tonia Ghent Other Clinician: Referring Provider: Tonia Ghent Treating Provider/Extender: Rudene Re in Treatment: 10 Information Obtained from: Patient Chief Complaint [name] presents today for evaluation of her left medial hallux and left lateral lower extremity ulcers Electronic Signature(s) Signed: 09/23/2016 2:45:23 PM By: Evlyn Kanner MD, FACS Entered By: Evlyn Kanner on 09/23/2016 14:45:23 Lapaglia, [name] ([id number]) -------------------------------------------------------------------------------- Debridement Details Patient Name: [name]. Date of Service: 09/23/2016 2:00 PM Medical Record Number: [id number] Patient Account Number: [id number] Date of Birth/Sex: 05-15-1950 (66 y.o. Female) Treating RN: Huel Coventry Primary Care Provider: Tonia Ghent Other Clinician: Referring Provider: Tonia Ghent Treating Provider/Extender: Rudene Re in Treatment: 10 Debridement Performed for Wound #3 Left,Medial Toe Great Assessment: Performed By: Physician Evlyn Kanner, MD Debridement: Debridement Severity of Tissue Pre Fat layer exposed Debridement: Pre-procedure Verification/Time Out Yes - 14:37 Taken: Start Time: 14:38 Pain Control: Lidocaine 4% Topical Solution Level: Skin/Subcutaneous Tissue Total Area Debrided (L x 1 (cm) x 0.3 (cm) = 0.3 (cm) W): Tissue and other Viable, Non-Viable, Exudate, Fibrin/Slough, Subcutaneous material debrided: Instrument: Curette Bleeding: Minimum Hemostasis Achieved: Pressure End Time: 14:40 Procedural Pain: 0 Post Procedural Pain: 0 Response to Treatment: Procedure was tolerated well Post  Debridement Measurements of Total Wound Length: (cm) 1 Width: (cm) 0.3 Depth: (cm) 0.2 Volume: (cm) 0.047 Character of Wound/Ulcer Post Requires Further Debridement Debridement: Severity of Tissue Post Debridement: Fat layer exposed Post Procedure Diagnosis Same as Pre-procedure Electronic Signature(s) Signed: 09/23/2016 2:45:13 PM By: Evlyn Kanner MD, FACS Signed: 09/23/2016 5:28:17 PM By: Elliot Gurney, BSN, RN, CWS, [name], BSN [name], [name] ([id number]) Previous Signature: 09/23/2016 2:44:56 PM Version By: Evlyn Kanner MD, FACS Entered By: Evlyn Kanner on 09/23/2016 14:45:12 [name], [name] ([id number]) -------------------------------------------------------------------------------- HPI Details Patient Name: [name], [name]. Date of Service: 09/23/2016 2:00 PM Medical Record Number: [id number] Patient Account Number: [id number] Date of Birth/Sex: 1950/06/30 (66 y.o. Female) Treating RN: Huel Coventry Primary Care Provider: Tonia Ghent Other Clinician: Referring Provider: Tonia Ghent Treating Provider/Extender: Rudene Re in Treatment: 10 History of Present Illness Location: left medial hallux; LLE lateral Duration: left medial hallux-4 weeks; LLE lateral-2-1/2 weeks Timing: left medial hallux-pain with pressure/standing, ambulating, manipulation; LLE lateral-pain with manipulation Context: left medial hallux-originally was a crack in the skin; LLE lateral-traumatic injury with car door Modifying Factors: uncontrolled diabetes and smoking are exacerbating factors to wound healing HPI Description: 07/12/16- the patient arrives for initial evaluation of a left hallux and left lower extremity lateral ulcer. She states that the left hallux originally had a crack in the skin which she began to apply AandD ointment. She went to her PCP at [facility] on 4/4, while going into her appointment she struck her left lateral leg on the car door. At that appointment she was  given a prescription for Bactrim and completed that on 4/14 with no adverse reaction. She states that it made little to no improvement in her toe wound. She went back for a follow-up visit on 4/18 and was prescribed doxycycline for 10 days. She continues to apply AandD topical ointment along with soaking her foot in warm water. She is a diabetic, most recent A1c of 9% (I do not have records of this, this is per patient  report). She is both on oral agents and insulin. She is a current smoker, approximately 0.5 ppd. She denies any remote or recent evaluation regarding arterial or venous disease. 07/19/16 Patient presents today for follow-up evaluation concerning her left great toe wound immediately and left lateral lower extremity wound. Unfortunately though both wounds appear better visually she is having a lot of discomfort. I'm unsure if this is a preformed drop of the issue or potentially the ideas or appointment is causing irritation which has caused her discomfort and pain. The good news is her wound culture came back negative and showed only normal skin flora and her x-ray of the left foot was also negative for any osseous change or osteomyelitis. Unfortunately she continues to have a lot of discomfort she would rate it a seven out of 10 right now. 07/26/2016 -- she is doing much better since the Iodoflex was stopped and she is on Santyl. Her culture report was negative and only had normal skin flora. She continues to smoke about a half pack of cigarettes a day. 08/05/2016 -- she is doing much better with her smoking and is down to about 5 cigarettes a day. The left lower extremity wound is healed and she continues to have a lot of tenderness on her left big toe. In the course of conversation we realize she was using alcohol to clean her left big toe and we have told her to stop this. 09/16/16 on evaluation today patient appears to be doing better in regard to her left first toe wound. With  that being said she is having some discomfort at the proximal aspect which is worrisome for potential infection although there is not a significant amount of erythema noted at this point. She states this is new and she has not had pain like this up to this point. Fortunately she has no red streaks upper leg and no evidence of more extensive cellulitis. The wound is also measuring smaller. [name], RESH ([id number]) Electronic Signature(s) Signed: 09/23/2016 2:45:33 PM By: Evlyn Kanner MD, FACS Entered By: Evlyn Kanner on 09/23/2016 14:45:33 [name], [name] ([id number]) -------------------------------------------------------------------------------- Physical Exam Details Patient Name: JALEEAH, SLIGHT. Date of Service: 09/23/2016 2:00 PM Medical Record Number: [id number] Patient Account Number: [id number] Date of Birth/Sex: 1950-10-06 (66 y.o. Female) Treating RN: Huel Coventry Primary Care Provider: Tonia Ghent Other Clinician: Referring Provider: Tonia Ghent Treating Provider/Extender: Rudene Re in Treatment: 10 Constitutional . Pulse regular. Respirations normal and unlabored. Afebrile. . Eyes Nonicteric. Reactive to light. Ears, Nose, Mouth, and Throat Lips, teeth, and gums WNL.Marland Kitchen Moist mucosa without lesions. Neck supple and nontender. No palpable supraclavicular or cervical adenopathy. Normal sized without goiter. Respiratory WNL. No retractions.. Cardiovascular Pedal Pulses WNL. No clubbing, cyanosis or edema. Lymphatic No adneopathy. No adenopathy. No adenopathy. Musculoskeletal Adexa without tenderness or enlargement.. Digits and nails w/o clubbing, cyanosis, infection, petechiae, ischemia, or inflammatory conditions.. Integumentary (Hair, Skin) No suspicious lesions. No crepitus or fluctuance. No peri-wound warmth or erythema. No masses.Marland Kitchen Psychiatric Judgement and insight Intact.. No evidence of depression, anxiety, or agitation.. Notes she has developed quite a  bit of slough in the ulcer base and scattered the edges are not sharply removed this with a #3 curet and bleeding controlled with pressure Electronic Signature(s) Signed: 09/23/2016 2:46:10 PM By: Evlyn Kanner MD, FACS Entered By: Evlyn Kanner on 09/23/2016 14:46:09 [name], [name] ([id number]) -------------------------------------------------------------------------------- Physician Orders Details Patient Name: [name]. Date of Service: 09/23/2016 2:00 PM Medical Record Number: [id number] Patient Account  Number: [id number] Date of Birth/Sex: 1951/01/11 64(66 y.o. Female) Treating RN: Ashok CordiaPinkerton, [name] Primary Care Provider: Tonia GhentBENDER, ABBY Other Clinician: Referring Provider: Tonia GhentBENDER, ABBY Treating Provider/Extender: Rudene ReBritto, Tyjon Bowen Weeks in Treatment: 10 Verbal / Phone Orders: Yes Clinician: Ashok CordiaPinkerton, [name] Read Back and Verified: Yes Diagnosis Coding Wound Cleansing Wound #3 Left,Medial Toe Great o Clean wound with Normal Saline. o Cleanse wound with mild soap and water Anesthetic Wound #3 Left,Medial Toe Great o Topical Lidocaine 4% cream applied to wound bed prior to debridement Primary Wound Dressing Wound #3 Left,Medial Toe Great o Santyl Ointment Secondary Dressing Wound #3 Left,Medial Toe Great o Dry Gauze o Gauze and Kerlix/Conform o Other - tape Dressing Change Frequency Wound #3 Left,Medial Toe Great o Change dressing every day. Follow-up Appointments Wound #3 Left,Medial Toe Great o Return Appointment in 1 week. Edema Control Wound #3 Left,Medial Toe Great o Elevate legs to the level of the heart and pump ankles as often as possible Additional Orders / Instructions Wound #3 Left,Medial Toe Great o Stop Smoking o Increase protein intake. [name], Traniyah J. ([id number]) Medications-please add to medication list. Wound #3 Left,Medial Toe Great o Santyl Enzymatic Ointment - continue Electronic Signature(s) Signed: 09/23/2016 4:25:24 PM By:  Evlyn KannerBritto, Evelisse Szalkowski MD, FACS Signed: 09/23/2016 4:51:42 PM By: Alejandro MullingPinkerton, Debra Entered By: Alejandro MullingPinkerton, Debra on 09/23/2016 14:41:17 [name], [name] ([id number]) -------------------------------------------------------------------------------- Problem List Details Patient Name: [name], Maresha J. Date of Service: 09/23/2016 2:00 PM Medical Record Number: [id number] Patient Account Number: [id number] Date of Birth/Sex: 1951/01/11 (66 y.o. 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 ([id number]) [name], [name] ([id number]) -------------------------------------------------------------------------------- Progress Note Details Patient Name: Kathleen PiggPARRIS, [name] Date of Service: 09/23/2016 2:00 PM Medical Record Number: [id number] Patient Account Number: [id number] Date of Birth/Sex: 1951/01/11 (66 y.o. Female) Treating RN: Huel CoventryWoody, Kim Primary Care Provider: Tonia GhentBENDER, ABBY Other Clinician: Referring Provider: Tonia GhentBENDER, ABBY Treating Provider/Extender:  Rudene ReBritto, Santonio Speakman Weeks in Treatment: 10 Subjective Chief Complaint Information obtained from Patient [name] presents today for evaluation of her left medial hallux and left lateral lower extremity ulcers History of Present Illness (HPI) The following HPI elements were documented for the patient's wound: Location: left medial hallux; LLE lateral Duration: left medial hallux-4 weeks; LLE lateral-2-1/2 weeks Timing: left medial hallux-pain with pressure/standing, ambulating, manipulation; LLE lateral-pain with manipulation Context: left medial hallux-originally was a crack in the skin; LLE lateral-traumatic injury with car door Modifying Factors: uncontrolled diabetes and smoking are exacerbating factors to wound healing 07/12/16- the patient arrives for initial evaluation of a left hallux and left lower extremity lateral ulcer. She states that the left hallux originally had a crack in the skin which she began to apply AandD ointment. She went to her P[facility], while going into her appointment she struck her left lateral leg on the car door. At that appointment she was given a prescription for Bactrim and completed that on 4/14 with no adverse reaction. She states that it made little to no improvement in her toe wound. She went back for a follow-up visit on 4/18 and was prescribed doxycycline for 10 days. She continues to apply AandD topical ointment along with soaking her foot in warm water. She is a diabetic, most recent A1c of 9% (I do not have records of this, this is per patient report). She is both on oral agents and insulin. She is a current smoker, approximately 0.5 ppd. She denies any remote or recent evaluation regarding arterial or venous disease. 07/19/16 Patient presents today for follow-up evaluation concerning her left great toe wound immediately and left lateral lower extremity wound. Unfortunately though both wounds appear better visually she  is having a lot of discomfort. I'm unsure if this is a preformed drop of the issue or potentially the ideas or appointment is causing irritation which has caused her discomfort and pain. The good news is her wound culture came back negative and showed only normal skin flora and her x-ray of the left foot was also negative for any osseous change or osteomyelitis. Unfortunately she continues to have a lot of discomfort she would rate it a seven out of 10 right now. 07/26/2016 -- she is doing much better since the Iodoflex was stopped and she is on Santyl. Her culture report was negative and only had normal skin flora. She continues to smoke about a half pack of cigarettes a day. 08/05/2016 -- she is doing much better with her smoking and is down to about 5 cigarettes a day. The left lower extremity wound is healed and she continues to have a lot of tenderness on her left big toe. In the EDRA, RICCARDI ([id number]) course of conversation we realize she was using alcohol to clean her left big toe and we have told her to stop this. 09/16/16 on evaluation today patient appears to be doing better in regard to her left first toe wound. With that being said she is having some discomfort at the proximal aspect which is worrisome for potential infection although there is not a significant amount of erythema noted at this point. She states this is new and she has not had pain like this up to this point. Fortunately she has no red streaks upper leg and no evidence of more extensive cellulitis. The wound is also measuring smaller. Objective Constitutional Pulse regular. Respirations normal and unlabored. Afebrile. Vitals Time Taken: 2:20 PM, Height: 63 in, Weight: 224 lbs, BMI: 39.7, Temperature: 98.2 F, Pulse: 78 bpm, Respiratory Rate: 16 breaths/min, Blood Pressure: 127/59 mmHg. Eyes Nonicteric. Reactive to light. Ears, Nose, Mouth, and Throat Lips, teeth, and gums WNL.Marland Kitchen Moist mucosa without  lesions. Neck supple and nontender. No palpable supraclavicular or cervical adenopathy. Normal sized without goiter. Respiratory WNL. No retractions.. Cardiovascular Pedal Pulses WNL. No clubbing, cyanosis or edema. Lymphatic No adneopathy. No adenopathy. No adenopathy. Musculoskeletal Adexa without tenderness or enlargement.. Digits and nails w/o clubbing, cyanosis, infection, petechiae, ischemia, or inflammatory conditions.Marland Kitchen Psychiatric Judgement and insight Intact.. No evidence of depression, anxiety, or agitation.Marland Kitchen SEMIRA, STOLTZFUS ([id number]) General Notes: she has developed quite a bit of slough in the ulcer base and scattered the edges are not sharply removed this with a #3 curet and bleeding controlled with pressure Integumentary (Hair, Skin)  No suspicious lesions. No crepitus or fluctuance. No peri-wound warmth or erythema. No masses.. Wound #3 status is Open. Original cause of wound was Gradually Appeared. The wound is located on the [employer]. The wound measures 1cm length x 0.3cm width x 0.2cm depth; 0.236cm^2 area and 0.047cm^3 volume. There is Fat Layer (Subcutaneous Tissue) Exposed exposed. There is no tunneling or undermining noted. There is a large amount of serous drainage noted. The wound margin is distinct with the outline attached to the wound base. There is large (67-100%) granulation within the wound bed. There is a small (1-33%) amount of necrotic tissue within the wound bed including Adherent Slough. The periwound skin appearance exhibited: Callus, Dry/Scaly. The periwound skin appearance did not exhibit: Crepitus, Excoriation, Induration, Rash, Scarring, Maceration, Atrophie Blanche, Cyanosis, Ecchymosis, Hemosiderin Staining, Mottled, Pallor, Rubor, Erythema. Periwound temperature was noted as No Abnormality. The periwound has tenderness on palpation. Assessment Active Problems ICD-10 E11.621 - Type 2 diabetes mellitus with foot ulcer E11.622 -  Type 2 diabetes mellitus with other skin ulcer F17.218 - Nicotine dependence, cigarettes, with other nicotine-induced disorders L97.522 - Non-pressure chronic ulcer of other part of left foot with fat layer exposed Procedures Wound #3 Pre-procedure diagnosis of Wound #3 is a Diabetic Wound/Ulcer of the Lower Extremity located on the Left,Medial Toe Great .Severity of Tissue Pre Debridement is: Fat layer exposed. There was a Skin/Subcutaneous Tissue Debridement ([id number]) debridement with total area of 0.3 sq cm performed by Evlyn Kanner, MD. with the following instrument(s): Curette to remove Viable and Non-Viable tissue/material including Exudate, Fibrin/Slough, and Subcutaneous after achieving pain control using Lidocaine 4% Topical Solution. A time out was conducted at 14:37, prior to the start of the procedure. A Minimum amount of bleeding was controlled with Pressure. The procedure was tolerated well with a pain level of 0 throughout and a pain level of 0 following the procedure. Post Debridement Measurements: 1cm length x 0.3cm width x 0.2cm depth; 0.047cm^3 volume. Character of Wound/Ulcer Post Debridement requires further debridement. Severity of Tissue Post JERIAH, SKUFCA. ([id number]) Debridement is: Fat layer exposed. Post procedure Diagnosis Wound #3: Same as Pre-Procedure Plan Wound Cleansing: Wound #3 Left,Medial Toe Great: Clean wound with Normal Saline. Cleanse wound with mild soap and water Anesthetic: Wound #3 Left,Medial Toe Great: Topical Lidocaine 4% cream applied to wound bed prior to debridement Primary Wound Dressing: Wound #3 Left,Medial Toe Great: Santyl Ointment Secondary Dressing: Wound #3 Left,Medial Toe Great: Dry Gauze Gauze and Kerlix/Conform Other - tape Dressing Change Frequency: Wound #3 Left,Medial Toe Great: Change dressing every day. Follow-up Appointments: Wound #3 Left,Medial Toe Great: Return Appointment in 1 week. Edema  Control: Wound #3 Left,Medial Toe Great: Elevate legs to the level of the heart and pump ankles as often as possible Additional Orders / Instructions: Wound #3 Left,Medial Toe Great: Stop Smoking Increase protein intake. Medications-please add to medication list.: Wound #3 Left,Medial Toe Great: Santyl Enzymatic Ointment - continue After sharp debridement today I have recommended: Suitt, Lydiah J. ([id number]) 1. Santyl ointment to be continued to the left big toe daily. 2. continue to work on stopping cigarette smoking 3. Adequate protein, vitamin A, vitamin C and zinc 4. regular visits the wound center Electronic Signature(s) Signed: 09/23/2016 2:46:54 PM By: Evlyn Kanner MD, FACS Entered By: Evlyn Kanner on 09/23/2016 14:46:53 Gunnels, [name] ([id number]) -------------------------------------------------------------------------------- SuperBill Details Patient Name: [name]. Date of Service: 09/23/2016 Medical Record Number: [id number] Patient Account Number: [id number] Date of Birth/Sex: 11-21-1950 (66 y.o. 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 2 diabetes mellitus with foot ulcer E11.622 Type 2 diabetes mellitus with other skin ulcer F17.218 Nicotine dependence, cigarettes, with other nicotine-induced disorders L97.522 Non-pressure chronic ulcer of other part of left foot with fat layer exposed Facility Procedures CPT4 Code Description: [id number] 11042 - DEB SUBQ TISSUE 20 SQ CM/< ICD-10 Description Diagnosis 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 2 diabetes mellitus with foot ulcer E11.622 Type 2 diabetes mellitus with other skin ulcer L97.522 Non-pressure chronic ulcer of other part of  left foot Modifier: with fat lay Quantity: 1 er exposed Physician Procedures CPT4 Code Description:  [id number] 11042 - WC PHYS SUBQ TISS 20 SQ CM ICD-10 Description Diagnosis 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 2 diabetes mellitus with foot ulcer E11.622 Type 2 diabetes mellitus with other skin ulcer L97.522 Non-pressure chronic ulcer of other part of  left foot Modifier: with fat laye Quantity: 1 r exposed Electronic Signature(s) Signed: 09/23/2016 2:47:10 PM By: Evlyn Kanner MD, FACS Entered By: Evlyn Kanner on 09/23/2016 14:47:10

## 2016-10-04 ENCOUNTER — Encounter: Payer: Medicare HMO | Admitting: Physician Assistant

## 2016-10-04 DIAGNOSIS — E11621 Type 2 diabetes mellitus with foot ulcer: Secondary | ICD-10-CM | POA: Diagnosis not present

## 2016-10-06 NOTE — Progress Notes (Signed)
[name], Samiyah J. ([id number]) Visit Report for 10/04/2016 Chief Complaint Document Details Patient Name: [name], English J. Date of Service: 10/04/2016 2:45 PM Medical Record Number: [id number] Patient Account Number: [id number] Date of Birth/Sex: 02/18/1951 (66 y.o. Female) Treating RN: Afful, RN, BSN, [place] Primary Care Provider: Tonia GhentBENDER, ABBY Other Clinician: Referring Provider: Tonia GhentBENDER, ABBY Treating Provider/Extender: Linwood DibblesSTONE III, [place] Silveria Weeks in Treatment: 12 Information Obtained from: Patient Chief Complaint [name] presents today for evaluation of her left medial hallux and left lateral lower extremity ulcers Electronic Signature(s) Signed: 10/04/2016 6:13:19 PM By: Lenda KelpStone III, Chelcy Bolda PA-C Entered By: Lenda KelpStone III, Derris Millan on 10/04/2016 15:16:17 Tener, [name] ([id number]) -------------------------------------------------------------------------------- Debridement Details Patient Name: Kathleen PiggPARRIS, Kafi J. Date of Service: 10/04/2016 2:45 PM Medical Record Number: [id number] Patient Account Number: [id number] Date of Birth/Sex: 02/18/1951 (66 y.o. Female) Treating RN: Afful, RN, BSN, [place] Sinkita Primary Care Provider: Tonia GhentBENDER, ABBY Other Clinician: Referring Provider: Tonia GhentBENDER, ABBY Treating Provider/Extender: Linwood DibblesSTONE III, Lakisa Lotz Weeks in Treatment: 12 Debridement Performed for Wound #3 Left,Medial Toe Great Assessment: Performed By: Physician STONE III, Darnetta Kesselman E., PA-C Debridement: Debridement Severity of Tissue Pre Fat layer exposed Debridement: Pre-procedure Verification/Time Out Yes - 15:21 Taken: Start Time: 15:21 Pain Control: Lidocaine 4% Topical Solution Level: Skin/Subcutaneous Tissue Total Area Debrided (L x 1 (cm) x 0.4 (cm) = 0.4 (cm) W): Tissue and other Non-Viable, Exudate, Fat, Fibrin/Slough, Subcutaneous material debrided: Instrument: Curette Bleeding: Minimum Hemostasis Achieved: Pressure End Time: 15:22 Procedural Pain: 3 Post Procedural Pain: 3 Response to Treatment:  Procedure was tolerated well Post Debridement Measurements of Total Wound Character of Wound/Ulcer Post Requires Further Debridement Debridement: Severity of Tissue Post Debridement: Fat layer exposed Post Procedure Diagnosis Same as Pre-procedure Electronic Signature(s) Signed: 10/04/2016 4:24:08 PM By: Elpidio EricAfful, Rita BSN, RN Signed: 10/04/2016 6:13:19 PM By: Lenda KelpStone III, Celina Shiley PA-C Entered By: Elpidio EricAfful, Rita on 10/04/2016 15:23:47 Huxford, Olukemi J. ([id number]) -------------------------------------------------------------------------------- HPI Details Patient Name: Kathleen PiggPARRIS, Phillipa J. Date of Service: 10/04/2016 2:45 PM Medical Record Number: [id number] Patient Account Number: [id number] Date of Birth/Sex: 02/18/1951 (66 y.o. Female) Treating RN: Afful, RN, BSN, Psychologist, clinicalita Primary Care Provider: Tonia GhentBENDER, ABBY Other Clinician: Referring Provider: Tonia GhentBENDER, ABBY Treating Provider/Extender: Linwood DibblesSTONE III, Jonothan Heberle Weeks in Treatment: 12 History of Present Illness Location: left medial hallux; LLE lateral Duration: left medial hallux-4 weeks; LLE lateral-2-1/2 weeks Timing: left medial hallux-pain with pressure/standing, ambulating, manipulation; LLE lateral-pain with manipulation Context: left medial hallux-originally was a crack in the skin; LLE lateral-traumatic injury with car door Modifying Factors: uncontrolled diabetes and smoking are exacerbating factors to wound healing HPI Description: 07/12/16- the patient arrives for initial evaluation of a left hallux and left lower extremity lateral ulcer. She states that the left hallux originally had a crack in the skin which she began to apply AandD ointment. [facility], while going into her appointment she struck her left lateral leg on the car door. At that appointment she was given a prescription for Bactrim and completed that on 4/14 with no adverse reaction. She states that it made little to no improvement in  her toe wound. She went back for a follow-up visit on 4/18 and was prescribed doxycycline for 10 days. She continues to apply AandD topical ointment along with soaking her foot in warm water. She is a diabetic, most recent A1c of 9% (I do not have records of this, this is per patient report). She is both on oral agents and insulin. She is a current smoker, approximately 0.5 ppd. She  denies any remote or recent evaluation regarding arterial or venous disease. 07/19/16 Patient presents today for follow-up evaluation concerning her left great toe wound immediately and left lateral lower extremity wound. Unfortunately though both wounds appear better visually she is having a lot of discomfort. I'm unsure if this is a preformed drop of the issue or potentially the ideas or appointment is causing irritation which has caused her discomfort and pain. The good news is her wound culture came back negative and showed only normal skin flora and her x-ray of the left foot was also negative for any osseous change or osteomyelitis. Unfortunately she continues to have a lot of discomfort she would rate it a seven out of 10 right now. 07/26/2016 -- she is doing much better since the Iodoflex was stopped and she is on Santyl. Her culture report was negative and only had normal skin flora. She continues to smoke about a half pack of cigarettes a day. 08/05/2016 -- she is doing much better with her smoking and is down to about 5 cigarettes a day. The left lower extremity wound is healed and she continues to have a lot of tenderness on her left big toe. In the course of conversation we realize she was using alcohol to clean her left big toe and we have told her to stop this. 09/16/16 on evaluation today patient appears to be doing better in regard to her left first toe wound. With that being said she is having some discomfort at the proximal aspect which is worrisome for potential infection although there is not a  significant amount of erythema noted at this point. She states this is new and she has not had pain like this up to this point. Fortunately she has no red streaks upper leg and no evidence of more extensive cellulitis. The wound is also measuring smaller. SHARICA, ROEDEL ([id number]) 10/04/16 on evaluation today patient appears to be doing fairly well in regard to her left first toe ulcer. She does have some Slough covering unfortunately she is not having as much discomfort she still has a little bit of discomfort at the proximal portion of the wound there is no evidence of erythema or infection. Electronic Signature(s) Signed: 10/04/2016 6:13:19 PM By: Lenda Kelp PA-C Entered By: Lenda Kelp on 10/04/2016 15:31:12 Binegar, [name] ([id number]) -------------------------------------------------------------------------------- Physical Exam Details Patient Name: MARNESHA, GAGEN. Date of Service: 10/04/2016 2:45 PM Medical Record Number: [id number] Patient Account Number: [id number] Date of Birth/Sex: September 17, 1950 (66 y.o. Female) Treating RN: Afful, RN, BSN, [employer] Primary Care Provider: Tonia Ghent Other Clinician: Referring Provider: Tonia Ghent Treating Provider/Extender: STONE III, Jatavion Peaster Weeks in Treatment: 12 Constitutional Well-nourished and well-hydrated in no acute distress. Respiratory normal breathing without difficulty. Psychiatric this patient is able to make decisions and demonstrates good insight into disease process. Alert and Oriented x 3. pleasant and cooperative. Notes Patient left first toe wound did require sharp debridement today which he tolerated fairly well without complication. Post debridement she had an excellent wound bed noted. Electronic Signature(s) Signed: 10/04/2016 6:13:19 PM By: Lenda Kelp PA-C Entered By: Lenda Kelp on 10/04/2016 15:31:49 Clayborn, [name]  ([id number]) -------------------------------------------------------------------------------- Physician Orders Details Patient Name: MAECIE, SEVCIK. Date of Service: 10/04/2016 2:45 PM Medical Record Number: [id number] Patient Account Number: [id number] Date of Birth/Sex: 1950/12/09 (66 y.o. Female) Treating RN: Afful, RN, BSN, [employer] Primary Care Provider: Tonia Ghent Other Clinician: Referring Provider: Tonia Ghent Treating Provider/Extender: STONE III, Arrabella Westerman Weeks in Treatment: 12  Verbal / Phone Orders: No Diagnosis Coding ICD-10 Coding Code Description 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 2 diabetes mellitus with foot ulcer E11.622 Type 2 diabetes mellitus with other skin ulcer F17.218 Nicotine dependence, cigarettes, with other nicotine-induced disorders L97.522 Non-pressure chronic ulcer of other part of left foot with fat layer exposed Wound Cleansing Wound #3 Left,Medial Toe Great o Clean wound with Normal Saline. o Cleanse wound with mild soap and water Anesthetic Wound #3 Left,Medial Toe Great o Topical Lidocaine 4% cream applied to wound bed prior to debridement Primary Wound Dressing Wound #3 Left,Medial Toe Great o Santyl Ointment Secondary Dressing Wound #3 Left,Medial Toe Great o Dry Gauze o Gauze and Kerlix/Conform o Other - tape Dressing Change Frequency Wound #3 Left,Medial Toe Great o Change dressing every day. Follow-up Appointments Wound #3 Left,Medial Toe Great o Return Appointment in 1 week. SUMAYAH, BEARSE ([id number]) Edema Control Wound #3 Left,Medial Toe Great o Elevate legs to the level of the heart and pump ankles as often as possible Additional Orders / Instructions Wound #3 Left,Medial Toe Great o Stop Smoking o Increase protein intake. Medications-please add to medication list. Wound #3 Left,Medial Toe Great o Santyl Enzymatic Ointment - continue Notes I am going to recommend that we continue with the Current wound care measures per  above other than I'm discontinuing the Santyl and we are going to initiate Prisma for her. Hopefully this will do well in regard to helping to fill in this wound and seated completely healed. If anything worsens in the interim she will contact our office for additional recommendations. Otherwise pleasure caring for the patient today. Electronic Signature(s) Signed: 10/04/2016 6:13:19 PM By: Lenda Kelp PA-C Entered By: Lenda Kelp on 10/04/2016 15:33:28 Weatherholtz, [name] ([id number]) -------------------------------------------------------------------------------- Problem List Details Patient Name: BREASIA, KARGES. Date of Service: 10/04/2016 2:45 PM Medical Record Number: [id number] Patient Account Number: [id number] Date of Birth/Sex: 09/04/1950 (66 y.o. Female) Treating RN: Afful, RN, BSN, [place] Sink Primary Care Provider: Tonia Ghent Other Clinician: Referring Provider: Tonia Ghent Treating Provider/Extender: Lenda Kelp Weeks in Treatment: 12 Active Problems ICD-10 Encounter Code Description Active Date Diagnosis 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 2 diabetes mellitus with foot ulcer 07/12/2016 Yes E11.622 Type 2 diabetes mellitus with other skin ulcer 07/12/2016 Yes F17.218 Nicotine dependence, cigarettes, with other nicotine- 07/12/2016 Yes induced disorders L97.522 Non-pressure chronic ulcer of other part of left foot with fat 08/30/2016 Yes layer exposed Inactive Problems Resolved Problems ICD-10 Code Description Active Date Resolved Date L97.511 Non-pressure chronic ulcer of other part of right foot 07/12/2016 07/12/2016 limited to breakdown of skin L97.212 Non-pressure chronic ulcer of right calf with fat layer 07/12/2016 07/12/2016 exposed Electronic Signature(s) Signed: 10/04/2016 6:13:19 PM By: Lenda Kelp PA-C Entered By: Lenda Kelp on 10/04/2016 15:16:10 Jocson, [name] ([id number]) Tousley, Devorah J.  ([id number]) -------------------------------------------------------------------------------- Progress Note Details Patient Name: Kathleen Pigg. Date of Service: 10/04/2016 2:45 PM Medical Record Number: [id number] Patient Account Number: [id number] Date of Birth/Sex: Jan 12, 1951 (66 y.o. Female) Treating RN: Afful, RN, BSN, Psychologist, clinical Primary Care Provider: Tonia Ghent Other Clinician: Referring Provider: Tonia Ghent Treating Provider/Extender: Linwood Dibbles, Amarie Viles Weeks in Treatment: 12 Subjective Chief Complaint Information obtained from Patient Mrs. Deyarmin presents today for evaluation of her left medial hallux and left lateral lower extremity ulcers History of Present Illness (HPI) The following HPI elements were documented for the patient's wound: Location: left medial hallux; LLE lateral Duration: left medial hallux-4 weeks; LLE lateral-2-1/2 weeks Timing: left medial hallux-pain with pressure/standing, ambulating, manipulation; LLE lateral-pain with  manipulation Context: left medial hallux-originally was a crack in the skin; LLE lateral-traumatic injury with car door Modifying Factors: uncontrolled diabetes and smoking are exacerbating factors to wound healing 07/12/16- the patient arrives for initial evaluation of a left hallux and left lower extremity lateral ulcer. She states that the left hallux originally had a crack in the skin which she began to apply AandD ointment. She went to her PCP at [facility] on 4/4, while going into her appointment she struck her left lateral leg on the car door. At that appointment she was given a prescription for Bactrim and completed that on 4/14 with no adverse reaction. She states that it made little to no improvement in her toe wound. She went back for a follow-up visit on 4/18 and was prescribed doxycycline for 10 days. She continues to apply AandD topical ointment along with soaking her foot in warm water. She is a diabetic, most  recent A1c of 9% (I do not have records of this, this is per patient report). She is both on oral agents and insulin. She is a current smoker, approximately 0.5 ppd. She denies any remote or recent evaluation regarding arterial or venous disease. 07/19/16 Patient presents today for follow-up evaluation concerning her left great toe wound immediately and left lateral lower extremity wound. Unfortunately though both wounds appear better visually she is having a lot of discomfort. I'm unsure if this is a preformed drop of the issue or potentially the ideas or appointment is causing irritation which has caused her discomfort and pain. The good news is her wound culture came back negative and showed only normal skin flora and her x-ray of the left foot was also negative for any osseous change or osteomyelitis. Unfortunately she continues to have a lot of discomfort she would rate it a seven out of 10 right now. 07/26/2016 -- she is doing much better since the Iodoflex was stopped and she is on Santyl. Her culture report was negative and only had normal skin flora. She continues to smoke about a half pack of cigarettes a day. 08/05/2016 -- she is doing much better with her smoking and is down to about 5 cigarettes a day. The left lower extremity wound is healed and she continues to have a lot of tenderness on her left big toe. In the NANCYJO, GIVHAN ([id number]) course of conversation we realize she was using alcohol to clean her left big toe and we have told her to stop this. 09/16/16 on evaluation today patient appears to be doing better in regard to her left first toe wound. With that being said she is having some discomfort at the proximal aspect which is worrisome for potential infection although there is not a significant amount of erythema noted at this point. She states this is new and she has not had pain like this up to this point. Fortunately she has no red streaks upper leg and no evidence  of more extensive cellulitis. The wound is also measuring smaller. 10/04/16 on evaluation today patient appears to be doing fairly well in regard to her left first toe ulcer. She does have some Slough covering unfortunately she is not having as much discomfort she still has a little bit of discomfort at the proximal portion of the wound there is no evidence of erythema or infection. Objective Constitutional Well-nourished and well-hydrated in no acute distress. Vitals Time Taken: 3:03 PM, Height: 63 in, Weight: 224 lbs, BMI: 39.7, Temperature: 97.8 F, Pulse: 76  bpm, Respiratory Rate: 16 breaths/min, Blood Pressure: 130/66 mmHg. Respiratory normal breathing without difficulty. Psychiatric this patient is able to make decisions and demonstrates good insight into disease process. Alert and Oriented x 3. pleasant and cooperative. General Notes: Patient left first toe wound did require sharp debridement today which he tolerated fairly well without complication. Post debridement she had an excellent wound bed noted. Integumentary (Hair, Skin) Wound #3 status is Open. Original cause of wound was Gradually Appeared. The wound is located on the [employer]. The wound measures 1cm length x 0.4cm width x 0.2cm depth; 0.314cm^2 area and 0.063cm^3 volume. There is Fat Layer (Subcutaneous Tissue) Exposed exposed. There is no tunneling or undermining noted. There is a large amount of serous drainage noted. The wound margin is distinct with the outline attached to the wound base. There is small (1-33%) pink, pale granulation within the wound bed. There is a medium (34-66%) amount of necrotic tissue within the wound bed including Adherent Slough. The periwound skin appearance exhibited: Callus. The periwound skin appearance did not exhibit: Crepitus, Excoriation, Induration, Rash, Scarring, Dry/Scaly, Maceration, Atrophie Blanche, Cyanosis, Ecchymosis, Tolleson, Debby J. ([id number]) Hemosiderin  Staining, Mottled, Pallor, Rubor, Erythema. Periwound temperature was noted as No Abnormality. The periwound has tenderness on palpation. Assessment Active Problems ICD-10 E11.621 - Type 2 diabetes mellitus with foot ulcer E11.622 - Type 2 diabetes mellitus with other skin ulcer F17.218 - Nicotine dependence, cigarettes, with other nicotine-induced disorders L97.522 - Non-pressure chronic ulcer of other part of left foot with fat layer exposed Procedures Wound #3 Pre-procedure diagnosis of Wound #3 is a Diabetic Wound/Ulcer of the Lower Extremity located on the Left,Medial Toe Great .Severity of Tissue Pre Debridement is: Fat layer exposed. There was a Skin/Subcutaneous Tissue Debridement ([id number]) debridement with total area of 0.4 sq cm performed by STONE III, Canna Nickelson E., PA-C. with the following instrument(s): Curette to remove Non-Viable tissue/material including Exudate, Fat Layer (and Subcutaneous Tissue) Exposed, Fibrin/Slough, and Subcutaneous after achieving pain control using Lidocaine 4% Topical Solution. A time out was conducted at 15:21, prior to the start of the procedure. A Minimum amount of bleeding was controlled with Pressure. The procedure was tolerated well with a pain level of 3 throughout and a pain level of 3 following the procedure. Character of Wound/Ulcer Post Debridement requires further debridement. Severity of Tissue Post Debridement is: Fat layer exposed. Post procedure Diagnosis Wound #3: Same as Pre-Procedure Plan Wound Cleansing: Wound #3 Left,Medial Toe Great: Clean wound with Normal Saline. Cleanse wound with mild soap and water Anesthetic: BRENAE, LASECKI ([id number]) Wound #3 Left,Medial Toe Great: Topical Lidocaine 4% cream applied to wound bed prior to debridement Primary Wound Dressing: Wound #3 Left,Medial Toe Great: Santyl Ointment Secondary Dressing: Wound #3 Left,Medial Toe Great: Dry Gauze Gauze and Kerlix/Conform Other -  tape Dressing Change Frequency: Wound #3 Left,Medial Toe Great: Change dressing every day. Follow-up Appointments: Wound #3 Left,Medial Toe Great: Return Appointment in 1 week. Edema Control: Wound #3 Left,Medial Toe Great: Elevate legs to the level of the heart and pump ankles as often as possible Additional Orders / Instructions: Wound #3 Left,Medial Toe Great: Stop Smoking Increase protein intake. Medications-please add to medication list.: Wound #3 Left,Medial Toe Great: Santyl Enzymatic Ointment - continue General Notes: I am going to recommend that we continue with the Current wound care measures per above other than I'm discontinuing the Santyl and we are going to initiate Prisma for her. Hopefully this will do well in regard to helping to  fill in this wound and seated completely healed. If anything worsens in the interim she will contact our office for additional recommendations. Otherwise pleasure caring for the patient today. Electronic Signature(s) Signed: 10/04/2016 6:13:19 PM By: Lenda Kelp PA-C Entered By: Lenda Kelp on 10/04/2016 15:33:35 Jaquay, [name] ([id number]) -------------------------------------------------------------------------------- SuperBill Details Patient Name: Kathleen Pigg Date of Service: 10/04/2016 Medical Record Number: [id number] Patient Account Number: [id number] Date of Birth/Sex: [birth year] (66 y.o. 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 2 diabetes mellitus with foot ulcer E11.622 Type 2 diabetes mellitus with other skin ulcer F17.218 Nicotine dependence, cigarettes, with other nicotine-induced disorders L97.522 Non-pressure chronic ulcer of other part of left foot with fat layer exposed Facility Procedures CPT4  Code Description: 08657846 11042 - DEB SUBQ TISSUE 20 SQ CM/< ICD-10 Description Diagnosis L97.522 Non-pressure chronic ulcer of other part of left foot Modifier: with fat lay Quantity: 1 er exposed Physician Procedures CPT4 Code Description: [id number] 11042 - WC PHYS SUBQ TISS 20 SQ CM ICD-10 Description Diagnosis L97.522 Non-pressure chronic ulcer of other part of left foot Modifier: with fat laye Quantity: 1 r exposed Electronic Signature(s) Signed: 10/04/2016 6:13:19 PM By: Lenda Kelp PA-C Entered By: Lenda Kelp on 10/04/2016 15:33:50

## 2016-10-06 NOTE — Progress Notes (Signed)
[name] Visit Report for 10/04/2016 Arrival Information Details Patient Name: [name]. Date of Service: 10/04/2016 2:45 PM Medical Record Number: [id number] Patient Account Number: [id number] Date of Birth/Sex: May 26, 1950 (66 y.o. Female) Treating RN: Afful, RN, BSN, [place] Sink Primary Care Raidyn Wassink: Tonia Ghent Other Clinician: Referring Hokulani Rogel: Tonia Ghent Treating Cicley Ganesh/Extender: Linwood Dibbles, HOYT Weeks in Treatment: 12 Visit Information History Since Last Visit All ordered tests and consults were completed: No Patient Arrived: Cane Added or deleted any medications: No Arrival Time: 14:58 Any new allergies or adverse reactions: No Accompanied By: self Had a fall or experienced change in No Transfer Assistance: None activities of daily living that may affect Patient Identification Verified: Yes risk of falls: Secondary Verification Process Completed: Yes Signs or symptoms of abuse/neglect since last No Patient Requires Transmission-Based No visito Precautions: Hospitalized since last visit: No Patient Has Alerts: Yes Has Dressing in Place as Prescribed: Yes Patient Alerts: Dm II Pain Present Now: Yes Electronic Signature(s) Signed: 10/04/2016 4:24:08 PM By: Elpidio Eric BSN, RN Entered By: Elpidio Eric on 10/04/2016 14:59:50 Saavedra, [name] ([id number]) -------------------------------------------------------------------------------- Encounter Discharge Information Details Patient Name: [name]. Date of Service: 10/04/2016 2:45 PM Medical Record Number: [id number] Patient Account Number: [id number] Date of Birth/Sex: December 17, 1950 (66 y.o. Female) Treating RN: Afful, RN, BSN, [place] Sink Primary Care Sharlize Hoar: Tonia Ghent Other Clinician: Referring Xachary Hambly: Tonia Ghent Treating Lovelee Forner/Extender: Linwood Dibbles, HOYT Weeks in Treatment: 12 Encounter Discharge Information Items Discharge Pain Level: 0 Discharge Condition: Stable Ambulatory Status:  Cane Discharge Destination: Home Transportation: Private Auto Accompanied By: self Schedule Follow-up Appointment: No Medication Reconciliation completed No and provided to Patient/Care Tremeka Helbling: Provided on Clinical Summary of Care: 10/04/2016 Form Type Recipient Paper Patient EP Electronic Signature(s) Signed: 10/04/2016 3:35:53 PM By: Gwenlyn Perking Entered By: Gwenlyn Perking on 10/04/2016 15:35:53 [name], [name] ([id number]) -------------------------------------------------------------------------------- Lower Extremity Assessment Details Patient Name: [name]. Date of Service: 10/04/2016 2:45 PM Medical Record Number: [id number] Patient Account Number: [id number] Date of Birth/Sex: 08/02/1950 (66 y.o. Female) Treating RN: Afful, RN, BSN, [place] Sink Primary Care Burnell Matlin: Tonia Ghent Other Clinician: Referring Jaylani Mcguinn: Tonia Ghent Treating Markel Kurtenbach/Extender: Linwood Dibbles, HOYT Weeks in Treatment: 12 Vascular Assessment Claudication: Claudication Assessment [Left:None] Pulses: Dorsalis Pedis Palpable: [Left:Yes] Posterior Tibial Extremity colors, hair growth, and conditions: Extremity Color: [Left:Normal] Hair Growth on Extremity: [Left:Yes] Temperature of Extremity: [Left:Warm] Capillary Refill: [Left:< 3 seconds] Toe Nail Assessment Left: Right: Thick: Yes Discolored: Yes Deformed: No Improper Length and Hygiene: No Electronic Signature(s) Signed: 10/04/2016 4:24:08 PM By: Elpidio Eric BSN, RN Entered By: Elpidio Eric on 10/04/2016 15:00:26 Paul, [name] ([id number]) -------------------------------------------------------------------------------- Multi Wound Chart Details Patient Name: [name]. Date of Service: 10/04/2016 2:45 PM Medical Record Number: [id number] Patient Account Number: [id number] Date of Birth/Sex: [birth year] (66 y.o. Female) Treating RN: Afful, RN, BSN, [place] Sink Primary Care Drina Jobst: Tonia Ghent Other Clinician: Referring Evelynn Hench: Tonia Ghent Treating Shreena Baines/Extender: STONE III, HOYT Weeks in Treatment: 12 Vital Signs Height(in): 63 Pulse(bpm): 76 Weight(lbs): 224 Blood Pressure 130/66 (mmHg): Body Mass Index(BMI): 40 Temperature(F): 97.8 Respiratory Rate 16 (breaths/min): Photos: [3:No Photos] [N/A:N/A] Wound Location: [3:Left Toe Great - Medial] [N/A:N/A] Wounding Event: [3:Gradually Appeared] [N/A:N/A] Primary Etiology: [3:Diabetic Wound/Ulcer of the Lower Extremity] [N/A:N/A] Secondary Etiology: [3:Infection - not elsewhere classified] [N/A:N/A] Comorbid History: [3:Asthma, Chronic Obstructive Pulmonary Disease (COPD), Congestive Heart Failure, Hypertension, Type II Diabetes, Osteoarthritis, Neuropathy] [N/A:N/A] Date Acquired: [3:06/14/2016] [N/A:N/A] Weeks of Treatment: [3:12] [N/A:N/A] Wound Status: [3:Open] [N/A:N/A] Pending Amputation on Yes [N/A:N/A] Presentation: Measurements L  x W x D 1x0.4x0.2 [N/A:N/A] (cm) Area (cm) : [3:0.314] [N/A:N/A] Volume (cm) : [3:0.063] [N/A:N/A] % Reduction in Area: [3:94.70%] [N/A:N/A] % Reduction in Volume: 89.30% [N/A:N/A] Classification: [3:Grade 1] [N/A:N/A] Exudate Amount: [3:Large] [N/A:N/A] Exudate Type: [3:Serous] [N/A:N/A] Exudate Color: [3:amber] [N/A:N/A] Wound Margin: [3:Distinct, outline attached] [N/A:N/A] Granulation Amount: Small (1-33%) N/A N/A Granulation Quality: Pink, Pale N/A N/A Necrotic Amount: Medium (34-66%) N/A N/A Exposed Structures: Fat Layer (Subcutaneous N/A N/A Tissue) Exposed: Yes Fascia: No Tendon: No Muscle: No Joint: No Bone: No Epithelialization: Large (67-100%) N/A N/A Periwound Skin Texture: Callus: Yes N/A N/A Excoriation: No Induration: No Crepitus: No Rash: No Scarring: No Periwound Skin Maceration: No N/A N/A Moisture: Dry/Scaly: No Periwound Skin Color: Atrophie Blanche: No N/A N/A Cyanosis: No Ecchymosis: No Erythema: No Hemosiderin Staining: No Mottled: No Pallor: No Rubor: No Temperature: No  Abnormality N/A N/A Tenderness on Yes N/A N/A Palpation: Wound Preparation: Ulcer Cleansing: N/A N/A Rinsed/Irrigated with Saline Topical Anesthetic Applied: Other: lidocaine 4% Treatment Notes Electronic Signature(s) Signed: 10/04/2016 4:24:08 PM By: Elpidio EricAfful, Rita BSN, RN Entered By: Elpidio EricAfful, Rita on 10/04/2016 15:21:23 Cedotal, [name] ([id number]) -------------------------------------------------------------------------------- Multi-Disciplinary Care Plan Details Patient Name: [name], [name] Date of Service: 10/04/2016 2:45 PM Medical Record Number: [id number] Patient Account Number: [id number] Date of Birth/Sex: November 14, 1950 (66 y.o. Female) Treating RN: Afful, RN, BSN, [employer] Primary Care Cici Rodriges: Tonia GhentBENDER, ABBY Other Clinician: Referring Soma Lizak: Tonia GhentBENDER, ABBY Treating Ayisha Pol/Extender: Linwood DibblesSTONE III, HOYT Weeks in Treatment: 12 Active Inactive ` Abuse / Safety / Falls / Self Care Management Nursing Diagnoses: Potential for falls Goals: Patient will remain injury free Date Initiated: 07/12/2016 Target Resolution Date: 10/26/2016 Goal Status: Active Interventions: Assess fall risk on admission and as needed Assess impairment of mobility on admission and as needed per policy Notes: ` Nutrition Nursing Diagnoses: Imbalanced nutrition Impaired glucose control: actual or potential Potential for alteratiion in Nutrition/Potential for imbalanced nutrition Goals: Patient/caregiver agrees to and verbalizes understanding of need to use nutritional supplements and/or vitamins as prescribed Date Initiated: 07/12/2016 Target Resolution Date: 09/28/2016 Goal Status: Active Interventions: Assess patient nutrition upon admission and as needed per policy Provide education on elevated blood sugars and impact on wound healing Notes: [name]` Cianci, Sharday J. ([id number]) Orientation to the Wound Care Program Nursing Diagnoses: Knowledge deficit related to the wound healing center  program Goals: Patient/caregiver will verbalize understanding of the Wound Healing Center Program Date Initiated: 07/12/2016 Target Resolution Date: 07/27/2016 Goal Status: Active Interventions: Provide education on orientation to the wound center Notes: ` Pain, Acute or Chronic Nursing Diagnoses: Pain, acute or chronic: actual or potential Potential alteration in comfort, pain Goals: Patient/caregiver will verbalize adequate pain control between visits Date Initiated: 07/12/2016 Target Resolution Date: 10/26/2016 Goal Status: Active Interventions: Assess comfort goal upon admission Complete pain assessment as per visit requirements Notes: ` Soft Tissue Infection Nursing Diagnoses: Impaired tissue integrity Knowledge deficit related to disease process and management Knowledge deficit related to home infection control: handwashing, handling of soiled dressings, supply storage Goals: Patient/caregiver will verbalize understanding of or measures to prevent infection and contamination in the home setting Date Initiated: 07/12/2016 Target Resolution Date: 09/28/2016 Goal Status: Active [name], [name] ([id number]) Interventions: Assess signs and symptoms of infection every visit Notes: ` Wound/Skin Impairment Nursing Diagnoses: Impaired tissue integrity Knowledge deficit related to smoking impact on wound healing Knowledge deficit related to ulceration/compromised skin integrity Goals: Ulcer/skin breakdown will have a volume reduction of 80% by week 12 Date Initiated: 07/12/2016 Target Resolution Date: 10/26/2016 Goal Status: Active  Interventions: Assess patient/caregiver ability to perform ulcer/skin care regimen upon admission and as needed Assess ulceration(s) every visit Provide education on smoking Notes: Electronic Signature(s) Signed: 10/04/2016 4:24:08 PM By: Elpidio Eric BSN, RN Entered By: Elpidio Eric on 10/04/2016 15:21:13 Ketner, [name]  ([id number]) -------------------------------------------------------------------------------- Pain Assessment Details Patient Name: [name]. Date of Service: 10/04/2016 2:45 PM Medical Record Number: [id number] Patient Account Number: [id number] Date of Birth/Sex: Aug 19, 1950 (66 y.o. Female) Treating RN: Afful, RN, BSN, [place] Sink Primary Care Lela Gell: Tonia Ghent Other Clinician: Referring Cherika Jessie: Tonia Ghent Treating Jeffry Vogelsang/Extender: Linwood Dibbles, HOYT Weeks in Treatment: 12 Active Problems Location of Pain Severity and Description of Pain Patient Has Paino Yes Site Locations Pain Location: Pain in Ulcers Pain Management and Medication Current Pain Management: Electronic Signature(s) Signed: 10/04/2016 4:24:08 PM By: Elpidio Eric BSN, RN Entered By: Elpidio Eric on 10/04/2016 15:00:04 Kiester, [name] ([id number]) -------------------------------------------------------------------------------- Wound Assessment Details Patient Name: [name]. Date of Service: 10/04/2016 2:45 PM Medical Record Number: [id number] Patient Account Number: [id number] Date of Birth/Sex: 1951-01-26 (66 y.o. Female) Treating RN: Afful, RN, BSN, Psychologist, clinical Primary Care Nohealani Medinger: Tonia Ghent Other Clinician: Referring Gerarda Conklin: Tonia Ghent Treating Zyler Hyson/Extender: STONE III, HOYT Weeks in Treatment: 12 Wound Status Wound Number: 3 Primary Diabetic Wound/Ulcer of the Lower Etiology: Extremity Wound Location: Left Toe Great - Medial Secondary Infection - not elsewhere classified Wounding Event: Gradually Appeared Etiology: Date Acquired: 06/14/2016 Wound Open Weeks Of Treatment: 12 Status: Clustered Wound: No Comorbid Asthma, Chronic Obstructive Pending Amputation On Presentation History: Pulmonary Disease (COPD), Congestive Heart Failure, Hypertension, Type II Diabetes, Osteoarthritis, Neuropathy Photos Photo Uploaded By: Elpidio Eric on 10/04/2016 16:27:36 Wound Measurements Length: (cm)  1 Width: (cm) 0.4 Depth: (cm) 0.2 Area: (cm) 0.314 Volume: (cm) 0.063 % Reduction in Area: 94.7% % Reduction in Volume: 89.3% Epithelialization: Large (67-100%) Tunneling: No Undermining: No Wound Description Classification: Grade 1 Foul Odor Aft Wound Margin: Distinct, outline attached Slough/Fibrin Exudate Amount: Large Exudate Type: Serous Exudate Color: amber er Cleansing: No o Yes Wound Bed Dantonio, [name] ([id number]) Granulation Amount: Small (1-33%) Exposed Structure Granulation Quality: Pink, Pale Fascia Exposed: No Necrotic Amount: Medium (34-66%) Fat Layer (Subcutaneous Tissue) Exposed: Yes Necrotic Quality: Adherent Slough Tendon Exposed: No Muscle Exposed: No Joint Exposed: No Bone Exposed: No Periwound Skin Texture Texture Color No Abnormalities Noted: No No Abnormalities Noted: No Callus: Yes Atrophie Blanche: No Crepitus: No Cyanosis: No Excoriation: No Ecchymosis: No Induration: No Erythema: No Rash: No Hemosiderin Staining: No Scarring: No Mottled: No Pallor: No Moisture Rubor: No No Abnormalities Noted: No Dry / Scaly: No Temperature / Pain Maceration: No Temperature: No Abnormality Tenderness on Palpation: Yes Wound Preparation Ulcer Cleansing: Rinsed/Irrigated with Saline Topical Anesthetic Applied: Other: lidocaine 4%, Treatment Notes Wound #3 (Left, Medial Toe Great) 1. Cleansed with: Clean wound with Normal Saline 4. Dressing Applied: Santyl Ointment 5. Secondary Dressing Applied Gauze and Kerlix/Conform Notes darco shoe Electronic Signature(s) Signed: 10/04/2016 4:24:08 PM By: Elpidio Eric BSN, RN Entered By: Elpidio Eric on 10/04/2016 15:04:20 [name], [name] ([id number]) -------------------------------------------------------------------------------- Vitals Details Patient Name: [name]. Date of Service: 10/04/2016 2:45 PM Medical Record Number: [id number] Patient Account Number: [id number] Date of Birth/Sex:  1950-09-22 (66 y.o. Female) Treating RN: Afful, RN, BSN, [employer] Primary Care Jochebed Bills: Tonia Ghent Other Clinician: Referring Aarvi Stotts: Tonia Ghent Treating Shelly Spenser/Extender: STONE III, HOYT Weeks in Treatment: 12 Vital Signs Time Taken: 15:03 Temperature (F): 97.8 Height (in): 63 Pulse (bpm): 76 Weight (lbs): 224 Respiratory Rate (breaths/min): 16 Body Mass Index (  BMI): 39.7 Blood Pressure (mmHg): 130/66 Reference Range: 80 - 120 mg / dl Electronic Signature(s) Signed: 10/04/2016 4:24:08 PM By: Elpidio Eric BSN, RN Entered By: Elpidio Eric on 10/04/2016 15:20:57

## 2016-10-11 ENCOUNTER — Encounter: Payer: Medicare HMO | Admitting: Surgery

## 2016-10-11 DIAGNOSIS — E11621 Type 2 diabetes mellitus with foot ulcer: Secondary | ICD-10-CM | POA: Diagnosis not present

## 2016-10-12 NOTE — Progress Notes (Signed)
[name], Aleera J. ([id number]) Visit Report for 10/11/2016 Arrival Information Details Patient Name: [name], Zennie J. Date of Service: 10/11/2016 9:15 AM Medical Record Number: [id number] Patient Account Number: [id number] Date of Birth/Sex: [birth year] (66 y.o. Female) Treating RN: Afful, RN, BSN, [place] [facility] Primary Care Boykin Baetz: Tonia GhentBENDER, ABBY Other Clinician: Referring Wessie Shanks: Tonia GhentBENDER, ABBY Treating Zianna Dercole/Extender: Rudene ReBritto, Errol Weeks in Treatment: 13 Visit Information History Since Last Visit All ordered tests and consults were No Patient Arrived: Cane completed: Arrival Time: 09:17 Added or deleted any medications: No Accompanied By: self Any new allergies or adverse reactions: No Transfer Assistance: None Had a fall or experienced change in No Patient Identification Verified: Yes activities of daily living that may affect Secondary Verification Process Completed: Yes risk of falls: Patient Requires Transmission-Based No Signs or symptoms of abuse/neglect since last No Precautions: visito Patient Has Alerts: Yes Hospitalized since last visit: No Patient Alerts: Dm II Has Dressing in Place as Prescribed: Yes Has Footwear/Offloading in Place as Yes Prescribed: Right: Wedge Shoe Pain Present Now: No Electronic Signature(s) Signed: 10/11/2016 3:47:55 PM By: Elpidio EricAfful, Rita BSN, RN Entered By: Elpidio EricAfful, Rita on 10/11/2016 09:18:25 Voland, [name] ([id number]) -------------------------------------------------------------------------------- Encounter Discharge Information Details Patient Name: Josepha PiggPARRIS, Jenesa J. Date of Service: 10/11/2016 9:15 AM Medical Record Number: [id number] Patient Account Number: [id number] Date of Birth/Sex: [birth year] (66 y.o. Female) Treating RN: Afful, RN, BSN, [place] [facility]inkita Primary Care Mikaella Escalona: Tonia GhentBENDER, ABBY Other Clinician: Referring Anjelika Ausburn: Tonia GhentBENDER, ABBY Treating Leaha Cuervo/Extender: Rudene ReBritto, Errol Weeks in Treatment: 13 Encounter Discharge Information  Items Discharge Pain Level: 0 Discharge Condition: Stable Ambulatory Status: Cane Discharge Destination: Home Transportation: Private Auto Schedule Follow-up Appointment: No Medication Reconciliation completed No and provided to Patient/Care Tarun Patchell: Provided on Clinical Summary of Care: 10/11/2016 Form Type Recipient Paper Patient EP Electronic Signature(s) Signed: 10/11/2016 3:47:55 PM By: Elpidio EricAfful, Rita BSN, RN Previous Signature: 10/11/2016 9:40:22 AM Version By: Gwenlyn PerkingMoore, Shelia Entered By: Elpidio EricAfful, Rita on 10/11/2016 09:42:21 Spindler, Gael Shela CommonsJ. ([id number]) -------------------------------------------------------------------------------- Lower Extremity Assessment Details Patient Name: Josepha PiggPARRIS, Tensley J. Date of Service: 10/11/2016 9:15 AM Medical Record Number: [id number] Patient Account Number: [id number] Date of Birth/Sex: [birth year] (66 y.o. Female) Treating RN: Afful, RN, BSN, [place] Sinkita Primary Care Jessah Danser: Tonia GhentBENDER, ABBY Other Clinician: Referring Latonyia Lopata: Tonia GhentBENDER, ABBY Treating Landen Knoedler/Extender: Rudene ReBritto, Errol Weeks in Treatment: 13 Vascular Assessment Claudication: Claudication Assessment [Left:None] Pulses: Dorsalis Pedis Palpable: [Left:Yes] Posterior Tibial Extremity colors, hair growth, and conditions: Extremity Color: [Left:Normal] Hair Growth on Extremity: [Left:Yes] Temperature of Extremity: [Left:Warm] Capillary Refill: [Left:< 3 seconds] Toe Nail Assessment Left: Right: Thick: Yes Discolored: Yes Deformed: No Improper Length and Hygiene: No Electronic Signature(s) Signed: 10/11/2016 3:47:55 PM By: Elpidio EricAfful, Rita BSN, RN Entered By: Elpidio EricAfful, Rita on 10/11/2016 09:18:51 Cormier, Ratasha J. ([id number]) -------------------------------------------------------------------------------- Multi Wound Chart Details Patient Name: Josepha PiggPARRIS, Tilla J. Date of Service: 10/11/2016 9:15 AM Medical Record Number: [id number] Patient Account Number: [id number] Date of Birth/Sex: [birth year] (66  y.o. Female) Treating RN: Afful, RN, BSN, [place] [facility] Primary Care Nikayla Madaris: Tonia GhentBENDER, ABBY Other Clinician: Referring Ammara Raj: Tonia GhentBENDER, ABBY Treating Lannette Avellino/Extender: Rudene ReBritto, Errol Weeks in Treatment: 13 Vital Signs Height(in): 63 Pulse(bpm): 100 Weight(lbs): 224 Blood Pressure 160/102 (mmHg): Body Mass Index(BMI): 40 Temperature(F): 98.4 Respiratory Rate 16 (breaths/min): Photos: [3:No Photos] [N/A:N/A] Wound Location: [3:Left, Medial Toe Great] [N/A:N/A] Wounding Event: [3:Gradually Appeared] [N/A:N/A] Primary Etiology: [3:Diabetic Wound/Ulcer of the Lower Extremity] [N/A:N/A] Secondary Etiology: [3:Infection - not elsewhere classified] [N/A:N/A] Comorbid History: [3:Asthma, Chronic Obstructive Pulmonary Disease (COPD), Congestive Heart Failure, Hypertension, Type II Diabetes, Osteoarthritis, Neuropathy] [N/A:N/A] Date Acquired: [3:06/14/2016] [N/A:N/A] Weeks of Treatment: [3:13] [  N/A:N/A] Wound Status: [3:Open] [N/A:N/A] Pending Amputation on Yes [N/A:N/A] Presentation: Measurements L x W x D 1.1x0.4x0.2 [N/A:N/A] (cm) Area (cm) : [3:0.346] [N/A:N/A] Volume (cm) : [3:0.069] [N/A:N/A] % Reduction in Area: [3:94.10%] [N/A:N/A] % Reduction in Volume: 88.30% [N/A:N/A] Classification: [3:Grade 1] [N/A:N/A] Exudate Amount: [3:Medium] [N/A:N/A] Exudate Type: [3:Serous] [N/A:N/A] Exudate Color: [3:amber] [N/A:N/A] Wound Margin: [3:Distinct, outline attached] [N/A:N/A] Granulation Amount: Small (1-33%) N/A N/A Granulation Quality: Pink, Pale N/A N/A Necrotic Amount: Small (1-33%) N/A N/A Exposed Structures: Fat Layer (Subcutaneous N/A N/A Tissue) Exposed: Yes Fascia: No Tendon: No Muscle: No Joint: No Bone: No Epithelialization: Large (67-100%) N/A N/A Debridement: Debridement (16109- N/A N/A 11047) Pre-procedure 09:31 N/A N/A Verification/Time Out Taken: Pain Control: Lidocaine 4% Topical N/A N/A Solution Tissue Debrided: Fibrin/Slough, Fat, N/A N/A Exudates,  Subcutaneous Level: Skin/Subcutaneous N/A N/A Tissue Debridement Area (sq 0.24 N/A N/A cm): Instrument: Curette N/A N/A Bleeding: Minimum N/A N/A Hemostasis Achieved: Pressure N/A N/A Procedural Pain: 2 N/A N/A Post Procedural Pain: 2 N/A N/A Debridement Treatment Procedure was tolerated N/A N/A Response: well Post Debridement 0.8x0.3x0.2 N/A N/A Measurements L x W x D (cm) Post Debridement 0.038 N/A N/A Volume: (cm) Periwound Skin Texture: Callus: Yes N/A N/A Excoriation: No Induration: No Crepitus: No Rash: No Scarring: No Periwound Skin Maceration: No N/A N/A Moisture: Dry/Scaly: No Periwound Skin Color: Atrophie Blanche: No N/A N/A Cyanosis: No Ecchymosis: No Erythema: No Hemosiderin Staining: No Mottled: No Rash, Garielle J. ([id number]) Pallor: No Rubor: No Temperature: No Abnormality N/A N/A Tenderness on Yes N/A N/A Palpation: Wound Preparation: Ulcer Cleansing: N/A N/A Rinsed/Irrigated with Saline Topical Anesthetic Applied: Other: lidocaine 4% Procedures Performed: Debridement N/A N/A Treatment Notes Wound #3 (Left, Medial Toe Great) 1. Cleansed with: Clean wound with Normal Saline 4. Dressing Applied: Santyl Ointment 5. Secondary Dressing Applied Gauze and Kerlix/Conform 6. Footwear/Offloading device applied Other footwear/offloading device applied (specify in notes) 7. Secured with Tape Notes darco Engineering geologist) Signed: 10/11/2016 10:00:22 AM By: Evlyn Kanner MD, FACS Entered By: Evlyn Kanner on 10/11/2016 10:00:22 Bhatt, [name] ([id number]) -------------------------------------------------------------------------------- Multi-Disciplinary Care Plan Details Patient Name: AKEEMA, BRODER. Date of Service: 10/11/2016 9:15 AM Medical Record Number: [id number] Patient Account Number: [id number] Date of Birth/Sex: May 15, 1950 (66 y.o. Female) Treating RN: Afful, RN, BSN, [employer] Primary Care Eva Griffo: Tonia Ghent Other  Clinician: Referring Jered Heiny: Tonia Ghent Treating Kena Limon/Extender: Rudene Re in Treatment: 13 Active Inactive ` Abuse / Safety / Falls / Self Care Management Nursing Diagnoses: Potential for falls Goals: Patient will remain injury free Date Initiated: 07/12/2016 Target Resolution Date: 10/26/2016 Goal Status: Active Interventions: Assess fall risk on admission and as needed Assess impairment of mobility on admission and as needed per policy Notes: ` Nutrition Nursing Diagnoses: Imbalanced nutrition Impaired glucose control: actual or potential Potential for alteratiion in Nutrition/Potential for imbalanced nutrition Goals: Patient/caregiver agrees to and verbalizes understanding of need to use nutritional supplements and/or vitamins as prescribed Date Initiated: 07/12/2016 Target Resolution Date: 09/28/2016 Goal Status: Active Interventions: Assess patient nutrition upon admission and as needed per policy Provide education on elevated blood sugars and impact on wound healing Notes: KADIA, ABAYA ([id number]) Orientation to the Wound Care Program Nursing Diagnoses: Knowledge deficit related to the wound healing center program Goals: Patient/caregiver will verbalize understanding of the Wound Healing Center Program Date Initiated: 07/12/2016 Target Resolution Date: 07/27/2016 Goal Status: Active Interventions: Provide education on orientation to the wound center Notes: ` Pain, Acute or Chronic Nursing Diagnoses: Pain, acute or chronic: actual or  potential Potential alteration in comfort, pain Goals: Patient/caregiver will verbalize adequate pain control between visits Date Initiated: 07/12/2016 Target Resolution Date: 10/26/2016 Goal Status: Active Interventions: Assess comfort goal upon admission Complete pain assessment as per visit requirements Notes: ` Soft Tissue Infection Nursing Diagnoses: Impaired tissue integrity Knowledge deficit related  to disease process and management Knowledge deficit related to home infection control: handwashing, handling of soiled dressings, supply storage Goals: Patient/caregiver will verbalize understanding of or measures to prevent infection and contamination in the home setting Date Initiated: 07/12/2016 Target Resolution Date: 09/28/2016 Goal Status: Active LISBET, BUSKER ([id number]) Interventions: Assess signs and symptoms of infection every visit Notes: ` Wound/Skin Impairment Nursing Diagnoses: Impaired tissue integrity Knowledge deficit related to smoking impact on wound healing Knowledge deficit related to ulceration/compromised skin integrity Goals: Ulcer/skin breakdown will have a volume reduction of 80% by week 12 Date Initiated: 07/12/2016 Target Resolution Date: 10/26/2016 Goal Status: Active Interventions: Assess patient/caregiver ability to perform ulcer/skin care regimen upon admission and as needed Assess ulceration(s) every visit Provide education on smoking Notes: Electronic Signature(s) Signed: 10/11/2016 3:47:55 PM By: Elpidio Eric BSN, RN Entered By: Elpidio Eric on 10/11/2016 09:30:32 Kari, [name] ([id number]) -------------------------------------------------------------------------------- Pain Assessment Details Patient Name: [name]. Date of Service: 10/11/2016 9:15 AM Medical Record Number: [id number] Patient Account Number: [id number] Date of Birth/Sex: Mar 27, 1950 (66 y.o. Female) Treating RN: Afful, RN, BSN, [place] [facility] Primary Care Jese Comella: Tonia Ghent Other Clinician: Referring Kelyn Ponciano: Tonia Ghent Treating Aryel Edelen/Extender: Rudene Re in Treatment: 13 Active Problems Location of Pain Severity and Description of Pain Patient Has Paino No Site Locations With Dressing Change: No Pain Management and Medication Current Pain Management: Electronic Signature(s) Signed: 10/11/2016 3:47:55 PM By: Elpidio Eric BSN, RN Entered By: Elpidio Eric on  10/11/2016 09:18:32 Oehler, [name] ([id number]) -------------------------------------------------------------------------------- Patient/Caregiver Education Details Patient Name: [name]. Date of Service: 10/11/2016 9:15 AM Medical Record Number: [id number] Patient Account Number: [id number] Date of Birth/Gender: Mar 13, 1951 (66 y.o. Female) Treating RN: Afful, RN, BSN, [place] [facility] Primary Care Physician: Tonia Ghent Other Clinician: Referring Physician: Tonia Ghent Treating Physician/Extender: Rudene Re in Treatment: 13 Education Assessment Education Provided To: Patient Education Topics Provided Elevated Blood Sugar/ Impact on Healing: Methods: Explain/Verbal Responses: State content correctly Smoking and Wound Healing: Methods: Explain/Verbal Responses: State content correctly Welcome To The Wound Care Center: Methods: Explain/Verbal Responses: Reinforcements needed Wound Debridement: Methods: Explain/Verbal Responses: Reinforcements needed Wound/Skin Impairment: Methods: Explain/Verbal Responses: Reinforcements needed Electronic Signature(s) Signed: 10/11/2016 3:47:55 PM By: Elpidio Eric BSN, RN Entered By: Elpidio Eric on 10/11/2016 09:42:49 Righter, Roselind J. ([id number]) -------------------------------------------------------------------------------- Wound Assessment Details Patient Name: [name]. Date of Service: 10/11/2016 9:15 AM Medical Record Number: [id number] Patient Account Number: [id number] Date of Birth/Sex: [birth year] (66 y.o. Female) Treating RN: Afful, RN, BSN, Psychologist, clinical Primary Care Humberto Addo: Tonia Ghent Other Clinician: Referring Oceana Walthall: Tonia Ghent Treating Anahi Belmar/Extender: Rudene Re in Treatment: 13 Wound Status Wound Number: 3 Primary Diabetic Wound/Ulcer of the Lower Etiology: Extremity Wound Location: Left, Medial Toe Great Secondary Infection - not elsewhere classified Wounding Event: Gradually  Appeared Etiology: Date Acquired: 06/14/2016 Wound Open Weeks Of Treatment: 13 Status: Clustered Wound: No Comorbid Asthma, Chronic Obstructive Pending Amputation On Presentation History: Pulmonary Disease (COPD), Congestive Heart Failure, Hypertension, Type II Diabetes, Osteoarthritis, Neuropathy Photos Photo Uploaded By: Elpidio Eric on 10/11/2016 15:53:58 Wound Measurements Length: (cm) 1.1 Width: (cm) 0.4 Depth: (cm) 0.2 Area: (cm) 0.346 Volume: (cm) 0.069 % Reduction in Area: 94.1% % Reduction in Volume: 88.3% Epithelialization: Large (  67-100%) Tunneling: No Undermining: No Wound Description Classification: Grade 1 Foul Odor Aft Wound Margin: Distinct, outline attached Slough/Fibrin Exudate Amount: Medium Exudate Type: Serous Exudate Color: amber er Cleansing: No o Yes Wound Bed Bennis, Chloeann J. ([id number]) Granulation Amount: Small (1-33%) Exposed Structure Granulation Quality: Pink, Pale Fascia Exposed: No Necrotic Amount: Small (1-33%) Fat Layer (Subcutaneous Tissue) Exposed: Yes Necrotic Quality: Adherent Slough Tendon Exposed: No Muscle Exposed: No Joint Exposed: No Bone Exposed: No Periwound Skin Texture Texture Color No Abnormalities Noted: No No Abnormalities Noted: No Callus: Yes Atrophie Blanche: No Crepitus: No Cyanosis: No Excoriation: No Ecchymosis: No Induration: No Erythema: No Rash: No Hemosiderin Staining: No Scarring: No Mottled: No Pallor: No Moisture Rubor: No No Abnormalities Noted: No Dry / Scaly: No Temperature / Pain Maceration: No Temperature: No Abnormality Tenderness on Palpation: Yes Wound Preparation Ulcer Cleansing: Rinsed/Irrigated with Saline Topical Anesthetic Applied: Other: lidocaine 4%, Treatment Notes Wound #3 (Left, Medial Toe Great) 1. Cleansed with: Clean wound with Normal Saline 4. Dressing Applied: Santyl Ointment 5. Secondary Dressing Applied Gauze and Kerlix/Conform 6.  Footwear/Offloading device applied Other footwear/offloading device applied (specify in notes) 7. 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 Date of Service: 10/11/2016 9:15 AM Medical Record Number: [id number] Patient Account Number: [id number] Date of Birth/Sex: February 12, 1951 (66 y.o. Female) Treating RN: Afful, RN, BSN, [place] [facility] Primary Care Aquita Simmering: Tonia Ghent Other Clinician: Referring Berel Najjar: Tonia Ghent Treating Alesia Oshields/Extender: Rudene Re in Treatment: 13 Vital Signs Time Taken: 09:18 Temperature (F): 98.4 Height (in): 63 Pulse (bpm): 100 Weight (lbs): 224 Respiratory Rate (breaths/min): 16 Body Mass Index (BMI): 39.7 Blood Pressure (mmHg): 160/102 Reference Range: 80 - 120 mg / dl Notes MD made aware of high BP. Patient says she have not take any of her BP meds Electronic Signature(s) Signed: 10/11/2016 3:47:55 PM By: Elpidio Eric BSN, RN Entered By: Elpidio Eric on 10/11/2016 01:02:72

## 2016-10-14 NOTE — Progress Notes (Signed)
Kathleen, [name] ([id number]) Visit Report for 10/11/2016 Chief Complaint Document Details Patient Name: Kathleen, [name]. Date of Service: 10/11/2016 9:15 AM Medical Record Number: [id number] Patient Account Number: [id number] Date of Birth/Sex: 1951-03-06 (66 y.o. Female) Treating RN: Afful, RN, BSN, [name] Primary Care Provider: Tonia [name] Other Clinician: Referring Provider: Tonia [name] Treating Provider/Extender: [name] in Treatment: 13 Information Obtained from: Patient Chief Complaint [name] presents today for evaluation of her left medial hallux and left lateral lower extremity ulcers Electronic Signature(s) Signed: 10/11/2016 10:01:29 AM By: Kathleen Kanner MD, FACS Entered By: [name] on 10/11/2016 10:01:29 [name], [name] ([id number]) -------------------------------------------------------------------------------- Debridement Details Patient Name: [name]. Date of Service: 10/11/2016 9:15 AM Medical Record Number: [id number] Patient Account Number: [id number] Date of Birth/Sex: 1950-08-05 (66 y.o. Female) Treating RN: Afful, RN, BSN, [place] [name] Primary Care Provider: Tonia [name] Other Clinician: Referring Provider: Tonia [name] Treating Provider/Extender: [name] in Treatment: 13 Debridement Performed for Wound #3 Left,Medial Toe Great Assessment: Performed By: Physician Kathleen Kanner, MD Debridement: Debridement Severity of Tissue Pre Fat layer exposed Debridement: Pre-procedure Verification/Time Out Yes - 09:31 Taken: Start Time: 09:31 Pain Control: Lidocaine 4% Topical Solution Level: Skin/Subcutaneous Tissue Total Area Debrided (L x 0.8 (cm) x 0.3 (cm) = 0.24 (cm) W): Tissue and other Non-Viable, Exudate, Fat, Fibrin/Slough, Subcutaneous material debrided: Instrument: Curette Bleeding: Minimum Hemostasis Achieved: Pressure End Time: 09:31 Procedural Pain: 2 Post Procedural Pain: 2 Response to Treatment: Procedure was  tolerated well Post Debridement Measurements of Total Wound Length: (cm) 0.8 Width: (cm) 0.3 Depth: (cm) 0.2 Volume: (cm) 0.038 Character of Wound/Ulcer Post Stable Debridement: Severity of Tissue Post Debridement: Fat layer exposed Post Procedure Diagnosis Same as Pre-procedure Electronic Signature(s) Signed: 10/11/2016 10:01:21 AM By: Kathleen Kanner MD, FACS Signed: 10/11/2016 3:47:55 PM By: [name] BSN, RN [name], [name] ([id number]) Entered By: [name] on 10/11/2016 10:01:20 [name], [name] ([id number]) -------------------------------------------------------------------------------- HPI Details Patient Name: [name]. Date of Service: 10/11/2016 9:15 AM Medical Record Number: [id number] Patient Account Number: [id number] Date of Birth/Sex: Feb 05, 1951 (66 y.o. Female) Treating RN: Afful, RN, BSN, Psychologist, clinical Primary Care Provider: Tonia [name] Other Clinician: Referring Provider: Tonia [name] Treating Provider/Extender: [name] in Treatment: 13 History of Present Illness Location: left medial hallux; LLE lateral Duration: left medial hallux-4 weeks; LLE lateral-2-1/2 weeks Timing: left medial hallux-pain with pressure/standing, ambulating, manipulation; LLE lateral-pain with manipulation Context: left medial hallux-originally was a crack in the skin; LLE lateral-traumatic injury with car door Modifying Factors: uncontrolled diabetes and smoking are exacerbating factors to wound healing HPI Description: 07/12/16- the patient arrives for initial evaluation of a left hallux and left lower extremity lateral ulcer. She states that the left hallux originally had a crack in the skin which she began to apply AandD ointment. She went to her PCP at [facility] on 4/4, while going into her appointment she struck her left lateral leg on the car door. At that appointment she was given a prescription for Bactrim and completed that on 4/14 with no adverse  reaction. She states that it made little to no improvement in her toe wound. She went back for a follow-up visit on 4/18 and was prescribed doxycycline for 10 days. She continues to apply AandD topical ointment along with soaking her foot in warm water. She is a diabetic, most recent A1c of 9% (I do not have records of this, this is per patient report). She is both on oral agents and insulin. She  is a current smoker, approximately 0.5 ppd. She denies any remote or recent evaluation regarding arterial or venous disease. 07/19/16 Patient presents today for follow-up evaluation concerning her left great toe wound immediately and left lateral lower extremity wound. Unfortunately though both wounds appear better visually she is having a lot of discomfort. I'm unsure if this is a preformed drop of the issue or potentially the ideas or appointment is causing irritation which has caused her discomfort and pain. The good news is her wound culture came back negative and showed only normal skin flora and her x-ray of the left foot was also negative for any osseous change or osteomyelitis. Unfortunately she continues to have a lot of discomfort she would rate it a seven out of 10 right now. 07/26/2016 -- she is doing much better since the Iodoflex was stopped and she is on Santyl. Her culture report was negative and only had normal skin flora. She continues to smoke about a half pack of cigarettes a day. 08/05/2016 -- she is doing much better with her smoking and is down to about 5 cigarettes a day. The left lower extremity wound is healed and she continues to have a lot of tenderness on her left big toe. In the course of conversation we realize she was using alcohol to clean her left big toe and we have told her to stop this. 09/16/16 on evaluation today patient appears to be doing better in regard to her left first toe wound. With that being said she is having some discomfort at the proximal aspect which is  worrisome for potential infection although there is not a significant amount of erythema noted at this point. She states this is new and she has not had pain like this up to this point. Fortunately she has no red streaks upper leg and no evidence of more extensive cellulitis. The wound is also measuring smaller. Kathleen, [name] ([id number]) 10/04/16 on evaluation today patient appears to be doing fairly well in regard to her left first toe ulcer. She does have some Slough covering unfortunately she is not having as much discomfort she still has a little bit of discomfort at the proximal portion of the wound there is no evidence of erythema or infection. 10/11/2016 -- she says she has completely given up smoking for the last 2 days and I have commended her on this Electronic Signature(s) Signed: 10/11/2016 10:01:51 AM By: Kathleen Kanner MD, FACS Entered By: [name] on 10/11/2016 10:01:51 [name], [name] ([id number]) -------------------------------------------------------------------------------- Physical Exam Details Patient Name: JOLANDA, MCCANN. Date of Service: 10/11/2016 9:15 AM Medical Record Number: [id number] Patient Account Number: [id number] Date of Birth/Sex: 10-Sep-1950 (66 y.o. Female) Treating RN: Afful, RN, BSN, [place] [name] Primary Care Provider: Tonia [name] Other Clinician: Referring Provider: Tonia [name] Treating Provider/Extender: [name] in Treatment: 13 Constitutional . Pulse regular. Respirations normal and unlabored. Afebrile. . Eyes Nonicteric. Reactive to light. Ears, Nose, Mouth, and Throat Lips, teeth, and gums WNL.Marland Kitchen Moist mucosa without lesions. Neck supple and nontender. No palpable supraclavicular or cervical adenopathy. Normal sized without goiter. Respiratory WNL. No retractions.. Breath sounds WNL, No rubs, rales, rhonchi, or wheeze.. Cardiovascular Heart rhythm and rate regular, no murmur or gallop.. Pedal Pulses WNL. No clubbing, cyanosis or  edema. Chest Breasts symmetical and no nipple discharge.. Breast tissue WNL, no masses, lumps, or tenderness.. Lymphatic No adneopathy. No adenopathy. No adenopathy. Musculoskeletal Adexa without tenderness or enlargement.. Digits and nails w/o clubbing, cyanosis, infection, petechiae, ischemia, or inflammatory conditions.Marland Kitchen  Integumentary (Hair, Skin) No suspicious lesions. No crepitus or fluctuance. No peri-wound warmth or erythema. No masses.Marland Kitchen Psychiatric Judgement and insight Intact.. No evidence of depression, anxiety, or agitation.. Notes the wound on the left medial toe continues to have a lot of slough and surrounding eschar and with a #3 curet I sharply removed and freshened all the edges and also cleansed out the base of the wound which still would benefit with Santyl ointment Electronic Signature(s) Signed: 10/11/2016 10:02:35 AM By: Kathleen Kanner MD, FACS Entered By: [name] on 10/11/2016 10:02:34 [name], [name] -------------------------------------------------------------------------------- Physician Orders Details Patient Name: [name]. Date of Service: 10/11/2016 9:15 AM Medical Record Number: [id number] Patient Account Number: [id number] Date of Birth/Sex: December 16, 1950 (66 y.o. Female) Treating RN: Afful, RN, BSN, [place] [name] Primary Care Provider: Tonia [name] Other Clinician: Referring Provider: Tonia [name] Treating Provider/Extender: [name] in Treatment: 13 Verbal / Phone Orders: No Diagnosis Coding Wound Cleansing Wound #3 Left,Medial Toe Great o Clean wound with Normal Saline. o Cleanse wound with mild soap and water Anesthetic Wound #3 Left,Medial Toe Great o Topical Lidocaine 4% cream applied to wound bed prior to debridement Primary Wound Dressing Wound #3 Left,Medial Toe Great o Santyl Ointment Secondary Dressing Wound #3 Left,Medial Toe Great o Dry Gauze o Gauze and Kerlix/Conform o Other - tape Dressing  Change Frequency Wound #3 Left,Medial Toe Great o Change dressing every day. Follow-up Appointments Wound #3 Left,Medial Toe Great o Return Appointment in 1 week. Edema Control Wound #3 Left,Medial Toe Great o Elevate legs to the level of the heart and pump ankles as often as possible Additional Orders / Instructions Wound #3 Left,Medial Toe Great o Stop Smoking o Increase protein intake. Kathleen, [name] ([id number]) Medications-please add to medication list. Wound #3 Left,Medial Toe Great o Santyl Enzymatic Ointment - continue Electronic Signature(s) Signed: 10/11/2016 3:47:55 PM By: [name] BSN, RN Signed: 10/11/2016 4:13:09 PM By: Kathleen Kanner MD, FACS Entered By: [name] on 10/11/2016 09:36:39 [place]abert, [name] ([id number]) -------------------------------------------------------------------------------- Problem List Details Patient Name: YELINA, SARRATT. Date of Service: 10/11/2016 9:15 AM Medical Record Number: [id number] Patient Account Number: [id number] Date of Birth/Sex: February 16, 1951 (66 y.o. 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 ([id number]) -------------------------------------------------------------------------------- Progress Note Details Patient Name: [name]. Date of Service: 10/11/2016 9:15 AM Medical Record Number: [id number] Patient Account Number: [id number] Date of Birth/Sex: June 06, 1950 (66 y.o. Female) Treating RN: Afful, RN, BSN, [place] [name] Primary Care Provider: Tonia [name] Other Clinician: Referring Provider: Tonia [name] Treating Provider/Extender: [name] in Treatment: 13 Subjective Chief Complaint Information obtained from Patient [name] presents today for evaluation of her left medial hallux and left  lateral lower extremity ulcers History of Present Illness (HPI) The following HPI elements were documented for the patient's wound: Location: left medial hallux; LLE lateral Duration: left medial hallux-4 weeks; LLE lateral-2-1/2 weeks Timing: left medial hallux-pain with pressure/standing, ambulating, manipulation; LLE lateral-pain with manipulation Context: left medial hallux-originally was a crack in the skin; LLE lateral-traumatic injury with car door Modifying Factors: uncontrolled diabetes and smoking are exacerbating factors to wound healing 07/12/16- the patient arrives for initial evaluation of a left hallux and left lower extremity lateral ulcer. She states that the left hallux originally had a crack in the skin which she began to apply AandD ointment. She went to her PCP at [facility] on 4/4, while going into her appointment she struck her left lateral leg on the car door. At that appointment she was given a prescription for Bactrim and completed that on 4/14 with no adverse reaction. She states that it made little to no improvement in her toe wound. She went  back for a follow-up visit on 4/18 and was prescribed doxycycline for 10 days. She continues to apply AandD topical ointment along with soaking her foot in warm water. She is a diabetic, most recent A1c of 9% (I do not have records of this, this is per patient report). She is both on oral agents and insulin. She is a current smoker, approximately 0.5 ppd. She denies any remote or recent evaluation regarding arterial or venous disease. 07/19/16 Patient presents today for follow-up evaluation concerning her left great toe wound immediately and left lateral lower extremity wound. Unfortunately though both wounds appear better visually she is having a lot of discomfort. I'm unsure if this is a preformed drop of the issue or potentially the ideas or appointment is causing irritation which has caused her discomfort and pain. The good news is her wound culture came back negative and showed only normal skin flora and her x-ray of the left foot was also negative for any osseous change or osteomyelitis. Unfortunately she continues to have a lot of discomfort she would rate it a seven out of 10 right now. 07/26/2016 -- she is doing much better since the Iodoflex was stopped and she is on Santyl. Her culture report was negative and only had normal skin flora. She continues to smoke about a half pack of cigarettes a day. 08/05/2016 -- she is doing much better with her smoking and is down to about 5 cigarettes a day. The left lower extremity wound is healed and she continues to have a lot of tenderness on her left big toe. In the Kathleen, [name] ([id number]) course of conversation we realize she was using alcohol to clean her left big toe and we have told her to stop this. 09/16/16 on evaluation today patient appears to be doing better in regard to her left first toe wound. With that being said she is having some discomfort at the proximal aspect which is worrisome for potential infection although there is not  a significant amount of erythema noted at this point. She states this is new and she has not had pain like this up to this point. Fortunately she has no red streaks upper leg and no evidence of more extensive cellulitis. The wound is also measuring smaller. 10/04/16 on evaluation today patient appears to be doing fairly well in regard to her left first toe ulcer. She does have some Slough covering unfortunately she is not having as much discomfort she still has a little bit  of discomfort at the proximal portion of the wound there is no evidence of erythema or infection. 10/11/2016 -- she says she has completely given up smoking for the last 2 days and I have commended her on this Objective Constitutional Pulse regular. Respirations normal and unlabored. Afebrile. Vitals Time Taken: 9:18 AM, Height: 63 in, Weight: 224 lbs, BMI: 39.7, Temperature: 98.4 F, Pulse: 100 bpm, Respiratory Rate: 16 breaths/min, Blood Pressure: 160/102 mmHg. General Notes: MD made aware of high BP. Patient says she have not take any of her BP meds Eyes Nonicteric. Reactive to light. Ears, Nose, Mouth, and Throat Lips, teeth, and gums WNL.Marland Kitchen Moist mucosa without lesions. Neck supple and nontender. No palpable supraclavicular or cervical adenopathy. Normal sized without goiter. Respiratory WNL. No retractions.. Breath sounds WNL, No rubs, rales, rhonchi, or wheeze.. Cardiovascular Heart rhythm and rate regular, no murmur or gallop.. Pedal Pulses WNL. No clubbing, cyanosis or edema. Chest Kathleen, [name] ([id number]) Breasts symmetical and no nipple discharge.. Breast tissue WNL, no masses, lumps, or tenderness.. Lymphatic No adneopathy. No adenopathy. No adenopathy. Musculoskeletal Adexa without tenderness or enlargement.. Digits and nails w/o clubbing, cyanosis, infection, petechiae, ischemia, or inflammatory conditions.Marland Kitchen Psychiatric Judgement and insight Intact.. No evidence of depression, anxiety, or  agitation.. General Notes: the wound on the left medial toe continues to have a lot of slough and surrounding eschar and with a #3 curet I sharply removed and freshened all the edges and also cleansed out the base of the wound which still would benefit with Santyl ointment Integumentary (Hair, Skin) No suspicious lesions. No crepitus or fluctuance. No peri-wound warmth or erythema. No masses.. Wound #3 status is Open. Original cause of wound was Gradually Appeared. The wound is located on the [employer]. The wound measures 1.1cm length x 0.4cm width x 0.2cm depth; 0.346cm^2 area and 0.069cm^3 volume. There is Fat Layer (Subcutaneous Tissue) Exposed exposed. There is no tunneling or undermining noted. There is a medium amount of serous drainage noted. The wound margin is distinct with the outline attached to the wound base. There is small (1-33%) pink, pale granulation within the wound bed. There is a small (1-33%) amount of necrotic tissue within the wound bed including Adherent Slough. The periwound skin appearance exhibited: Callus. The periwound skin appearance did not exhibit: Crepitus, Excoriation, Induration, Rash, Scarring, Dry/Scaly, Maceration, Atrophie Blanche, Cyanosis, Ecchymosis, Hemosiderin Staining, Mottled, Pallor, Rubor, Erythema. Periwound temperature was noted as No Abnormality. The periwound has tenderness on palpation. Assessment Active Problems ICD-10 E11.621 - Type 2 diabetes mellitus with foot ulcer E11.622 - Type 2 diabetes mellitus with other skin ulcer F17.218 - Nicotine dependence, cigarettes, with other nicotine-induced disorders L97.522 - Non-pressure chronic ulcer of other part of left foot with fat layer exposed Procedures [name], [name] ([id number]) Wound #3 Pre-procedure diagnosis of Wound #3 is a Diabetic Wound/Ulcer of the Lower Extremity located on the Left,Medial Toe Great .Severity of Tissue Pre Debridement is: Fat layer exposed. There was  a Skin/Subcutaneous Tissue Debridement ([id number]) debridement with total area of 0.24 sq cm performed by Kathleen Kanner, MD. with the following instrument(s): Curette to remove Non-Viable tissue/material including Exudate, Fat Layer (and Subcutaneous Tissue) Exposed, Fibrin/Slough, and Subcutaneous after achieving pain control using Lidocaine 4% Topical Solution. A time out was conducted at 09:31, prior to the start of the procedure. A Minimum amount of bleeding was controlled with Pressure. The procedure was tolerated well with a pain level of 2 throughout and a pain level of 2 following the procedure.  Post Debridement Measurements: 0.8cm length x 0.3cm width x 0.2cm depth; 0.038cm^3 volume. Character of Wound/Ulcer Post Debridement is stable. Severity of Tissue Post Debridement is: Fat layer exposed. Post procedure Diagnosis Wound #3: Same as Pre-Procedure Plan Wound Cleansing: Wound #3 Left,Medial Toe Great: Clean wound with Normal Saline. Cleanse wound with mild soap and water Anesthetic: Wound #3 Left,Medial Toe Great: Topical Lidocaine 4% cream applied to wound bed prior to debridement Primary Wound Dressing: Wound #3 Left,Medial Toe Great: Santyl Ointment Secondary Dressing: Wound #3 Left,Medial Toe Great: Dry Gauze Gauze and Kerlix/Conform Other - tape Dressing Change Frequency: Wound #3 Left,Medial Toe Great: Change dressing every day. Follow-up Appointments: Wound #3 Left,Medial Toe Great: Return Appointment in 1 week. Edema Control: Wound #3 Left,Medial Toe Great: Elevate legs to the level of the heart and pump ankles as often as possible Additional Orders / Instructions: Wound #3 Left,Medial Toe Great: Stop Smoking Oborn, Kady J. ([id number]) Increase protein intake. Medications-please add to medication list.: Wound #3 Left,Medial Toe Great: Santyl Enzymatic Ointment - continue After sharp debridement today I have recommended: 1. Santyl ointment to be  continued to the left big toe daily. 2. continue to stay off cigarette smoking -- I have commended her on giving up 3. Adequate protein, vitamin A, vitamin C and zinc 4. regular visits the wound center Electronic Signature(s) Signed: 10/11/2016 10:03:40 AM By: Kathleen KannerBritto, Pershing Skidmore MD, FACS Entered By: Kathleen KannerBritto, Harlea Goetzinger on 10/11/2016 10:03:40 Wigglesworth, [name] ([id number]) -------------------------------------------------------------------------------- SuperBill Details Patient Name: Kathleen PiggPARRIS, Damyah J. Date of Service: 10/11/2016 Medical Record Number: [id number] Patient Account Number: [id number] Date of Birth/Sex: 1950-06-09 (66 y.o. 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 2 diabetes mellitus with foot ulcer E11.622 Type 2 diabetes mellitus with other skin ulcer F17.218 Nicotine dependence, cigarettes, with other nicotine-induced disorders L97.522 Non-pressure chronic ulcer of other part of left foot with fat layer exposed Facility Procedures CPT4 Code Description: [id number] 11042 - DEB SUBQ TISSUE 20 SQ CM/< ICD-10 Description Diagnosis 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 2 diabetes mellitus with foot ulcer E11.622 Type 2 diabetes mellitus with other skin ulcer L97.522 Non-pressure chronic ulcer of other part of  left foot Modifier: with fat lay Quantity: 1 er exposed Physician Procedures CPT4 Code Description: [id number] 11042 - WC PHYS SUBQ TISS 20 SQ CM ICD-10 Description Diagnosis 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 2 diabetes mellitus with foot ulcer E11.622 Type 2 diabetes mellitus with other skin ulcer L97.522 Non-pressure chronic ulcer of other part of  left foot Modifier: with fat laye Quantity: 1 r exposed Electronic Signature(s) Signed: 10/11/2016 10:04:02 AM By: Kathleen KannerBritto, [place] MD, FACS Entered By: Kathleen KannerBritto, Jillien Yakel on 10/11/2016  10:04:02

## 2016-10-18 ENCOUNTER — Encounter: Payer: Medicare HMO | Admitting: Surgery

## 2016-10-18 DIAGNOSIS — E11621 Type 2 diabetes mellitus with foot ulcer: Secondary | ICD-10-CM | POA: Diagnosis not present

## 2016-10-20 NOTE — Progress Notes (Signed)
Kathleen, [name] ([id number]) Visit Report for 10/18/2016 Chief Complaint Document Details Patient Name: [name], [name]. Date of Service: 10/18/2016 1:30 PM Medical Record Number: [id number] Patient Account Number: [id number] Date of Birth/Sex: 1950-08-25 (66 y.o. Female) Treating RN: Curtis Sites Primary Care Provider: Tonia Ghent Other Clinician: Referring Provider: Tonia Ghent Treating Provider/Extender: Rudene Re in Treatment: 14 Information Obtained from: Patient Chief Complaint Mrs. Gora presents today for evaluation of her left medial hallux and left lateral lower extremity ulcers Electronic Signature(s) Signed: 10/18/2016 2:33:20 PM By: Evlyn Kanner MD, FACS Entered By: Evlyn Kanner on 10/18/2016 14:33:20 Caicedo, [name] ([id number]) -------------------------------------------------------------------------------- HPI Details Patient Name: [name]. Date of Service: 10/18/2016 1:30 PM Medical Record Number: [id number] Patient Account Number: [id number] Date of Birth/Sex: [birth year] (66 y.o. Female) Treating RN: Curtis Sites Primary Care Provider: Tonia Ghent Other Clinician: Referring Provider: Tonia Ghent Treating Provider/Extender: Rudene Re in Treatment: 14 History of Present Illness Location: left medial hallux; LLE lateral Duration: left medial hallux-4 weeks; LLE lateral-2-1/2 weeks Timing: left medial hallux-pain with pressure/standing, ambulating, manipulation; LLE lateral-pain with manipulation Context: left medial hallux-originally was a crack in the skin; LLE lateral-traumatic injury with car door Modifying Factors: uncontrolled diabetes and smoking are exacerbating factors to wound healing HPI Description: 07/12/16- the patient arrives for initial evaluation of a left hallux and left lower extremity lateral ulcer. She states that the left hallux originally had a crack in the skin which she began to apply AandD ointment. She went to  her PCP at [facility] on 4/4, while going into her appointment she struck her left lateral leg on the car door. At that appointment she was given a prescription for Bactrim and completed that on 4/14 with no adverse reaction. She states that it made little to no improvement in her toe wound. She went back for a follow-up visit on 4/18 and was prescribed doxycycline for 10 days. She continues to apply AandD topical ointment along with soaking her foot in warm water. She is a diabetic, most recent A1c of 9% (I do not have records of this, this is per patient report). She is both on oral agents and insulin. She is a current smoker, approximately 0.5 ppd. She denies any remote or recent evaluation regarding arterial or venous disease. 07/19/16 Patient presents today for follow-up evaluation concerning her left great toe wound immediately and left lateral lower extremity wound. Unfortunately though both wounds appear better visually she is having a lot of discomfort. I'm unsure if this is a preformed drop of the issue or potentially the ideas or appointment is causing irritation which has caused her discomfort and pain. The good news is her wound culture came back negative and showed only normal skin flora and her x-ray of the left foot was also negative for any osseous change or osteomyelitis. Unfortunately she continues to have a lot of discomfort she would rate it a seven out of 10 right now. 07/26/2016 -- she is doing much better since the Iodoflex was stopped and she is on Santyl. Her culture report was negative and only had normal skin flora. She continues to smoke about a half pack of cigarettes a day. 08/05/2016 -- she is doing much better with her smoking and is down to about 5 cigarettes a day. The left lower extremity wound is healed and she continues to have a lot of tenderness on her left big toe. In the course of conversation we realize she was using alcohol to  clean  her left big toe and we have told her to stop this. 09/16/16 on evaluation today patient appears to be doing better in regard to her left first toe wound. With that being said she is having some discomfort at the proximal aspect which is worrisome for potential infection although there is not a significant amount of erythema noted at this point. She states this is new and she has not had pain like this up to this point. Fortunately she has no red streaks upper leg and no evidence of more extensive cellulitis. The wound is also measuring smaller. Kathleen PiggRRIS, [name] ([id number]) 10/04/16 on evaluation today patient appears to be doing fairly well in regard to her left first toe ulcer. She does have some Slough covering unfortunately she is not having as much discomfort she still has a little bit of discomfort at the proximal portion of the wound there is no evidence of erythema or infection. 10/11/2016 -- she says she has completely given up smoking for the last 2 days and I have commended her on this Electronic Signature(s) Signed: 10/18/2016 2:33:24 PM By: Evlyn KannerBritto, Salvador Bigbee MD, FACS Entered By: Evlyn KannerBritto, Donna Snooks on 10/18/2016 14:33:24 Caselli, [name] ([id number]) -------------------------------------------------------------------------------- Physical Exam Details Patient Name: Kathleen PiggRRIS, [name] Date of Service: 10/18/2016 1:30 PM Medical Record Number: [id number] Patient Account Number: [id number] Date of Birth/Sex: 12-05-1950 (66 y.o. Female) Treating RN: Curtis Sitesorthy, Joanna Primary Care Provider: Tonia GhentBENDER, ABBY Other Clinician: Referring Provider: Tonia GhentBENDER, ABBY Treating Provider/Extender: Rudene ReBritto, Ethelene Closser Weeks in Treatment: 14 Constitutional . Pulse regular. Respirations normal and unlabored. Afebrile. . Eyes Nonicteric. Reactive to light. Ears, Nose, Mouth, and Throat Lips, teeth, and gums WNL.Marland Kitchen. Moist mucosa without lesions. Neck supple and nontender. No palpable supraclavicular or cervical adenopathy.  Normal sized without goiter. Respiratory WNL. No retractions.. Cardiovascular Pedal Pulses WNL. No clubbing, cyanosis or edema. Lymphatic No adneopathy. No adenopathy. No adenopathy. Musculoskeletal Adexa without tenderness or enlargement.. Digits and nails w/o clubbing, cyanosis, infection, petechiae, ischemia, or inflammatory conditions.. Integumentary (Hair, Skin) No suspicious lesions. No crepitus or fluctuance. No peri-wound warmth or erythema. No masses.Marland Kitchen. Psychiatric Judgement and insight Intact.. No evidence of depression, anxiety, or agitation.. Notes the wound on the left medial big toe has minimal slough which is fibrotic and no sharp debridement was attempted today. Electronic Signature(s) Signed: 10/18/2016 2:34:07 PM By: Evlyn KannerBritto, Caileen Veracruz MD, FACS Entered By: Evlyn KannerBritto, Muath Hallam on 10/18/2016 14:34:07 Bischoff, [name] ([id number]) -------------------------------------------------------------------------------- Physician Orders Details Patient Name: Kathleen PiggRRIS, Elayjah J. Date of Service: 10/18/2016 1:30 PM Medical Record Number: [id number] Patient Account Number: [id number] Date of Birth/Sex: 12-05-1950 (66 y.o. Female) Treating RN: Curtis Sitesorthy, Joanna Primary Care Provider: Tonia GhentBENDER, ABBY Other Clinician: Referring Provider: Tonia GhentBENDER, ABBY Treating Provider/Extender: Rudene ReBritto, Brooklyn Jeff Weeks in Treatment: 14 Verbal / Phone Orders: No Diagnosis Coding Wound Cleansing Wound #3 Left,Medial Toe Great o Clean wound with Normal Saline. o Cleanse wound with mild soap and water Anesthetic Wound #3 Left,Medial Toe Great o Topical Lidocaine 4% cream applied to wound bed prior to debridement Primary Wound Dressing Wound #3 Left,Medial Toe Great o Santyl Ointment Secondary Dressing Wound #3 Left,Medial Toe Great o Dry Gauze o Gauze and Kerlix/Conform o Other - tape Dressing Change Frequency Wound #3 Left,Medial Toe Great o Change dressing every day. Follow-up Appointments Wound #3  Left,Medial Toe Great o Return Appointment in 1 week. Edema Control Wound #3 Left,Medial Toe Great o Elevate legs to the level of the heart and pump ankles as often as possible Additional Orders / Instructions Wound #3 Left,Medial Toe  Great o Stop Smoking o Increase protein intake. LAKISA, LOTZ ([id number]) Medications-please add to medication list. Wound #3 Left,Medial Toe Great o Santyl Enzymatic Ointment - continue Electronic Signature(s) Signed: 10/18/2016 3:58:43 PM By: Evlyn Kanner MD, FACS Signed: 10/18/2016 4:26:29 PM By: Curtis Sites Entered By: Curtis Sites on 10/18/2016 14:10:51 Schatzman, [name] ([id number]) -------------------------------------------------------------------------------- Problem List Details Patient Name: ALEJANDRA, HUNT. Date of Service: 10/18/2016 1:30 PM Medical Record Number: [id number] Patient Account Number: [id number] Date of Birth/Sex: 10/15/1950 (66 y.o. 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 ([id number]) -------------------------------------------------------------------------------- Progress Note Details Patient Name: [name]. Date of Service: 10/18/2016 1:30 PM Medical Record Number: [id number] Patient Account Number: [id number] Date of Birth/Sex: 1950/08/16 (66 y.o. Female) Treating RN: Curtis Sites Primary Care Provider: Tonia Ghent Other Clinician: Referring Provider: Tonia Ghent Treating Provider/Extender: Rudene Re in Treatment: 14 Subjective Chief Complaint Information obtained from Patient Mrs. Donovan presents today for evaluation of her left medial hallux and left lateral lower extremity ulcers History of Present Illness (HPI) The following HPI elements were documented for the patient's wound: Location: left medial hallux; LLE lateral Duration: left medial hallux-4 weeks; LLE lateral-2-1/2 weeks Timing: left medial hallux-pain with pressure/standing, ambulating, manipulation; LLE lateral-pain with manipulation Context: left medial hallux-originally was a crack in the skin; LLE lateral-traumatic injury with car door Modifying Factors: uncontrolled diabetes and smoking are exacerbating factors to wound healing 07/12/16- the patient arrives for initial evaluation of a left hallux and left lower extremity lateral ulcer. She states that the left hallux originally had a crack in the skin which she began to apply AandD ointment. She went to her PCP at [facility] on 4/4, while going into her appointment she struck her left lateral leg on the car door. At that appointment she was given a prescription for Bactrim and completed that on 4/14 with no adverse reaction. She states that it made little to no improvement in her toe wound. She went back for a follow-up visit on 4/18 and was prescribed doxycycline for 10 days. She continues to apply AandD topical ointment along with  soaking her foot in warm water. She is a diabetic, most recent A1c of 9% (I do not have records of this, this is per patient report). She is both on oral agents and insulin. She is a current smoker, approximately 0.5 ppd. She denies any remote or recent evaluation regarding arterial or venous disease. 07/19/16 Patient presents today for follow-up evaluation concerning her left great toe wound immediately and left lateral lower extremity wound. Unfortunately though both wounds appear better visually she is having a lot of discomfort. I'm unsure if this is a preformed drop  of the issue or potentially the ideas or appointment is causing irritation which has caused her discomfort and pain. The good news is her wound culture came back negative and showed only normal skin flora and her x-ray of the left foot was also negative for any osseous change or osteomyelitis. Unfortunately she continues to have a lot of discomfort she would rate it a seven out of 10 right now. 07/26/2016 -- she is doing much better since the Iodoflex was stopped and she is on Santyl. Her culture report was negative and only had normal skin flora. She continues to smoke about a half pack of cigarettes a day. 08/05/2016 -- she is doing much better with her smoking and is down to about 5 cigarettes a day. The left lower extremity wound is healed and she continues to have a lot of tenderness on her left big toe. In the JALEIGHA, DEANE ([id number]) course of conversation we realize she was using alcohol to clean her left big toe and we have told her to stop this. 09/16/16 on evaluation today patient appears to be doing better in regard to her left first toe wound. With that being said she is having some discomfort at the proximal aspect which is worrisome for potential infection although there is not a significant amount of erythema noted at this point. She states this is new and she has not had pain like this up to this point.  Fortunately she has no red streaks upper leg and no evidence of more extensive cellulitis. The wound is also measuring smaller. 10/04/16 on evaluation today patient appears to be doing fairly well in regard to her left first toe ulcer. She does have some Slough covering unfortunately she is not having as much discomfort she still has a little bit of discomfort at the proximal portion of the wound there is no evidence of erythema or infection. 10/11/2016 -- she says she has completely given up smoking for the last 2 days and I have commended her on this Objective Constitutional Pulse regular. Respirations normal and unlabored. Afebrile. Vitals Time Taken: 1:37 PM, Height: 63 in, Weight: 224 lbs, BMI: 39.7, Temperature: 98.1 F, Pulse: 82 bpm, Respiratory Rate: 18 breaths/min, Blood Pressure: 129/63 mmHg. Eyes Nonicteric. Reactive to light. Ears, Nose, Mouth, and Throat Lips, teeth, and gums WNL.Marland Kitchen Moist mucosa without lesions. Neck supple and nontender. No palpable supraclavicular or cervical adenopathy. Normal sized without goiter. Respiratory WNL. No retractions.. Cardiovascular Pedal Pulses WNL. No clubbing, cyanosis or edema. Lymphatic No adneopathy. No adenopathy. No adenopathy. PARILEE, HALLY ([id number]) Musculoskeletal Adexa without tenderness or enlargement.. Digits and nails w/o clubbing, cyanosis, infection, petechiae, ischemia, or inflammatory conditions.Marland Kitchen Psychiatric Judgement and insight Intact.. No evidence of depression, anxiety, or agitation.. General Notes: the wound on the left medial big toe has minimal slough which is fibrotic and no sharp debridement was attempted today. Integumentary (Hair, Skin) No suspicious lesions. No crepitus or fluctuance. No peri-wound warmth or erythema. No masses.. Wound #3 status is Open. Original cause of wound was Gradually Appeared. The wound is located on the [employer]. The wound measures 0.5cm length x 0.2cm width x  0.1cm depth; 0.079cm^2 area and 0.008cm^3 volume. There is Fat Layer (Subcutaneous Tissue) Exposed exposed. There is no tunneling or undermining noted. There is a medium amount of serous drainage noted. The wound margin is distinct with the outline attached to the wound base. There is medium (34-66%) pink, pale granulation within the wound bed. There is a medium (  34-66%) amount of necrotic tissue within the wound bed including Adherent Slough. The periwound skin appearance exhibited: Callus. The periwound skin appearance did not exhibit: Crepitus, Excoriation, Induration, Rash, Scarring, Dry/Scaly, Maceration, Atrophie Blanche, Cyanosis, Ecchymosis, Hemosiderin Staining, Mottled, Pallor, Rubor, Erythema. Periwound temperature was noted as No Abnormality. The periwound has tenderness on palpation. Assessment Active Problems ICD-10 E11.621 - Type 2 diabetes mellitus with foot ulcer E11.622 - Type 2 diabetes mellitus with other skin ulcer F17.218 - Nicotine dependence, cigarettes, with other nicotine-induced disorders L97.522 - Non-pressure chronic ulcer of other part of left foot with fat layer exposed Plan Wound Cleansing: Wound #3 Left,Medial Toe Great: Clean wound with Normal Saline. Cleanse wound with mild soap and water Boursiquot, Armie J. ([id number]) Anesthetic: Wound #3 Left,Medial Toe Great: Topical Lidocaine 4% cream applied to wound bed prior to debridement Primary Wound Dressing: Wound #3 Left,Medial Toe Great: Santyl Ointment Secondary Dressing: Wound #3 Left,Medial Toe Great: Dry Gauze Gauze and Kerlix/Conform Other - tape Dressing Change Frequency: Wound #3 Left,Medial Toe Great: Change dressing every day. Follow-up Appointments: Wound #3 Left,Medial Toe Great: Return Appointment in 1 week. Edema Control: Wound #3 Left,Medial Toe Great: Elevate legs to the level of the heart and pump ankles as often as possible Additional Orders / Instructions: Wound #3 Left,Medial  Toe Great: Stop Smoking Increase protein intake. Medications-please add to medication list.: Wound #3 Left,Medial Toe Great: Santyl Enzymatic Ointment - continue No sharp debridement was required today I have recommended: 1. Santyl ointment to be continued to the left big toe daily. 2. continue to stay off cigarette smoking -- I have commended her on giving up 3. Adequate protein, vitamin A, vitamin C and zinc 4. regular visits the wound center Electronic Signature(s) Signed: 10/18/2016 2:34:54 PM By: Evlyn KannerBritto, Earlyn Sylvan MD, FACS Entered By: Evlyn KannerBritto, Yazlin Ekblad on 10/18/2016 14:34:54 Sosinski, [name] ([id number]) -------------------------------------------------------------------------------- SuperBill Details Patient Name: Kathleen PiggPARRIS, Lucendia J. Date of Service: 10/18/2016 Medical Record Number: [id number] Patient Account Number: [id number] Date of Birth/Sex: 1950/04/25 (66 y.o. Female) Treating RN: Curtis Sitesorthy, Joanna Primary Care Provider: Tonia GhentBENDER, ABBY Other Clinician: Referring Provider: Tonia GhentBENDER, ABBY Treating Provider/Extender: Rudene ReBritto, Lyfe Reihl Weeks in Treatment: 14 Diagnosis Coding ICD-10 Codes Code Description 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 2 diabetes mellitus with foot ulcer E11.622 Type 2 diabetes mellitus with other skin ulcer F17.218 Nicotine dependence, cigarettes, with other nicotine-induced disorders L97.522 Non-pressure chronic ulcer of other part of left foot with fat layer exposed Facility Procedures CPT4 Code: [id number] Description: 99213 - WOUND CARE VISIT-LEV 3 EST PT Modifier: Quantity: 1 Physician Procedures CPT4 Code Description: [id number] 99213 - WC PHYS LEVEL 3 - EST PT ICD-10 Description Diagnosis 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 2 diabetes mellitus with foot ulcer E11.622 Type 2 diabetes mellitus with other skin ulcer L97.522 Non-pressure chronic ulcer of other part of left  foo Modifier: t with fat laye Quantity: 1 r exposed Electronic Signature(s) Signed: 10/18/2016 2:35:19 PM By: Evlyn KannerBritto, Kairav Russomanno MD, FACS Entered By:  Evlyn KannerBritto, Manson Luckadoo on 10/18/2016 14:35:19

## 2016-10-21 NOTE — Progress Notes (Signed)
[name], Perian J. ([id number]) Visit Report for 10/18/2016 Arrival Information Details Patient Name: [name], Mabell J. Date of Service: 10/18/2016 1:30 PM Medical Record Number: [id number] Patient Account Number: [id number] Date of Birth/Sex: Oct 24, 1950 (66 y.o. Female) Treating RN: Curtis Sitesorthy, Joanna Primary Care Delayne Sanzo: Tonia GhentBENDER, ABBY Other Clinician: Referring Renesmay Nesbitt: Tonia GhentBENDER, ABBY Treating Aanshi Batchelder/Extender: Rudene ReBritto, Errol Weeks in Treatment: 14 Visit Information History Since Last Visit Added or deleted any medications: No Patient Arrived: Cane Any new allergies or adverse reactions: No Arrival Time: 13:36 Had a fall or experienced change in No Accompanied By: self activities of daily living that may affect Transfer Assistance: None risk of falls: Patient Identification Verified: Yes Signs or symptoms of abuse/neglect since last No Secondary Verification Process Completed: Yes visito Patient Requires Transmission-Based No Hospitalized since last visit: No Precautions: Has Dressing in Place as Prescribed: Yes Patient Has Alerts: Yes Pain Present Now: No Patient Alerts: Dm II Electronic Signature(s) Signed: 10/18/2016 4:26:29 PM By: Curtis Sitesorthy, Joanna Entered By: Curtis Sitesorthy, Joanna on 10/18/2016 13:37:14 Vandevoort, [name] ([id number]) -------------------------------------------------------------------------------- Clinic Level of Care Assessment Details Patient Name: [name], [name] Date of Service: 10/18/2016 1:30 PM Medical Record Number: [id number] Patient Account Number: [id number] Date of Birth/Sex: Oct 24, 1950 (66 y.o. Female) Treating RN: Curtis Sitesorthy, Joanna Primary Care Yajaira Doffing: Tonia GhentBENDER, ABBY Other Clinician: Referring Valene Villa: Tonia GhentBENDER, ABBY Treating Ciji Boston/Extender: Rudene ReBritto, Errol Weeks in Treatment: 14 Clinic Level of Care Assessment Items TOOL 4 Quantity Score []  - Use when only an EandM is performed on FOLLOW-UP visit 0 ASSESSMENTS - Nursing Assessment / Reassessment X -  Reassessment of Co-morbidities (includes updates in patient status) 1 10 X - Reassessment of Adherence to Treatment Plan 1 5 ASSESSMENTS - Wound and Skin Assessment / Reassessment X - Simple Wound Assessment / Reassessment - one wound 1 5 []  - Complex Wound Assessment / Reassessment - multiple wounds 0 []  - Dermatologic / Skin Assessment (not related to wound area) 0 ASSESSMENTS - Focused Assessment []  - Circumferential Edema Measurements - multi extremities 0 []  - Nutritional Assessment / Counseling / Intervention 0 X - Lower Extremity Assessment (monofilament, tuning fork, pulses) 1 5 []  - Peripheral Arterial Disease Assessment (using hand held doppler) 0 ASSESSMENTS - Ostomy and/or Continence Assessment and Care []  - Incontinence Assessment and Management 0 []  - Ostomy Care Assessment and Management (repouching, etc.) 0 PROCESS - Coordination of Care X - Simple Patient / Family Education for ongoing care 1 15 []  - Complex (extensive) Patient / Family Education for ongoing care 0 []  - Staff obtains ChiropractorConsents, Records, Test Results / Process Orders 0 []  - Staff telephones HHA, Nursing Homes / Clarify orders / etc 0 []  - Routine Transfer to another Facility (non-emergent condition) 0 Sealy, Nirvana J. ([id number]) []  - Routine Hospital Admission (non-emergent condition) 0 []  - New Admissions / Manufacturing engineernsurance Authorizations / Ordering NPWT, Apligraf, etc. 0 []  - Emergency Hospital Admission (emergent condition) 0 X - Simple Discharge Coordination 1 10 []  - Complex (extensive) Discharge Coordination 0 PROCESS - Special Needs []  - Pediatric / Minor Patient Management 0 []  - Isolation Patient Management 0 []  - Hearing / Language / Visual special needs 0 []  - Assessment of Community assistance (transportation, D/C planning, etc.) 0 []  - Additional assistance / Altered mentation 0 []  - Support Surface(s) Assessment (bed, cushion, seat, etc.) 0 INTERVENTIONS - Wound Cleansing / Measurement X -  Simple Wound Cleansing - one wound 1 5 []  - Complex Wound Cleansing - multiple wounds 0 X - Wound Imaging (photographs - any number of wounds)  1 5 []  - Wound Tracing (instead of photographs) 0 X - Simple Wound Measurement - one wound 1 5 []  - Complex Wound Measurement - multiple wounds 0 INTERVENTIONS - Wound Dressings X - Small Wound Dressing one or multiple wounds 1 10 []  - Medium Wound Dressing one or multiple wounds 0 []  - Large Wound Dressing one or multiple wounds 0 X - Application of Medications - topical 1 5 []  - Application of Medications - injection 0 INTERVENTIONS - Miscellaneous []  - External ear exam 0 Zelaya, Denitra J. ([id number]) []  - Specimen Collection (cultures, biopsies, blood, body fluids, etc.) 0 []  - Specimen(s) / Culture(s) sent or taken to Lab for analysis 0 []  - Patient Transfer (multiple staff / Michiel Sites Lift / Similar devices) 0 []  - Simple Staple / Suture removal (25 or less) 0 []  - Complex Staple / Suture removal (26 or more) 0 []  - Hypo / Hyperglycemic Management (close monitor of Blood Glucose) 0 []  - Ankle / Brachial Index (ABI) - do not check if billed separately 0 X - Vital Signs 1 5 Has the patient been seen at the hospital within the last three years: Yes Total Score: 85 Level Of Care: New/Established - Level 3 Electronic Signature(s) Signed: 10/18/2016 4:26:29 PM By: Curtis Sites Entered By: Curtis Sites on 10/18/2016 14:11:29 Passon, [name] ([id number]) -------------------------------------------------------------------------------- Encounter Discharge Information Details Patient Name: Kathleen Pigg. Date of Service: 10/18/2016 1:30 PM Medical Record Number: [id number] Patient Account Number: [id number] Date of Birth/Sex: 09-26-1950 (66 y.o. Female) Treating RN: Curtis Sites Primary Care Priyal Musquiz: Tonia Ghent Other Clinician: Referring Diya Gervasi: Tonia Ghent Treating Harlyn Italiano/Extender: Rudene Re in Treatment: 14 Encounter  Discharge Information Items Discharge Pain Level: 0 Discharge Condition: Stable Ambulatory Status: Cane Discharge Destination: Home Transportation: Private Auto Accompanied By: self Schedule Follow-up Appointment: Yes Medication Reconciliation completed No and provided to Patient/Care Ngoc Daughtridge: Provided on Clinical Summary of Care: 10/18/2016 Form Type Recipient Paper Patient EP Electronic Signature(s) Signed: 10/18/2016 2:22:47 PM By: Gwenlyn Perking Entered By: Gwenlyn Perking on 10/18/2016 14:22:47 Jerez, [name] ([id number]) -------------------------------------------------------------------------------- Lower Extremity Assessment Details Patient Name: Kathleen Pigg. Date of Service: 10/18/2016 1:30 PM Medical Record Number: [id number] Patient Account Number: [id number] Date of Birth/Sex: [birth year] (66 y.o. Female) Treating RN: Curtis Sites Primary Care Sena Hoopingarner: Tonia Ghent Other Clinician: Referring Thelda Gagan: Tonia Ghent Treating Raynell Scott/Extender: Rudene Re in Treatment: 14 Vascular Assessment Pulses: Dorsalis Pedis Palpable: [Left:Yes] Posterior Tibial Extremity colors, hair growth, and conditions: Extremity Color: [Left:Normal] Hair Growth on Extremity: [Left:No] Temperature of Extremity: [Left:Warm] Capillary Refill: [Left:< 3 seconds] Electronic Signature(s) Signed: 10/18/2016 4:26:29 PM By: Curtis Sites Entered By: Curtis Sites on 10/18/2016 13:43:50 Betty, Rhythm J. ([id number]) -------------------------------------------------------------------------------- Multi Wound Chart Details Patient Name: Kathleen Pigg. Date of Service: 10/18/2016 1:30 PM Medical Record Number: [id number] Patient Account Number: [id number] Date of Birth/Sex: [birth year] (66 y.o. Female) Treating RN: Curtis Sites Primary Care Yuri Fana: Tonia Ghent Other Clinician: Referring Hermila Millis: Tonia Ghent Treating Safiyya Stokes/Extender: Rudene Re in Treatment:  14 Vital Signs Height(in): 63 Pulse(bpm): 82 Weight(lbs): 224 Blood Pressure 129/63 (mmHg): Body Mass Index(BMI): 40 Temperature(F): 98.1 Respiratory Rate 18 (breaths/min): Photos: [3:No Photos] [N/A:N/A] Wound Location: [3:Left Toe Great - Medial] [N/A:N/A] Wounding Event: [3:Gradually Appeared] [N/A:N/A] Primary Etiology: [3:Diabetic Wound/Ulcer of the Lower Extremity] [N/A:N/A] Secondary Etiology: [3:Infection - not elsewhere classified] [N/A:N/A] Comorbid History: [3:Asthma, Chronic Obstructive Pulmonary Disease (COPD), Congestive Heart Failure, Hypertension, Type II Diabetes, Osteoarthritis, Neuropathy] [N/A:N/A] Date Acquired: [3:06/14/2016] [N/A:N/A] Weeks of Treatment: [3:14] [N/A:N/A] Wound Status: [  3:Open] [N/A:N/A] Pending Amputation on Yes [N/A:N/A] Presentation: Measurements L x W x D 0.5x0.2x0.1 [N/A:N/A] (cm) Area (cm) : [3:0.079] [N/A:N/A] Volume (cm) : [3:0.008] [N/A:N/A] % Reduction in Area: [3:98.70%] [N/A:N/A] % Reduction in Volume: 98.60% [N/A:N/A] Classification: [3:Grade 1] [N/A:N/A] Exudate Amount: [3:Medium] [N/A:N/A] Exudate Type: [3:Serous] [N/A:N/A] Exudate Color: [3:amber] [N/A:N/A] Wound Margin: [3:Distinct, outline attached] [N/A:N/A] Granulation Amount: Medium (34-66%) N/A N/A Granulation Quality: Pink, Pale N/A N/A Necrotic Amount: Medium (34-66%) N/A N/A Exposed Structures: Fat Layer (Subcutaneous N/A N/A Tissue) Exposed: Yes Fascia: No Tendon: No Muscle: No Joint: No Bone: No Epithelialization: Large (67-100%) N/A N/A Periwound Skin Texture: Callus: Yes N/A N/A Excoriation: No Induration: No Crepitus: No Rash: No Scarring: No Periwound Skin Maceration: No N/A N/A Moisture: Dry/Scaly: No Periwound Skin Color: Atrophie Blanche: No N/A N/A Cyanosis: No Ecchymosis: No Erythema: No Hemosiderin Staining: No Mottled: No Pallor: No Rubor: No Temperature: No Abnormality N/A N/A Tenderness on Yes N/A N/A Palpation: Wound  Preparation: Ulcer Cleansing: N/A N/A Rinsed/Irrigated with Saline Topical Anesthetic Applied: Other: lidocaine 4% Treatment Notes Wound #3 (Left, Medial Toe Great) 1. Cleansed with: Clean wound with Normal Saline 2. Anesthetic Topical Lidocaine 4% cream to wound bed prior to debridement 4. Dressing Applied: Santyl Ointment 5. Secondary Dressing Applied Gauze and Kerlix/Conform 7. Secured with ANU, STAGNER ([id number]) Tape Notes darco shoe Electronic Signature(s) Signed: 10/18/2016 2:33:14 PM By: Evlyn Kanner MD, FACS Entered By: Evlyn Kanner on 10/18/2016 14:33:14 Steers, [name] ([id number]) -------------------------------------------------------------------------------- Multi-Disciplinary Care Plan Details Patient Name: ARMELIA, PENTON. Date of Service: 10/18/2016 1:30 PM Medical Record Number: [id number] Patient Account Number: [id number] Date of Birth/Sex: 06/04/1950 (66 y.o. Female) Treating RN: Curtis Sites Primary Care Ann Groeneveld: Tonia Ghent Other Clinician: Referring Markan Cazarez: Tonia Ghent Treating Krishana Lutze/Extender: Rudene Re in Treatment: 14 Active Inactive ` Abuse / Safety / Falls / Self Care Management Nursing Diagnoses: Potential for falls Goals: Patient will remain injury free Date Initiated: 07/12/2016 Target Resolution Date: 10/26/2016 Goal Status: Active Interventions: Assess fall risk on admission and as needed Assess impairment of mobility on admission and as needed per policy Notes: ` Nutrition Nursing Diagnoses: Imbalanced nutrition Impaired glucose control: actual or potential Potential for alteratiion in Nutrition/Potential for imbalanced nutrition Goals: Patient/caregiver agrees to and verbalizes understanding of need to use nutritional supplements and/or vitamins as prescribed Date Initiated: 07/12/2016 Target Resolution Date: 09/28/2016 Goal Status: Active Interventions: Assess patient nutrition upon admission and as  needed per policy Provide education on elevated blood sugars and impact on wound healing Notes: ERMINIE, FOULKS ([id number]) Orientation to the Wound Care Program Nursing Diagnoses: Knowledge deficit related to the wound healing center program Goals: Patient/caregiver will verbalize understanding of the Wound Healing Center Program Date Initiated: 07/12/2016 Target Resolution Date: 07/27/2016 Goal Status: Active Interventions: Provide education on orientation to the wound center Notes: ` Pain, Acute or Chronic Nursing Diagnoses: Pain, acute or chronic: actual or potential Potential alteration in comfort, pain Goals: Patient/caregiver will verbalize adequate pain control between visits Date Initiated: 07/12/2016 Target Resolution Date: 10/26/2016 Goal Status: Active Interventions: Assess comfort goal upon admission Complete pain assessment as per visit requirements Notes: ` Soft Tissue Infection Nursing Diagnoses: Impaired tissue integrity Knowledge deficit related to disease process and management Knowledge deficit related to home infection control: handwashing, handling of soiled dressings, supply storage Goals: Patient/caregiver will verbalize understanding of or measures to prevent infection and contamination in the home setting Date Initiated: 07/12/2016 Target Resolution Date: 09/28/2016 Goal Status: Active PARRISH, DADDARIO ([id number]) Interventions:  Assess signs and symptoms of infection every visit Notes: ` Wound/Skin Impairment Nursing Diagnoses: Impaired tissue integrity Knowledge deficit related to smoking impact on wound healing Knowledge deficit related to ulceration/compromised skin integrity Goals: Ulcer/skin breakdown will have a volume reduction of 80% by week 12 Date Initiated: 07/12/2016 Target Resolution Date: 10/26/2016 Goal Status: Active Interventions: Assess patient/caregiver ability to perform ulcer/skin care regimen upon admission and as  needed Assess ulceration(s) every visit Provide education on smoking Notes: Electronic Signature(s) Signed: 10/18/2016 4:26:29 PM By: Curtis Sites Entered By: Curtis Sites on 10/18/2016 13:43:56 Fraser, Adellyn J. ([id number]) -------------------------------------------------------------------------------- Pain Assessment Details Patient Name: Kathleen Pigg. Date of Service: 10/18/2016 1:30 PM Medical Record Number: [id number] Patient Account Number: [id number] Date of Birth/Sex: 12-Oct-1950 (66 y.o. Female) Treating RN: Curtis Sites Primary Care Manjot Beumer: Tonia Ghent Other Clinician: Referring Caprisha Bridgett: Tonia Ghent Treating Carmen Vallecillo/Extender: Rudene Re in Treatment: 14 Active Problems Location of Pain Severity and Description of Pain Patient Has Paino No Site Locations Pain Management and Medication Current Pain Management: Notes Topical or injectable lidocaine is offered to patient for acute pain when surgical debridement is performed. If needed, Patient is instructed to use over the counter pain medication for the following 24-48 hours after debridement. Wound care MDs do not prescribed pain medications. Patient has chronic pain or uncontrolled pain. Patient has been instructed to make an appointment with their Primary Care Physician for pain management. Electronic Signature(s) Signed: 10/18/2016 4:26:29 PM By: Curtis Sites Entered By: Curtis Sites on 10/18/2016 13:37:21 Stelly, [name] ([id number]) -------------------------------------------------------------------------------- Patient/Caregiver Education Details Patient Name: Kathleen Pigg Date of Service: 10/18/2016 1:30 PM Medical Record Number: [id number] Patient Account Number: [id number] Date of Birth/Gender: 1951/01/20 (66 y.o. Female) Treating RN: Curtis Sites Primary Care Physician: Tonia Ghent Other Clinician: Referring Physician: Tonia Ghent Treating Physician/Extender: Rudene Re  in Treatment: 14 Education Assessment Education Provided To: Patient Education Topics Provided Wound/Skin Impairment: Handouts: Other: wound care as ordered Methods: Demonstration, Explain/Verbal Responses: State content correctly Electronic Signature(s) Signed: 10/18/2016 4:26:29 PM By: Curtis Sites Entered By: Curtis Sites on 10/18/2016 13:45:00 Bronder, [name] ([id number]) -------------------------------------------------------------------------------- Wound Assessment Details Patient Name: Kathleen Pigg. Date of Service: 10/18/2016 1:30 PM Medical Record Number: [id number] Patient Account Number: [id number] Date of Birth/Sex: 1950-05-29 (66 y.o. Female) Treating RN: Curtis Sites Primary Care Marcellius Montagna: Tonia Ghent Other Clinician: Referring Natsha Guidry: Tonia Ghent Treating Kamori Barbier/Extender: Rudene Re in Treatment: 14 Wound Status Wound Number: 3 Primary Diabetic Wound/Ulcer of the Lower Etiology: Extremity Wound Location: Left Toe Great - Medial Secondary Infection - not elsewhere classified Wounding Event: Gradually Appeared Etiology: Date Acquired: 06/14/2016 Wound Open Weeks Of Treatment: 14 Status: Clustered Wound: No Comorbid Asthma, Chronic Obstructive Pending Amputation On Presentation History: Pulmonary Disease (COPD), Congestive Heart Failure, Hypertension, Type II Diabetes, Osteoarthritis, Neuropathy Photos Photo Uploaded By: Curtis Sites on 10/18/2016 16:19:56 Wound Measurements Length: (cm) 0.5 Width: (cm) 0.2 Depth: (cm) 0.1 Area: (cm) 0.079 Volume: (cm) 0.008 % Reduction in Area: 98.7% % Reduction in Volume: 98.6% Epithelialization: Large (67-100%) Tunneling: No Undermining: No Wound Description Classification: Grade 1 Wound Margin: Distinct, outline attached Exudate Amount: Medium Exudate Type: Serous Exudate Color: amber Foul Odor After Cleansing: No Slough/Fibrino Yes Wound Bed Scoggins, Mashawn J.  ([id number]) Granulation Amount: Medium (34-66%) Exposed Structure Granulation Quality: Pink, Pale Fascia Exposed: No Necrotic Amount: Medium (34-66%) Fat Layer (Subcutaneous Tissue) Exposed: Yes Necrotic Quality: Adherent Slough Tendon Exposed: No Muscle Exposed: No Joint Exposed: No Bone Exposed: No Periwound Skin Texture Texture Color No  Abnormalities Noted: No No Abnormalities Noted: No Callus: Yes Atrophie Blanche: No Crepitus: No Cyanosis: No Excoriation: No Ecchymosis: No Induration: No Erythema: No Rash: No Hemosiderin Staining: No Scarring: No Mottled: No Pallor: No Moisture Rubor: No No Abnormalities Noted: No Dry / Scaly: No Temperature / Pain Maceration: No Temperature: No Abnormality Tenderness on Palpation: Yes Wound Preparation Ulcer Cleansing: Rinsed/Irrigated with Saline Topical Anesthetic Applied: Other: lidocaine 4%, Treatment Notes Wound #3 (Left, Medial Toe Great) 1. Cleansed with: Clean wound with Normal Saline 2. Anesthetic Topical Lidocaine 4% cream to wound bed prior to debridement 4. Dressing Applied: Santyl Ointment 5. Secondary Dressing Applied Gauze and Kerlix/Conform 7. Secured with Tape Notes darco Engineering geologistshoe Electronic Signature(s) Signed: 10/18/2016 4:26:29 PM By: Curtis Sitesorthy, Joanna Entered By: Curtis Sitesorthy, Joanna on 10/18/2016 13:43:14 Larsh, [name] ([id number]) Pascucci, Lamara Shela CommonsJ. ([id number]) -------------------------------------------------------------------------------- Vitals Details Patient Name: [name], Jorryn J. Date of Service: 10/18/2016 1:30 PM Medical Record Number: [id number] Patient Account Number: [id number] Date of Birth/Sex: 08/21/1950 (66 y.o. Female) Treating RN: Curtis Sitesorthy, Joanna Primary Care D'Arcy Abraha: Tonia GhentBENDER, ABBY Other Clinician: Referring Nicolina Hirt: Tonia GhentBENDER, ABBY Treating Munirah Doerner/Extender: Rudene ReBritto, Errol Weeks in Treatment: 14 Vital Signs Time Taken: 13:37 Temperature (F): 98.1 Height (in): 63 Pulse (bpm):  82 Weight (lbs): 224 Respiratory Rate (breaths/min): 18 Body Mass Index (BMI): 39.7 Blood Pressure (mmHg): 129/63 Reference Range: 80 - 120 mg / dl Electronic Signature(s) Signed: 10/18/2016 4:26:29 PM By: Curtis Sitesorthy, Joanna Entered By: Curtis Sitesorthy, Joanna on 10/18/2016 13:39:51

## 2016-10-25 ENCOUNTER — Encounter: Payer: Medicare HMO | Attending: Surgery | Admitting: Surgery

## 2016-10-25 DIAGNOSIS — L97522 Non-pressure chronic ulcer of other part of left foot with fat layer exposed: Secondary | ICD-10-CM | POA: Insufficient documentation

## 2016-10-25 DIAGNOSIS — Z8673 Personal history of transient ischemic attack (TIA), and cerebral infarction without residual deficits: Secondary | ICD-10-CM | POA: Diagnosis not present

## 2016-10-25 DIAGNOSIS — I110 Hypertensive heart disease with heart failure: Secondary | ICD-10-CM | POA: Diagnosis not present

## 2016-10-25 DIAGNOSIS — Z885 Allergy status to narcotic agent status: Secondary | ICD-10-CM | POA: Insufficient documentation

## 2016-10-25 DIAGNOSIS — F17218 Nicotine dependence, cigarettes, with other nicotine-induced disorders: Secondary | ICD-10-CM | POA: Insufficient documentation

## 2016-10-25 DIAGNOSIS — Z880 Allergy status to penicillin: Secondary | ICD-10-CM | POA: Insufficient documentation

## 2016-10-25 DIAGNOSIS — Z794 Long term (current) use of insulin: Secondary | ICD-10-CM | POA: Insufficient documentation

## 2016-10-25 DIAGNOSIS — E11621 Type 2 diabetes mellitus with foot ulcer: Secondary | ICD-10-CM | POA: Insufficient documentation

## 2016-10-25 DIAGNOSIS — I509 Heart failure, unspecified: Secondary | ICD-10-CM | POA: Diagnosis not present

## 2016-10-25 DIAGNOSIS — E11622 Type 2 diabetes mellitus with other skin ulcer: Secondary | ICD-10-CM | POA: Insufficient documentation

## 2016-10-25 DIAGNOSIS — F329 Major depressive disorder, single episode, unspecified: Secondary | ICD-10-CM | POA: Insufficient documentation

## 2016-10-25 DIAGNOSIS — J449 Chronic obstructive pulmonary disease, unspecified: Secondary | ICD-10-CM | POA: Diagnosis not present

## 2016-10-25 DIAGNOSIS — M1990 Unspecified osteoarthritis, unspecified site: Secondary | ICD-10-CM | POA: Diagnosis not present

## 2016-10-27 NOTE — Progress Notes (Signed)
SHANTIL, VALLEJO ([id number]) Visit Report for 10/25/2016 Arrival Information Details Patient Name: [name], [name]. Date of Service: 10/25/2016 2:30 PM Medical Record Number: [id number] Patient Account Number: [id number] Date of Birth/Sex: 1951/01/09 (66 y.o. Female) Treating RN: Ashok Cordia, [name] Primary Care Laylana Gerwig: Tonia Ghent Other Clinician: Referring Mishka Stegemann: Tonia Ghent Treating Mykela Mewborn/Extender: Rudene Re in Treatment: 15 Visit Information History Since Last Visit All ordered tests and consults were completed: No Patient Arrived: Cane Added or deleted any medications: No Arrival Time: 14:41 Any new allergies or adverse reactions: No Accompanied By: self Had a fall or experienced change in No Transfer Assistance: None activities of daily living that may affect Patient Identification Verified: Yes risk of falls: Secondary Verification Process Completed: Yes Signs or symptoms of abuse/neglect since last No Patient Requires Transmission-Based No visito Precautions: Hospitalized since last visit: No Patient Has Alerts: Yes Has Dressing in Place as Prescribed: Yes Patient Alerts: Dm II Pain Present Now: No Electronic Signature(s) Signed: 10/25/2016 4:34:22 PM By: Alejandro Mulling Entered By: Alejandro Mulling on 10/25/2016 14:42:02 Habeeb, Gisselle J. ([id number]) -------------------------------------------------------------------------------- Encounter Discharge Information Details Patient Name: [name]. Date of Service: 10/25/2016 2:30 PM Medical Record Number: [id number] Patient Account Number: [id number] Date of Birth/Sex: 01-Sep-1950 (66 y.o. Female) Treating RN: Ashok Cordia, [name] Primary Care Shatira Dobosz: Tonia Ghent Other Clinician: Referring Aracelis Ulrey: Tonia Ghent Treating Quenisha Lovins/Extender: Rudene Re in Treatment: 15 Encounter Discharge Information Items Discharge Pain Level: 0 Discharge Condition: Stable Ambulatory Status: Cane Discharge  Destination: Home Transportation: Private Auto Accompanied By: self Schedule Follow-up Appointment: Yes Medication Reconciliation completed No and provided to Patient/Care Salimah Martinovich: Provided on Clinical Summary of Care: 10/25/2016 Form Type Recipient Paper Patient EP Electronic Signature(s) Signed: 10/25/2016 3:01:21 PM By: Gwenlyn Perking Entered By: Gwenlyn Perking on 10/25/2016 15:01:21 Pasion, Irelynd J. ([id number]) -------------------------------------------------------------------------------- Lower Extremity Assessment Details Patient Name: [name]. Date of Service: 10/25/2016 2:30 PM Medical Record Number: [id number] Patient Account Number: [id number] Date of Birth/Sex: 1950-08-13 (66 y.o. Female) Treating RN: Ashok Cordia, [name] Primary Care Shahiem Bedwell: Tonia Ghent Other Clinician: Referring Valon Glasscock: Tonia Ghent Treating Rayquon Uselman/Extender: Rudene Re in Treatment: 15 Vascular Assessment Pulses: Dorsalis Pedis Palpable: [Left:Yes] Posterior Tibial Extremity colors, hair growth, and conditions: Extremity Color: [Left:Normal] Temperature of Extremity: [Left:Warm] Capillary Refill: [Left:< 3 seconds] Toe Nail Assessment Left: Right: Thick: No Discolored: No Deformed: No Improper Length and Hygiene: No Electronic Signature(s) Signed: 10/25/2016 4:34:22 PM By: Alejandro Mulling Entered By: Alejandro Mulling on 10/25/2016 14:47:37 Gali, Shamirah J. ([id number]) -------------------------------------------------------------------------------- Multi Wound Chart Details Patient Name: [name]. Date of Service: 10/25/2016 2:30 PM Medical Record Number: [id number] Patient Account Number: [id number] Date of Birth/Sex: Nov 15, 1950 (66 y.o. Female) Treating RN: Ashok Cordia, [name] Primary Care Jaykob Minichiello: Tonia Ghent Other Clinician: Referring Irean Kendricks: Tonia Ghent Treating Ganesh Deeg/Extender: Rudene Re in Treatment: 15 Vital Signs Height(in): 63 Pulse(bpm):  84 Weight(lbs): 224 Blood Pressure 168/87 (mmHg): Body Mass Index(BMI): 40 Temperature(F): 98.2 Respiratory Rate 18 (breaths/min): Photos: [3:No Photos] [N/A:N/A] Wound Location: [3:Left Toe Great - Medial] [N/A:N/A] Wounding Event: [3:Gradually Appeared] [N/A:N/A] Primary Etiology: [3:Diabetic Wound/Ulcer of the Lower Extremity] [N/A:N/A] Secondary Etiology: [3:Infection - not elsewhere classified] [N/A:N/A] Comorbid History: [3:Asthma, Chronic Obstructive Pulmonary Disease (COPD), Congestive Heart Failure, Hypertension, Type II Diabetes, Osteoarthritis, Neuropathy] [N/A:N/A] Date Acquired: [3:06/14/2016] [N/A:N/A] Weeks of Treatment: [3:15] [N/A:N/A] Wound Status: [3:Open] [N/A:N/A] Pending Amputation on Yes [N/A:N/A] Presentation: Measurements L x W x D 0.2x0.2x0.1 [N/A:N/A] (cm) Area (cm) : [3:0.031] [N/A:N/A] Volume (cm) : [3:0.003] [N/A:N/A] % Reduction in Area: [3:99.50%] [N/A:N/A] % Reduction  in Volume: 99.50% [N/A:N/A] Classification: [3:Grade 1] [N/A:N/A] Exudate Amount: [3:Medium] [N/A:N/A] Exudate Type: [3:Serous] [N/A:N/A] Exudate Color: [3:amber] [N/A:N/A] Wound Margin: [3:Distinct, outline attached] [N/A:N/A] Granulation Amount: Medium (34-66%) N/A N/A Granulation Quality: Pink, Pale N/A N/A Necrotic Amount: Medium (34-66%) N/A N/A Exposed Structures: Fat Layer (Subcutaneous N/A N/A Tissue) Exposed: Yes Fascia: No Tendon: No Muscle: No Joint: No Bone: No Epithelialization: Large (67-100%) N/A N/A Debridement: Debridement ([id number](11042- N/A N/A 11047) Pre-procedure 14:50 N/A N/A Verification/Time Out Taken: Pain Control: Lidocaine 4% Topical N/A N/A Solution Tissue Debrided: Fibrin/Slough, Exudates, N/A N/A Subcutaneous Level: Skin/Subcutaneous N/A N/A Tissue Debridement Area (sq 0.04 N/A N/A cm): Instrument: Curette N/A N/A Bleeding: Minimum N/A N/A Hemostasis Achieved: Pressure N/A N/A Procedural Pain: 0 N/A N/A Post Procedural Pain: 0 N/A  N/A Debridement Treatment Procedure was tolerated N/A N/A Response: well Post Debridement 0.4x0.2x0.2 N/A N/A Measurements L x W x D (cm) Post Debridement 0.013 N/A N/A Volume: (cm) Periwound Skin Texture: Callus: Yes N/A N/A Excoriation: No Induration: No Crepitus: No Rash: No Scarring: No Periwound Skin Maceration: No N/A N/A Moisture: Dry/Scaly: No Periwound Skin Color: Atrophie Blanche: No N/A N/A Cyanosis: No Ecchymosis: No Erythema: No Hemosiderin Staining: No Mottled: No Kazee, Taylorann J. ([id number]) Pallor: No Rubor: No Temperature: No Abnormality N/A N/A Tenderness on Yes N/A N/A Palpation: Wound Preparation: Ulcer Cleansing: N/A N/A Rinsed/Irrigated with Saline Topical Anesthetic Applied: Other: lidocaine 4% Procedures Performed: Debridement N/A N/A Treatment Notes Wound #3 (Left, Medial Toe Great) 1. Cleansed with: Clean wound with Normal Saline 2. Anesthetic Topical Lidocaine 4% cream to wound bed prior to debridement 4. Dressing Applied: Prisma Ag 5. Secondary Dressing Applied Dry Gauze Kerlix/Conform 7. Secured with Tape Notes [employer] Electronic Signature(s) Signed: 10/25/2016 3:30:29 PM By: Evlyn KannerBritto, Errol MD, FACS Entered By: Evlyn KannerBritto, Errol on 10/25/2016 15:30:29 Hakala, [name] ([id number]) -------------------------------------------------------------------------------- Multi-Disciplinary Care Plan Details Patient Name: Kathleen PiggRRIS, Kash J. Date of Service: 10/25/2016 2:30 PM Medical Record Number: [id number] Patient Account Number: [id number] Date of Birth/Sex: June 28, 1950 (66 y.o. Female) Treating RN: Ashok CordiaPinkerton, [name] Primary Care Merl Guardino: Tonia GhentBENDER, ABBY Other Clinician: Referring Bertha Earwood: Tonia GhentBENDER, ABBY Treating Bary Limbach/Extender: Rudene ReBritto, Errol Weeks in Treatment: 15 Active Inactive ` Abuse / Safety / Falls / Self Care Management Nursing Diagnoses: Potential for falls Goals: Patient will remain injury free Date Initiated:  07/12/2016 Target Resolution Date: 10/26/2016 Goal Status: Active Interventions: Assess fall risk on admission and as needed Assess impairment of mobility on admission and as needed per policy Notes: ` Nutrition Nursing Diagnoses: Imbalanced nutrition Impaired glucose control: actual or potential Potential for alteratiion in Nutrition/Potential for imbalanced nutrition Goals: Patient/caregiver agrees to and verbalizes understanding of need to use nutritional supplements and/or vitamins as prescribed Date Initiated: 07/12/2016 Target Resolution Date: 09/28/2016 Goal Status: Active Interventions: Assess patient nutrition upon admission and as needed per policy Provide education on elevated blood sugars and impact on wound healing Notes: [name]` Grist, Liya J. ([id number]) Orientation to the Wound Care Program Nursing Diagnoses: Knowledge deficit related to the wound healing center program Goals: Patient/caregiver will verbalize understanding of the Wound Healing Center Program Date Initiated: 07/12/2016 Target Resolution Date: 07/27/2016 Goal Status: Active Interventions: Provide education on orientation to the wound center Notes: ` Pain, Acute or Chronic Nursing Diagnoses: Pain, acute or chronic: actual or potential Potential alteration in comfort, pain Goals: Patient/caregiver will verbalize adequate pain control between visits Date Initiated: 07/12/2016 Target Resolution Date: 10/26/2016 Goal Status: Active Interventions: Assess comfort goal upon admission Complete pain assessment as per visit requirements Notes: `  Soft Tissue Infection Nursing Diagnoses: Impaired tissue integrity Knowledge deficit related to disease process and management Knowledge deficit related to home infection control: handwashing, handling of soiled dressings, supply storage Goals: Patient/caregiver will verbalize understanding of or measures to prevent infection and contamination in the home  setting Date Initiated: 07/12/2016 Target Resolution Date: 09/28/2016 Goal Status: Active LAKEITHA, BASQUES ([id number]) Interventions: Assess signs and symptoms of infection every visit Notes: ` Wound/Skin Impairment Nursing Diagnoses: Impaired tissue integrity Knowledge deficit related to smoking impact on wound healing Knowledge deficit related to ulceration/compromised skin integrity Goals: Ulcer/skin breakdown will have a volume reduction of 80% by week 12 Date Initiated: 07/12/2016 Target Resolution Date: 10/26/2016 Goal Status: Active Interventions: Assess patient/caregiver ability to perform ulcer/skin care regimen upon admission and as needed Assess ulceration(s) every visit Provide education on smoking Notes: Electronic Signature(s) Signed: 10/25/2016 4:34:22 PM By: Alejandro Mulling Entered By: Alejandro Mulling on 10/25/2016 14:49:38 Mudgett, Fallen J. ([id number]) -------------------------------------------------------------------------------- Pain Assessment Details Patient Name: [name]. Date of Service: 10/25/2016 2:30 PM Medical Record Number: [id number] Patient Account Number: [id number] Date of Birth/Sex: 1950-09-07 (66 y.o. Female) Treating RN: Ashok Cordia, [name] Primary Care Reade Trefz: Tonia Ghent Other Clinician: Referring Darrall Strey: Tonia Ghent Treating Zareen Jamison/Extender: Rudene Re in Treatment: 15 Active Problems Location of Pain Severity and Description of Pain Patient Has Paino No Site Locations With Dressing Change: No Pain Management and Medication Current Pain Management: Electronic Signature(s) Signed: 10/25/2016 4:34:22 PM By: Alejandro Mulling Entered By: Alejandro Mulling on 10/25/2016 14:43:01 Curd, [name] ([id number]) -------------------------------------------------------------------------------- Patient/Caregiver Education Details Patient Name: [name]. Date of Service: 10/25/2016 2:30 PM Medical Record Number: [id number] Patient  Account Number: [id number] Date of Birth/Gender: 01-Mar-1951 (66 y.o. Female) Treating RN: Ashok Cordia, [name] Primary Care Physician: Tonia Ghent Other Clinician: Referring Physician: Tonia Ghent Treating Physician/Extender: Rudene Re in Treatment: 15 Education Assessment Education Provided To: Patient Education Topics Provided Wound/Skin Impairment: Handouts: Other: change dressing as ordered Methods: Demonstration, Explain/Verbal Responses: State content correctly Electronic Signature(s) Signed: 10/25/2016 4:34:22 PM By: Alejandro Mulling Entered By: Alejandro Mulling on 10/25/2016 15:00:27 Cahue, Velita J. ([id number]) -------------------------------------------------------------------------------- Wound Assessment Details Patient Name: [name]. Date of Service: 10/25/2016 2:30 PM Medical Record Number: [id number] Patient Account Number: [id number] Date of Birth/Sex: October 02, 1950 (66 y.o. Female) Treating RN: Ashok Cordia, [name] Primary Care Ashten Prats: Tonia Ghent Other Clinician: Referring Barbara Ahart: Tonia Ghent Treating Jasmia Angst/Extender: Rudene Re in Treatment: 15 Wound Status Wound Number: 3 Primary Diabetic Wound/Ulcer of the Lower Etiology: Extremity Wound Location: Left Toe Great - Medial Secondary Infection - not elsewhere classified Wounding Event: Gradually Appeared Etiology: Date Acquired: 06/14/2016 Wound Open Weeks Of Treatment: 15 Status: Clustered Wound: No Comorbid Asthma, Chronic Obstructive Pending Amputation On Presentation History: Pulmonary Disease (COPD), Congestive Heart Failure, Hypertension, Type II Diabetes, Osteoarthritis, Neuropathy Photos Photo Uploaded By: Alejandro Mulling on 10/25/2016 16:32:39 Wound Measurements Length: (cm) 0.2 Width: (cm) 0.2 Depth: (cm) 0.1 Area: (cm) 0.031 Volume: (cm) 0.003 % Reduction in Area: 99.5% % Reduction in Volume: 99.5% Epithelialization: Large (67-100%) Tunneling:  No Undermining: No Wound Description Classification: Grade 1 Wound Margin: Distinct, outline attached Exudate Amount: Medium Exudate Type: Serous Exudate Color: amber Foul Odor After Cleansing: No Slough/Fibrino Yes Wound Bed Rivkin, Lorita J. ([id number]) Granulation Amount: Medium (34-66%) Exposed Structure Granulation Quality: Pink, Pale Fascia Exposed: No Necrotic Amount: Medium (34-66%) Fat Layer (Subcutaneous Tissue) Exposed: Yes Necrotic Quality: Adherent Slough Tendon Exposed: No Muscle Exposed: No Joint Exposed: No Bone Exposed: No Periwound Skin Texture Texture Color No Abnormalities  Noted: No No Abnormalities Noted: No Callus: Yes Atrophie Blanche: No Crepitus: No Cyanosis: No Excoriation: No Ecchymosis: No Induration: No Erythema: No Rash: No Hemosiderin Staining: No Scarring: No Mottled: No Pallor: No Moisture Rubor: No No Abnormalities Noted: No Dry / Scaly: No Temperature / Pain Maceration: No Temperature: No Abnormality Tenderness on Palpation: Yes Wound Preparation Ulcer Cleansing: Rinsed/Irrigated with Saline Topical Anesthetic Applied: Other: lidocaine 4%, Treatment Notes Wound #3 (Left, Medial Toe Great) 1. Cleansed with: Clean wound with Normal Saline 2. Anesthetic Topical Lidocaine 4% cream to wound bed prior to debridement 4. Dressing Applied: Prisma Ag 5. Secondary Dressing Applied Dry Gauze Kerlix/Conform 7. Secured with Tape Notes [employer] Electronic Signature(s) Signed: 10/25/2016 4:34:22 PM By: Alejandro MullingPinkerton, Debra Entered By: Alejandro MullingPinkerton, Debra on 10/25/2016 14:47:03 Grandinetti, Angelynn J. ([id number]) Tramontana, Justeen JMarland Kitchen. ([id number]) -------------------------------------------------------------------------------- Vitals Details Patient Name: Kathleen PiggPARRIS, Yuriko J. Date of Service: 10/25/2016 2:30 PM Medical Record Number: [id number] Patient Account Number: [id number] Date of Birth/Sex: 01/18/1951 (66 y.o. Female) Treating RN: Ashok CordiaPinkerton,  [name] Primary Care Bernadette Gores: Tonia GhentBENDER, ABBY Other Clinician: Referring Alinah Sheard: Tonia GhentBENDER, ABBY Treating Ymani Porcher/Extender: Rudene ReBritto, Errol Weeks in Treatment: 15 Vital Signs Time Taken: 14:43 Temperature (F): 98.2 Height (in): 63 Pulse (bpm): 84 Weight (lbs): 224 Respiratory Rate (breaths/min): 18 Body Mass Index (BMI): 39.7 Blood Pressure (mmHg): 168/87 Reference Range: 80 - 120 mg / dl Electronic Signature(s) Signed: 10/25/2016 4:34:22 PM By: Alejandro MullingPinkerton, Debra Entered By: Alejandro MullingPinkerton, Debra on 10/25/2016 14:43:43

## 2016-10-27 NOTE — Progress Notes (Signed)
[name], [name] ([id number]) Visit Report for 10/25/2016 Chief Complaint Document Details Patient Name: [name], [name]. Date of Service: 10/25/2016 2:30 PM Medical Record Number: [id number] Patient Account Number: [id number] Date of Birth/Sex: 1950/03/28 (66 y.o. Female) Treating RN: Ashok Cordia, [name] Primary Care Provider: Tonia Ghent Other Clinician: Referring Provider: Tonia Ghent Treating Provider/Extender: Rudene Re in Treatment: 15 Information Obtained from: Patient Chief Complaint [name] presents today for evaluation of her left medial hallux and left lateral lower extremity ulcers Electronic Signature(s) Signed: 10/25/2016 3:30:47 PM By: Evlyn Kanner MD, FACS Entered By: Evlyn Kanner on 10/25/2016 15:30:47 [name], [name] ([id number]) -------------------------------------------------------------------------------- Debridement Details Patient Name: [name]. Date of Service: 10/25/2016 2:30 PM Medical Record Number: [id number] Patient Account Number: [id number] Date of Birth/Sex: 1951-03-05 (66 y.o. Female) Treating RN: Ashok Cordia, [name] Primary Care Provider: Tonia Ghent Other Clinician: Referring Provider: Tonia Ghent Treating Provider/Extender: Rudene Re in Treatment: 15 Debridement Performed for Wound #3 Left,Medial Toe Great Assessment: Performed By: Physician Evlyn Kanner, MD Debridement: Debridement Severity of Tissue Pre Fat layer exposed Debridement: Pre-procedure Verification/Time Out Yes - 14:50 Taken: Start Time: 14:51 Pain Control: Lidocaine 4% Topical Solution Level: Skin/Subcutaneous Tissue Total Area Debrided (L x 0.2 (cm) x 0.2 (cm) = 0.04 (cm) W): Tissue and other Viable, Non-Viable, Exudate, Fibrin/Slough, Subcutaneous material debrided: Instrument: Curette Bleeding: Minimum Hemostasis Achieved: Pressure End Time: 14:53 Procedural Pain: 0 Post Procedural Pain: 0 Response to Treatment: Procedure was tolerated  well Post Debridement Measurements of Total Wound Length: (cm) 0.4 Width: (cm) 0.2 Depth: (cm) 0.2 Volume: (cm) 0.013 Character of Wound/Ulcer Post Requires Further Debridement Debridement: Severity of Tissue Post Debridement: Fat layer exposed Post Procedure Diagnosis Same as Pre-procedure Electronic Signature(s) Signed: 10/25/2016 3:30:41 PM By: Evlyn Kanner MD, FACS Signed: 10/25/2016 4:34:22 PM By: Hollie Beach, Cashae J. ([id number]) Entered By: Evlyn Kanner on 10/25/2016 15:30:40 [name], [name] ([id number]) -------------------------------------------------------------------------------- HPI Details Patient Name: [name], [name]. Date of Service: 10/25/2016 2:30 PM Medical Record Number: [id number] Patient Account Number: [id number] Date of Birth/Sex: [birth year] (66 y.o. Female) Treating RN: Ashok Cordia, [name] Primary Care Provider: Tonia Ghent Other Clinician: Referring Provider: Tonia Ghent Treating Provider/Extender: Rudene Re in Treatment: 15 History of Present Illness Location: left medial hallux; LLE lateral Duration: left medial hallux-4 weeks; LLE lateral-2-1/2 weeks Timing: left medial hallux-pain with pressure/standing, ambulating, manipulation; LLE lateral-pain with manipulation Context: left medial hallux-originally was a crack in the skin; LLE lateral-traumatic injury with car door Modifying Factors: uncontrolled diabetes and smoking are exacerbating factors to wound healing HPI Description: 07/12/16- the patient arrives for initial evaluation of a left hallux and left lower extremity lateral ulcer. She states that the left hallux originally had a crack in the skin which she began to apply AandD ointment. She went to her PCP at [facility] on 4/4, while going into her appointment she struck her left lateral leg on the car door. At that appointment she was given a prescription for Bactrim and completed that on 4/14 with no adverse  reaction. She states that it made little to no improvement in her toe wound. She went back for a follow-up visit on 4/18 and was prescribed doxycycline for 10 days. She continues to apply AandD topical ointment along with soaking her foot in warm water. She is a diabetic, most recent A1c of 9% (I do not have records of this, this is per patient report). She is both on oral agents and insulin. She is a current smoker, approximately 0.5  ppd. She denies any remote or recent evaluation regarding arterial or venous disease. 07/19/16 Patient presents today for follow-up evaluation concerning her left great toe wound immediately and left lateral lower extremity wound. Unfortunately though both wounds appear better visually she is having a lot of discomfort. I'm unsure if this is a preformed drop of the issue or potentially the ideas or appointment is causing irritation which has caused her discomfort and pain. The good news is her wound culture came back negative and showed only normal skin flora and her x-ray of the left foot was also negative for any osseous change or osteomyelitis. Unfortunately she continues to have a lot of discomfort she would rate it a seven out of 10 right now. 07/26/2016 -- she is doing much better since the Iodoflex was stopped and she is on Santyl. Her culture report was negative and only had normal skin flora. She continues to smoke about a half pack of cigarettes a day. 08/05/2016 -- she is doing much better with her smoking and is down to about 5 cigarettes a day. The left lower extremity wound is healed and she continues to have a lot of tenderness on her left big toe. In the course of conversation we realize she was using alcohol to clean her left big toe and we have told her to stop this. 09/16/16 on evaluation today patient appears to be doing better in regard to her left first toe wound. With that being said she is having some discomfort at the proximal aspect which is  worrisome for potential infection although there is not a significant amount of erythema noted at this point. She states this is new and she has not had pain like this up to this point. Fortunately she has no red streaks upper leg and no evidence of more extensive cellulitis. The wound is also measuring smaller. [name], Kiannah J. ([id number]) 10/04/16 on evaluation today patient appears to be doing fairly well in regard to her left first toe ulcer. She does have some Slough covering unfortunately she is not having as much discomfort she still has a little bit of discomfort at the proximal portion of the wound there is no evidence of erythema or infection. 10/11/2016 -- she says she has completely given up smoking for the last 2 days and I have commended her on this Electronic Signature(s) Signed: 10/25/2016 3:31:08 PM By: Evlyn KannerBritto, Dionne Knoop MD, FACS Entered By: Evlyn KannerBritto, Ell Tiso on 10/25/2016 15:31:08 [name], [name] ([id number]) -------------------------------------------------------------------------------- Physical Exam Details Patient Name: [name], [name] Date of Service: 10/25/2016 2:30 PM Medical Record Number: [id number] Patient Account Number: [id number] Date of Birth/Sex: 1950-06-09 (66 y.o. Female) Treating RN: Ashok CordiaPinkerton, [name] Primary Care Provider: Tonia GhentBENDER, ABBY Other Clinician: Referring Provider: Tonia GhentBENDER, ABBY Treating Provider/Extender: Rudene ReBritto, Riyaan Heroux Weeks in Treatment: 15 Constitutional . Pulse regular. Respirations normal and unlabored. Afebrile. . Eyes Nonicteric. Reactive to light. Ears, Nose, Mouth, and Throat Lips, teeth, and gums WNL.Marland Kitchen. Moist mucosa without lesions. Neck supple and nontender. No palpable supraclavicular or cervical adenopathy. Normal sized without goiter. Respiratory WNL. No retractions.. Cardiovascular Pedal Pulses WNL. No clubbing, cyanosis or edema. Lymphatic No adneopathy. No adenopathy. No adenopathy. Musculoskeletal Adexa without tenderness or  enlargement.. Digits and nails w/o clubbing, cyanosis, infection, petechiae, ischemia, or inflammatory conditions.. Integumentary (Hair, Skin) No suspicious lesions. No crepitus or fluctuance. No peri-wound warmth or erythema. No masses.Marland Kitchen. Psychiatric Judgement and insight Intact.. No evidence of depression, anxiety, or agitation.. Notes the wound needed some sharp debridement today and are using a #  3 curet I removed all the eschar and subcutaneous tenderness debris and minimal bleeding controlled with pressure Electronic Signature(s) Signed: 10/25/2016 3:31:44 PM By: Evlyn Kanner MD, FACS Entered By: Evlyn Kanner on 10/25/2016 15:31:44 [name], [name] -------------------------------------------------------------------------------- Physician Orders Details Patient Name: [name]. Date of Service: 10/25/2016 2:30 PM Medical Record Number: [id number] Patient Account Number: [id number] Date of Birth/Sex: [birth year] (66 y.o. Female) Treating RN: Ashok Cordia, [name] Primary Care Provider: Tonia Ghent Other Clinician: Referring Provider: Tonia Ghent Treating Provider/Extender: Rudene Re in Treatment: 15 Verbal / Phone Orders: Yes Clinician: Ashok Cordia, [name] Read Back and Verified: Yes Diagnosis Coding Wound Cleansing Wound #3 Left,Medial Toe Great o Clean wound with Normal Saline. o Cleanse wound with mild soap and water Anesthetic Wound #3 Left,Medial Toe Great o Topical Lidocaine 4% cream applied to wound bed prior to debridement Primary Wound Dressing Wound #3 Left,Medial Toe Great o Prisma Ag Secondary Dressing Wound #3 Left,Medial Toe Great o Dry Gauze o Gauze and Kerlix/Conform o Other - tape Dressing Change Frequency Wound #3 Left,Medial Toe Great o Change dressing every day. Follow-up Appointments Wound #3 Left,Medial Toe Great o Return Appointment in 1 week. Edema Control Wound #3 Left,Medial Toe Great o Elevate legs to  the level of the heart and pump ankles as often as possible Additional Orders / Instructions Wound #3 Left,Medial Toe Great o Stop Smoking o Increase protein intake. TASHIANA, LAMARCA ([id number]) Electronic Signature(s) Signed: 10/25/2016 4:02:51 PM By: Evlyn Kanner MD, FACS Signed: 10/25/2016 4:34:22 PM By: Alejandro Mulling Entered By: Alejandro Mulling on 10/25/2016 14:53:10 [name], [name] ([id number]) -------------------------------------------------------------------------------- Problem List Details Patient Name: [name], [name]. Date of Service: 10/25/2016 2:30 PM Medical Record Number: [id number] Patient Account Number: [id number] Date of Birth/Sex: [birth year] (66 y.o. 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 ([id number]) -------------------------------------------------------------------------------- Progress Note Details Patient Name: [name]. Date of Service: 10/25/2016 2:30  PM Medical Record Number: [id number] Patient Account Number: [id number] Date of Birth/Sex: 02-21-1951 (66 y.o. Female) Treating RN: Ashok Cordia, [name] Primary Care Provider: Tonia Ghent Other Clinician: Referring Provider: Tonia Ghent Treating Provider/Extender: Rudene Re in Treatment: 15 Subjective Chief Complaint Information obtained from Patient Mrs. Cataldi presents today for evaluation of her left medial hallux and left lateral lower extremity ulcers History of Present Illness (HPI) The following HPI elements were documented for the patient's wound: Location: left medial hallux; LLE lateral Duration: left medial hallux-4 weeks; LLE lateral-2-1/2 weeks Timing: left medial hallux-pain with pressure/standing, ambulating, manipulation; LLE lateral-pain with manipulation Context: left medial hallux-originally was a crack in the skin; LLE lateral-traumatic injury with car door Modifying Factors: uncontrolled diabetes and smoking are exacerbating factors to wound healing 07/12/16- the  patient arrives for initial evaluation of a left hallux and left lower extremity lateral ulcer. She states that the left hallux originally had a crack in the skin which she began to apply AandD ointment. [facility], while going into her appointment she struck her left lateral leg on the car door. At that appointment she was given a prescription for Bactrim and completed that on 4/14 with no adverse reaction. She states that it made little to no improvement in her toe wound. She went back for a follow-up visit on 4/18 and was prescribed doxycycline for 10 days. She continues to apply AandD topical ointment along with soaking her foot in warm water. She is a diabetic, most recent A1c of 9% (I do not have records of this, this is per patient report). She is both on oral agents and insulin. She is a current smoker, approximately 0.5 ppd. She denies any remote  or recent evaluation regarding arterial or venous disease. 07/19/16 Patient presents today for follow-up evaluation concerning her left great toe wound immediately and left lateral lower extremity wound. Unfortunately though both wounds appear better visually she is having a lot of discomfort. I'm unsure if this is a preformed drop of the issue or potentially the ideas or appointment is causing irritation which has caused her discomfort and pain. The good news is her wound culture came back negative and showed only normal skin flora and her x-ray of the left foot was also negative for any osseous change or osteomyelitis. Unfortunately she continues to have a lot of discomfort she would rate it a seven out of 10 right now. 07/26/2016 -- she is doing much better since the Iodoflex was stopped and she is on Santyl. Her culture report was negative and only had normal skin flora. She continues to smoke about a half pack of cigarettes a day. 08/05/2016 -- she is doing much better with her smoking and is down to about 5 cigarettes a day. The left lower extremity wound is healed and she continues to have a lot of tenderness on her left big toe. In the [name], Barbette J. ([id number]) course of conversation we realize she was using alcohol to clean her left big toe and we have told her to stop this. 09/16/16 on evaluation today patient appears to be doing better in regard to her left first toe wound. With that being said she is having some discomfort at the proximal aspect which is worrisome for potential infection although there is not a significant amount of erythema noted at this point. She states this is new and she has not had pain like this up to this point. Fortunately she has no red streaks upper leg and no evidence of more extensive cellulitis. The wound is also measuring smaller. 10/04/16 on evaluation today patient appears to be doing fairly well in regard to her left first toe ulcer. She does have  some Slough covering unfortunately she is not having as much discomfort she still has a little bit of discomfort at the proximal portion of the wound there is no evidence of erythema or infection. 10/11/2016 -- she says she has completely given up smoking for the last 2 days and I have commended her on this Objective Constitutional Pulse regular. Respirations normal and unlabored. Afebrile. Vitals Time Taken: 2:43 PM, Height: 63 in, Weight: 224 lbs, BMI: 39.7, Temperature: 98.2 F, Pulse: 84 bpm, Respiratory Rate: 18 breaths/min, Blood Pressure: 168/87 mmHg.  Eyes Nonicteric. Reactive to light. Ears, Nose, Mouth, and Throat Lips, teeth, and gums WNL.Marland Kitchen Moist mucosa without lesions. Neck supple and nontender. No palpable supraclavicular or cervical adenopathy. Normal sized without goiter. Respiratory WNL. No retractions.. Cardiovascular Pedal Pulses WNL. No clubbing, cyanosis or edema. Lymphatic No adneopathy. No adenopathy. No adenopathy. [name], [name] ([id number]) Musculoskeletal Adexa without tenderness or enlargement.. Digits and nails w/o clubbing, cyanosis, infection, petechiae, ischemia, or inflammatory conditions.Marland Kitchen Psychiatric Judgement and insight Intact.. No evidence of depression, anxiety, or agitation.. General Notes: the wound needed some sharp debridement today and are using a #3 curet I removed all the eschar and subcutaneous tenderness debris and minimal bleeding controlled with pressure Integumentary (Hair, Skin) No suspicious lesions. No crepitus or fluctuance. No peri-wound warmth or erythema. No masses.. Wound #3 status is Open. Original cause of wound was Gradually Appeared. The wound is located on the [employer]. The wound measures 0.2cm length x 0.2cm width x 0.1cm depth; 0.031cm^2 area and 0.003cm^3 volume. There is Fat Layer (Subcutaneous Tissue) Exposed exposed. There is no tunneling or undermining noted. There is a medium amount of serous  drainage noted. The wound margin is distinct with the outline attached to the wound base. There is medium (34-66%) pink, pale granulation within the wound bed. There is a medium (34-66%) amount of necrotic tissue within the wound bed including Adherent Slough. The periwound skin appearance exhibited: Callus. The periwound skin appearance did not exhibit: Crepitus, Excoriation, Induration, Rash, Scarring, Dry/Scaly, Maceration, Atrophie Blanche, Cyanosis, Ecchymosis, Hemosiderin Staining, Mottled, Pallor, Rubor, Erythema. Periwound temperature was noted as No Abnormality. The periwound has tenderness on palpation. Assessment Active Problems ICD-10 E11.621 - Type 2 diabetes mellitus with foot ulcer E11.622 - Type 2 diabetes mellitus with other skin ulcer F17.218 - Nicotine dependence, cigarettes, with other nicotine-induced disorders L97.522 - Non-pressure chronic ulcer of other part of left foot with fat layer exposed Procedures Wound #3 Pre-procedure diagnosis of Wound #3 is a Diabetic Wound/Ulcer of the Lower Extremity located on the Left,Medial Toe Great .Severity of Tissue Pre Debridement is: Fat layer exposed. There was a Skin/Subcutaneous Tissue Debridement ([id number]) debridement with total area of 0.04 sq cm [name], [name] ([id number]) performed by Evlyn Kanner, MD. with the following instrument(s): Curette to remove Viable and Non-Viable tissue/material including Exudate, Fibrin/Slough, and Subcutaneous after achieving pain control using Lidocaine 4% Topical Solution. A time out was conducted at 14:50, prior to the start of the procedure. A Minimum amount of bleeding was controlled with Pressure. The procedure was tolerated well with a pain level of 0 throughout and a pain level of 0 following the procedure. Post Debridement Measurements: 0.4cm length x 0.2cm width x 0.2cm depth; 0.013cm^3 volume. Character of Wound/Ulcer Post Debridement requires further debridement. Severity of  Tissue Post Debridement is: Fat layer exposed. Post procedure Diagnosis Wound #3: Same as Pre-Procedure Plan Wound Cleansing: Wound #3 Left,Medial Toe Great: Clean wound with Normal Saline. Cleanse wound with mild soap and water Anesthetic: Wound #3 Left,Medial Toe Great: Topical Lidocaine 4% cream applied to wound bed prior to debridement Primary Wound Dressing: Wound #3 Left,Medial Toe Great: Prisma Ag Secondary Dressing: Wound #3 Left,Medial Toe Great: Dry Gauze Gauze and Kerlix/Conform Other - tape Dressing Change Frequency: Wound #3 Left,Medial Toe Great: Change dressing every day. Follow-up Appointments: Wound #3 Left,Medial Toe Great: Return Appointment in 1 week. Edema Control: Wound #3 Left,Medial Toe Great: Elevate legs to the level of the heart and pump ankles as often as possible Additional Orders /  Instructions: Wound #3 Left,Medial Toe Great: Stop Smoking Increase protein intake. [name], [name] After sharp debridement today, I have recommended: 1. Silver collagen to be continued to the left big toe alternate days. 2. continue to stay off cigarette smoking -- I have commended her on giving up 3. Adequate protein, vitamin A, vitamin C and zinc 4. regular visits the wound center Electronic Signature(s) Signed: 10/25/2016 3:33:02 PM By: Evlyn Kanner MD, FACS Entered By: Evlyn Kanner on 10/25/2016 15:33:01 Trivedi, [name] ([id number]) -------------------------------------------------------------------------------- SuperBill Details Patient Name: [name]. Date of Service: 10/25/2016 Medical Record Number: [id number] Patient Account Number: [id number] Date of Birth/Sex: [birth year] (66 y.o. Female) Treating RN: Ashok Cordia, [name] Primary Care Provider: Tonia Ghent Other Clinician: Referring Provider: Tonia Ghent Treating Provider/Extender: Rudene Re in Treatment: 15 Diagnosis Coding ICD-10 Codes Code Description 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 2  diabetes mellitus with foot ulcer E11.622 Type 2 diabetes mellitus with other skin ulcer F17.218 Nicotine dependence, cigarettes, with other nicotine-induced disorders L97.522 Non-pressure chronic ulcer of other part of left foot with fat layer exposed Facility Procedures CPT4 Code Description: [id number] 11042 - DEB SUBQ TISSUE 20 SQ CM/< ICD-10 Description Diagnosis 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 2 diabetes mellitus with foot ulcer F17.218 Nicotine dependence, cigarettes, with other nicotine- L97.522 Non-pressure chronic ulcer of other  part of left foot Modifier: induced disor with fat lay Quantity: 1 ders er exposed Physician Procedures CPT4 Code Description: [id number] 11042 - WC PHYS SUBQ TISS 20 SQ CM ICD-10 Description Diagnosis 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 2 diabetes mellitus with foot ulcer F17.218 Nicotine dependence, cigarettes, with other nicotine- L97.522 Non-pressure chronic ulcer of other part  of left foot Modifier: induced disord with fat laye Quantity: 1 ers r exposed Electronic Signature(s) Signed: 10/25/2016 3:33:15 PM By: Evlyn Kanner MD, FACS Entered By: Evlyn Kanner on 10/25/2016 15:33:14

## 2016-11-04 ENCOUNTER — Encounter: Payer: Medicare HMO | Admitting: Nurse Practitioner

## 2016-11-04 DIAGNOSIS — E11621 Type 2 diabetes mellitus with foot ulcer: Secondary | ICD-10-CM | POA: Diagnosis not present

## 2016-11-05 NOTE — Progress Notes (Signed)
Kathleen, [name] ([id number]) Visit Report for 11/04/2016 Arrival Information Details Patient Name: [name], [name]. Date of Service: 11/04/2016 2:30 PM Medical Record Number: [id number] Patient Account Number: [id number] Date of Birth/Sex: 06-29-1950 (66 y.o. Female) Treating RN: Huel Coventry Primary Care [name] Langsam: Tonia Ghent Other Clinician: Referring Artelia Game: Tonia Ghent Treating Travonta Gill/Extender: Kathreen Cosier in Treatment: 16 Visit Information History Since Last Visit Added or deleted any medications: No Patient Arrived: Ambulatory Any new allergies or adverse reactions: No Arrival Time: 14:49 Had a fall or experienced change in No Accompanied By: self activities of daily living that may affect Transfer Assistance: None risk of falls: Patient Identification Verified: Yes Signs or symptoms of abuse/neglect since last No Secondary Verification Process Yes visito Completed: Hospitalized since last visit: No Patient Requires Transmission-Based No Has Dressing in Place as Prescribed: Yes Precautions: Pain Present Now: No Patient Has Alerts: Yes Patient Alerts: Dm II Electronic Signature(s) Signed: 11/05/2016 7:32:57 AM By: Elliot Gurney, BSN, RN, CWS, [name], BSN Entered By: Elliot Gurney, BSN, RN, CWS, Kim on 11/04/2016 14:49:43 Roe, [name] ([id number]) -------------------------------------------------------------------------------- Clinic Level of Care Assessment Details Patient Name: [name], [name]. Date of Service: 11/04/2016 2:30 PM Medical Record Number: [id number] Patient Account Number: [id number] Date of Birth/Sex: 1950-07-09 (66 y.o. Female) Treating RN: Huel Coventry Primary Care Elvia Aydin: Tonia Ghent Other Clinician: Referring Rahkeem Senft: Tonia Ghent Treating Estreya Clay/Extender: Kathreen Cosier in Treatment: 16 Clinic Level of Care Assessment Items TOOL 4 Quantity Score []  - Use when only an EandM is performed on FOLLOW-UP visit 0 ASSESSMENTS - Nursing Assessment /  Reassessment []  - Reassessment of Co-morbidities (includes updates in patient status) 0 X - Reassessment of Adherence to Treatment Plan 1 5 ASSESSMENTS - Wound and Skin Assessment / Reassessment X - Simple Wound Assessment / Reassessment - one wound 1 5 []  - Complex Wound Assessment / Reassessment - multiple wounds 0 []  - Dermatologic / Skin Assessment (not related to wound area) 0 ASSESSMENTS - Focused Assessment []  - Circumferential Edema Measurements - multi extremities 0 []  - Nutritional Assessment / Counseling / Intervention 0 []  - Lower Extremity Assessment (monofilament, tuning fork, pulses) 0 []  - Peripheral Arterial Disease Assessment (using hand held doppler) 0 ASSESSMENTS - Ostomy and/or Continence Assessment and Care []  - Incontinence Assessment and Management 0 []  - Ostomy Care Assessment and Management (repouching, etc.) 0 PROCESS - Coordination of Care X - Simple Patient / Family Education for ongoing care 1 15 []  - Complex (extensive) Patient / Family Education for ongoing care 0 []  - Staff obtains Chiropractor, Records, Test Results / Process Orders 0 []  - Staff telephones HHA, Nursing Homes / Clarify orders / etc 0 []  - Routine Transfer to another Facility (non-emergent condition) 0 Mccarver, Saydi J. ([id number]) []  - Routine Hospital Admission (non-emergent condition) 0 []  - New Admissions / Manufacturing engineer / Ordering NPWT, Apligraf, etc. 0 []  - Emergency Hospital Admission (emergent condition) 0 X - Simple Discharge Coordination 1 10 []  - Complex (extensive) Discharge Coordination 0 PROCESS - Special Needs []  - Pediatric / Minor Patient Management 0 []  - Isolation Patient Management 0 []  - Hearing / Language / Visual special needs 0 []  - Assessment of Community assistance (transportation, D/C planning, etc.) 0 []  - Additional assistance / Altered mentation 0 []  - Support Surface(s) Assessment (bed, cushion, seat, etc.) 0 INTERVENTIONS - Wound Cleansing /  Measurement X - Simple Wound Cleansing - one wound 1 5 []  - Complex Wound Cleansing - multiple wounds 0 X - Wound Imaging (  photographs - any number of wounds) 1 5 []  - Wound Tracing (instead of photographs) 0 X - Simple Wound Measurement - one wound 1 5 []  - Complex Wound Measurement - multiple wounds 0 INTERVENTIONS - Wound Dressings X - Small Wound Dressing one or multiple wounds 1 10 []  - Medium Wound Dressing one or multiple wounds 0 []  - Large Wound Dressing one or multiple wounds 0 []  - Application of Medications - topical 0 []  - Application of Medications - injection 0 INTERVENTIONS - Miscellaneous []  - External ear exam 0 Michelini, Arnetha J. ([id number]) []  - Specimen Collection (cultures, biopsies, blood, body fluids, etc.) 0 []  - Specimen(s) / Culture(s) sent or taken to Lab for analysis 0 []  - Patient Transfer (multiple staff / Michiel Sites Lift / Similar devices) 0 []  - Simple Staple / Suture removal (25 or less) 0 []  - Complex Staple / Suture removal (26 or more) 0 []  - Hypo / Hyperglycemic Management (close monitor of Blood Glucose) 0 []  - Ankle / Brachial Index (ABI) - do not check if billed separately 0 X - Vital Signs 1 5 Has the patient been seen at the hospital within the last three years: Yes Total Score: 65 Level Of Care: New/Established - Level 2 Electronic Signature(s) Signed: 11/05/2016 7:32:57 AM By: Elliot Gurney, BSN, RN, CWS, [name], BSN Entered By: Elliot Gurney, BSN, RN, CWS, Kim on 11/04/2016 17:17:24 Royle, [name] ([id number]) -------------------------------------------------------------------------------- Encounter Discharge Information Details Patient Name: [name], [name]. Date of Service: 11/04/2016 2:30 PM Medical Record Number: [id number] Patient Account Number: [id number] Date of Birth/Sex: Apr 08, 1950 (66 y.o. Female) Treating RN: Ashok Cordia, [name] Primary Care Ladamien Rammel: Tonia Ghent Other Clinician: Referring Shamell Hittle: Tonia Ghent Treating Jarmel Linhardt/Extender: Kathreen Cosier in Treatment: 16 Encounter Discharge Information Items Discharge Pain Level: 0 Discharge Condition: Stable Ambulatory Status: Cane Discharge Destination: Home Transportation: Private Auto Accompanied By: self Schedule Follow-up Appointment: Yes Medication Reconciliation completed Yes and provided to Patient/Care Dmitri Pettigrew: Provided on Clinical Summary of Care: 11/04/2016 Form Type Recipient Paper Patient EP Electronic Signature(s) Signed: 11/05/2016 7:32:57 AM By: Elliot Gurney, BSN, RN, CWS, [name], BSN Entered By: Elliot Gurney, BSN, RN, CWS, Kim on 11/04/2016 15:07:32 Zadrozny, [name] ([id number]) -------------------------------------------------------------------------------- Lower Extremity Assessment Details Patient Name: [name], [name]. Date of Service: 11/04/2016 2:30 PM Medical Record Number: [id number] Patient Account Number: [id number] Date of Birth/Sex: 1951-03-22 (66 y.o. Female) Treating RN: Huel Coventry Primary Care Amedee Cerrone: Tonia Ghent Other Clinician: Referring Esty Ahuja: Tonia Ghent Treating Riggs Dineen/Extender: Kathreen Cosier in Treatment: 16 Vascular Assessment Pulses: Dorsalis Pedis Palpable: [Left:Yes] Posterior Tibial Extremity colors, hair growth, and conditions: Extremity Color: [Left:Normal] Hair Growth on Extremity: [Left:No] Temperature of Extremity: [Left:Warm] Capillary Refill: [Left:< 3 seconds] Dependent Rubor: [Left:No] Blanched when Elevated: [Left:No] Lipodermatosclerosis: [Left:No] Toe Nail Assessment Left: Right: Thick: No Discolored: No Deformed: No Improper Length and Hygiene: No Electronic Signature(s) Signed: 11/05/2016 7:32:57 AM By: Elliot Gurney, BSN, RN, CWS, [name], BSN Entered By: Elliot Gurney, BSN, RN, CWS, Kim on 11/04/2016 14:55:27 Masci, [name] ([id number]) -------------------------------------------------------------------------------- Multi Wound Chart Details Patient Name: [name]. Date of Service: 11/04/2016 2:30  PM Medical Record Number: [id number] Patient Account Number: [id number] Date of Birth/Sex: 1950-05-02 (66 y.o. Female) Treating RN: Huel Coventry Primary Care Vidhi Delellis: Tonia Ghent Other Clinician: Referring Draydon Clairmont: Tonia Ghent Treating Eutha Cude/Extender: Kathreen Cosier in Treatment: 16 Vital Signs Height(in): 63 Pulse(bpm): 84 Weight(lbs): 224 Blood Pressure 124/63 (mmHg): Body Mass Index(BMI): 40 Temperature(F): 98.6 Respiratory Rate 16 (breaths/min): Wound Assessments Treatment Notes Electronic Signature(s) Signed: 11/05/2016 7:32:57  AM By: Elliot Gurney, BSN, RN, CWS, [name], BSN Entered By: Elliot Gurney, BSN, RN, CWS, Kim on 11/04/2016 14:55:59 Harshberger, [name] ([id number]) -------------------------------------------------------------------------------- Multi-Disciplinary Care Plan Details Patient Name: KERILYN, CORTNER. Date of Service: 11/04/2016 2:30 PM Medical Record Number: [id number] Patient Account Number: [id number] Date of Birth/Sex: 27-Mar-1950 (66 y.o. Female) Treating RN: Huel Coventry Primary Care Bharat Antillon: Tonia Ghent Other Clinician: Referring Gigi Onstad: Tonia Ghent Treating Euphemia Lingerfelt/Extender: Kathreen Cosier in Treatment: 16 Active Inactive Electronic Signature(s) Signed: 11/05/2016 7:32:57 AM By: Elliot Gurney, BSN, RN, CWS, [name], BSN Entered By: Elliot Gurney, BSN, RN, CWS, Kim on 11/04/2016 17:16:52 Shukla, [name] ([id number]) -------------------------------------------------------------------------------- Pain Assessment Details Patient Name: [name], [name]. Date of Service: 11/04/2016 2:30 PM Medical Record Number: [id number] Patient Account Number: [id number] Date of Birth/Sex: [birth year] (66 y.o. Female) Treating RN: Huel Coventry Primary Care Derin Granquist: Tonia Ghent Other Clinician: Referring Lachelle Rissler: Tonia Ghent Treating Fermin Yan/Extender: Kathreen Cosier in Treatment: 16 Active Problems Location of Pain Severity and Description of Pain Patient Has Paino  No Site Locations With Dressing Change: No Pain Management and Medication Current Pain Management: Goals for Pain Management Topical or injectable lidocaine is offered to patient for acute pain when surgical debridement is performed. If needed, Patient is instructed to use over the counter pain medication for the following 24-48 hours after debridement. Wound care MDs do not prescribed pain medications. Patient has chronic pain or uncontrolled pain. Patient has been instructed to make an appointment with their Primary Care Physician for pain management. Electronic Signature(s) Signed: 11/05/2016 7:32:57 AM By: Elliot Gurney, BSN, RN, CWS, [name], BSN Entered By: Elliot Gurney, BSN, RN, CWS, Kim on 11/04/2016 14:49:54 Mangini, [name] ([id number]) -------------------------------------------------------------------------------- Patient/Caregiver Education Details Patient Name: MARESA, MORASH. Date of Service: 11/04/2016 2:30 PM Medical Record Number: [id number] Patient Account Number: [id number] Date of Birth/Gender: 1950-08-21 (66 y.o. Female) Treating RN: Huel Coventry Primary Care Physician: Tonia Ghent Other Clinician: Referring Physician: Tonia Ghent Treating Physician/Extender: Kathreen Cosier in Treatment: 16 Education Assessment Education Provided To: Patient Education Topics Provided Wound/Skin Impairment: Handouts: Caring for Your Ulcer, Other: protect toe for next two weeks Methods: Explain/Verbal Responses: State content correctly Electronic Signature(s) Signed: 11/05/2016 7:32:57 AM By: Elliot Gurney, BSN, RN, CWS, [name], BSN Entered By: Elliot Gurney, BSN, RN, CWS, Kim on 11/04/2016 15:08:06 Diamant, [name] ([id number]) -------------------------------------------------------------------------------- Wound Assessment Details Patient Name: [name], [name]. Date of Service: 11/04/2016 2:30 PM Medical Record Number: [id number] Patient Account Number: [id number] Date of Birth/Sex: 1950/09/23 (66 y.o.  Female) Treating RN: Huel Coventry Primary Care Morna Flud: Tonia Ghent Other Clinician: Referring Brissa Asante: Tonia Ghent Treating Ifeanyichukwu Wickham/Extender: Kathreen Cosier in Treatment: 16 Wound Status Wound Number: 3 Primary Etiology: Diabetic Wound/Ulcer of the Lower Extremity Wound Location: Left, Medial Toe Great Secondary Infection - not elsewhere classified Wounding Event: Gradually Appeared Etiology: Date Acquired: 06/14/2016 Wound Status: Healed - Epithelialized Weeks Of Treatment: 16 Clustered Wound: No Pending Amputation On Presentation Photos Photo Uploaded By: Elliot Gurney, BSN, RN, CWS, Kim on 11/04/2016 18:04:33 Wound Measurements Length: (cm) 0 Width: (cm) 0 Depth: (cm) 0 Area: (cm) 0 Volume: (cm) 0 % Reduction in Area: 100% % Reduction in Volume: 100% Wound Description Classification: Grade 1 Periwound Skin Texture Texture Color No Abnormalities Noted: No No Abnormalities Noted: No Moisture No Abnormalities Noted: No Electronic Signature(s) Signed: 11/05/2016 7:32:57 AM By: Elliot Gurney, BSN, RN, CWS, [name], BSN Sorce, Dustine J. ([id number]) Entered By: Elliot Gurney, BSN, RN, CWS, Kim on 11/04/2016 15:01:04 Quayle, [name] ([id number]) -------------------------------------------------------------------------------- Vitals Details Patient Name: Kathleen Ore  J. Date of Service: 11/04/2016 2:30 PM Medical Record Number: [id number] Patient Account Number: [id number] Date of Birth/Sex: 05/02/1950 (66 y.o. Female) Treating RN: Huel CoventryWoody, Kim Primary Care Chinedu Agustin: Tonia GhentBENDER, ABBY Other Clinician: Referring Glema Takaki: Tonia GhentBENDER, ABBY Treating Loraine Freid/Extender: Kathreen Cosieroulter, Leah Weeks in Treatment: 16 Vital Signs Time Taken: 14:49 Temperature (F): 98.6 Height (in): 63 Pulse (bpm): 84 Weight (lbs): 224 Respiratory Rate (breaths/min): 16 Body Mass Index (BMI): 39.7 Blood Pressure (mmHg): 124/63 Reference Range: 80 - 120 mg / dl Electronic Signature(s) Signed: 11/05/2016 7:32:57 AM By:  Elliot GurneyWoody, BSN, RN, CWS, [name], BSN Entered By: Elliot GurneyWoody, BSN, RN, CWS, Kim on 11/04/2016 14:50:16

## 2016-11-05 NOTE — Progress Notes (Signed)
[name], Lila J. ([id number]) Visit Report for 11/04/2016 Chief Complaint Document Details Patient Name: [name], Shaundra J. Date of Service: 11/04/2016 2:30 PM Medical Record Number: [id number] Patient Account Number: [id number] Date of Birth/Sex: [birth year] (66 y.o. Female) Treating RN: Ashok CordiaPinkerton, [name] Primary Care Provider: Tonia GhentBENDER, ABBY Other Clinician: Referring Provider: Tonia GhentBENDER, ABBY Treating Provider/Extender: Kathreen Cosieroulter, Neftaly Swiss Weeks in Treatment: 16 Information Obtained from: Patient Chief Complaint she is here in follow up evaluation of the left medial great toe ulcer Electronic Signature(s) Signed: 11/04/2016 5:23:40 PM By: Bonnell Publicoulter, Ryan Palermo Entered By: Bonnell Publicoulter, Paulmichael Schreck on 11/04/2016 15:04:31 Dillinger, Mardene [name] ([id number]) -------------------------------------------------------------------------------- HPI Details Patient Name: [name], Cohen J. Date of Service: 11/04/2016 2:30 PM Medical Record Number: [id number] Patient Account Number: [id number] Date of Birth/Sex: [birth year] (66 y.o. Female) Treating RN: Ashok CordiaPinkerton, [name] Primary Care Provider: Tonia GhentBENDER, ABBY Other Clinician: Referring Provider: Tonia GhentBENDER, ABBY Treating Provider/Extender: Kathreen Cosieroulter, Ridley Dileo Weeks in Treatment: 16 History of Present Illness Location: left medial hallux; LLE lateral Duration: left medial hallux-4 weeks; LLE lateral-2-1/2 weeks Timing: left medial hallux-pain with pressure/standing, ambulating, manipulation; LLE lateral-pain with manipulation Context: left medial hallux-originally was a crack in the skin; LLE lateral-traumatic injury with car door Modifying Factors: uncontrolled diabetes and smoking are exacerbating factors to wound healing HPI Description: 07/12/16- the patient arrives for initial evaluation of a left hallux and left lower extremity lateral ulcer. She states that the left hallux originally had a crack in the skin which she began to apply AandD ointment. [facility]  [facility], while going into her appointment she struck her left lateral leg on the car door. At that appointment she was given a prescription for Bactrim and completed that on 4/14 with no adverse reaction. She states that it made little to no improvement in her toe wound. She went back for a follow-up visit on 4/18 and was prescribed doxycycline for 10 days. She continues to apply AandD topical ointment along with soaking her foot in warm water. She is a diabetic, most recent A1c of 9% (I do not have records of this, this is per patient report). She is both on oral agents and insulin. She is a current smoker, approximately 0.5 ppd. She denies any remote or recent evaluation regarding arterial or venous disease. 07/19/16 Patient presents today for follow-up evaluation concerning her left great toe wound immediately and left lateral lower extremity wound. Unfortunately though both wounds appear better visually she is having a lot of discomfort. I'm unsure if this is a preformed drop of the issue or potentially the ideas or appointment is causing irritation which has caused her discomfort and pain. The good news is her wound culture came back negative and showed only normal skin flora and her x-ray of the left foot was also negative for any osseous change or osteomyelitis. Unfortunately she continues to have a lot of discomfort she would rate it a seven out of 10 right now. 07/26/2016 -- she is doing much better since the Iodoflex was stopped and she is on Santyl. Her culture report was negative and only had normal skin flora. She continues to smoke about a half pack of cigarettes a day. 08/05/2016 -- she is doing much better with her smoking and is down to about 5 cigarettes a day. The left lower extremity wound is healed and she continues to have a lot of tenderness on her left big toe. In the course of conversation we realize she was using alcohol to clean her left big toe  and we have told her to stop  this. 09/16/16 on evaluation today patient appears to be doing better in regard to her left first toe wound. With that being said she is having some discomfort at the proximal aspect which is worrisome for potential infection although there is not a significant amount of erythema noted at this point. She states this is new and she has not had pain like this up to this point. Fortunately she has no red streaks upper leg and no evidence of more extensive cellulitis. The wound is also measuring smaller. SHANDELL, GIOVANNI ([id number]) 10/04/16 on evaluation today patient appears to be doing fairly well in regard to her left first toe ulcer. She does have some Slough covering unfortunately she is not having as much discomfort she still has a little bit of discomfort at the proximal portion of the wound there is no evidence of erythema or infection. 10/11/2016 -- she says she has completely given up smoking for the last 2 days and I have commended her on this 11/04/16- she is here in follow-up evaluation. The right medial hallux ulcer is epithelialized. She states her blood sugars are under better control, this morning being 98. She states she continues to smoke although significantly decreased having one cigarette yesterday one cigarette today Electronic Signature(s) Signed: 11/04/2016 5:23:40 PM By: Bonnell Public Entered By: Bonnell Public on 11/04/2016 15:06:05 Surprenant, [name] ([id number]) -------------------------------------------------------------------------------- Physical Exam Details Patient Name: [name]. Date of Service: 11/04/2016 2:30 PM Medical Record Number: [id number] Patient Account Number: [id number] Date of Birth/Sex: [birth year] (66 y.o. Female) Treating RN: Ashok Cordia, [name] Primary Care Provider: Tonia Ghent Other Clinician: Referring Provider: Tonia Ghent Treating Provider/Extender: Kathreen Cosier in Treatment: 16 Constitutional BP within normal limits.  afebrile. Respiratory non-labored respiratory effort. Psychiatric appears to have appropriate insight and judgement. alert and oriented o4. Electronic Signature(s) Signed: 11/04/2016 5:23:40 PM By: Bonnell Public Entered By: Bonnell Public on 11/04/2016 15:06:47 Porzio, [name] ([id number]) -------------------------------------------------------------------------------- Physician Orders Details Patient Name: [name]. Date of Service: 11/04/2016 2:30 PM Medical Record Number: [id number] Patient Account Number: [id number] Date of Birth/Sex: July 15, 1950 (66 y.o. Female) Treating RN: Huel Coventry Primary Care Provider: Tonia Ghent Other Clinician: Referring Provider: Tonia Ghent Treating Provider/Extender: Kathreen Cosier in Treatment: 16 Verbal / Phone Orders: No Diagnosis Coding Wound Cleansing o Clean wound with Normal Saline. Secondary Dressing o Other - Coverlet for protection Follow-up Appointments o Other: - if needed Additional Orders / Instructions o Stop Smoking Discharge From [facility] Services o Discharge from Wound Care Center Electronic Signature(s) Signed: 11/04/2016 5:23:40 PM By: Bonnell Public Signed: 11/05/2016 7:32:57 AM By: Elliot Gurney, BSN, RN, CWS, [name], BSN Entered By: Elliot Gurney, BSN, RN, CWS, Kim on 11/04/2016 15:03:11 Luff, [name] ([id number]) -------------------------------------------------------------------------------- Problem List Details Patient Name: LURETHA, [name]. Date of Service: 11/04/2016 2:30 PM Medical Record Number: [id number] Patient Account Number: [id number] Date of Birth/Sex: 11-Jan-1951 (66 y.o. Female) Treating RN: Ashok Cordia, [name] Primary Care Provider: Tonia Ghent Other Clinician: Referring Provider: Tonia Ghent Treating Provider/Extender: Kathreen Cosier in Treatment: 16 Active Problems ICD-10 Encounter Code Description Active Date Diagnosis 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 2 diabetes mellitus with foot ulcer 07/12/2016 Yes E11.622 Type  2 diabetes mellitus with other skin ulcer 07/12/2016 Yes F17.218 Nicotine dependence, cigarettes, with other nicotine- 07/12/2016 Yes induced disorders L97.522 Non-pressure chronic ulcer of other part of left foot with fat 08/30/2016 Yes layer exposed Inactive Problems Resolved Problems ICD-10 Code Description Active Date Resolved Date L97.511 Non-pressure chronic ulcer of  other part of right foot 07/12/2016 07/12/2016 limited to breakdown of skin L97.212 Non-pressure chronic ulcer of right calf with fat layer 07/12/2016 07/12/2016 exposed Electronic Signature(s) Signed: 11/04/2016 5:23:40 PM By: Bonnell Public Entered By: Bonnell Public on 11/04/2016 15:03:58 Engebretson, Tiffiney J. ([id number]) Ditmars, Jode J. ([id number]) -------------------------------------------------------------------------------- Progress Note Details Patient Name: [name]. Date of Service: 11/04/2016 2:30 PM Medical Record Number: [id number] Patient Account Number: [id number] Date of Birth/Sex: [birth year] (66 y.o. Female) Treating RN: Ashok Cordia, [name] Primary Care Provider: Tonia Ghent Other Clinician: Referring Provider: Tonia Ghent Treating Provider/Extender: Kathreen Cosier in Treatment: 16 Subjective Chief Complaint Information obtained from Patient she is here in follow up evaluation of the left medial great toe ulcer History of Present Illness (HPI) The following HPI elements were documented for the patient's wound: Location: left medial hallux; LLE lateral Duration: left medial hallux-4 weeks; LLE lateral-2-1/2 weeks Timing: left medial hallux-pain with pressure/standing, ambulating, manipulation; LLE lateral-pain with manipulation Context: left medial hallux-originally was a crack in the skin; LLE lateral-traumatic injury with car door Modifying Factors: uncontrolled diabetes and smoking are exacerbating factors to wound healing 07/12/16- the patient arrives for initial evaluation of a left hallux and  left lower extremity lateral ulcer. She states that the left hallux originally had a crack in the skin which she began to apply AandD ointment. She went to her PCP at [facility] on 4/4, while going into her appointment she struck her left lateral leg on the car door. At that appointment she was given a prescription for Bactrim and completed that on 4/14 with no adverse reaction. She states that it made little to no improvement in her toe wound. She went back for a follow-up visit on 4/18 and was prescribed doxycycline for 10 days. She continues to apply AandD topical ointment along with soaking her foot in warm water. She is a diabetic, most recent A1c of 9% (I do not have records of this, this is per patient report). She is both on oral agents and insulin. She is a current smoker, approximately 0.5 ppd. She denies any remote or recent evaluation regarding arterial or venous disease. 07/19/16 Patient presents today for follow-up evaluation concerning her left great toe wound immediately and left lateral lower extremity wound. Unfortunately though both wounds appear better visually she is having a lot of discomfort. I'm unsure if this is a preformed drop of the issue or potentially the ideas or appointment is causing irritation which has caused her discomfort and pain. The good news is her wound culture came back negative and showed only normal skin flora and her x-ray of the left foot was also negative for any osseous change or osteomyelitis. Unfortunately she continues to have a lot of discomfort she would rate it a seven out of 10 right now. 07/26/2016 -- she is doing much better since the Iodoflex was stopped and she is on Santyl. Her culture report was negative and only had normal skin flora. She continues to smoke about a half pack of cigarettes a day. 08/05/2016 -- she is doing much better with her smoking and is down to about 5 cigarettes a day. The left lower extremity  wound is healed and she continues to have a lot of tenderness on her left big toe. In the MIRACLE, MONGILLO ([id number]) course of conversation we realize she was using alcohol to clean her left big toe and we have told her to stop this. 09/16/16 on evaluation today patient appears to be  doing better in regard to her left first toe wound. With that being said she is having some discomfort at the proximal aspect which is worrisome for potential infection although there is not a significant amount of erythema noted at this point. She states this is new and she has not had pain like this up to this point. Fortunately she has no red streaks upper leg and no evidence of more extensive cellulitis. The wound is also measuring smaller. 10/04/16 on evaluation today patient appears to be doing fairly well in regard to her left first toe ulcer. She does have some Slough covering unfortunately she is not having as much discomfort she still has a little bit of discomfort at the proximal portion of the wound there is no evidence of erythema or infection. 10/11/2016 -- she says she has completely given up smoking for the last 2 days and I have commended her on this 11/04/16- she is here in follow-up evaluation. The right medial hallux ulcer is epithelialized. She states her blood sugars are under better control, this morning being 98. She states she continues to smoke although significantly decreased having one cigarette yesterday one cigarette today Objective Constitutional BP within normal limits. afebrile. Vitals Time Taken: 2:49 PM, Height: 63 in, Weight: 224 lbs, BMI: 39.7, Temperature: 98.6 F, Pulse: 84 bpm, Respiratory Rate: 16 breaths/min, Blood Pressure: 124/63 mmHg. Respiratory non-labored respiratory effort. Psychiatric appears to have appropriate insight and judgement. alert and oriented o4. Integumentary (Hair, Skin) Wound #3 status is Healed - Epithelialized. Original cause of wound was  Gradually Appeared. The wound is located on the [employer]. The wound measures 0cm length x 0cm width x 0cm depth; 0cm^2 area and 0cm^3 volume. ROSILYN, COACHMAN ([id number]) Assessment Active Problems ICD-10 E11.621 - Type 2 diabetes mellitus with foot ulcer E11.622 - Type 2 diabetes mellitus with other skin ulcer F17.218 - Nicotine dependence, cigarettes, with other nicotine-induced disorders L97.522 - Non-pressure chronic ulcer of other part of left foot with fat layer exposed left medial hallux completely epithelialized She was encouraged to continue to protect this area, as if the wound was still present, for the next 2 weeks She was encouraged to slowly integrate back to full-time foot where after the two-week period She was encouraged to contact wound care center with any changes/drainage from the great toe Plan Wound Cleansing: Clean wound with Normal Saline. Secondary Dressing: Other - Coverlet for protection Follow-up Appointments: Other: - if needed Additional Orders / Instructions: Stop Smoking Discharge From [facility] Services: Discharge from Wound Care Center 1. Discharge from wound care center Electronic Signature(s) DYANN, GOODSPEED ([id number]) Signed: 11/04/2016 5:23:40 PM By: Bonnell Public Signed: 11/05/2016 7:32:57 AM By: Elliot Gurney, BSN, RN, CWS, [name], BSN Entered By: Elliot Gurney, BSN, RN, CWS, Kim on 11/04/2016 17:17:52 Leider, [name] ([id number]) -------------------------------------------------------------------------------- SuperBill Details Patient Name: [name], [name]. Date of Service: 11/04/2016 Medical Record Number: [id number] Patient Account Number: [id number] Date of Birth/Sex: 02/05/1951 (66 y.o. Female) Treating RN: Ashok Cordia, [name] Primary Care Provider: Tonia Ghent Other Clinician: Referring Provider: Tonia Ghent Treating Provider/Extender: Kathreen Cosier in Treatment: 16 Diagnosis Coding ICD-10 Codes Code Description 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 2 diabetes  mellitus with foot ulcer E11.622 Type 2 diabetes mellitus with other skin ulcer F17.218 Nicotine dependence, cigarettes, with other nicotine-induced disorders L97.522 Non-pressure chronic ulcer of other part of left foot with fat layer exposed Facility Procedures CPT4 Code: [id number] Description: 64332 - WOUND CARE VISIT-LEV 2 EST PT Modifier: Quantity: 1 Physician Procedures CPT4 Code Description:  [id number] - WC PHYS LEVEL 3 - EST PT ICD-10 Description Diagnosis 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 2 diabetes mellitus with foot ulcer L97.522 Non-pressure chronic ulcer of other part of left foo Modifier: t with fat laye Quantity: 1 r exposed Electronic Signature(s) Signed: 11/04/2016 5:23:40 PM By: Bonnell Public Signed: 11/05/2016 7:32:57 AM By: Elliot Gurney, BSN, RN, CWS, [name], BSN Entered By: Elliot Gurney, BSN, RN, CWS, Kim on 11/04/2016 17:17:35

## 2017-05-14 ENCOUNTER — Other Ambulatory Visit: Payer: Self-pay | Admitting: Family Medicine

## 2017-05-14 DIAGNOSIS — Z1231 Encounter for screening mammogram for malignant neoplasm of breast: Secondary | ICD-10-CM

## 2018-01-01 ENCOUNTER — Encounter: Payer: Self-pay | Admitting: Neurology

## 2018-03-23 ENCOUNTER — Ambulatory Visit: Payer: Medicare HMO | Admitting: Neurology

## 2021-05-11 ENCOUNTER — Emergency Department: Payer: Medicare HMO

## 2021-05-11 ENCOUNTER — Emergency Department
Admission: EM | Admit: 2021-05-11 | Discharge: 2021-05-11 | Disposition: A | Discharge status: Home / Self Care | Payer: Medicare HMO | Attending: Emergency Medicine | Admitting: Emergency Medicine

## 2021-05-11 ENCOUNTER — Other Ambulatory Visit: Payer: Self-pay

## 2021-05-11 DIAGNOSIS — I10 Essential (primary) hypertension: Secondary | ICD-10-CM | POA: Diagnosis not present

## 2021-05-11 DIAGNOSIS — I6523 Occlusion and stenosis of bilateral carotid arteries: Secondary | ICD-10-CM | POA: Diagnosis not present

## 2021-05-11 DIAGNOSIS — E1142 Type 2 diabetes mellitus with diabetic polyneuropathy: Secondary | ICD-10-CM

## 2021-05-11 DIAGNOSIS — J449 Chronic obstructive pulmonary disease, unspecified: Secondary | ICD-10-CM | POA: Insufficient documentation

## 2021-05-11 DIAGNOSIS — I671 Cerebral aneurysm, nonruptured: Secondary | ICD-10-CM

## 2021-05-11 DIAGNOSIS — R4781 Slurred speech: Secondary | ICD-10-CM | POA: Diagnosis present

## 2021-05-11 DIAGNOSIS — Z20822 Contact with and (suspected) exposure to covid-19: Secondary | ICD-10-CM | POA: Insufficient documentation

## 2021-05-11 DIAGNOSIS — E11649 Type 2 diabetes mellitus with hypoglycemia without coma: Secondary | ICD-10-CM | POA: Diagnosis not present

## 2021-05-11 DIAGNOSIS — E162 Hypoglycemia, unspecified: Secondary | ICD-10-CM

## 2021-05-11 DIAGNOSIS — R4182 Altered mental status, unspecified: Secondary | ICD-10-CM | POA: Insufficient documentation

## 2021-05-11 DIAGNOSIS — Z794 Long term (current) use of insulin: Secondary | ICD-10-CM

## 2021-05-11 LAB — RESP PANEL BY RT-PCR (FLU A&B, COVID) ARPGX2
Influenza A by PCR: NEGATIVE
Influenza B by PCR: NEGATIVE
SARS Coronavirus 2 by RT PCR: NEGATIVE

## 2021-05-11 LAB — CBC
HCT: 42.4 % (ref 36.0–46.0)
Hemoglobin: 12.4 g/dL (ref 12.0–15.0)
MCH: 25.9 pg — ABNORMAL LOW (ref 26.0–34.0)
MCHC: 29.2 g/dL — ABNORMAL LOW (ref 30.0–36.0)
MCV: 88.7 fL (ref 80.0–100.0)
Platelets: 183 10*3/uL (ref 150–400)
RBC: 4.78 MIL/uL (ref 3.87–5.11)
RDW: 16.3 % — ABNORMAL HIGH (ref 11.5–15.5)
WBC: 9.7 10*3/uL (ref 4.0–10.5)
nRBC: 0 % (ref 0.0–0.2)

## 2021-05-11 LAB — URINALYSIS, ROUTINE W REFLEX MICROSCOPIC
Bilirubin Urine: NEGATIVE
Glucose, UA: NEGATIVE mg/dL
Hgb urine dipstick: NEGATIVE
Ketones, ur: NEGATIVE mg/dL
Leukocytes,Ua: NEGATIVE
Nitrite: NEGATIVE
Protein, ur: NEGATIVE mg/dL
Specific Gravity, Urine: 1.031 — ABNORMAL HIGH (ref 1.005–1.030)
pH: 5 (ref 5.0–8.0)

## 2021-05-11 LAB — DIFFERENTIAL
Abs Immature Granulocytes: 0.04 10*3/uL (ref 0.00–0.07)
Basophils Absolute: 0 10*3/uL (ref 0.0–0.1)
Basophils Relative: 0 %
Eosinophils Absolute: 0.1 10*3/uL (ref 0.0–0.5)
Eosinophils Relative: 1 %
Immature Granulocytes: 0 %
Lymphocytes Relative: 13 %
Lymphs Abs: 1.3 10*3/uL (ref 0.7–4.0)
Monocytes Absolute: 0.5 10*3/uL (ref 0.1–1.0)
Monocytes Relative: 5 %
Neutro Abs: 7.7 10*3/uL (ref 1.7–7.7)
Neutrophils Relative %: 81 %

## 2021-05-11 LAB — COMPREHENSIVE METABOLIC PANEL
ALT: 15 U/L (ref 0–44)
AST: 21 U/L (ref 15–41)
Albumin: 3.2 g/dL — ABNORMAL LOW (ref 3.5–5.0)
Alkaline Phosphatase: 105 U/L (ref 38–126)
Anion gap: 7 (ref 5–15)
BUN: 17 mg/dL (ref 8–23)
CO2: 21 mmol/L — ABNORMAL LOW (ref 22–32)
Calcium: 8.3 mg/dL — ABNORMAL LOW (ref 8.9–10.3)
Chloride: 111 mmol/L (ref 98–111)
Creatinine, Ser: 1.32 mg/dL — ABNORMAL HIGH (ref 0.44–1.00)
GFR, Estimated: 44 mL/min — ABNORMAL LOW (ref >60)
Glucose, Bld: 83 mg/dL (ref 70–99)
Potassium: 4.8 mmol/L (ref 3.5–5.1)
Sodium: 139 mmol/L (ref 135–145)
Total Bilirubin: 1 mg/dL (ref 0.3–1.2)
Total Protein: 6.5 g/dL (ref 6.5–8.1)

## 2021-05-11 LAB — APTT: aPTT: 28 seconds (ref 24–36)

## 2021-05-11 LAB — TROPONIN I (HIGH SENSITIVITY)
Troponin I (High Sensitivity): 9 ng/L (ref <18)
Troponin I (High Sensitivity): 7 ng/L (ref <18)

## 2021-05-11 LAB — ETHANOL: Alcohol, Ethyl (B): <10 mg/dL (ref <10)

## 2021-05-11 LAB — CBG MONITORING, ED
Glucose-Capillary: 75 mg/dL (ref 70–99)
Glucose-Capillary: 129 mg/dL — ABNORMAL HIGH (ref 70–99)

## 2021-05-11 LAB — PROTIME-INR
INR: 1 (ref 0.8–1.2)
Prothrombin Time: 12.7 seconds (ref 11.4–15.2)

## 2021-05-11 MED ORDER — ASPIRIN EC 325 MG PO TBEC: 325.0000 mg | DELAYED_RELEASE_TABLET | Freq: Every day | ORAL | 2 refills | Status: DC

## 2021-05-11 MED ORDER — ASPIRIN 81 MG PO CHEW
324.0000 mg | CHEWABLE_TABLET | Freq: Once | ORAL | Status: AC
Start: 2021-05-11 — End: 2021-05-11
  Administered 2021-05-11: 324 mg via ORAL
  Filled 2021-05-11: qty 4

## 2021-05-11 MED ORDER — IOHEXOL 350 MG/ML SOLN
75.0000 mL | Freq: Once | INTRAVENOUS | Status: AC | PRN
Start: 2021-05-11 — End: 2021-05-11
  Administered 2021-05-11: 75 mL via INTRAVENOUS

## 2021-05-11 MED ORDER — SODIUM CHLORIDE 0.9% FLUSH: 3.0000 mL | Freq: Once | INTRAVENOUS | Status: DC

## 2021-05-11 NOTE — Consult Note (Signed)
Neurology Stroke Consult H&P  [name] MR# [id number] 05/11/2021   CC: woke with slurred speech  History is obtained from: EMS, patient and chart.  HPI: [name] is a 71 y.o. female PMHx as reviewed below went to be normal last night at 2130 and woke feeling generalized weakness and slid down to the floor. She activated her medical alert button and husband found her on the floor. EMS found CBG 62 which was very low for her and since she had slurred speech and slightly confused with difficulty identifying a couple of objects she came in as code stroke.  EMS gave D50x2 and a biscuit and she improved to baseline en route.   LKW: 05/10/2021 at 2130 tNK given: No OSW and less likely TIA IR Thrombectomy No, not indicated Modified Rankin Scale: 1-No significant post stroke disability and can perform usual duties with stroke symptoms NIHSS: 0  ROS: A complete ROS was performed and is negative except as noted in the HPI.   Phx DM2 Peripheral neuropathy  Social History:  has no history on file for tobacco use, alcohol use, and drug use.   Prior to Admission medications   Medication Sig Start Date End Date Taking? Authorizing Provider  buPROPion (WELLBUTRIN SR) 150 MG 12 hr tablet Take 1 tablet by mouth daily. 05/07/21   [provider]  buPROPion (ZYBAN) 150 MG 12 hr tablet Take 150 mg by mouth 2 (two) times daily. 05/07/21   [provider]  busPIRone (BUSPAR) 5 MG tablet Take 5 mg by mouth 2 (two) times daily. 05/07/21   [provider]  LYRICA 100 MG capsule Take 100 mg by mouth 2 (two) times daily. 05/10/21   [provider]  meloxicam (MOBIC) 15 MG tablet Take 15 mg by mouth daily. 05/07/21   [provider]  NOVOLIN 70/30 (70-30) 100 UNIT/ML injection Inject into the skin. 05/07/21   [provider]  TRULICITY 0.75 MG/0.5ML SOPN Inject into the skin. 05/10/21   [provider]    Exam: Current vital  signs: There were no vitals taken for this visit.  Physical Exam  Constitutional: Appears well-developed and well-nourished.  Psych: Affect appropriate to situation Eyes: No scleral injection HENT: No OP obstruction. Head: Normocephalic.  Cardiovascular: Normal rate and regular rhythm.  Respiratory: Effort normal, symmetric excursions bilaterally, no audible wheezing. GI: Soft.  No distension. There is no tenderness.  Skin: WDI  Neuro: Mental Status: Patient is awake, alert, oriented to person, place, month, year, and situation. Patient is able to give a clear and coherent history. Speech fluent, intact comprehension and repetition. No signs of aphasia or neglect. Visual Fields are full. Pupils are equal, round, and reactive to light. EOMI without ptosis or diplopia.  Facial sensation is symmetric to temperature Facial movement is symmetric.  Hearing is intact to voice. Uvula midline and palate elevates symmetrically. Shoulder shrug is symmetric. Tongue is midline without atrophy or fasciculations.  Tone is normal. Bulk is normal. 5/5 strength was present in all four extremities. Sensation is symmetric to light touch and temperature in the arms and legs. Subjective numbness in lower extremities up to the knees and in fingertips of both hands. Deep Tendon Reflexes: 2+ and symmetric in the biceps and patellae. Toes are downgoing bilaterally. FNF and HKS are intact bilaterally. Gait - Deferred  I have reviewed labs in epic and the pertinent results are: CBG 75  I have reviewed the images obtained: NCT head showed did not show  acute ischemic changes, hemorrhage or mass. CTA head and neck showed no intracranial large vessel occlusion. Apparent severe atherosclerotic stenosis of the distal cavernous/paraclinoid right ICA. Atherosclerotic plaque within the intracranial left ICA with no more than mild stenosis. There is a 5 x 3 mm saccular aneurysm arising from right MCA bifurcation  and 4 x 3 mm saccular aneurysm arising from the left MCA bifurcation.   Assessment: [name] is a 71 y.o. female PMHx DM2 peripheral neuropathy with transient encephalopathy in setting of hypoglycemia. She provided that her blood sugars average 160s and EMS CBG 62  which is a significant departure from her baseline. She returned to her baseline after D50x2 and now is sitting up comfortably eating peanut butter and biscuits. He son was at bedside and stated that this has happened in the past. She is back to baseline and her exam does not show any focal findings which is comforting. We discussed closer blood glucose monitoring and to call her PCP to discuss better blood glucose control.  CTA incidentally showed intracranial stenoses and advised her to increase her daily dose of aspirin to 325mg . Also showed bilateral bilateral ICA saccular aneurysms. Discussed the case with Dr. Pedro Earls and her office will call patient to schedule follow up for diagnostic angiogram with intention to treat.  We also discussed smoking smoking is a well-documented risk factor for the formation, growth, multiplicity, and rupture of intracranial aneurysms as well as accelerating atherosclerosis and she will try to quit.   Recommended aspirin 324mg  now.  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 Karenann Cai . - Continue aspirin 325mg  daily. - BP goal <130/90 - Home SBPs <120s and controlled. - Discuss better blood sugar control with PCP. - Quit smoking. - Neurology will remain available, please call for questions.  This patient is critically ill and at significant risk of neurological worsening, death and care requires  constant monitoring of vital signs, hemodynamics,respiratory and cardiac monitoring, neurological assessment, discussion with family, other specialists and medical decision making of high complexity. I spent 75 minutes of neurocritical care time  in the care of  this patient. This was time spent independent of any time provided by nurse practitioner or PA.  Electronically signed by:  Lynnae Sandhoff, MD Page: [id number] 05/11/2021, 11:01 AM  If 7pm- 7am, please page neurology on call as listed in [place].

## 2021-05-11 NOTE — Progress Notes (Signed)
°   05/11/21 1105  Clinical Encounter Type  Visited With Health care provider;Family  Visit Type Initial;Social support;Code  Spiritual Encounters  Spiritual Needs Other (Comment) (social support for spouse)   Chaplain Burris checked-in with nursing staff; Pt still receiving evaluation. Chaplain engaged front desk and located Pt's spouse in lobby. Checked-in on spouse's well-being; offered normalization of emotions and informed spouse of pastoral care presence for whatever support might be meaningful. Spouse shared that he is fatigued, worked overnight and also that he has family dog in car so may step out to care for their pet. Chaplain B informed him that I would return and help to facilitate communication as to when he come back to the room.  Chaplain will remain on unit and continuing to offer support.

## 2021-05-11 NOTE — ED Notes (Signed)
Pt ambulated with assistance to the restroom.

## 2021-05-11 NOTE — ED Notes (Signed)
Code Stroke cancelled by neurologist. 

## 2021-05-11 NOTE — Progress Notes (Signed)
°   05/11/21 1140  Clinical Encounter Type  Visited With Patient and family together  Visit Type Follow-up;Social support;Spiritual support   Luna Fuse attended to Pt to assess any needs. She expressed feeling cold and Chaplain B retrieved warm blankets after receiving okay from physician that it would not disturb monitoring. Chaplain Burris offered Pt a calm, non-anxious presence and informed her of my onogoing availability for support; offered silent prayer.  Chaplain B also checked-in with nurse and upon approval notified spouse that he could come back to the room; chaplain escorted him back.

## 2021-05-11 NOTE — Code Documentation (Signed)
Stroke Response Nurse Documentation Code Documentation  [name] is a 71 y.o. female arriving to [facility] ED via [facility] on 2/17. Code stroke was activated by EMS.   Patient from home where she was LKW at 05/10/2021 at 2130. Pt woke up feeling weak, dizzy, with headache. Pt pressed her life alert for EMS. When EMS arrived pts CBG was 64 so she was given oral glucose.  CBG was 75 on arrival to ED  Stroke team at the bedside on patient arrival. Labs drawn and patient cleared for CT by Dr. Cherylann Banas. Patient to CT with team. NIHSS 0, see documentation for details and code stroke times. CT, CTA head and neck done. Patient is not a candidate for IV Thrombolytic due to LKW>4.5.   Bedside handoff with ED RN.    Velta Addison Stroke Designer, fashion/clothing

## 2021-05-11 NOTE — ED Triage Notes (Signed)
Arrives from home.  Neurology, EDP, and stroke team met patient at EMS door.  Per EMS report, patient had activated her medical alert button and husband found patient on floor.  Initial CBG:  62, last check CBG 91.  EMS reports slurred speech and difficulty identifying objects. To CT scan

## 2021-05-11 NOTE — ED Notes (Signed)
Pt provided discharge instructions and prescription information. Pt was given the opportunity to ask questions and questions were answered. Discharge signature not obtained in the setting of the COVID-19 pandemic in order to reduce high touch surfaces.  ° °

## 2021-05-11 NOTE — ED Provider Notes (Signed)
[facility] Provider Note    Event Date/Time   First MD Initiated Contact with Patient 05/11/21 1046     (approximate)   History   Code Stroke   HPI  [name] is a 71 y.o. female with a history of COPD, DM, hypertension, osteoarthritis, and gout who presents with speech disturbance and altered mental status and gait disturbance and concern for possible stroke.  Per EMS, the patient pressed her life alert this morning and her husband came to her and found her to be weak and somewhat confused.  When EMS got there she was alert and speaking clearly to them.  Glucose was 64.  Subsequently when they tried to move the patient, she had difficulty with gait and demonstrated slurred speech.  This was within the last 30 minutes.  During transport the speech disturbance resolved and the patient returned to her baseline.  EMS did a stroke screen and also found that the patient had difficulty identifying objects.  The patient herself reports a headache but denies other acute complaints.  She states that she did feel weak and somewhat shaky earlier but denies dizziness.  She has no focal weakness.   Physical Exam   Triage Vital Signs: ED Triage Vitals  Enc Vitals Group     BP      Pulse      Resp      Temp      Temp src      SpO2      Weight      Height      Head Circumference      Peak Flow      Pain Score      Pain Loc      Pain Edu?      Excl. in GC?     Most recent vital signs: Vitals:   05/11/21 1300 05/11/21 1330  BP: (!) 133/54 (!) 106/55  Pulse: 69 74  Resp: 15 14  Temp:    SpO2: 100% 100%     General: Alert, oriented x4, no distress. CV:  Good peripheral perfusion.  Resp:  Normal effort.  Faint expiratory wheezing. Abd:  No distention.  Other:  Cranial nerves III through XII grossly intact.  No facial droop.  5/5 motor strength and intact sensation to all 4 extremities.  No pronator drift.  No ataxia.  Clear speech.  Dry mucous  membranes.   ED Results / Procedures / Treatments   Labs (all labs ordered are listed, but only abnormal results are displayed) Labs Reviewed  CBC - Abnormal; Notable for the following components:      Result Value   MCH 25.9 (*)    MCHC 29.2 (*)    RDW 16.3 (*)    All other components within normal limits  URINALYSIS, ROUTINE W REFLEX MICROSCOPIC - Abnormal; Notable for the following components:   Color, Urine STRAW (*)    APPearance CLEAR (*)    Specific Gravity, Urine 1.031 (*)    All other components within normal limits  COMPREHENSIVE METABOLIC PANEL - Abnormal; Notable for the following components:   CO2 21 (*)    Creatinine, Ser 1.32 (*)    Calcium 8.3 (*)    Albumin 3.2 (*)    GFR, Estimated 44 (*)    All other components within normal limits  CBG MONITORING, ED - Abnormal; Notable for the following components:   Glucose-Capillary 129 (*)    All other components within normal  limits  RESP PANEL BY RT-PCR (FLU A&B, COVID) ARPGX2  DIFFERENTIAL  ETHANOL  PROTIME-INR  APTT  CBC  DIFFERENTIAL  CBG MONITORING, ED  TROPONIN I (HIGH SENSITIVITY)  TROPONIN I (HIGH SENSITIVITY)     EKG  ED ECG REPORT I, Dionne Bucy, the attending physician, personally viewed and interpreted this ECG.  Date: 05/11/2021 EKG Time: 1138 Rate: 71 Rhythm: normal sinus rhythm QRS Axis: normal Intervals: normal ST/T Wave abnormalities: normal Narrative Interpretation: no evidence of acute ischemia    RADIOLOGY  I independently viewed and interpreted the CT head code stroke; there is no evidence of ICH or large acute ischemic stroke.  PROCEDURES:  Critical Care performed: Yes, see critical care procedure note(s)  .Critical Care Performed by: Dionne Bucy, MD Authorized by: Dionne Bucy, MD   Critical care provider statement:    Critical care time (minutes):  30   Critical care was necessary to treat or prevent imminent or life-threatening deterioration  of the following conditions:  CNS failure or compromise   Critical care was time spent personally by me on the following activities:  Development of treatment plan with patient or surrogate, discussions with consultants, evaluation of patient's response to treatment, examination of patient, ordering and review of laboratory studies, ordering and review of radiographic studies, ordering and performing treatments and interventions, pulse oximetry, re-evaluation of patient's condition and review of old charts    MEDICATIONS ORDERED IN ED: Medications  sodium chloride flush (NS) 0.9 % injection 3 mL (3 mLs Intravenous Not Given 05/11/21 1146)  aspirin chewable tablet 324 mg (has no administration in time range)  iohexol (OMNIPAQUE) 350 MG/ML injection 75 mL (75 mLs Intravenous Contrast Given 05/11/21 1101)     IMPRESSION / MDM / ASSESSMENT AND PLAN / ED COURSE  I reviewed the triage vital signs and the nursing notes.  71 year old female with PMH as noted above including diabetes, hypertension, osteoarthritis, and gout presents by EMS with speech and gait disturbance and concern for possible acute stroke.  The slurred speech resolved during transport.  She was hypoglycemic in the field.  On exam, the vital signs are normal.  Neurologic exam is currently normal.  The patient is alert and oriented x4.  Differential diagnosis includes, but is not limited to, CVA/TIA, intracranial hemorrhage, hypoglycemia, near syncope, dehydration, electrolyte disturbance, UTI or other infection, or less likely cardiac cause.  Code stroke was activated in the field.  On arrival to the ED, the patient was immediately evaluated by me and the neurologist Dr. Thomasena Edis and then sent for CT.  We will obtain lab work-up for further assessment.  The patient is on the cardiac monitor to evaluate for evidence of arrhythmia and/or significant heart rate changes.  ----------------------------------------- 11:14 AM on  05/11/2021 -----------------------------------------  The patient has returned from CT.  I reviewed the images and there is no evidence of ICH or acute ischemic stroke.  I consulted with Dr. Thomasena Edis from neurology who has evaluated the patient.  Overall suspicion for acute stroke is low.  Lab work-up is pending.  I am awaiting further recommendations.  ----------------------------------------- 2:37 PM on 05/11/2021 -----------------------------------------  I consulted again with Dr. Thomasena Edis from neurology.  He has cleared the patient from a neurology perspective and canceled the code stroke.  He does not recommend admission or further stroke work-up.  Overall presentation is most consistent with hypoglycemia.  However, the CT angio does show 2 aneurysms.  These do not require any emergent intervention, however Dr. Thomasena Edis consulted with  Dr. Salvadore Dom from neuro interventional radiology to arrange for prompt follow-up.  Her office will contact the patient.  On reassessment the patient is alert and oriented, ambulating without difficulty, and is at her baseline.  She is stable for discharge home.  I counseled her on the results of the work-up.  We have prescribed 325 mg of aspirin per neurology recommendations.  I discussed the CT results with her and the plan for follow-up with radiology.  Return precautions given, and she expresses understanding.      FINAL CLINICAL IMPRESSION(S) / ED DIAGNOSES   Final diagnoses:  Slurred speech  Hypoglycemia  Cerebral aneurysm     Rx / DC Orders   ED Discharge Orders          Ordered    aspirin EC 325 MG tablet  Daily        05/11/21 1435             Note:  This document was prepared using Dragon voice recognition software and may include unintentional dictation errors.    Dionne Bucy, MD 05/11/21 1441

## 2021-05-11 NOTE — ED Notes (Signed)
LKW 05/10/2021 at 2130

## 2021-05-11 NOTE — Discharge Instructions (Addendum)
Your episode of slurred speech today was likely due to a low blood sugar.  This has now resolved.  Make sure you are taking your normal medications as prescribed.  There is no sign of a stroke.  However, you do have 2 aneurysms or small swollen blood vessels in your brain.  These will need follow-up.  You should follow-up with Dr. Ladean Raya; her office will follow up with you to arrange an appointment.  If you do not hear from her office within the next several days you can call the number provided.  Start taking 325 mg of aspirin daily.  A prescription has been provided.  Return to the ER immediately for new, worsening, or recurrent weakness, lightheadedness, numbness, difficulty speaking, changes in your vision, difficulty walking, severe headache, or any other new or worsening symptoms that concern you.

## 2021-05-11 NOTE — ED Notes (Signed)
Pt sitting up in chair, texting on her phone. NAD.

## 2021-05-14 ENCOUNTER — Telehealth (HOSPITAL_COMMUNITY): Payer: Self-pay

## 2021-05-14 ENCOUNTER — Other Ambulatory Visit (HOSPITAL_COMMUNITY): Payer: Self-pay | Admitting: Neuroradiology

## 2021-05-14 DIAGNOSIS — I671 Cerebral aneurysm, nonruptured: Secondary | ICD-10-CM

## 2021-05-14 NOTE — Telephone Encounter (Signed)
Called to schedule consult with Dr. Rodrigues, no answer, left vm. AW  

## 2021-05-26 ENCOUNTER — Emergency Department (HOSPITAL_COMMUNITY): Payer: Medicare HMO

## 2021-05-26 ENCOUNTER — Other Ambulatory Visit: Payer: Self-pay

## 2021-05-26 ENCOUNTER — Observation Stay (HOSPITAL_COMMUNITY)
Admission: EM | Admit: 2021-05-26 | Discharge: 2021-05-27 | Disposition: A | Discharge status: Home / Self Care | Payer: Medicare HMO | Attending: Internal Medicine | Admitting: Internal Medicine

## 2021-05-26 ENCOUNTER — Encounter (HOSPITAL_COMMUNITY): Payer: Self-pay | Admitting: *Deleted

## 2021-05-26 DIAGNOSIS — S060XAA Concussion with loss of consciousness status unknown, initial encounter: Secondary | ICD-10-CM | POA: Insufficient documentation

## 2021-05-26 DIAGNOSIS — S73101A Unspecified sprain of right hip, initial encounter: Secondary | ICD-10-CM | POA: Diagnosis not present

## 2021-05-26 DIAGNOSIS — Z20822 Contact with and (suspected) exposure to covid-19: Secondary | ICD-10-CM | POA: Diagnosis not present

## 2021-05-26 DIAGNOSIS — E11649 Type 2 diabetes mellitus with hypoglycemia without coma: Secondary | ICD-10-CM | POA: Diagnosis not present

## 2021-05-26 DIAGNOSIS — W1830XA Fall on same level, unspecified, initial encounter: Secondary | ICD-10-CM | POA: Insufficient documentation

## 2021-05-26 DIAGNOSIS — R4182 Altered mental status, unspecified: Secondary | ICD-10-CM | POA: Diagnosis present

## 2021-05-26 DIAGNOSIS — R55 Syncope and collapse: Secondary | ICD-10-CM | POA: Diagnosis not present

## 2021-05-26 DIAGNOSIS — F17210 Nicotine dependence, cigarettes, uncomplicated: Secondary | ICD-10-CM | POA: Diagnosis not present

## 2021-05-26 DIAGNOSIS — S161XXA Strain of muscle, fascia and tendon at neck level, initial encounter: Secondary | ICD-10-CM | POA: Diagnosis not present

## 2021-05-26 DIAGNOSIS — E162 Hypoglycemia, unspecified: Secondary | ICD-10-CM | POA: Diagnosis not present

## 2021-05-26 DIAGNOSIS — Z794 Long term (current) use of insulin: Secondary | ICD-10-CM | POA: Diagnosis not present

## 2021-05-26 DIAGNOSIS — S43401A Unspecified sprain of right shoulder joint, initial encounter: Secondary | ICD-10-CM | POA: Diagnosis not present

## 2021-05-26 DIAGNOSIS — S76011A Strain of muscle, fascia and tendon of right hip, initial encounter: Secondary | ICD-10-CM

## 2021-05-26 DIAGNOSIS — Z79899 Other long term (current) drug therapy: Secondary | ICD-10-CM | POA: Diagnosis not present

## 2021-05-26 DIAGNOSIS — I700 Atherosclerosis of aorta: Secondary | ICD-10-CM | POA: Insufficient documentation

## 2021-05-26 DIAGNOSIS — J449 Chronic obstructive pulmonary disease, unspecified: Secondary | ICD-10-CM | POA: Diagnosis not present

## 2021-05-26 DIAGNOSIS — Z7982 Long term (current) use of aspirin: Secondary | ICD-10-CM | POA: Insufficient documentation

## 2021-05-26 DIAGNOSIS — I10 Essential (primary) hypertension: Secondary | ICD-10-CM | POA: Diagnosis not present

## 2021-05-26 DIAGNOSIS — M47812 Spondylosis without myelopathy or radiculopathy, cervical region: Secondary | ICD-10-CM | POA: Diagnosis present

## 2021-05-26 HISTORY — DX: Type 2 diabetes mellitus without complications: E11.9

## 2021-05-26 LAB — DIFFERENTIAL
Abs Immature Granulocytes: 0.03 10*3/uL (ref 0.00–0.07)
Basophils Absolute: 0 10*3/uL (ref 0.0–0.1)
Basophils Relative: 0 %
Eosinophils Absolute: 0.1 10*3/uL (ref 0.0–0.5)
Eosinophils Relative: 0 %
Immature Granulocytes: 0 %
Lymphocytes Relative: 14 %
Lymphs Abs: 1.5 10*3/uL (ref 0.7–4.0)
Monocytes Absolute: 0.4 10*3/uL (ref 0.1–1.0)
Monocytes Relative: 4 %
Neutro Abs: 9.1 10*3/uL — ABNORMAL HIGH (ref 1.7–7.7)
Neutrophils Relative %: 82 %

## 2021-05-26 LAB — PROTIME-INR
INR: 1 (ref 0.8–1.2)
Prothrombin Time: 12.8 seconds (ref 11.4–15.2)

## 2021-05-26 LAB — URINALYSIS, ROUTINE W REFLEX MICROSCOPIC
Bilirubin Urine: NEGATIVE
Glucose, UA: NEGATIVE mg/dL
Hgb urine dipstick: NEGATIVE
Ketones, ur: NEGATIVE mg/dL
Leukocytes,Ua: NEGATIVE
Nitrite: NEGATIVE
Protein, ur: 30 mg/dL — AB
Specific Gravity, Urine: 1.012 (ref 1.005–1.030)
pH: 5 (ref 5.0–8.0)

## 2021-05-26 LAB — COMPREHENSIVE METABOLIC PANEL
ALT: 18 U/L (ref 0–44)
AST: 20 U/L (ref 15–41)
Albumin: 3.4 g/dL — ABNORMAL LOW (ref 3.5–5.0)
Alkaline Phosphatase: 120 U/L (ref 38–126)
Anion gap: 16 — ABNORMAL HIGH (ref 5–15)
BUN: 17 mg/dL (ref 8–23)
CO2: 17 mmol/L — ABNORMAL LOW (ref 22–32)
Calcium: 9 mg/dL (ref 8.9–10.3)
Chloride: 104 mmol/L (ref 98–111)
Creatinine, Ser: 1.08 mg/dL — ABNORMAL HIGH (ref 0.44–1.00)
GFR, Estimated: 56 mL/min — ABNORMAL LOW (ref >60)
Glucose, Bld: 81 mg/dL (ref 70–99)
Potassium: 4.6 mmol/L (ref 3.5–5.1)
Sodium: 137 mmol/L (ref 135–145)
Total Bilirubin: 0.2 mg/dL — ABNORMAL LOW (ref 0.3–1.2)
Total Protein: 6.7 g/dL (ref 6.5–8.1)

## 2021-05-26 LAB — RAPID URINE DRUG SCREEN, HOSP PERFORMED
Amphetamines: NOT DETECTED
Barbiturates: NOT DETECTED
Benzodiazepines: NOT DETECTED
Cocaine: NOT DETECTED
Opiates: NOT DETECTED
Tetrahydrocannabinol: POSITIVE — AB

## 2021-05-26 LAB — CBC
HCT: 43.6 % (ref 36.0–46.0)
Hemoglobin: 13.3 g/dL (ref 12.0–15.0)
MCH: 26.8 pg (ref 26.0–34.0)
MCHC: 30.5 g/dL (ref 30.0–36.0)
MCV: 87.9 fL (ref 80.0–100.0)
Platelets: 203 10*3/uL (ref 150–400)
RBC: 4.96 MIL/uL (ref 3.87–5.11)
RDW: 16.5 % — ABNORMAL HIGH (ref 11.5–15.5)
WBC: 11.2 10*3/uL — ABNORMAL HIGH (ref 4.0–10.5)
nRBC: 0 % (ref 0.0–0.2)

## 2021-05-26 LAB — I-STAT CHEM 8, ED
BUN: 17 mg/dL (ref 8–23)
Calcium, Ion: 1.21 mmol/L (ref 1.15–1.40)
Chloride: 105 mmol/L (ref 98–111)
Creatinine, Ser: 1.1 mg/dL — ABNORMAL HIGH (ref 0.44–1.00)
Glucose, Bld: 81 mg/dL (ref 70–99)
HCT: 44 % (ref 36.0–46.0)
Hemoglobin: 15 g/dL (ref 12.0–15.0)
Potassium: 4.3 mmol/L (ref 3.5–5.1)
Sodium: 141 mmol/L (ref 135–145)
TCO2: 28 mmol/L (ref 22–32)

## 2021-05-26 LAB — CBG MONITORING, ED
Glucose-Capillary: 76 mg/dL (ref 70–99)
Glucose-Capillary: 54 mg/dL — ABNORMAL LOW (ref 70–99)
Glucose-Capillary: 90 mg/dL (ref 70–99)
Glucose-Capillary: 240 mg/dL — ABNORMAL HIGH (ref 70–99)

## 2021-05-26 LAB — PHOSPHORUS: Phosphorus: 3.6 mg/dL (ref 2.5–4.6)

## 2021-05-26 LAB — GLUCOSE, CAPILLARY
Glucose-Capillary: 154 mg/dL — ABNORMAL HIGH (ref 70–99)
Glucose-Capillary: 198 mg/dL — ABNORMAL HIGH (ref 70–99)

## 2021-05-26 LAB — RESP PANEL BY RT-PCR (FLU A&B, COVID) ARPGX2
Influenza A by PCR: NEGATIVE
Influenza B by PCR: NEGATIVE
SARS Coronavirus 2 by RT PCR: NEGATIVE

## 2021-05-26 LAB — MAGNESIUM: Magnesium: 1.4 mg/dL — ABNORMAL LOW (ref 1.7–2.4)

## 2021-05-26 LAB — ETHANOL: Alcohol, Ethyl (B): <10 mg/dL (ref <10)

## 2021-05-26 LAB — HEMOGLOBIN A1C
Hgb A1c MFr Bld: 7.7 % — ABNORMAL HIGH (ref 4.8–5.6)
Mean Plasma Glucose: 174.29 mg/dL

## 2021-05-26 LAB — HIV ANTIBODY (ROUTINE TESTING W REFLEX): HIV Screen 4th Generation wRfx: NONREACTIVE

## 2021-05-26 LAB — APTT: aPTT: 27 seconds (ref 24–36)

## 2021-05-26 MED ORDER — ENOXAPARIN SODIUM 40 MG/0.4ML IJ SOSY
40.0000 mg | PREFILLED_SYRINGE | INTRAMUSCULAR | Status: DC
  Administered 2021-05-26 – 2021-05-27 (×2): 40 mg via SUBCUTANEOUS
  Filled 2021-05-26 (×2): qty 0.4

## 2021-05-26 MED ORDER — ACETAMINOPHEN 325 MG PO TABS
650.0000 mg | ORAL_TABLET | Freq: Four times a day (QID) | ORAL | Status: DC | PRN
  Administered 2021-05-27: 650 mg via ORAL
  Filled 2021-05-26: qty 2

## 2021-05-26 MED ORDER — LACTATED RINGERS IV BOLUS
1000.0000 mL | Freq: Once | INTRAVENOUS | Status: AC
  Administered 2021-05-26: 1000 mL via INTRAVENOUS

## 2021-05-26 MED ORDER — ACETAMINOPHEN 650 MG RE SUPP: 650.0000 mg | Freq: Four times a day (QID) | RECTAL | Status: DC | PRN

## 2021-05-26 MED ORDER — INSULIN DETEMIR 100 UNIT/ML ~~LOC~~ SOLN
40.0000 [IU] | Freq: Two times a day (BID) | SUBCUTANEOUS | Status: DC
  Administered 2021-05-26 – 2021-05-27 (×2): 40 [IU] via SUBCUTANEOUS
  Filled 2021-05-26 (×3): qty 0.4

## 2021-05-26 MED ORDER — INSULIN ASPART 100 UNIT/ML IJ SOLN
0.0000 [IU] | Freq: Three times a day (TID) | INTRAMUSCULAR | Status: DC
  Administered 2021-05-26: 3 [IU] via SUBCUTANEOUS
  Administered 2021-05-26: 5 [IU] via SUBCUTANEOUS
  Administered 2021-05-27: 2 [IU] via SUBCUTANEOUS
  Administered 2021-05-27: 3 [IU] via SUBCUTANEOUS

## 2021-05-26 NOTE — Progress Notes (Signed)
NEW ADMISSION NOTE ?New Admission Note:  ? ?Arrival Method: stretcher  ?Mental Orientation: A&OX4 ?Telemetry: 5M15 ?Assessment: Completed ?Skin: intact  abrasion on face nose, bruising on forehead, left lateral great toe black, scratches bilateral arm ?IV: RAC ?Pain:0/10 ?Tubes:NONE ?Safety Measures: Safety Fall Prevention Plan has been given, discussed and signed ?Admission: Completed ?[facility] Orientation: Patient has been orientated to the room, unit and staff.  ?Family: daughter  ? ?Orders have been reviewed and implemented. Will continue to monitor the patient. Call light has been placed within reach and bed alarm has been activated.  ? ?[place] Nicolet Griffy, RN   ?

## 2021-05-26 NOTE — ED Triage Notes (Signed)
The pt has not b een  tgriaged yet  now they aRE HERE FROM [facility] doing portable rays.  I have not completed her vitals or gotten her undressed yet pt alert ?

## 2021-05-26 NOTE — Consult Note (Signed)
Neurology Consult H&P ? ?[name] ?MR# [id number] ?05/26/2021 ? ? ?CC: hypoglycemia and fall ? ?History is obtained from: patient, EMS and chart. ? ?HPI: [name] is a 71 y.o. female PMHx COPD, DM, HTN, OA, gout, with AMS in the setting of hypoglycemia and fall.  On arrival EMS found blood glucose of 34 and she received aphasic on exam and was brought in as code stroke.  ? ?She was seen early February for similar presentation at [facility] and she stated her blood glucose normally runs in the 160s. ? ?LKW: Unclear ?tNK given: No not a candidate ?IR Thrombectomy No, not indicated ?Modified Rankin Scale: 0-Completely asymptomatic and back to baseline post- stroke ?NIHSS: 6 ? ?ROS: A complete ROS was performed and is negative except as noted in the HPI.  ? ?No past medical history on file. ? ? ?No family history on file. ? ?Social History:  has no history on file for tobacco use, alcohol use, and drug use. ? ? ?Prior to Admission medications   ?Medication Sig Start Date End Date Taking? Authorizing Provider  ?amLODipine (NORVASC) 5 MG tablet Take 5 mg by mouth daily.   Yes [provider]  ?aspirin EC 81 MG tablet Take 81 mg by mouth daily. Swallow whole.   Yes [provider]  ?atorvastatin (LIPITOR) 10 MG tablet Take 10 mg by mouth daily.   Yes [provider]  ?buPROPion (ZYBAN) 150 MG 12 hr tablet Take 150 mg by mouth 2 (two) times daily. 05/07/21  Yes [provider]  ?Calcium Carb-Cholecalciferol (CALCIUM + VITAMIN D3 PO) Take 1 tablet by mouth daily.   Yes [provider]  ?carvedilol (COREG) 25 MG tablet Take 25 mg by mouth 2 (two) times daily with a meal.   Yes [provider]  ?lisinopril (ZESTRIL) 40 MG tablet Take 40 mg by mouth daily.   Yes [provider]  ?meloxicam (MOBIC) 15 MG tablet Take 15 mg by mouth daily. 05/07/21  Yes [provider]  ?NOVOLIN 70/30 (70-30) 100 UNIT/ML injection Inject 55-60 Units into the  skin 2 (two) times daily with a meal. 60 units in the morning ?55 units at dinner 05/07/21  Yes [provider]  ?vitamin B-12 (CYANOCOBALAMIN) 1000 MCG tablet Take 1,000 mcg by mouth daily.   Yes [provider]  ?aspirin EC 325 MG tablet Take 1 tablet (325 mg total) by mouth daily. 05/11/21 08/09/21  Dionne Bucy, MD  ?LYRICA 100 MG capsule Take 100 mg by mouth 2 (two) times daily. 05/10/21   [provider]  ?TRULICITY 0.75 MG/0.5ML SOPN Inject 0.75 mg into the skin once a week. 05/10/21   [provider]  ? ? ?Exam: ?Current vital signs: ?BP (!) 148/106   Pulse 71   Temp (!) 97.4 ?F (36.3 ?C)   Resp 17   Wt 89.2 kg   SpO2 100%  ? ?Physical Exam  ?Constitutional: Appears well-developed and well-nourished.  ?Psych: Affect appropriate to situation ?Eyes: No scleral injection ?HENT: No OP obstruction. ?Head: Normocephalic.  ?Cardiovascular: Normal rate and regular rhythm.  ?Respiratory: Effort normal, symmetric excursions bilaterally, no audible wheezing. ?GI: Soft.  No distension. There is no tenderness.  ?Skin: WDI ? ?Neuro: ?Mental Status: ?Patient is awake, alert, oriented to person, place, month, year, and situation. ?Patient is able to give a clear and coherent history. ?Speech fluent, intact comprehension and repetition. ?No signs of aphasia or neglect. ?Visual Fields are full. Pupils are equal, round, and  reactive to light. ?EOMI without ptosis or diplopia.  ?Facial sensation is symmetric to temperature ?Facial movement is symmetric.  ?Hearing is intact to voice. ?Uvula midline and palate elevates symmetrically. ?Shoulder shrug is symmetric. ?Tongue is midline without atrophy or fasciculations.  ?Tone is normal. Bulk is normal.  Right upper extremity limited by severe pain in shoulder which is chronic.  Bilateral lower extremity exam was limited due to chronic left knee pain with weak hip flexion also chronic as well as right knee pain. ?Sensation is symmetric to  light touch decreased in t right lower extremity this may be chronic. ?Deep Tendon Reflexes: 2+ and symmetric in the biceps and patellae. ?Toes are downgoing bilaterally. ?FNF and HKS are intact bilaterally. ?Gait - Deferred ? ?I have reviewed labs in epic and the pertinent results are: ?CBG 76 ? ?I have reviewed the images obtained: ?NCT head showed No acute cortically based infarct or acute intracranial hemorrhage identified. ASPECTS 10. ? ?Assessment: [name] is a 71 y.o. female PMHx diabetes with hypoglycemic encephalopathy.  Recently seen at [facility] for the same however on this occasion her blood glucose was 34 and she had fall.  When she was last seen she did not measure her blood sugars regularly and she did not have a home glucometer however she states now that she does have a new glucometer at home. ? ?Impression:  ?Hypoglycemic encephalopathy ?Fall from standing ? ?Plan: ?- She states she is back to baseline, her exam is reassuring she does not have any significant deficits which are new and therefore cancel code stroke. ?- Neurology will remain available, please call for questions. ? ?This patient is critically ill and at significant risk of neurological worsening, death and care requires constant monitoring of vital signs, hemodynamics,respiratory and cardiac monitoring, neurological assessment, discussion with family, other specialists and medical decision making of high complexity. I spent 65 minutes of neurocritical care time  in the care of  this patient. This was time spent independent of any time provided by nurse practitioner or PA. ? ?Electronically signed by:  ?Marisue Humble, MD ?Page: [phone] ?05/26/2021, 7:34 AM ? ?If 7pm- 7am, please page neurology on call as listed in AMION. ? ?

## 2021-05-26 NOTE — ED Triage Notes (Signed)
The pt returned from c -t  according to dr wickline the pt needed portable rays after everyone left the room I did a swallow screen gave her a CUP OF ORANGE JUICE   DR Hooper OFF and that   the pt was not a code stroke when I asked if it was canceled h reported that the neuro doctor  had caNCELED Shepherd.  FOOD GIVEN TO THE PT TO EAT  BEDPAN FINALLY WAS REMOVED   THE BED WAS WET LINEDN CHANGED  A AND O  4 ?

## 2021-05-26 NOTE — H&P (Addendum)
Date: 05/26/2021     Patient Name:  [name] MRN: [id number]  DOB: 09-Mar-1951 Age / Sex: 71 y.o., female   PCP: Letta Median, MD         Medical Service: Internal Medicine Teaching Service       Attending Physician: Dr. Aldine Contes, MD    Intern (1st Contact): Scarlett Presto, MD Pager: AD [phone]  Resident (2nd Contact): Virl Axe, MD Pager: Sandrea Hammond [phone]       After Hours (After 5p/  First Contact Pager: [phone]  weekends / holidays): Second Contact Pager: [phone]   SUBJECTIVE:  Chief Complaint: Hypoglycemia  History of Present Illness: [name] is a functionally independent 71 y.o. female with a pertinent PMH of COPD, DM, HTN, OA, gout, who presents to [facility] with AMS in the setting of hypoglycemia.   Patient presents to [facility] ED after a syncopal episode in which she was found down unconscious this morning at 4 AM by her husband.  Patient states that she fell asleep on the recliner at 4 AM remembers getting up to try to use the restroom prior to falling to the ground.  Next he remembers is her husband waking her up.  EMS was called to the scene with CBG showing 34.  She was given oral glucose tablets with improvement of her blood sugar and brought to [facility] emergency room.  Patient states that she has had multiple episodes of hypoglycemia with syncope over the past 2 to 3 weeks.  Her blood sugars seem to be consistently low in the 50s and 60s.  She was being worked up for regular blood sugars at her nutritionist and currently received continuous glucose monitor.  Unfortunately, she has not had that set up yet.  Patient states that during these episodes she becomes very weak particular on her left side and significant diaphoresis.  She denies any chest pain, dizziness, palpitations, shortness of breath, nausea, or vomiting.  She states that she is currently taking Novolin 70/30 twice daily.  She is taking 60 units in the morning with breakfast and 55 units  in the evening with dinner.  She is also taking metformin 1000 mg twice daily.  She was recently prescribed Trulicity but has yet to start this medication.  She states that she normally wakes up around 11 AM and takes her blood sugar on empty stomach.  The CBGs are usually in the 62s.  She takes her insulin after eating at 11:00.  She usually eats dinner around 730 and takes her blood sugar again which is again around 60 as well.  She states that this is her normal daily pattern of insulin usage.  She also endorses inconsistent meals and snacks. She is unsure of what her most recent A1c was and we do not have a current A1c on file.  When patient presented to the ED there was concern the patient was having focal neurologic symptoms including left-sided weakness.  The symptoms have since resolved and she is alert and oriented without focal neurologic deficits.  She states she is back to her baseline and denies any review of symptoms.  Medications: No current facility-administered medications on file prior to encounter.   Current Outpatient Medications on File Prior to Encounter  Medication Sig Dispense Refill   amLODipine (NORVASC) 5 MG tablet Take 5 mg by mouth daily.     aspirin EC 81 MG tablet Take 81 mg by mouth daily. Swallow whole.  atorvastatin (LIPITOR) 10 MG tablet Take 10 mg by mouth daily.     buPROPion (ZYBAN) 150 MG 12 hr tablet Take 150 mg by mouth 2 (two) times daily.     Calcium Carb-Cholecalciferol (CALCIUM + VITAMIN D3 PO) Take 1 tablet by mouth daily.     carvedilol (COREG) 25 MG tablet Take 25 mg by mouth 2 (two) times daily with a meal.     lisinopril (ZESTRIL) 40 MG tablet Take 40 mg by mouth daily.     meloxicam (MOBIC) 15 MG tablet Take 15 mg by mouth daily.     NOVOLIN 70/30 (70-30) 100 UNIT/ML injection Inject 55-60 Units into the skin 2 (two) times daily with a meal. 60 units in the morning 55 units at dinner     vitamin B-12 (CYANOCOBALAMIN) 1000 MCG tablet Take 1,000  mcg by mouth daily.     aspirin EC 325 MG tablet Take 1 tablet (325 mg total) by mouth daily. 30 tablet 2   LYRICA 100 MG capsule Take 100 mg by mouth 2 (two) times daily.     TRULICITY [id number] SOPN Inject 0.75 mg into the skin once a week.      Past Medical History: Past Medical History:  Diagnosis Date   Diabetes mellitus without complication [facility])     Social:  Lives - [address] [place],  [place] [address], spouse Support - Good Level of function: Independent,walk without assistance, Occupation - unknown PCP - Bender, Abby Daneele Substance use: Nonprescription/Illicit - denied.  ETOH - none Tobacco - Smoked 1 packs per day for 30 years  Family History: No family history on file.  Allergies: Allergies as of 05/26/2021 - Review Complete 05/26/2021  Allergen Reaction Noted   Phenylmercuric nitrate Other (See Comments) 12/29/2018    Review of Systems: A complete ROS was negative except as per HPI.   OBJECTIVE:  Physical Exam: Blood pressure (!) 147/75, pulse 75, temperature (!) 97.4 F (36.3 C), resp. rate 19, height 5\' 3"  (1.6 m), weight 89.2 kg, SpO2 99 %. Physical Exam Constitutional:      Appearance: Normal appearance.  HENT:     Head: Normocephalic and atraumatic.  Eyes:     Extraocular Movements: Extraocular movements intact.  Cardiovascular:     Rate and Rhythm: Normal rate.     Pulses: Normal pulses.     Heart sounds: Normal heart sounds.  Pulmonary:     Effort: Pulmonary effort is normal.     Breath sounds: Normal breath sounds.  Abdominal:     General: Bowel sounds are normal.     Palpations: Abdomen is soft.     Tenderness: There is no abdominal tenderness.  Musculoskeletal:        General: Normal range of motion.     Cervical back: Normal range of motion.     Right lower leg: No edema.     Left lower leg: No edema.  Skin:    General: Skin is warm and dry.     Findings: Abrasion (on face) present.  Neurological:     Mental Status: She  is alert and oriented to person, place, and time. Mental status is at baseline.  Psychiatric:        Mood and Affect: Mood normal.    Pertinent Labs: CBC    Component Value Date/Time   WBC 11.2 (H) 05/26/2021 0528   RBC 4.96 05/26/2021 0528   HGB 15.0 05/26/2021 0535   HGB 11.4 (L) 04/18/2013 1655   HCT  44.0 05/26/2021 0535   HCT 37.0 04/18/2013 1655   PLT 203 05/26/2021 0528   PLT 181 04/18/2013 1655   MCV 87.9 05/26/2021 0528   MCV 80 04/18/2013 1655   MCH 26.8 05/26/2021 0528   MCHC 30.5 05/26/2021 0528   RDW 16.5 (H) 05/26/2021 0528   RDW 16.9 (H) 04/18/2013 1655   LYMPHSABS 1.5 05/26/2021 0528   MONOABS 0.4 05/26/2021 0528   EOSABS 0.1 05/26/2021 0528   BASOSABS 0.0 05/26/2021 0528     CMP     Component Value Date/Time   NA 141 05/26/2021 0535   NA 134 (L) 04/18/2013 1655   K 4.3 05/26/2021 0535   K 4.2 04/18/2013 1655   CL 105 05/26/2021 0535   CL 99 04/18/2013 1655   CO2 17 (L) 05/26/2021 0528   CO2 28 04/18/2013 1655   GLUCOSE 81 05/26/2021 0535   GLUCOSE 239 (H) 04/18/2013 1655   BUN 17 05/26/2021 0535   BUN 13 04/18/2013 1655   CREATININE 1.10 (H) 05/26/2021 0535   CREATININE 1.01 04/18/2013 1655   CALCIUM 9.0 05/26/2021 0528   CALCIUM 9.2 04/18/2013 1655   PROT 6.7 05/26/2021 0528   PROT 7.4 04/18/2013 1655   ALBUMIN 3.4 (L) 05/26/2021 0528   ALBUMIN 3.2 (L) 04/18/2013 1655   AST 20 05/26/2021 0528   AST 19 04/18/2013 1655   ALT 18 05/26/2021 0528   ALT 18 04/18/2013 1655   ALKPHOS 120 05/26/2021 0528   ALKPHOS 88 04/18/2013 1655   BILITOT 0.2 (L) 05/26/2021 0528   BILITOT 0.2 04/18/2013 1655   GFRNONAA 56 (L) 05/26/2021 0528   GFRNONAA >60 04/18/2013 1655   GFRAA >60 04/18/2013 1655   CBG (last 3)  Recent Labs    05/26/21 0526 05/26/21 0631 05/26/21 0743  GLUCAP 76 54* 90     Pertinent Imaging: CT Cervical Spine Wo Contrast Result Date: 05/26/2021 FINDINGS: Alignment: Straightening of cervical lordosis appears stable from last  month. Cervicothoracic junction alignment is within normal limits. Bilateral posterior element alignment is within normal limits. Skull base and vertebrae: Visualized skull base is intact. No atlanto-occipital dissociation. C1 and C2 appear stable and intact. Generalized osteopenia. Degenerative endplate sclerosis. No acute osseous abnormality identified. Soft tissues and spinal canal: No prevertebral fluid or swelling. No visible canal hematoma. Retropharyngeal carotids with calcified atherosclerosis greater on the right. 10 mm hypodense right thyroid nodule. Not clinically significant; no follow-up imaging recommended (ref: J Am Coll Radiol. 2015 Feb;12(2): 143-50). Disc levels: Widespread severe disc and endplate degeneration in the cervical spine, sparing only C2-C3. Widespread associated spinal stenosis, mild-to-moderate. Upper chest: Upper thoracic disc and endplate degeneration. Grossly intact visible upper thoracic levels. Negative lung apices. Calcified aortic atherosclerosis. IMPRESSION: 1. No acute traumatic injury identified in the cervical spine. 2. 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 3. Aortic Atherosclerosis (ICD10-I70.0).   DG Chest Port 1 View Result Date: 05/26/2021 FINDINGS: Portable AP semi upright view at 0602 hours. Cardiomegaly. Lung volumes are at the upper limits of normal. Other mediastinal contours are within normal limits. Visualized tracheal air column is within normal limits. Allowing for portable technique the lungs are clear. No pneumothorax or pleural effusion. Right shoulder reported separately. Diffuse thoracic disc and endplate degeneration.  IMPRESSION: Cardiomegaly and borderline pulmonary hyperinflation. No acute cardiopulmonary abnormality.    DG Shoulder Right Port Result Date: 05/26/2021 FINDINGS: 3 portable views. No glenohumeral joint dislocation. Nondisplaced humeral head fracture suspected, with impacted appearance of  trabeculae near the confluence  of the humeral head and neck. Right clavicle and scapula appear intact with degeneration. And since 2015 near complete effacement of the subacromial space compatible with severe rotator cuff degeneration. Visible right ribs and chest appear negative.  IMPRESSION: 1. Severe right shoulder degeneration since a 2015 comparison which might explain the altered appearance of the humeral head since that time, but cannot exclude an impacted nondisplaced right humeral head fracture. Noncontrast Shoulder CT or MRI should be confirmatory. 2. No other fracture identified, no glenohumeral dislocation.   DG Hip Port [place] Pelvis 1 View Right Result Date: 05/26/2021 FINDINGS: Portable AP supine views at 0607 hours. Femoral heads are normally located. Pelvis appears stable and intact. Grossly intact proximal left femur. On single AP view of the right hip right proximal femur appears stable and intact. Negative lower abdominal and pelvic visceral contours.  IMPRESSION: No acute fracture or dislocation identified about the right hip or pelvis.   CT HEAD CODE STROKE WO CONTRAST Result Date: 05/26/2021 FINDINGS: Brain: Stable cerebral volume. 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 No midline shift, mass effect, or evidence of intracranial mass lesion. No ventriculomegaly. Stable gray-white matter differentiation throughout the brain. Mild patchy bilateral white matter hypodensity. No cortically based acute infarct identified. No acute intracranial hemorrhage identified. Vascular: Calcified atherosclerosis at the skull base. No suspicious intracranial vascular hyperdensity. Skull: Osteopenia. Stable, intact. Sinuses/Orbits: Visualized paranasal sinuses and mastoids are stable and well aerated. Other: Visualized orbits and scalp soft tissues are within normal limits. ASPECTS [facility] Stroke Program Early CT Score) Total score (0-10 with 10 being normal): 10  IMPRESSION: 1. No  acute cortically based infarct or acute intracranial hemorrhage identified. ASPECTS 10. 2. Stable non contrast CT appearance of chronic small vessel disease.  EKG: normal EKG, normal sinus rhythm, unchanged from previous tracings  ASSESSMENT & PLAN:  Patient Summary: [name] is a functionally independent 71 y.o. female with a pertinent PMH of COPD, DM, HTN, OA, gout, who presents to [facility] with AMS in the setting of syncope and admit for hypoglycemia.   Assessment: Principal Problem:   Syncope Active Problems:   Hypoglycemia   Plan: Syncope 2/2 to Hypoglycemia: T2DM: Patient presents after syncopal episode in the setting of significant hypoglycemia.  Patient's hypoglycemia seems to be secondary to her insulin use.  She is using Novolin 70/30 when she wakes up at 11 AM and then again 8 hours later at dinner at 7:30 PM.  She is using significantly elevated doses of this medication with 60 units in the morning and 55 units in the evening.  She is likely supratherapeutic and possibly stacking causing her to have acute low CBGs on top of her baseline hypoglycemia.  She also endorses inconsistent meals which is likely causing worsening variations in her CBGs. -We will admit patient for CBG monitoring -We will need to decrease her insulin slightly -Consider starting 50 units BID with meals -We will get an updated A1c -We will start SSI with meals in the meantime to monitor her requirements. -Counseled her on appropriate usage of her insulin and the need for consistent meals.   -Consulted diabetic educator for further education regarding insulin usage. -Consulted pharmacy for updated med rec  Best Practice: Diet: Diabetic diet IVF: none VTE: enoxaparin (LOVENOX) injection 40 mg Start: 05/26/21 0830 SCDs Start: 05/26/21 [id number] Code: Full Code AB: none Dispo: Observation with expected length of stay less than 2 midnights. Anticipated Discharge Location: Home Barriers to Discharge:   insulin recommendations  Family Contact:  spouse and son , at bedside.  Signature: Lawerance Cruel, D.O.  Internal Medicine Resident, PGY-3 [facility]  Pager #: [phone] (If no response, contact on-call pager) 10:57 AM, 05/26/2021   Please contact the on-call pager after 5 pm and on weekends at [phone].

## 2021-05-26 NOTE — ED Notes (Signed)
Passed stroke swallow ,screen ?

## 2021-05-26 NOTE — Code Documentation (Addendum)
Responded to Code Stroke called at 0510 for R sided weakness/numbness, LSN-last night. Per EMS, husband found pt on floor, CBG initially 34. CBG up to 162 after treatment by EMS. Pt arrived at 0523, CBG-76, NIH-6, CT head negative for acute changes. TNK not given-pt outside window. Plan ASA. ?

## 2021-05-26 NOTE — ED Provider Notes (Signed)
[facility] Provider [facility]   CSN: [id number] Arrival date & time: 05/26/21  [id number]     History  Chief Complaint - weakness  Level 5 caveat due to acuity of condition Kathleen Gwen PoundsJean [name] is a 71 y.o. female.  The history is provided by the EMS personnel and the patient.  Weakness Severity:  Severe Onset quality:  Sudden Timing:  Constant Progression:  Improving Chronicity:  New Relieved by:  Nothing Worsened by:  Nothing    Patient with history of hypertension, diabetes presents for altered mental status.  Per EMS, patient was found on the floor by family.  She had signs of head trauma.  Initial glucose 34.  patient began to wake up but appeared to have weakness and a code stroke was called  Home Medications Prior to Admission medications   Medication Sig Start Date End Date Taking? Authorizing Provider  amLODipine (NORVASC) 5 MG tablet Take 5 mg by mouth daily.    [provider]  aspirin EC 325 MG tablet Take 1 tablet (325 mg total) by mouth daily. 05/11/21 08/09/21  Dionne BucySiadecki, Sebastian, MD  atorvastatin (LIPITOR) 10 MG tablet Take 10 mg by mouth daily.    [provider]  buPROPion (WELLBUTRIN SR) 150 MG 12 hr tablet Take 1 tablet by mouth daily. For 7 days then in crease to bid 05/07/21 05/14/21  [provider]  buPROPion (ZYBAN) 150 MG 12 hr tablet Take 150 mg by mouth 2 (two) times daily. 05/07/21   [provider]  busPIRone (BUSPAR) 5 MG tablet Take 5 mg by mouth 2 (two) times daily. 05/07/21   [provider]  carvedilol (COREG) 25 MG tablet Take 25 mg by mouth 2 (two) times daily with a meal.    [provider]  lisinopril (ZESTRIL) 40 MG tablet Take 40 mg by mouth daily.    [provider]  LYRICA 100 MG capsule Take 100 mg by mouth 2 (two) times daily. 05/10/21   [provider]  meloxicam (MOBIC) 15 MG tablet Take 15 mg by mouth daily. 05/07/21   [provider]  NOVOLIN 70/30 (70-30) 100 UNIT/ML injection Inject 55-60 Units into the skin 2 (two) times daily with a meal. 55 im am and 60 with dinner 05/07/21   [provider]  TRULICITY 0.75 MG/0.5ML SOPN Inject into the skin. 05/10/21   [provider]      Allergies    Phenylmercuric nitrate    Review of Systems   Review of Systems  Unable to perform ROS: Acuity of condition  Neurological:  Positive for weakness.   Physical Exam Updated Vital Signs BP (!) 148/106    Pulse 71    Temp (!) 97.4 F (36.3 C)    Resp 17    Wt 89.2 kg    SpO2 100%  Physical Exam CONSTITUTIONAL: Disheveled and appears confused HEAD: Abrasions noted to forehead, otherwise no signs of head trauma EYES: EOMI/PERRL ENMT: Mucous membranes moist NECK: Cervical collar in place SPINE/BACK: No bruising/crepitance/stepoffs noted to spine CV: S1/S2 noted, no murmurs/rubs/gallops noted LUNGS: Lungs are clear to auscultation bilaterally, no apparent distress ABDOMEN: soft, nontender, no rebound or guarding, bowel sounds noted throughout abdomen GU:no cva tenderness NEURO: Pt is somnolent EXTREMITIES: pulses normal/equal, full ROM, tenderness with range of motion of right hip, right shoulder. All other extremities/joints palpated/ranged and nontender SKIN: warm, color normal PSYCH: Unable to assess ED Results / Procedures / Treatments   Labs (all  labs ordered are listed, but only abnormal results are displayed) Labs Reviewed  CBC - Abnormal; Notable for the following components:      Result Value   WBC 11.2 (*)    RDW 16.5 (*)    All other components within normal limits  DIFFERENTIAL - Abnormal; Notable for the following components:   Neutro Abs 9.1 (*)    All other components within normal limits  COMPREHENSIVE METABOLIC PANEL - Abnormal; Notable for the following components:   CO2 17 (*)    Creatinine, Ser 1.08 (*)    Albumin 3.4 (*)    Total Bilirubin 0.2 (*)    GFR, Estimated 56 (*)     Anion gap 16 (*)    All other components within normal limits  I-STAT CHEM 8, ED - Abnormal; Notable for the following components:   Creatinine, Ser 1.10 (*)    All other components within normal limits  RESP PANEL BY RT-PCR (FLU A&B, COVID) ARPGX2  ETHANOL  PROTIME-INR  APTT  RAPID URINE DRUG SCREEN, HOSP PERFORMED  URINALYSIS, ROUTINE W REFLEX MICROSCOPIC  CBG MONITORING, ED  CBG MONITORING, ED  CBG MONITORING, ED    EKG EKG Interpretation  Date/Time:  Saturday May 26 2021 06:36:23 EST Ventricular Rate:  72 PR Interval:  139 QRS Duration: 94 QT Interval:  415 QTC Calculation: 455 R Axis:   32 Text Interpretation: Sinus rhythm Nonspecific T abnormalities, lateral leads Confirmed by Zadie Rhine ([id number]) on 05/26/2021 6:40:03 AM  Radiology CT Cervical Spine Wo Contrast  Result Date: 05/26/2021 CLINICAL DATA:  Code stroke. 71 year old female with right side weakness. Fall. EXAM: CT CERVICAL SPINE WITHOUT CONTRAST TECHNIQUE: Multidetector CT imaging of the cervical spine was performed without intravenous contrast. Multiplanar CT image reconstructions were also generated. RADIATION DOSE REDUCTION: This exam was performed according to the departmental dose-optimization program which includes automated exposure control, adjustment of the mA and/or kV according to patient size and/or use of iterative reconstruction technique. COMPARISON:  Head CT today reported separately. CTA head and neck 05/11/2021. FINDINGS: Alignment: Straightening of cervical lordosis appears stable from last month. Cervicothoracic junction alignment is within normal limits. Bilateral posterior element alignment is within normal limits. Skull base and vertebrae: Visualized skull base is intact. No atlanto-occipital dissociation. C1 and C2 appear stable and intact. Generalized osteopenia. Degenerative endplate sclerosis. No acute osseous abnormality identified. Soft tissues and spinal canal: No prevertebral fluid or  swelling. No visible canal hematoma. Retropharyngeal carotids with calcified atherosclerosis greater on the right. 10 mm hypodense right thyroid nodule. Not clinically significant; no follow-up imaging recommended (ref: J Am Coll Radiol. 2015 Feb;12(2): 143-50). Disc levels: Widespread severe disc and endplate degeneration in the cervical spine, sparing only C2-C3. Widespread associated spinal stenosis, mild-to-moderate. Upper chest: Upper thoracic disc and endplate degeneration. Grossly intact visible upper thoracic levels. Negative lung apices. Calcified aortic atherosclerosis. IMPRESSION: 1. No acute traumatic injury identified in the cervical spine. 2. 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 3. Aortic Atherosclerosis (ICD10-I70.0). Electronically Signed   By: Odessa Fleming M.D.   On: 05/26/2021 05:51   DG Chest Port 1 View  Result Date: 05/26/2021 CLINICAL DATA:  71 year old female with chest and right shoulder pain. Fall. EXAM: PORTABLE CHEST 1 VIEW COMPARISON:  Chest radiographs 03/20/2009. FINDINGS: Portable AP semi upright view at 0602 hours. Cardiomegaly. Lung volumes are at the upper limits of normal. Other mediastinal contours are within normal limits. Visualized tracheal air column is within normal limits. Allowing for portable technique  the lungs are clear. No pneumothorax or pleural effusion. Right shoulder reported separately. Diffuse thoracic disc and endplate degeneration. IMPRESSION: Cardiomegaly and borderline pulmonary hyperinflation. No acute cardiopulmonary abnormality. Electronically Signed   By: Odessa Fleming M.D.   On: 05/26/2021 06:41   DG Shoulder Right Port  Result Date: 05/26/2021 CLINICAL DATA:  71 year old female with chest and right shoulder pain. Fall. EXAM: RIGHT SHOULDER - 1 VIEW COMPARISON:  Right shoulder 04/18/2013. FINDINGS: 3 portable views. No glenohumeral joint dislocation. Nondisplaced humeral head fracture suspected, with impacted  appearance of trabeculae near the confluence of the humeral head and neck. Right clavicle and scapula appear intact with degeneration. And since 2015 near complete effacement of the subacromial space compatible with severe rotator cuff degeneration. Visible right ribs and chest appear negative. IMPRESSION: 1. Severe right shoulder degeneration since a 2015 comparison which might explain the altered appearance of the humeral head since that time, but cannot exclude an impacted nondisplaced right humeral head fracture. Noncontrast Shoulder CT or MRI should be confirmatory. 2. No other fracture identified, no glenohumeral dislocation. Electronically Signed   By: Odessa Fleming M.D.   On: 05/26/2021 06:39   DG Hip Port Unilat W or Wo Pelvis 1 View Right  Result Date: 05/26/2021 CLINICAL DATA:  71 year old female with chest and right shoulder pain. Fall. EXAM: DG HIP (WITH OR WITHOUT PELVIS) 1V PORT RIGHT COMPARISON:  Right hip series 11/23/2014. FINDINGS: Portable AP supine views at 0607 hours. Femoral heads are normally located. Pelvis appears stable and intact. Grossly intact proximal left femur. On single AP view of the right hip right proximal femur appears stable and intact. Negative lower abdominal and pelvic visceral contours. IMPRESSION: No acute fracture or dislocation identified about the right hip or pelvis. Electronically Signed   By: Odessa Fleming M.D.   On: 05/26/2021 06:42   CT HEAD CODE STROKE WO CONTRAST  Result Date: 05/26/2021 CLINICAL DATA:  Code stroke. 71 year old female with right side weakness. Fall. EXAM: CT HEAD WITHOUT CONTRAST TECHNIQUE: Contiguous axial images were obtained from the base of the skull through the vertex without intravenous contrast. RADIATION DOSE REDUCTION: This exam was performed according to the departmental dose-optimization program which includes automated exposure control, adjustment of the mA and/or kV according to patient size and/or use of iterative reconstruction  technique. COMPARISON:  Brain MRI 03/21/2009. Head CT 05/11/2021. FINDINGS: Brain: Stable cerebral volume. 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 No midline shift, mass effect, or evidence of intracranial mass lesion. No ventriculomegaly. Stable gray-white matter differentiation throughout the brain. Mild patchy bilateral white matter hypodensity. No cortically based acute infarct identified. No acute intracranial hemorrhage identified. Vascular: Calcified atherosclerosis at the skull base. No suspicious intracranial vascular hyperdensity. Skull: Osteopenia. Stable, intact. Sinuses/Orbits: Visualized paranasal sinuses and mastoids are stable and well aerated. Other: Visualized orbits and scalp soft tissues are within normal limits. ASPECTS [facility] Stroke Program Early CT Score) Total score (0-10 with 10 being normal): 10 IMPRESSION: 1. No acute cortically based infarct or acute intracranial hemorrhage identified. ASPECTS 10. 2. Stable non contrast CT appearance of chronic small vessel disease. 3. These results were communicated to Dr. Darnelle Maffucci at 5:43 am on 05/26/2021 by text page via the [facility] messaging system. Electronically Signed   By: Odessa Fleming M.D.   On: 05/26/2021 05:48    Procedures Procedures    Medications Ordered in ED Medications - No data to display  ED Course/ Medical Decision Making/ A&P Clinical Course as of 05/26/21 [phone]  Sat May 26, 2021  8768 Code stroke is been canceled.  After discussion with neurology, this is likely due to her hypoglycemia.  Patient is now awake and alert. [DW]  [phone] However this is the second time in less than a month this episode has  occurred.  Patient reports med compliance.  She will be admitted for further management of hypoglycemia [DW]  0633 I was informed glucose is 54, will encourage oral juice [DW]  0721 Discussed with internal medicine teaching service for admission [DW]    Clinical Course User Index [DW] Zadie Rhine, MD                            Medical Decision Making Amount and/or Complexity of Data Reviewed Labs: ordered. Radiology: ordered.  Risk Decision regarding hospitalization.   This patient presents to the ED for concern of weakness, this involves an extensive number of treatment options, and is a complaint that carries with it a high risk of complications and morbidity.  The differential diagnosis includes acute stroke, intracranial hemorrhage, subdural hematoma, hypoglycemia, electrolyte abnormality  Comorbidities that complicate the patient evaluation: Patients presentation is complicated by their history of diabetes   Additional history obtained: Additional history obtained from EMS discussed with EMS upon arrival Records reviewed  previous ED notes reviewed  Lab Tests: I Ordered, and personally interpreted labs.  The pertinent results include: No leukocytosis  Imaging Studies ordered: I ordered imaging studies including CT scan head and C-spine   I independently visualized and interpreted imaging which showed no acute finding I agree with the radiologist interpretation  Cardiac Monitoring: The patient was maintained on a cardiac monitor.  I personally viewed and interpreted the cardiac monitor which showed an underlying rhythm of:  sinus rhythm    Critical Interventions:  given oral fluids for hypoglycemia  Consultations Obtained: I requested consultation with the admitting physician   , and discussed  findings as well as pertinent plan - they recommend: admit  Reevaluation: After the interventions noted above, I reevaluated the patient and found that they have :improved  Complexity of problems addressed: Patients presentation is most consistent with  acute presentation with potential threat to life or bodily function  Disposition: After consideration of the diagnostic results and the patients response to treatment,  I feel that the patent would benefit from admission   .            Final Clinical Impression(s) / ED Diagnoses Final diagnoses:  Hypoglycemia  Concussion with unknown loss of consciousness status, initial encounter  Strain of neck muscle, initial encounter  Hip strain, right, initial encounter  Sprain of right shoulder, unspecified shoulder sprain type, initial encounter    Rx / DC Orders ED Discharge Orders     None         Zadie Rhine, MD 05/26/21 [phone]

## 2021-05-27 DIAGNOSIS — I700 Atherosclerosis of aorta: Secondary | ICD-10-CM | POA: Diagnosis not present

## 2021-05-27 DIAGNOSIS — Z794 Long term (current) use of insulin: Secondary | ICD-10-CM

## 2021-05-27 DIAGNOSIS — E11649 Type 2 diabetes mellitus with hypoglycemia without coma: Secondary | ICD-10-CM

## 2021-05-27 DIAGNOSIS — M47812 Spondylosis without myelopathy or radiculopathy, cervical region: Secondary | ICD-10-CM | POA: Diagnosis present

## 2021-05-27 DIAGNOSIS — I10 Essential (primary) hypertension: Secondary | ICD-10-CM | POA: Diagnosis present

## 2021-05-27 LAB — CBC WITH DIFFERENTIAL/PLATELET
Abs Immature Granulocytes: 0.02 10*3/uL (ref 0.00–0.07)
Basophils Absolute: 0 10*3/uL (ref 0.0–0.1)
Basophils Relative: 0 %
Eosinophils Absolute: 0.1 10*3/uL (ref 0.0–0.5)
Eosinophils Relative: 1 %
HCT: 38.9 % (ref 36.0–46.0)
Hemoglobin: 12.1 g/dL (ref 12.0–15.0)
Immature Granulocytes: 0 %
Lymphocytes Relative: 28 %
Lymphs Abs: 2.4 10*3/uL (ref 0.7–4.0)
MCH: 27.2 pg (ref 26.0–34.0)
MCHC: 31.1 g/dL (ref 30.0–36.0)
MCV: 87.4 fL (ref 80.0–100.0)
Monocytes Absolute: 0.8 10*3/uL (ref 0.1–1.0)
Monocytes Relative: 10 %
Neutro Abs: 5.1 10*3/uL (ref 1.7–7.7)
Neutrophils Relative %: 61 %
Platelets: 193 10*3/uL (ref 150–400)
RBC: 4.45 MIL/uL (ref 3.87–5.11)
RDW: 16.8 % — ABNORMAL HIGH (ref 11.5–15.5)
WBC: 8.4 10*3/uL (ref 4.0–10.5)
nRBC: 0 % (ref 0.0–0.2)

## 2021-05-27 LAB — BASIC METABOLIC PANEL
Anion gap: 10 (ref 5–15)
BUN: 15 mg/dL (ref 8–23)
CO2: 24 mmol/L (ref 22–32)
Calcium: 9.1 mg/dL (ref 8.9–10.3)
Chloride: 105 mmol/L (ref 98–111)
Creatinine, Ser: 1.09 mg/dL — ABNORMAL HIGH (ref 0.44–1.00)
GFR, Estimated: 55 mL/min — ABNORMAL LOW (ref >60)
Glucose, Bld: 155 mg/dL — ABNORMAL HIGH (ref 70–99)
Potassium: 4.2 mmol/L (ref 3.5–5.1)
Sodium: 139 mmol/L (ref 135–145)

## 2021-05-27 LAB — GLUCOSE, CAPILLARY
Glucose-Capillary: 133 mg/dL — ABNORMAL HIGH (ref 70–99)
Glucose-Capillary: 155 mg/dL — ABNORMAL HIGH (ref 70–99)

## 2021-05-27 MED ORDER — INSULIN DEGLUDEC 100 UNIT/ML ~~LOC~~ SOPN: 40.0000 [IU] | PEN_INJECTOR | Freq: Every day | SUBCUTANEOUS | 2 refills | Status: AC

## 2021-05-27 NOTE — Discharge Instructions (Signed)
[name] ? ?You were recently admitted to [facility] for low blood sugar.  ? ?Continue taking your home medications with the following changes ? ?Start taking 40 units of Tresiba ONCE daily ?Stop taking Novolin Insulin ?Start taking trulicity 0.75 once weekly ? ? ?You should seek further medical care if you have further low blood sugars, confusion, dizziness, or any further episodes where you pass out. ? ?We recommend that you see your primary care doctor in about a week to make sure that you continue to improve. We are so glad that you are feeling better. ? ?Sincerely, ?Ilene Qua, MD ? ? ?

## 2021-05-27 NOTE — Care Management Obs Status (Signed)
MEDICARE OBSERVATION STATUS NOTIFICATION ? ? ?Patient Details  ?Name: [name] ?MRN: [id number] ?Date of Birth: March 28, 1950 ? ? ?Medicare Observation Status Notification Given:  Yes ? ? ? ?Carles Collet, RN ?05/27/2021, 12:38 PM ?

## 2021-05-27 NOTE — Discharge Summary (Signed)
Name: [name] MRN: [id number] DOB: 12/08/1950 71 y.o. PCP: Letta Median, MD  Date of Admission: 05/26/2021  5:24 AM Date of Discharge:   05/27/21 Attending Physician: Aldine Contes, MD  Discharge Diagnosis: 1. Syncope 2/2 to Hypoglycemia; T2DM  Discharge Medications: Allergies as of 05/27/2021       Reactions   Phenylmercuric Nitrate Other (See Comments)   BLISTERS        Medication List     STOP taking these medications    NovoLIN 70/30 (70-30) 100 UNIT/ML injection Generic drug: insulin NPH-regular Human       TAKE these medications    amLODipine 5 MG tablet Commonly known as: NORVASC Take 5 mg by mouth daily.   aspirin EC 81 MG tablet Take 81 mg by mouth daily. Swallow whole. What changed: Another medication with the same name was removed. Continue taking this medication, and follow the directions you see here.   atorvastatin 10 MG tablet Commonly known as: LIPITOR Take 10 mg by mouth daily.   buPROPion 150 MG 12 hr tablet Commonly known as: ZYBAN Take 150 mg by mouth 2 (two) times daily.   CALCIUM + VITAMIN D3 PO Take 1 tablet by mouth daily.   carvedilol 25 MG tablet Commonly known as: COREG Take 25 mg by mouth 2 (two) times daily with a meal.   insulin degludec 100 UNIT/ML FlexTouch Pen Commonly known as: TRESIBA Inject 40 Units into the skin daily.   lisinopril 40 MG tablet Commonly known as: ZESTRIL Take 40 mg by mouth daily.   Lyrica 100 MG capsule Generic drug: pregabalin Take 100 mg by mouth 2 (two) times daily.   meloxicam 15 MG tablet Commonly known as: MOBIC Take 15 mg by mouth daily.   Trulicity [id number] Sopn Generic drug: Dulaglutide Inject 0.75 mg into the skin once a week.   vitamin B-12 1000 MCG tablet Commonly known as: CYANOCOBALAMIN Take 1,000 mcg by mouth daily.        Disposition and follow-up:   [name] was discharged from [facility] in Stable condition.   At the hospital follow up visit please address:  1.  T2DM- inquire about further lows, adjust medications as you see fit, try to get CGM  2.  Labs / imaging needed at time of follow-up: none  3.  Pending labs/ test needing follow-up: none  Follow-up Appointments:   Hospital Course by problem list:  1. Syncope 2/2 to Hypoglycemia; T2DM Patient presented after a syncopal episode at home where CBG was found to be 34. Reportedly over the last 2-3 weeks she had been experiencing multiple episodes of low blood sugar. She uses Novolin 70/30 at 11 Am and at 7:30 PM taking 60 and 55 units. These episodes of hypoglycemia are likely related to insulin stacking and inconsistent meal intake. She was monitored in the hospital and put on sliding scale and her sugars normalized. She will be discharged with tresiba 40 units daily in addition to her metformin and the trulicity that she is looking to start  Discharge subjective: Patient is feeling much better today. Says that she did not know that when she got hot and sweaty it might be because of her diabetes. She has a primary care doctor that she follows with. She says that she has been on the same insulin for about thirty years.   Discharge Exam:   BP (!) 143/87 (BP Location: Left Arm)    Pulse 87  Temp 98.4 F (36.9 C) (Oral)    Resp 18    Ht 5\' 3"  (1.6 m)    Wt 87.1 kg    SpO2 97%    BMI 34.01 kg/m  Discharge exam:  Gen: elderly woman resting comfortably in bed CV:RRR no m/r/g, no LEE Resp: normal WOB on room air, CTAB Abd: soft, non-tender, normal bowel sounds Neuro:alert, oriented, and answering questions appropriately Skin:warm and dry; healing abrasions on face Psych: normal affect  Pertinent Labs, Studies, and Procedures:   CT Cervical Spine Wo Contrast  Result Date: 05/26/2021 CLINICAL DATA:  Code stroke. 71 year old female with right side weakness. Fall. EXAM: CT CERVICAL SPINE WITHOUT CONTRAST TECHNIQUE: Multidetector CT imaging of  the cervical spine was performed without intravenous contrast. Multiplanar CT image reconstructions were also generated. RADIATION DOSE REDUCTION: This exam was performed according to the departmental dose-optimization program which includes automated exposure control, adjustment of the mA and/or kV according to patient size and/or use of iterative reconstruction technique. COMPARISON:  Head CT today reported separately. CTA head and neck 05/11/2021. FINDINGS: Alignment: Straightening of cervical lordosis appears stable from last month. Cervicothoracic junction alignment is within normal limits. Bilateral posterior element alignment is within normal limits. Skull base and vertebrae: Visualized skull base is intact. No atlanto-occipital dissociation. C1 and C2 appear stable and intact. Generalized osteopenia. Degenerative endplate sclerosis. No acute osseous abnormality identified. Soft tissues and spinal canal: No prevertebral fluid or swelling. No visible canal hematoma. Retropharyngeal carotids with calcified atherosclerosis greater on the right. 10 mm hypodense right thyroid nodule. Not clinically significant; no follow-up imaging recommended (ref: J Am Coll Radiol. 2015 Feb;12(2): 143-50). Disc levels: Widespread severe disc and endplate degeneration in the cervical spine, sparing only C2-C3. Widespread associated spinal stenosis, mild-to-moderate. Upper chest: Upper thoracic disc and endplate degeneration. Grossly intact visible upper thoracic levels. Negative lung apices. Calcified aortic atherosclerosis. IMPRESSION: 1. No acute traumatic injury identified in the cervical spine. 2. 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 3. Aortic Atherosclerosis (ICD10-I70.0). Electronically Signed   By: Genevie Ann M.D.   On: 05/26/2021 05:51   DG Chest Port 1 View  Result Date: 05/26/2021 CLINICAL DATA:  71 year old female with chest and right shoulder pain. Fall. EXAM: PORTABLE CHEST  1 VIEW COMPARISON:  Chest radiographs 03/20/2009. FINDINGS: Portable AP semi upright view at 0602 hours. Cardiomegaly. Lung volumes are at the upper limits of normal. Other mediastinal contours are within normal limits. Visualized tracheal air column is within normal limits. Allowing for portable technique the lungs are clear. No pneumothorax or pleural effusion. Right shoulder reported separately. Diffuse thoracic disc and endplate degeneration. IMPRESSION: Cardiomegaly and borderline pulmonary hyperinflation. No acute cardiopulmonary abnormality. Electronically Signed   By: Genevie Ann M.D.   On: 05/26/2021 06:41   DG Shoulder Right Port  Result Date: 05/26/2021 CLINICAL DATA:  71 year old female with chest and right shoulder pain. Fall. EXAM: RIGHT SHOULDER - 1 VIEW COMPARISON:  Right shoulder 04/18/2013. FINDINGS: 3 portable views. No glenohumeral joint dislocation. Nondisplaced humeral head fracture suspected, with impacted appearance of trabeculae near the confluence of the humeral head and neck. Right clavicle and scapula appear intact with degeneration. And since 2015 near complete effacement of the subacromial space compatible with severe rotator cuff degeneration. Visible right ribs and chest appear negative. IMPRESSION: 1. Severe right shoulder degeneration since a 2015 comparison which might explain the altered appearance of the humeral head since that time, but cannot exclude an impacted nondisplaced right humeral head fracture.  Noncontrast Shoulder CT or MRI should be confirmatory. 2. No other fracture identified, no glenohumeral dislocation. Electronically Signed   By: Genevie Ann M.D.   On: 05/26/2021 06:39   DG Hip Port Unilat W or Wo Pelvis 1 View Right  Result Date: 05/26/2021 CLINICAL DATA:  71 year old female with chest and right shoulder pain. Fall. EXAM: DG HIP (WITH OR WITHOUT PELVIS) 1V PORT RIGHT COMPARISON:  Right hip series 11/23/2014. FINDINGS: Portable AP supine views at 0607 hours.  Femoral heads are normally located. Pelvis appears stable and intact. Grossly intact proximal left femur. On single AP view of the right hip right proximal femur appears stable and intact. Negative lower abdominal and pelvic visceral contours. IMPRESSION: No acute fracture or dislocation identified about the right hip or pelvis. Electronically Signed   By: Genevie Ann M.D.   On: 05/26/2021 06:42   CT HEAD CODE STROKE WO CONTRAST  Result Date: 05/26/2021 CLINICAL DATA:  Code stroke. 71 year old female with right side weakness. Fall. EXAM: CT HEAD WITHOUT CONTRAST TECHNIQUE: Contiguous axial images were obtained from the base of the skull through the vertex without intravenous contrast. RADIATION DOSE REDUCTION: This exam was performed according to the departmental dose-optimization program which includes automated exposure control, adjustment of the mA and/or kV according to patient size and/or use of iterative reconstruction technique. COMPARISON:  Brain MRI 03/21/2009. Head CT 05/11/2021. FINDINGS: Brain: Stable cerebral volume. 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 No midline shift, mass effect, or evidence of intracranial mass lesion. No ventriculomegaly. Stable gray-white matter differentiation throughout the brain. Mild patchy bilateral white matter hypodensity. No cortically based acute infarct identified. No acute intracranial hemorrhage identified. Vascular: Calcified atherosclerosis at the skull base. No suspicious intracranial vascular hyperdensity. Skull: Osteopenia. Stable, intact. Sinuses/Orbits: Visualized paranasal sinuses and mastoids are stable and well aerated. Other: Visualized orbits and scalp soft tissues are within normal limits. ASPECTS [facility] Stroke Program Early CT Score) Total score (0-10 with 10 being normal): 10 IMPRESSION: 1. No acute cortically based infarct or acute intracranial hemorrhage identified. ASPECTS 10. 2. Stable non contrast CT appearance of chronic small  vessel disease. 3. These results were communicated to Dr. Corine Shelter at 5:43 am on 05/26/2021 by text page via the [facility] messaging system. Electronically Signed   By: Genevie Ann M.D.   On: 05/26/2021 05:48    CBC Latest Ref Rng & Units 05/27/2021 05/26/2021 05/26/2021  WBC 4.0 - 10.5 K/uL 8.4 - 11.2(H)  Hemoglobin 12.0 - 15.0 g/dL 12.1 15.0 13.3  Hematocrit 36.0 - 46.0 % 38.9 44.0 43.6  Platelets 150 - 400 K/uL 193 - 203    BMP Latest Ref Rng & Units 05/27/2021 05/26/2021 05/26/2021  Glucose 70 - 99 mg/dL 155(H) 81 81  BUN 8 - 23 mg/dL 15 17 17   Creatinine 0.44 - 1.00 mg/dL 1.09(H) 1.10(H) 1.08(H)  Sodium 135 - 145 mmol/L 139 141 137  Potassium 3.5 - 5.1 mmol/L 4.2 4.3 4.6  Chloride 98 - 111 mmol/L 105 105 104  CO2 22 - 32 mmol/L 24 - 17(L)  Calcium 8.9 - 10.3 mg/dL 9.1 - 9.0    Discharge Instructions: Discharge Instructions     Call MD for:  difficulty breathing, headache or visual disturbances   Complete by: As directed    Call MD for:  persistant dizziness or light-headedness   Complete by: As directed    Call MD for:  persistant nausea and vomiting   Complete by: As directed    Diet - low  sodium heart healthy   Complete by: As directed    Increase activity slowly   Complete by: As directed        Signed: Scarlett Presto, MD 05/27/2021, 11:12 AM   Pager: [phone]

## 2021-05-27 NOTE — Progress Notes (Signed)
DISCHARGE NOTE HOME ?[name] to be discharged Home per MD order. Discussed prescriptions and follow up appointments with the patient. Prescriptions given to patient; medication list explained in detail. Patient verbalized understanding. ? ?Skin clean, dry and intact without evidence of skin break down, no evidence of skin tears noted. IV catheter discontinued intact. Site without signs and symptoms of complications. Dressing and pressure applied. Pt denies pain at the site currently. No complaints noted. ? ?Patient free of lines, drains, and wounds.  ? ?An After Visit Summary (AVS) was printed and given to the patient. ?Patient escorted via wheelchair, and discharged home via private auto. ? ?Apostolos Blagg S Sherran Margolis, RN   ?

## 2021-05-28 ENCOUNTER — Telehealth: Payer: Self-pay | Admitting: Internal Medicine

## 2021-05-28 NOTE — Telephone Encounter (Signed)
Pt Inpat 05/26/2021-05/27/2021  ?Discharge Summary from [facility], [place] ? ? ?Pharmacye requesing a call back ? ?insulin degludec (TRESIBA) 100 UNIT/ML FlexTouch Pen ? ?[facility] - [place], [place] - [address] (Ph: [phone]) ?

## 2021-05-28 NOTE — Telephone Encounter (Signed)
Returned call to [facility] at [facility]. States [name] comes in box with 5 pens (15 mL). They are unable to break box. VO given for 15 mL with no refills. ?

## 2021-06-07 ENCOUNTER — Encounter (HOSPITAL_COMMUNITY): Payer: Self-pay | Admitting: Radiology

## 2021-06-21 ENCOUNTER — Encounter (HOSPITAL_COMMUNITY): Payer: Self-pay | Admitting: Radiology

## 2021-06-25 ENCOUNTER — Ambulatory Visit (HOSPITAL_COMMUNITY): Admission: RE | Admit: 2021-06-25 | Payer: Medicare HMO | Source: Ambulatory Visit

## 2021-06-26 ENCOUNTER — Ambulatory Visit (HOSPITAL_COMMUNITY)
Admission: RE | Admit: 2021-06-26 | Discharge: 2021-06-26 | Disposition: A | Discharge status: Home / Self Care | Payer: Medicare HMO | Source: Ambulatory Visit | Attending: Neuroradiology | Admitting: Neuroradiology

## 2021-06-26 DIAGNOSIS — I671 Cerebral aneurysm, nonruptured: Secondary | ICD-10-CM

## 2021-06-26 NOTE — Consult Note (Signed)
? ?Chief Complaint: Patient was seen in consultation today for brain aneurysms. ? ?Referring Physician(s): Marisue Humble. ? ?Supervising Physician: Baldemar Lenis ? ?Patient Status: [facility] - Out-pt ? ?History of Present Illness: ?[name] is a 71 y.o. female with a past medical history significant for COPD, type 2 DM, HTN, osteoarthritis and gout.  She presented to the emergency on 05/26/2021 with hypoglycemic encephalopathy and fall.  She was worked up as a code stroke and underwent a CT angiogram of the head and neck that showed incidental bilateral MCA bifurcation aneurysms. Since she was discharged and her diabetes medications were modified, she no longer has experienced episodes of hypoglycemia. ? ? Her family history is significant for hemorrhagic stroke in her paternal uncle, although it is unclear if aneurysmal.  No first degree relatives with known brain aneurysm.  She is a smoker (13 pack year).  ? ?CT angiogram showed a 5 mm right MCA bifurcation aneurysm and a 4 mm left MCA bifurcation aneurysm.  Additionally, review of CT angiogram shows increased tortuosity of the major cervical arteries which can be seen in the setting of chronic hypertension.  ? ?Past Medical History:  ?Diagnosis Date  ? Diabetes mellitus without complication (HCC)   ? ? ?No past surgical history on file. ? ?Allergies: ?Phenylmercuric nitrate ? ?Medications: ?Prior to Admission medications   ?Medication Sig Start Date End Date Taking? Authorizing Provider  ?amLODipine (NORVASC) 5 MG tablet Take 5 mg by mouth daily.    [provider]  ?aspirin EC 81 MG tablet Take 81 mg by mouth daily. Swallow whole.    [provider]  ?atorvastatin (LIPITOR) 10 MG tablet Take 10 mg by mouth daily.    [provider]  ?buPROPion (ZYBAN) 150 MG 12 hr tablet Take 150 mg by mouth 2 (two) times daily. 05/07/21   [provider]  ?Calcium Carb-Cholecalciferol (CALCIUM + VITAMIN D3 PO) Take 1  tablet by mouth daily.    [provider]  ?carvedilol (COREG) 25 MG tablet Take 25 mg by mouth 2 (two) times daily with a meal.    [provider]  ?insulin degludec (TRESIBA) 100 UNIT/ML FlexTouch Pen Inject 40 Units into the skin daily. 05/27/21   Demaio, Alexa, MD  ?lisinopril (ZESTRIL) 40 MG tablet Take 40 mg by mouth daily.    [provider]  ?LYRICA 100 MG capsule Take 100 mg by mouth 2 (two) times daily. 05/10/21   [provider]  ?meloxicam (MOBIC) 15 MG tablet Take 15 mg by mouth daily. 05/07/21   [provider]  ?TRULICITY 0.75 MG/0.5ML SOPN Inject 0.75 mg into the skin once a week. 05/10/21   [provider]  ?vitamin B-12 (CYANOCOBALAMIN) 1000 MCG tablet Take 1,000 mcg by mouth daily.    [provider]  ?  ? ?No family history on file. ? ?Social History  ? ?Socioeconomic History  ? Marital status: Married  ?  Spouse name: Not on file  ? Number of children: Not on file  ? Years of education: Not on file  ? Highest education level: Not on file  ?Occupational History  ? Not on file  ?Tobacco Use  ? Smoking status: Never  ?  Passive exposure: Never  ? Smokeless tobacco: Never  ?Substance and Sexual Activity  ? Alcohol use: Yes  ? Drug use: Never  ? Sexual activity: Not on file  ?Other Topics Concern  ? Not on file  ?Social History Narrative  ? Not  on file  ? ?Social Determinants of Health  ? ?Financial Resource Strain: Not on file  ?Food Insecurity: Not on file  ?Transportation Needs: Not on file  ?Physical Activity: Not on file  ?Stress: Not on file  ?Social Connections: Not on file  ? ? ? ?Review of Systems: A 12 point ROS discussed and pertinent positives are indicated in the HPI above.  All other systems are negative. ? ?Review of Systems ? ?Vital Signs: ?There were no vitals taken for this visit. ? ?Physical Exam ?Constitutional:   ?   Appearance: She is obese.  ?HENT:  ?   Head: Normocephalic.  ?   Comments: Healing abrasion in forehead. ?    Mouth/Throat:  ?   Mouth: Mucous membranes are moist.  ?Eyes:  ?   Extraocular Movements: Extraocular movements intact.  ?   Pupils: Pupils are equal, round, and reactive to light.  ?Neurological:  ?   Mental Status: She is alert and oriented to person, place, and time.  ?   Cranial Nerves: Cranial nerves 2-12 are intact.  ?   Sensory: Sensation is intact.  ?   Motor: Weakness present.  ?   Comments: LLE weakness related to knee osteoarthritis and prior surgery.  ? ? ? ?  ? ? ?Imaging: ?CTA head: ?  ?1. No intracranial large vessel occlusion. ?2. Apparent severe atherosclerotic stenosis of the distal ?cavernous/paraclinoid right ICA. ?3. Atherosclerotic plaque within the intracranial left ICA with no ?more than mild stenosis. ?4. 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 4 x 3 mm saccular aneurysm arising from the left MCA ?bifurcation. Neuro-interventional consultation is recommended. ?  ?  ?Electronically Signed ?  By: Jackey LogeKyle  Golden D.O. ?  On: 05/11/2021 11:40 ? ?Labs: ? ?CBC: ?Recent Labs  ?  05/11/21 ?1046 05/26/21 ?[id number] 05/26/21 ?[id number] 05/27/21 ?0143  ?WBC 9.7 11.2*  --  8.4  ?HGB 12.4 13.3 15.0 12.1  ?HCT 42.4 43.6 44.0 38.9  ?PLT 183 203  --  193  ? ? ?COAGS: ?Recent Labs  ?  05/11/21 ?1046 05/26/21 ?[id number]  ?INR 1.0 1.0  ?APTT 28 27  ? ? ?BMP: ?Recent Labs  ?  05/11/21 ?1046 05/26/21 ?[id number] 05/26/21 ?[id number] 05/27/21 ?0143  ?NA 139 137 141 139  ?K 4.8 4.6 4.3 4.2  ?CL 111 104 105 105  ?CO2 21* 17*  --  24  ?GLUCOSE 83 81 81 155*  ?BUN 17 17 17 15   ?CALCIUM 8.3* 9.0  --  9.1  ?CREATININE 1.32* 1.08* 1.10* 1.09*  ?GFRNONAA 44* 56*  --  55*  ? ? ?LIVER FUNCTION TESTS: ?Recent Labs  ?  05/11/21 ?1046 05/26/21 ?[id number]  ?BILITOT 1.0 0.2*  ?AST 21 20  ?ALT 15 18  ?ALKPHOS 105 120  ?PROT 6.5 6.7  ?ALBUMIN 3.2* 3.4*  ? ? ?TUMOR MARKERS: ?No results for input(s): AFPTM, CEA, CA199, CHROMGRNA in the last 8760 hours. ? ?Assessment and Plan: ? 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  I  explained to her the natural history of brain aneurysms as well as the signs and symptoms associated with a brain aneurysm rupture.  I proposed that we obtain a diagnostic cerebral angiogram to better evaluate for aneurysms size, shape and morphology, as well as their relationship was the parent artery.  She was also encouraged to quit smoking.  All questions were answered to her satisfaction.  [name] would like to proceed with a diagnostic cerebral angiogram under moderate sedation.  We will reach out to her to schedule the procedure.  ? ?Thank you for this interesting consult.  I greatly enjoyed meeting [name] and look forward to participating in her care.  A copy of this report was sent to the requesting provider on this date. ? ?Electronically Signed: ?Baldemar Lenis, MD ?06/26/2021, 3:21 PM ? ? ?I spent a total of  40 Minutes   in face to face in clinical consultation, greater than 50% of which was counseling/coordinating care for brain aneurysms.  ?

## 2021-06-27 ENCOUNTER — Other Ambulatory Visit (HOSPITAL_COMMUNITY): Payer: Self-pay | Admitting: Neuroradiology

## 2021-06-27 DIAGNOSIS — I671 Cerebral aneurysm, nonruptured: Secondary | ICD-10-CM

## 2021-07-04 ENCOUNTER — Other Ambulatory Visit: Payer: Self-pay | Admitting: Student

## 2021-07-04 ENCOUNTER — Other Ambulatory Visit: Payer: Self-pay | Admitting: Radiology

## 2021-07-05 ENCOUNTER — Other Ambulatory Visit (HOSPITAL_COMMUNITY): Payer: Self-pay | Admitting: Neuroradiology

## 2021-07-05 ENCOUNTER — Telehealth (HOSPITAL_COMMUNITY): Payer: Self-pay

## 2021-07-05 ENCOUNTER — Other Ambulatory Visit: Payer: Self-pay

## 2021-07-05 ENCOUNTER — Ambulatory Visit (HOSPITAL_COMMUNITY)
Admission: RE | Admit: 2021-07-05 | Discharge: 2021-07-05 | Disposition: A | Discharge status: Home / Self Care | Payer: Medicare HMO | Source: Ambulatory Visit | Attending: Neuroradiology | Admitting: Neuroradiology

## 2021-07-05 ENCOUNTER — Encounter (HOSPITAL_COMMUNITY): Payer: Self-pay

## 2021-07-05 DIAGNOSIS — Z794 Long term (current) use of insulin: Secondary | ICD-10-CM | POA: Insufficient documentation

## 2021-07-05 DIAGNOSIS — I10 Essential (primary) hypertension: Secondary | ICD-10-CM | POA: Diagnosis not present

## 2021-07-05 DIAGNOSIS — I671 Cerebral aneurysm, nonruptured: Secondary | ICD-10-CM

## 2021-07-05 DIAGNOSIS — J449 Chronic obstructive pulmonary disease, unspecified: Secondary | ICD-10-CM | POA: Insufficient documentation

## 2021-07-05 DIAGNOSIS — E119 Type 2 diabetes mellitus without complications: Secondary | ICD-10-CM | POA: Insufficient documentation

## 2021-07-05 DIAGNOSIS — Z7985 Long-term (current) use of injectable non-insulin antidiabetic drugs: Secondary | ICD-10-CM | POA: Diagnosis not present

## 2021-07-05 HISTORY — PX: IR ANGIO VERTEBRAL SEL VERTEBRAL UNI R MOD SED: IMG5368

## 2021-07-05 HISTORY — PX: IR ANGIO INTRA EXTRACRAN SEL INTERNAL CAROTID BILAT MOD SED: IMG5363

## 2021-07-05 HISTORY — PX: IR US GUIDE VASC ACCESS RIGHT: IMG2390

## 2021-07-05 LAB — BASIC METABOLIC PANEL
Anion gap: 7 (ref 5–15)
BUN: 22 mg/dL (ref 8–23)
CO2: 24 mmol/L (ref 22–32)
Calcium: 8.6 mg/dL — ABNORMAL LOW (ref 8.9–10.3)
Chloride: 110 mmol/L (ref 98–111)
Creatinine, Ser: 1.33 mg/dL — ABNORMAL HIGH (ref 0.44–1.00)
GFR, Estimated: 43 mL/min — ABNORMAL LOW (ref >60)
Glucose, Bld: 71 mg/dL (ref 70–99)
Potassium: 4.8 mmol/L (ref 3.5–5.1)
Sodium: 141 mmol/L (ref 135–145)

## 2021-07-05 LAB — CBC
HCT: 38.4 % (ref 36.0–46.0)
Hemoglobin: 11.8 g/dL — ABNORMAL LOW (ref 12.0–15.0)
MCH: 27.7 pg (ref 26.0–34.0)
MCHC: 30.7 g/dL (ref 30.0–36.0)
MCV: 90.1 fL (ref 80.0–100.0)
Platelets: 214 10*3/uL (ref 150–400)
RBC: 4.26 MIL/uL (ref 3.87–5.11)
RDW: 16.4 % — ABNORMAL HIGH (ref 11.5–15.5)
WBC: 7.5 10*3/uL (ref 4.0–10.5)
nRBC: 0 % (ref 0.0–0.2)

## 2021-07-05 LAB — GLUCOSE, CAPILLARY
Glucose-Capillary: 75 mg/dL (ref 70–99)
Glucose-Capillary: 50 mg/dL — ABNORMAL LOW (ref 70–99)
Glucose-Capillary: 98 mg/dL (ref 70–99)
Glucose-Capillary: 164 mg/dL — ABNORMAL HIGH (ref 70–99)
Glucose-Capillary: 53 mg/dL — ABNORMAL LOW (ref 70–99)
Glucose-Capillary: 47 mg/dL — ABNORMAL LOW (ref 70–99)
Glucose-Capillary: 73 mg/dL (ref 70–99)
Glucose-Capillary: 139 mg/dL — ABNORMAL HIGH (ref 70–99)

## 2021-07-05 LAB — PROTIME-INR
INR: 1 (ref 0.8–1.2)
Prothrombin Time: 12.6 seconds (ref 11.4–15.2)

## 2021-07-05 MED ORDER — FENTANYL CITRATE (PF) 100 MCG/2ML IJ SOLN
INTRAMUSCULAR | Status: AC | PRN
Start: 2021-07-05 — End: 2021-07-05
  Administered 2021-07-05 (×3): 12.5 ug via INTRAVENOUS

## 2021-07-05 MED ORDER — NITROGLYCERIN 1 MG/10 ML FOR IR/CATH LAB
INTRA_ARTERIAL | Status: AC
  Filled 2021-07-05: qty 10

## 2021-07-05 MED ORDER — FENTANYL CITRATE (PF) 100 MCG/2ML IJ SOLN
INTRAMUSCULAR | Status: AC
Start: 2021-07-05 — End: 2021-07-05
  Filled 2021-07-05: qty 2

## 2021-07-05 MED ORDER — MIDAZOLAM HCL 2 MG/2ML IJ SOLN
INTRAMUSCULAR | Status: AC
  Filled 2021-07-05: qty 2

## 2021-07-05 MED ORDER — DEXTROSE 50 % IV SOLN
25.0000 mL | Freq: Once | INTRAVENOUS | Status: AC
  Administered 2021-07-05: 25 mL via INTRAVENOUS

## 2021-07-05 MED ORDER — MIDAZOLAM HCL 2 MG/2ML IJ SOLN
INTRAMUSCULAR | Status: AC | PRN
  Administered 2021-07-05: 1 mg via INTRAVENOUS

## 2021-07-05 MED ORDER — HEPARIN SODIUM (PORCINE) 1000 UNIT/ML IJ SOLN
INTRAMUSCULAR | Status: AC
  Filled 2021-07-05: qty 10

## 2021-07-05 MED ORDER — LIDOCAINE HCL 1 % IJ SOLN
INTRAMUSCULAR | Status: AC | PRN
  Administered 2021-07-05: 5 mL via INTRADERMAL

## 2021-07-05 MED ORDER — DEXTROSE 50 % IV SOLN: 25.0000 mL | Freq: Once | INTRAVENOUS | Status: AC

## 2021-07-05 MED ORDER — HEPARIN SODIUM (PORCINE) 1000 UNIT/ML IJ SOLN
INTRAMUSCULAR | Status: AC
Start: 2021-07-05 — End: 2021-07-05
  Filled 2021-07-05: qty 10

## 2021-07-05 MED ORDER — VERAPAMIL HCL 2.5 MG/ML IV SOLN
INTRAVENOUS | Status: AC
  Filled 2021-07-05: qty 2

## 2021-07-05 MED ORDER — LIDOCAINE HCL 1 % IJ SOLN
INTRAMUSCULAR | Status: AC
Start: 2021-07-05 — End: 2021-07-05
  Filled 2021-07-05: qty 20

## 2021-07-05 MED ORDER — SODIUM CHLORIDE 0.9 % IV SOLN: Freq: Once | INTRAVENOUS | Status: DC

## 2021-07-05 MED ORDER — DEXTROSE 50 % IV SOLN
INTRAVENOUS | Status: AC
  Filled 2021-07-05: qty 50

## 2021-07-05 MED ORDER — DEXTROSE 50 % IV SOLN: 25.0000 g | INTRAVENOUS | Status: DC

## 2021-07-05 MED ORDER — DEXTROSE 50 % IV SOLN
INTRAVENOUS | Status: AC
  Administered 2021-07-05: 25 mL via INTRAVENOUS
  Filled 2021-07-05: qty 50

## 2021-07-05 NOTE — Progress Notes (Signed)
Inpatient Diabetes Program Recommendations ? ?AACE/ADA: New Consensus Statement on Inpatient Glycemic Control (2015) ? ?Target Ranges:  Prepandial:   less than 140 mg/dL ?     Peak postprandial:   less than 180 mg/dL (1-2 hours) ?     Critically ill patients:  140 - 180 mg/dL  ? ?Lab Results  ?Component Value Date  ? GLUCAP 98 07/05/2021  ? HGBA1C 7.7 (H) 05/26/2021  ? ? ?Review of Glycemic Control ? ?Diabetes history: DM2 ?Outpatient Diabetes medications: Tresiba 40 QD, metformin 2000 mg BID, Trulicity 0.75 (has not started this) ? ?Had hypoglycemia - 50 mg/dL, this am, likely from taking full dose of Tresiba prior to procedure this am. ? ? ?Inpatient Diabetes Program Recommendations:   ? ?Spoke with pt on phone regarding her diabetes control. Discussed hypoglycemia s/s and treatment. Pt states she is closely followed by PCP for diabetes management. Not having as many lows at home since eating snack at night. Does not take any rapid-acting insulin. Tries to eat healthy most of the time. Checks blood sugars twice a day - before breakfast and 2H after dinner meal. Answered questions. Instructed to take blood sugar log to MD for review. May need to decrease Tresiba and add rapid-acting insulin for meal coverage. ? ?Thank you. ?Ailene Ards, RD, LDN, CDE ?Inpatient Diabetes Coordinator ?[phone]  ? ? ? ? ?

## 2021-07-05 NOTE — Telephone Encounter (Signed)
Called to schedule consult, no answer, memory full. Will try back later. AW ?

## 2021-07-05 NOTE — Sedation Documentation (Addendum)
Right groin sheath removed,exoseal closure device deployed at  ?

## 2021-07-05 NOTE — H&P (Addendum)
? ?Chief Complaint: ?Patient was seen in consultation today for brain aneurysms ? ?Supervising Physician: [name] ? ?Patient Status: [facility] - Out-pt ? ?History of Present Illness: ?[name] is a 71 y.o. female with past medical history of COPD, DM, HTN who recently underwent work-up for episodes of syncope.  This was ultimately attributed to her insulin and adjustments were made accordingly, however CT Angio Head and Neck incidentally showed bilateral MCA bifurcation aneurysms.  She was referred to Dr. Karenann [name] for monitoring and treatment.   She recently met with Dr. Karenann [name] in consultation 06/26/21 to discuss her aneuryms and elected to proceed with diagnostic angiogram.  ? ?[name] presents today in her usual state of health.  She has been NPO.  She did take her insulin this AM CBGs have been running 60-70s today; asymptomatic. She understands the goals of the procedure and is agreeable to proceed.  She denies any headache, dizziness, shortness of breath, cough, abdominal pain, trouble walking, weakness.  ? ?Past Medical History:  ?Diagnosis Date  ? Diabetes mellitus without complication ([place])   ? ? ?History reviewed. No pertinent surgical history. ? ?Allergies: ?Phenylmercuric nitrate ? ?Medications: ?Prior to Admission medications   ?Medication Sig Start Date End Date Taking? Authorizing Provider  ?amLODipine (NORVASC) 5 MG tablet Take 5 mg by mouth daily.   Yes [provider]  ?aspirin EC 81 MG tablet Take 81 mg by mouth daily. Swallow whole.   Yes [provider]  ?atorvastatin (LIPITOR) 10 MG tablet Take 10 mg by mouth daily.   Yes [provider]  ?buPROPion (ZYBAN) 150 MG 12 hr tablet Take 150 mg by mouth 2 (two) times daily. 05/07/21  Yes [provider]  ?carvedilol (COREG) 25 MG tablet Take 25 mg by mouth 2 (two) times daily with a meal.   Yes [provider]  ?insulin degludec (TRESIBA) 100 UNIT/ML  FlexTouch Pen Inject 40 Units into the skin daily. 05/27/21  Yes Demaio, Alexa, MD  ?lisinopril (ZESTRIL) 40 MG tablet Take 40 mg by mouth daily.   Yes [provider]  ?meloxicam (MOBIC) 15 MG tablet Take 15 mg by mouth daily. 05/07/21  Yes [provider]  ?vitamin B-12 (CYANOCOBALAMIN) 1000 MCG tablet Take 1,000 mcg by mouth daily.   Yes [provider]  ?Calcium Carb-Cholecalciferol (CALCIUM + VITAMIN D3 PO) Take 1 tablet by mouth daily.    [provider]  ?LYRICA 100 MG capsule Take 100 mg by mouth 2 (two) times daily. 05/10/21   [provider]  ?TRULICITY 6.16 WV/3.7TG SOPN Inject 0.75 mg into the skin once a week. 05/10/21   [provider]  ?  ? ?History reviewed. No pertinent family history. ? ?Social History  ? ?Socioeconomic History  ? Marital status: Married  ?  Spouse name: Not on file  ? Number of children: Not on file  ? Years of education: Not on file  ? Highest education level: Not on file  ?Occupational History  ? Not on file  ?Tobacco Use  ? Smoking status: Never  ?  Passive exposure: Never  ? Smokeless tobacco: Never  ?Substance and Sexual Activity  ? Alcohol use: Yes  ? Drug use: Never  ? Sexual activity: Not on file  ?Other Topics Concern  ? Not on file  ?Social History Narrative  ? Not on file  ? ?Social Determinants of Health  ? ?Financial Resource Strain: Not on file  ?Food Insecurity: Not  on file  ?Transportation Needs: Not on file  ?Physical Activity: Not on file  ?Stress: Not on file  ?Social Connections: Not on file  ? ? ? ?Review of Systems: A 12 point ROS discussed and pertinent positives are indicated in the HPI above.  All other systems are negative. ? ?Review of Systems  ?Constitutional:  Negative for fatigue and fever.  ?Respiratory:  Negative for cough and shortness of breath.   ?Cardiovascular:  Negative for chest pain.  ?Gastrointestinal:  Negative for abdominal pain, diarrhea, nausea and vomiting.  ?Musculoskeletal:  Negative  for back pain.  ?Neurological:  Negative for dizziness, weakness, light-headedness and headaches.  ?Psychiatric/Behavioral:  Negative for behavioral problems and confusion.   ? ?Vital Signs: ?BP 138/76   Pulse 80   Temp 98.6 ?F (37 ?C) (Oral)   Resp 18   Ht '5\' 3"'  (1.6 m)   Wt 197 lb (89.4 kg)   SpO2 96%   BMI 34.90 kg/m?  ? ?Physical Exam ?Vitals and nursing note reviewed.  ?Constitutional:   ?   General: She is not in acute distress. ?   Appearance: Normal appearance. She is not ill-appearing.  ?HENT:  ?   Mouth/Throat:  ?   Mouth: Mucous membranes are moist.  ?   Pharynx: Oropharynx is clear.  ?Cardiovascular:  ?   Rate and Rhythm: Normal rate and regular rhythm.  ?Pulmonary:  ?   Effort: Pulmonary effort is normal. No respiratory distress.  ?Abdominal:  ?   General: Abdomen is flat.  ?   Palpations: Abdomen is soft.  ?Skin: ?   General: Skin is warm and dry.  ?Neurological:  ?   General: No focal deficit present.  ?   Mental Status: She is alert and oriented to person, place, and time. Mental status is at baseline.  ?Psychiatric:     ?   Mood and Affect: Mood normal.     ?   Behavior: Behavior normal.     ?   Thought Content: Thought content normal.     ?   Judgment: Judgment normal.  ? ? ? ?MD Evaluation ?Airway: WNL ?Heart: WNL ?Abdomen: WNL ?Chest/ Lungs: WNL ?ASA  Classification: 3 ?Mallampati/Airway Score: Two ? ? ?Imaging: ?No results found. ? ?Labs: ? ?CBC: ?Recent Labs  ?  05/11/21 ?1046 05/26/21 ?9833 05/26/21 ?8250 05/27/21 ?0143  ?WBC 9.7 11.2*  --  8.4  ?HGB 12.4 13.3 15.0 12.1  ?HCT 42.4 43.6 44.0 38.9  ?PLT 183 203  --  193  ? ? ?COAGS: ?Recent Labs  ?  05/11/21 ?1046 05/26/21 ?5397  ?INR 1.0 1.0  ?APTT 28 27  ? ? ?BMP: ?Recent Labs  ?  05/11/21 ?1046 05/26/21 ?6734 05/26/21 ?1937 05/27/21 ?0143  ?NA 139 137 141 139  ?K 4.8 4.6 4.3 4.2  ?CL 111 104 105 105  ?CO2 21* 17*  --  24  ?GLUCOSE 83 81 81 155*  ?BUN '17 17 17 15  ' ?CALCIUM 8.3* 9.0  --  9.1  ?CREATININE 1.32* 1.08* 1.10* 1.09*   ?GFRNONAA 44* 56*  --  55*  ? ? ?LIVER FUNCTION TESTS: ?Recent Labs  ?  05/11/21 ?1046 05/26/21 ?9024  ?BILITOT 1.0 0.2*  ?AST 21 20  ?ALT 15 18  ?ALKPHOS 105 120  ?PROT 6.5 6.7  ?ALBUMIN 3.2* 3.4*  ? ? ?TUMOR MARKERS: ?No results for input(s): AFPTM, CEA, CA199, CHROMGRNA in the last 8760 hours. ? ?Assessment and Plan: ?Bifurcation MCA aneurysm ?Patient with past medical history of  DM, COPD presents with complaint of bifurcation MCA aneurysms.  ?Case reviewed by Dr. Karenann [name] who approves patient for procedure and has met with the patient in consultation.  ?Patient presents today in their usual state of health.  ?She has been NPO and is not currently on blood thinners.  ?Will monitor CBGs this AM prior to procedure.  ? ?Thank you for this interesting consult.  I greatly enjoyed meeting [name] and look forward to participating in their care.  A copy of this report was sent to the requesting provider on this date. ? ?Electronically Signed: ?Docia Barrier, PA ?07/05/2021, 8:34 AM ? ? ?I spent a total of  30 Minutes   in face to face in clinical consultation, greater than 50% of which was counseling/coordinating care for bifurcation MCA aneurysm. ? ?

## 2021-07-05 NOTE — Progress Notes (Signed)
Patient was given 25 ml of Dextrose. Patient BS was 50. Will recheck in 15 min ?

## 2021-07-05 NOTE — Progress Notes (Signed)
Patient [name] is now up to 98. Will continue to monitor. ?

## 2021-07-05 NOTE — Progress Notes (Signed)
Blood sugar 47, pt alert and oriented and states she feels fine.  Taking po fluids and food, will recheck in 30 minutes and treat accordingly. ?

## 2021-07-10 ENCOUNTER — Ambulatory Visit (HOSPITAL_COMMUNITY): Payer: Medicare HMO

## 2021-07-11 ENCOUNTER — Ambulatory Visit (HOSPITAL_COMMUNITY)
Admission: RE | Admit: 2021-07-11 | Discharge: 2021-07-11 | Disposition: A | Discharge status: Home / Self Care | Payer: Medicare HMO | Source: Ambulatory Visit | Attending: Neuroradiology | Admitting: Neuroradiology

## 2021-07-11 DIAGNOSIS — I671 Cerebral aneurysm, nonruptured: Secondary | ICD-10-CM

## 2021-07-11 NOTE — Consult Note (Signed)
? ?Referring Physician(s): ?de Macedo Rodrigues,Izamar Linden ? ?Chief Complaint: ?The patient is seen in follow up today s/p cerebral angiogram. ? ?History of present illness: ? ?[name] is a 71 y.o. female with a past medical history significant for COPD, type 2 DM, HTN, osteoarthritis and gout.  She presented to the emergency on 05/26/2021 with hypoglycemic encephalopathy and fall.  She was worked up as a code stroke and underwent a CT angiogram of the head and neck that showed incidental bilateral MCA bifurcation aneurysms. Since she was discharged and her diabetes medications were modified, she no longer has experienced episodes of hypoglycemia. ?  ? Her family history is significant for hemorrhagic stroke in her paternal uncle, although it is unclear if aneurysmal.  No first degree relatives with known brain aneurysm.  She is a smoker (13 pack year).  ?  ?CT angiogram showed a 5 mm right MCA bifurcation aneurysm and a 4 mm left MCA bifurcation aneurysm.  Additionally, review of CT angiogram shows increased tortuosity of the major cervical arteries which can be seen in the setting of chronic hypertension.  ? ?She underwent a diagnostic cerebral angiogram on 07/05/2021 that confirmed the presence of MCA bifurcation mirror aneurysms, measuring 4.7 mm on the right and 3.3 mm on the left. Additionally, possible 2 mm left PCOM and 2 mm left lenticulostriate aneurysms vs infundibula were identified. A 3D rotational angiogram was not obtained to clarify these findings due to patient's inability to hold her breath. She comes to our service today accompanied by her husband to discuss findings and management options. ? ?Past Medical History:  ?Diagnosis Date  ? Diabetes mellitus without complication ([facility])   ? ? ?Past Surgical History:  ?Procedure Laterality Date  ? IR ANGIO INTRA EXTRACRAN SEL INTERNAL CAROTID BILAT MOD SED  07/05/2021  ? IR ANGIO VERTEBRAL SEL VERTEBRAL UNI R MOD SED  07/05/2021  ? IR US GUIDE VASC  ACCESS RIGHT  07/05/2021  ? ? ?Allergies: ?Phenylmercuric nitrate ? ?Medications: ?Prior to Admission medications   ?Medication Sig Start Date End Date Taking? Authorizing Provider  ?amLODipine (NORVASC) 5 MG tablet Take 5 mg by mouth daily.    [provider]  ?aspirin EC 81 MG tablet Take 81 mg by mouth daily. Swallow whole.    [provider]  ?atorvastatin (LIPITOR) 10 MG tablet Take 10 mg by mouth daily.    [provider]  ?buPROPion (ZYBAN) 150 MG 12 hr tablet Take 150 mg by mouth 2 (two) times daily. 05/07/21   [provider]  ?Calcium Carb-Cholecalciferol (CALCIUM + VITAMIN D3 PO) Take 1 tablet by mouth daily.    [provider]  ?carvedilol (COREG) 25 MG tablet Take 25 mg by mouth 2 (two) times daily with a meal.    [provider]  ?insulin degludec (TRESIBA) 100 UNIT/ML FlexTouch Pen Inject 40 Units into the skin daily. 05/27/21   Demaio, Alexa, MD  ?lisinopril (ZESTRIL) 40 MG tablet Take 40 mg by mouth daily.    [provider]  ?LYRICA 100 MG capsule Take 100 mg by mouth 2 (two) times daily. 05/10/21   [provider]  ?meloxicam (MOBIC) 15 MG tablet Take 15 mg by mouth daily. 05/07/21   [provider]  ?TRULICITY 0.75 MG/0.5ML SOPN Inject 0.75 mg into the skin once a week. 05/10/21   [provider]  ?vitamin B-12 (CYANOCOBALAMIN) 1000 MCG tablet Take 1,000 mcg by mouth daily.    [provider]  ?  ? ?  No family history on file. ? ?Social History  ? ?Socioeconomic History  ? Marital status: Married  ?  Spouse name: Not on file  ? Number of children: Not on file  ? Years of education: Not on file  ? Highest education level: Not on file  ?Occupational History  ? Not on file  ?Tobacco Use  ? Smoking status: Never  ?  Passive exposure: Never  ? Smokeless tobacco: Never  ?Substance and Sexual Activity  ? Alcohol use: Yes  ? Drug use: Never  ? Sexual activity: Not on file  ?Other Topics Concern  ? Not on file   ?Social History Narrative  ? Not on file  ? ?Social Determinants of Health  ? ?Financial Resource Strain: Not on file  ?Food Insecurity: Not on file  ?Transportation Needs: Not on file  ?Physical Activity: Not on file  ?Stress: Not on file  ?Social Connections: Not on file  ? ? ? ?Vital Signs: ?There were no vitals taken for this visit. ? ?Physical Exam ?Constitutional:   ?   Appearance: She is obese.  ?HENT:  ?   Head: Normocephalic.  ?   Comments: Healing abrasion in forehead. ?   Mouth/Throat:  ?   Mouth: Mucous membranes are moist.  ?Eyes:  ?   Extraocular Movements: Extraocular movements intact.  ?   Pupils: Pupils are equal, round, and reactive to light.  ?Neurological:  ?   Mental Status: She is alert and oriented to person, place, and time.  ?   Cranial Nerves: Cranial nerves 2-12 are intact.  ?   Sensory: Sensation is intact.  ?   Motor: Weakness present.  ?   Comments: LLE weakness related to knee osteoarthritis and prior surgery.  ? ?Imaging: ? ?IR ANGIO INTRA EXTRACRAN SEL INTERNAL CAROTID BILAT MOD SED ? ?IMPRESSION: ?1. MCA bifurcation mirror aneurysms, measuring 4.7 mm on the right ?and 3.3 mm on the left. ?2. Possible 2 mm left PCOM aneurysm and 2 mm left lenticulostriate ?aneurysm. 3D rotational angiograms were not obtained due to patient ?inability to hold her breath for the required duration of ?aquisition. ? ?Electronically Signed ?  By: Baldemar LenisKatyucia  de Macedo Rodrigues M.D. ?  On: 07/05/2021 15:39 ? ?Labs: ? ?CBC: ?Recent Labs  ?  05/11/21 ?1046 05/26/21 ?[id number] 05/26/21 ?[id number] 05/27/21 ?0143 07/05/21 ?[id number]  ?WBC 9.7 11.2*  --  8.4 7.5  ?HGB 12.4 13.3 15.0 12.1 11.8*  ?HCT 42.4 43.6 44.0 38.9 38.4  ?PLT 183 203  --  193 214  ? ? ?COAGS: ?Recent Labs  ?  05/11/21 ?1046 05/26/21 ?[id number] 07/05/21 ?[id number]  ?INR 1.0 1.0 1.0  ?APTT 28 27  --   ? ? ?BMP: ?Recent Labs  ?  05/11/21 ?1046 05/26/21 ?[id number] 05/26/21 ?[id number] 05/27/21 ?0143 07/05/21 ?[id number]  ?NA 139 137 141 139 141  ?K 4.8 4.6 4.3 4.2 4.8  ?CL 111 104 105  105 110  ?CO2 21* 17*  --  24 24  ?GLUCOSE 83 81 81 155* 71  ?BUN 17 17 17 15 22   ?CALCIUM 8.3* 9.0  --  9.1 8.6*  ?CREATININE 1.32* 1.08* 1.10* 1.09* 1.33*  ?GFRNONAA 44* 56*  --  55* 43*  ? ? ?LIVER FUNCTION TESTS: ?Recent Labs  ?  05/11/21 ?1046 05/26/21 ?[id number]  ?BILITOT 1.0 0.2*  ?AST 21 20  ?ALT 15 18  ?ALKPHOS 105 120  ?PROT 6.5 6.7  ?ALBUMIN 3.2* 3.4*  ? ? ?Assessment: ? ?I reviewed the images with [name]  and her husband. I explained the management options including watching with serial imaging, open neurosurgical clipping and endovascular embolization. She would like to proceed with endovascular embolization. I recommended treating the right MCA bifurcation aneurysm first with coils, explaining the possible need to use a stent to secure the branching vessel. She will be started on ASA 81 mg qd and brilinta 90 mg bid 7 days prior to intervention. We will reach out to her in the next few days to schedule the procedure. ? ?Signed: ?Baldemar Lenis, MD ?07/11/2021, 3:41 PM ? ? ? ?I spent a total of    40 Minutes in face to face in clinical consultation, greater than 50% of which was counseling/coordinating care for brain aneurysms. ?  ?

## 2021-07-16 HISTORY — PX: IR RADIOLOGIST EVAL & MGMT: IMG5224

## 2021-08-09 ENCOUNTER — Other Ambulatory Visit (HOSPITAL_COMMUNITY): Payer: Self-pay | Admitting: Neuroradiology

## 2021-08-09 DIAGNOSIS — I671 Cerebral aneurysm, nonruptured: Secondary | ICD-10-CM

## 2021-08-31 ENCOUNTER — Telehealth (HOSPITAL_COMMUNITY): Payer: Self-pay | Admitting: Radiology

## 2021-08-31 NOTE — Telephone Encounter (Signed)
Called pt, left VM for her to call back to schedule her aneurysm treatment with Dr. Quay Burow. JM

## 2021-09-04 ENCOUNTER — Telehealth (HOSPITAL_COMMUNITY): Payer: Self-pay | Admitting: Radiology

## 2021-09-04 NOTE — Telephone Encounter (Signed)
Called pt, left VM for her to call to schedule aneurysm treatment with Dr. Rondall Allegra. JM

## 2022-02-12 DIAGNOSIS — I509 Heart failure, unspecified: Secondary | ICD-10-CM | POA: Diagnosis not present

## 2022-02-12 DIAGNOSIS — I110 Hypertensive heart disease with heart failure: Secondary | ICD-10-CM | POA: Diagnosis not present

## 2022-02-12 DIAGNOSIS — E119 Type 2 diabetes mellitus without complications: Secondary | ICD-10-CM | POA: Diagnosis not present

## 2022-02-12 DIAGNOSIS — J449 Chronic obstructive pulmonary disease, unspecified: Secondary | ICD-10-CM | POA: Diagnosis not present

## 2022-02-12 DIAGNOSIS — I10 Essential (primary) hypertension: Secondary | ICD-10-CM | POA: Diagnosis not present

## 2022-03-14 DIAGNOSIS — E119 Type 2 diabetes mellitus without complications: Secondary | ICD-10-CM | POA: Diagnosis not present

## 2022-04-26 IMAGING — US IR CAROTID INTERNAL HEAD/NECK BILAT  (MS)
1 series · 1 of 1 positions shown · non-contrast
Comparison: CT angiogram of the head and neck May 11, 2021

INDICATION: Koka Bakker is a 71 y.o. female with a past medical history
significant for COPD, type 2 DM, HTN, osteoarthritis and gout. She
presented to the emergency on 05/26/2021 with hypoglycemic
encephalopathy and fall. She was worked up as a code stroke and
underwent a CT angiogram of the head and neck that showed incidental
bilateral MCA bifurcation aneurysms. She comes to our service today
for a diagnostic cerebral angiogram to better evaluate aneurysms.

EXAM:
ULTRASOUND-GUIDED VASCULAR [REDACTED] CEREBRAL ANGIOGRAM
TECHNIQUE: Informed written consent was obtained from the patient after a
thorough discussion of the procedural risks, benefits and
alternatives. All questions were addressed.

[Series 1: ir (id) (id) · 1 of 1 slices shown]
[im 1/1]
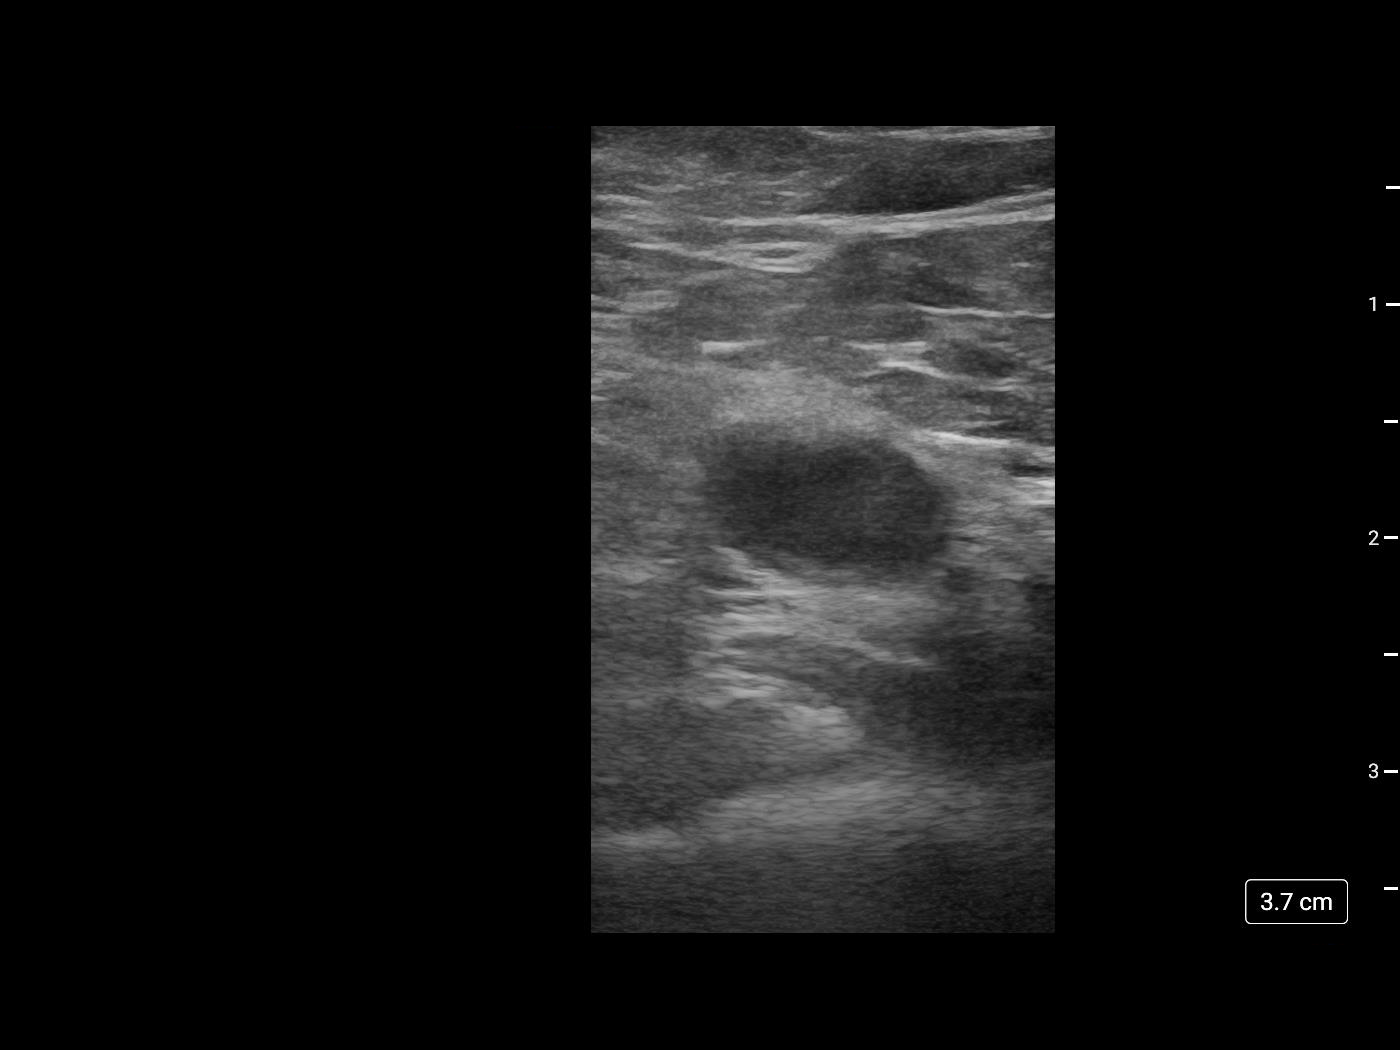

[1 of 1 positions shown; findings below may reference images not displayed]

MEDICATIONS:
No antibiotics administered.

ANESTHESIA/SEDATION:
Moderate (conscious) sedation was employed during this procedure. A
total of Versed 1 mg and Fentanyl 37.5 mcg was administered
intravenously by the radiology nurse.

Total intra-service moderate Sedation Time: 45 minutes. The
patient's level of consciousness and vital signs were monitored
continuously by radiology nursing throughout the procedure under my
direct supervision.

CONTRAST:  65 mL of Omnipaque 300 mg/mL

FLUOROSCOPY:
Radiation Exposure Index (as provided by the fluoroscopic device):
103 mGy Kerma

COMPLICATIONS:
None immediate.
Maximal Sterile Barrier Technique was utilized including caps, mask,
sterile gowns, sterile gloves, sterile drape, hand hygiene and skin
antiseptic. A timeout was performed prior to the initiation of the
procedure.

The right groin was prepped and draped in the usual sterile fashion.
The skin and subcutaneous were infiltrated with lidocaine 1%. Using
a micropuncture kit and the modified Seldinger technique, access was
gained to the right common femoral artery and a by French sheath was
placed. Real-time ultrasound guidance was utilized for vascular
access including the acquisition of a permanent ultrasound image
documenting patency of the accessed vessel.

Under fluoroscopy, a 5 Reynhard Jiane 2 catheter was navigated
over a 0.035" Terumo Glidewire into the aortic arch. The catheter
was placed into the right common carotid artery. Frontal and lateral
angiograms of the neck were obtained. Using biplane roadmap
guidance, the catheter was advanced into the right internal carotid
artery. Frontal and lateral angiograms of the head were obtained
followed by multiple magnified oblique views of the head, centered
on the right MCA bifurcation.

The catheter was then placed into the right subclavian artery. Using
roadmap guidance, the catheter was advanced into the right vertebral
artery. Frontal and lateral angiograms of the head were obtained.

Next, the catheter was placed into the left common carotid artery.
Frontal and lateral angiograms of the neck were obtained. Using
biplane roadmap guidance, the catheter was placed into the left
internal carotid artery. Frontal lateral angiograms of the head were
obtained followed by multiple magnified oblique views centered on
the left MCA bifurcation.

The catheter was subsequently withdrawn.

Right common femoral artery angiogram was obtained in right anterior
oblique view. The puncture is at the level of the common femoral
artery. The artery has normal caliber, adequate for closure device.
A 5 French Exoseal was utilized for access closure. Adequate
hemostasis was achieved.
FINDINGS: Right CCA angiograms: Cervical angiograms show mild atherosclerotic
changes in the right carotid bifurcation without hemodynamically
significant stenosis. Increased tortuosity of the right common
carotid artery and cervical segment of the right internal carotid
artery.

Right ICA angiograms: Luminal irregularity of the cavernous segment
of the right ICA with mild non flow limiting stenosis. There is
brisk vascular contrast filling of the right ACA and MCA vascular
trees. A saccular aneurysm is seen at the right MCA bifurcation
measuring approximately 4.7 x 3 mm. No abnormally high-flow, early
draining veins are seen. No regions of abnormal hypervascularity are
noted. The visualized dural sinuses are patent.

Right vertebral artery angiograms: The right vertebral artery,
basilar artery, and bilateral posterior cerebral arteries are
unremarkable. Incidentally noted fenestration of the V4 segment of
the right vertebral artery. The left vertebral artery (V4) is seen
by contrast reflux in appear unremarkable. Luminal caliber is smooth
and tapering. No aneurysms or abnormally high-flow, early draining
veins are seen. No regions of abnormal hypervascularity are noted.
The visualized dural sinuses are patent.

Left CCA angiograms: Cervical angiograms show mild atherosclerotic
changes in the left carotid bifurcation without hemodynamically
significant stenosis. Increased tortuosity of the left common
carotid artery and cervical segment of the left internal carotid
artery.

Left ICA angiograms: Luminal irregularity of the cavernous and
clinoid segment of the left ICA, consistent with atherosclerotic
disease without hemodynamically significant stenosis. In outpouching
from the communicating segment of the left ICA measuring 2 mm,
likely representing a posterior communicating artery aneurysm versus
infundibulum. A 2 mm outpouching from the left M1/MCA at the origin
of the lenticulostriate arteries. A saccular aneurysm is seen at the
left MCA bifurcation measuring approximately 3.3 x 2.7 mm. There is
brisk vascular contrast filling of the left ACA and MCA vascular
trees. No abnormally high-flow, early draining veins are seen. No
regions of abnormal hypervascularity are noted. The visualized dural
sinuses are patent.

Right common femoral artery angiograms: The access is at the level
of the mid right common femoral artery. The femoral artery has
normal caliber, adequate for closure device.

PROCEDURE:
No intervention performed.
IMPRESSION: 1. MCA bifurcation mirror aneurysms, measuring 4.7 mm on the right
and 3.3 mm on the left.
2. Possible 2 mm left PCOM aneurysm and 2 mm left lenticulostriate
aneurysm. 3D rotational angiograms were not obtained due to patient
inability to hold her breath for the required duration of
aquisition.

PLAN:
Patient may return for consultation to discuss aneurysm management
options.

## 2022-11-22 ENCOUNTER — Emergency Department
Admission: EM | Admit: 2022-11-22 | Discharge: 2022-11-22 | Disposition: A | Discharge status: Home / Self Care | Payer: Medicare (Managed Care) | Attending: Emergency Medicine | Admitting: Emergency Medicine

## 2022-11-22 ENCOUNTER — Emergency Department: Payer: Medicare (Managed Care)

## 2022-11-22 ENCOUNTER — Other Ambulatory Visit: Payer: Self-pay

## 2022-11-22 DIAGNOSIS — M25532 Pain in left wrist: Secondary | ICD-10-CM | POA: Diagnosis not present

## 2022-11-22 DIAGNOSIS — S6992XA Unspecified injury of left wrist, hand and finger(s), initial encounter: Secondary | ICD-10-CM | POA: Diagnosis present

## 2022-11-22 DIAGNOSIS — S52502A Unspecified fracture of the lower end of left radius, initial encounter for closed fracture: Secondary | ICD-10-CM | POA: Insufficient documentation

## 2022-11-22 DIAGNOSIS — Y92000 Kitchen of unspecified non-institutional (private) residence as  the place of occurrence of the external cause: Secondary | ICD-10-CM | POA: Insufficient documentation

## 2022-11-22 DIAGNOSIS — W1839XA Other fall on same level, initial encounter: Secondary | ICD-10-CM | POA: Diagnosis not present

## 2022-11-22 MED ORDER — OXYCODONE-ACETAMINOPHEN 5-325 MG PO TABS
1.0000 | ORAL_TABLET | Freq: Once | ORAL | Status: AC
  Administered 2022-11-22: 1 via ORAL
  Filled 2022-11-22: qty 1

## 2022-11-22 MED ORDER — ONDANSETRON 4 MG PO TBDP
4.0000 mg | ORAL_TABLET | Freq: Once | ORAL | Status: AC
  Administered 2022-11-22: 4 mg via ORAL
  Filled 2022-11-22: qty 1

## 2022-11-22 NOTE — ED Triage Notes (Signed)
Pt presents to ED with /co of falling on water int he kitchen. Pt denies hitting head or having LOC. Pt endorses taking Plavix. NAD noted. Pt c/o of L wrist pain and middle and ring finger on L hand. NAD noted.

## 2022-11-22 NOTE — ED Provider Notes (Signed)
[facility] Provider Note  Patient Contact: 6:23 PM (approximate)   History   Fall and Wrist Pain   HPI  [name] is a 72 y.o. female presents to the emergency department after patient had a mechanical fall.  Patient was cooking in her kitchen and lost her balance and fell onto an outstretched hand.  She did not hit her head or neck.  No numbness or tingling in the upper and lower extremities.  No similar injuries in the past.      Physical Exam   Triage Vital Signs: ED Triage Vitals  Encounter Vitals Group     BP 11/22/22 1719 (!) 158/89     Systolic BP Percentile --      Diastolic BP Percentile --      Pulse Rate 11/22/22 1715 (!) 102     Resp 11/22/22 1715 18     Temp 11/22/22 1715 98.8 F (37.1 C)     Temp Source 11/22/22 1715 Oral     SpO2 11/22/22 1715 97 %     Weight 11/22/22 1716 196 lb 3.4 oz (89 kg)     Height 11/22/22 1716 5\' 3"  (1.6 m)     Head Circumference --      Peak Flow --      Pain Score 11/22/22 1716 8     Pain Loc --      Pain Education --      Exclude from Growth Chart --     Most recent vital signs: Vitals:   11/22/22 1715 11/22/22 1719  BP:  (!) 158/89  Pulse: (!) 102   Resp: 18   Temp: 98.8 F (37.1 C)   SpO2: 97%      General: Alert and in no acute distress. Eyes:  PERRL. EOMI. Head: No acute traumatic findings ENT:      Nose: No congestion/rhinnorhea.      Mouth/Throat: Mucous membranes are moist. Neck: No stridor. No cervical spine tenderness to palpation. Cardiovascular:  Good peripheral perfusion Respiratory: Normal respiratory effort without tachypnea or retractions. Lungs CTAB. Good air entry to the bases with no decreased or absent breath sounds. Gastrointestinal: Bowel sounds 4 quadrants. Soft and nontender to palpation. No guarding or rigidity. No palpable masses. No distention. No CVA tenderness. Musculoskeletal: Patient performs limited range of motion of the left wrist due to pain.   Palpable radial and ulnar pulses bilaterally and symmetrically.  Capillary refill less than 2 seconds on the left. Neurologic:  No gross focal neurologic deficits are appreciated.  Skin:   No rash noted Other:   ED Results / Procedures / Treatments   Labs (all labs ordered are listed, but only abnormal results are displayed) Labs Reviewed - No data to display      RADIOLOGY  I personally viewed and evaluated these images as part of my medical decision making, as well as reviewing the written report by the radiologist.  ED Provider Interpretation: Nondisplaced distal radius fracture on the left.   PROCEDURES:  Critical Care performed: No  Procedures   MEDICATIONS ORDERED IN ED: Medications  oxyCODONE-acetaminophen (PERCOCET/ROXICET) 5-325 MG per tablet 1 tablet (has no administration in time range)  ondansetron (ZOFRAN-ODT) disintegrating tablet 4 mg (has no administration in time range)     IMPRESSION / MDM / ASSESSMENT AND PLAN / ED COURSE  I reviewed the triage vital signs and the nursing notes.  Assessment and plan Distal radius fracture 72 year old female presents to the emergency department after a mechanical fall resulting in nondisplaced distal radius fracture.  Patient was placed in a splint for comfort and Percocet was given for pain.  Patient was discharged with Percocet and advised to follow-up with orthopedics.     FINAL CLINICAL IMPRESSION(S) / ED DIAGNOSES   Final diagnoses:  Closed fracture of distal end of left radius, unspecified fracture morphology, initial encounter     Rx / DC Orders   ED Discharge Orders     None        Note:  This document was prepared using Dragon voice recognition software and may include unintentional dictation errors.   Pia Mau [place], PA-C 11/22/22 1827    Dionne Bucy, MD 11/22/22 [phone]

## 2023-03-01 ENCOUNTER — Emergency Department
Admission: EM | Admit: 2023-03-01 | Discharge: 2023-03-01 | Disposition: A | Discharge status: Home / Self Care | Payer: Medicare HMO | Attending: Emergency Medicine | Admitting: Emergency Medicine

## 2023-03-01 ENCOUNTER — Other Ambulatory Visit: Payer: Self-pay

## 2023-03-01 ENCOUNTER — Emergency Department: Payer: Medicare HMO

## 2023-03-01 DIAGNOSIS — S20212A Contusion of left front wall of thorax, initial encounter: Secondary | ICD-10-CM | POA: Insufficient documentation

## 2023-03-01 DIAGNOSIS — S299XXA Unspecified injury of thorax, initial encounter: Secondary | ICD-10-CM | POA: Diagnosis present

## 2023-03-01 DIAGNOSIS — E1165 Type 2 diabetes mellitus with hyperglycemia: Secondary | ICD-10-CM | POA: Insufficient documentation

## 2023-03-01 DIAGNOSIS — I10 Essential (primary) hypertension: Secondary | ICD-10-CM | POA: Insufficient documentation

## 2023-03-01 DIAGNOSIS — W010XXA Fall on same level from slipping, tripping and stumbling without subsequent striking against object, initial encounter: Secondary | ICD-10-CM | POA: Insufficient documentation

## 2023-03-01 MED ORDER — LIDOCAINE 5 % EX PTCH: 1.0000 | MEDICATED_PATCH | Freq: Two times a day (BID) | CUTANEOUS | 0 refills | Status: AC

## 2023-03-01 MED ORDER — LIDOCAINE 5 % EX PTCH: 1.0000 | MEDICATED_PATCH | Freq: Two times a day (BID) | CUTANEOUS | 0 refills | Status: DC

## 2023-03-01 MED ORDER — ACETAMINOPHEN 325 MG PO TABS
650.0000 mg | ORAL_TABLET | Freq: Once | ORAL | Status: AC
  Administered 2023-03-01: 650 mg via ORAL
  Filled 2023-03-01: qty 2

## 2023-03-01 MED ORDER — TRAMADOL HCL 50 MG PO TABS
50.0000 mg | ORAL_TABLET | Freq: Once | ORAL | Status: AC
  Administered 2023-03-01: 50 mg via ORAL
  Filled 2023-03-01: qty 1

## 2023-03-01 MED ORDER — LIDOCAINE 5 % EX PTCH
1.0000 | MEDICATED_PATCH | CUTANEOUS | Status: DC
  Administered 2023-03-01: 1 via TRANSDERMAL
  Filled 2023-03-01: qty 1

## 2023-03-01 MED ORDER — TRAMADOL HCL 50 MG PO TABS: 50.0000 mg | ORAL_TABLET | Freq: Two times a day (BID) | ORAL | 0 refills | Status: DC | PRN

## 2023-03-01 NOTE — Discharge Instructions (Addendum)
Your x-ray does not reveal evidence of rib fracture or pneumothorax.  You may use the pain medications to help with your symptoms.  Remember that the tramadol will make you sleepy, so only take if you are with somebody else, and do not drive, operate heavy machinery, or perform any test that require concentration.  Also remember that this medication can be addictive.  Do not take with any other sedating medications.  Please follow-up with your outpatient provider.  Please return for any new, worsening, or change in symptoms or other concerns.  It was a pleasure caring for you today.

## 2023-03-01 NOTE — ED Provider Notes (Signed)
[facility] Provider Note    Event Date/Time   First MD Initiated Contact with Patient 03/01/23 1409     (approximate)   History   Fall   HPI  [name] is a 72 y.o. female who presents today for evaluation of left-sided rib pain after a fall that occurred last week.  Patient reports that she tripped over her dog and landed on her left lateral side.  She did not strike her head or lose consciousness.  She reports that she has had pain to her left ribs ever since.  She reports her pain is worsened with movement and with coughing.  She has not had any headache, nausea, vomiting, visual changes, abdominal pain, back pain, extremity pain, or any other injuries.  She denies taking anticoagulation.  Patient Active Problem List   Diagnosis Date Noted   Type 2 diabetes mellitus with hypoglycemia without coma, with long-term current use of insulin (HCC) 05/27/2021   Hypertension 05/27/2021   Cervical spine degeneration 05/27/2021   Aortic atherosclerosis (HCC) 05/27/2021   Hypomagnesemia 05/27/2021   Syncope 05/26/2021          Physical Exam   Triage Vital Signs: ED Triage Vitals  Encounter Vitals Group     BP 03/01/23 1306 (!) 147/77     Systolic BP Percentile --      Diastolic BP Percentile --      Pulse Rate 03/01/23 1306 88     Resp 03/01/23 1304 18     Temp 03/01/23 1304 98.2 F (36.8 C)     Temp src --      SpO2 03/01/23 1306 95 %     Weight 03/01/23 1304 174 lb (78.9 kg)     Height 03/01/23 1304 5\' 4"  (1.626 m)     Head Circumference --      Peak Flow --      Pain Score --      Pain Loc --      Pain Education --      Exclude from Growth Chart --     Most recent vital signs: Vitals:   03/01/23 1304 03/01/23 1306  BP:  (!) 147/77  Pulse:  88  Resp: 18   Temp: 98.2 F (36.8 C)   SpO2:  95%    Physical Exam Vitals and nursing note reviewed.  Constitutional:      General: Awake and alert. No acute distress.    Appearance:  Normal appearance. The patient is obese.  HENT:     Head: Normocephalic and atraumatic.     Mouth: Mucous membranes are moist.  Eyes:     General: PERRL. Normal EOMs        Right eye: No discharge.        Left eye: No discharge.     Conjunctiva/sclera: Conjunctivae normal.  Cardiovascular:     Rate and Rhythm: Normal rate and regular rhythm.     Pulses: Normal pulses.  Pulmonary:     Effort: Pulmonary effort is normal. No respiratory distress.     Breath sounds: Normal breath sounds.  Left lateral chest wall tenderness without ecchymosis or other injuries noted. Abdominal:     Abdomen is soft. There is no abdominal tenderness. No rebound or guarding. No distention.  No ecchymosis Musculoskeletal:        General: No swelling. Normal range of motion.     Cervical back: Normal range of motion and neck supple.  Skin:  General: Skin is warm and dry.     Capillary Refill: Capillary refill takes less than 2 seconds.     Findings: No rash.  Neurological:     Mental Status: The patient is awake and alert.      ED Results / Procedures / Treatments   Labs (all labs ordered are listed, but only abnormal results are displayed) Labs Reviewed - No data to display   EKG     RADIOLOGY I independently reviewed and interpreted imaging and agree with radiologists findings.     PROCEDURES:  Critical Care performed:   Procedures   MEDICATIONS ORDERED IN ED: Medications  lidocaine (LIDODERM) 5 % 1 patch (1 patch Transdermal Patch Applied 03/01/23 1422)  acetaminophen (TYLENOL) tablet 650 mg (650 mg Oral Given 03/01/23 1423)  traMADol (ULTRAM) tablet 50 mg (50 mg Oral Given 03/01/23 1423)     IMPRESSION / MDM / ASSESSMENT AND PLAN / ED COURSE  I reviewed the triage vital signs and the nursing notes.   Differential diagnosis includes, but is not limited to, rib fracture, costochondritis, pneumothorax, contusion.  Patient is awake and alert, hemodynamically stable and  afebrile.  She has normal oxygen saturation of 95% on room air.  Her chest wall pain is easily reproducible with palpation.  Chest x-ray obtained reveals no rib fracture, no pneumothorax.  She has no abdominal tenderness to suggest visceral organ injury, no hemodynamic instability.  There was no head strike or LOC, she is not having any headache, vomiting, focal neurological deficits, cervical spine tenderness, and not anticoagulated, no indication for CT head and neck per [place] criteria.  Patient was treated symptomatically in the emergency department with good effect.  She was given a prescription for the tramadol to use at home, though advised that this medication is highly addictive and should only be used for breakthrough pain.  Also advised that she should not take this with any other sedating medications.  Also advised that she cannot drive, operate heavy machinery, or perform test require concentration while taking this medication.  We discussed strict return precautions the importance of close outpatient follow-up.  Patient understands and agrees with plan.  She was discharged in stable condition with her family member.   Patient's presentation is most consistent with acute complicated illness / injury requiring diagnostic workup.    FINAL CLINICAL IMPRESSION(S) / ED DIAGNOSES   Final diagnoses:  Chest wall contusion, left, initial encounter     Rx / DC Orders   ED Discharge Orders          Ordered    lidocaine (LIDODERM) 5 %  Every 12 hours,   Status:  Discontinued        03/01/23 1438    traMADol (ULTRAM) 50 MG tablet  Every 12 hours PRN        03/01/23 1444    lidocaine (LIDODERM) 5 %  Every 12 hours        03/01/23 1444             Note:  This document was prepared using Dragon voice recognition software and may include unintentional dictation errors.   Jackelyn Hoehn, PA-C 03/01/23 1451    Janith Lima, MD 03/02/23 0700

## 2023-03-01 NOTE — ED Triage Notes (Signed)
Pt to ED For fall one week ago, tripped over dog. Reports pain to left rib cage area. Reports pain worsens with coughing

## 2023-10-24 ENCOUNTER — Emergency Department

## 2023-10-24 ENCOUNTER — Other Ambulatory Visit: Payer: Self-pay

## 2023-10-24 ENCOUNTER — Emergency Department
Admission: EM | Admit: 2023-10-24 | Discharge: 2023-10-25 | Disposition: A | Discharge status: Home / Self Care | Attending: Emergency Medicine | Admitting: Emergency Medicine

## 2023-10-24 DIAGNOSIS — I10 Essential (primary) hypertension: Secondary | ICD-10-CM | POA: Insufficient documentation

## 2023-10-24 DIAGNOSIS — W01198A Fall on same level from slipping, tripping and stumbling with subsequent striking against other object, initial encounter: Secondary | ICD-10-CM | POA: Diagnosis not present

## 2023-10-24 DIAGNOSIS — S0990XA Unspecified injury of head, initial encounter: Secondary | ICD-10-CM | POA: Insufficient documentation

## 2023-10-24 DIAGNOSIS — W19XXXA Unspecified fall, initial encounter: Secondary | ICD-10-CM

## 2023-10-24 DIAGNOSIS — E119 Type 2 diabetes mellitus without complications: Secondary | ICD-10-CM | POA: Insufficient documentation

## 2023-10-24 DIAGNOSIS — Y92810 Car as the place of occurrence of the external cause: Secondary | ICD-10-CM | POA: Diagnosis not present

## 2023-10-24 LAB — COMPREHENSIVE METABOLIC PANEL WITH GFR
ALT: 11 U/L (ref 0–44)
AST: 18 U/L (ref 15–41)
Albumin: 3.5 g/dL (ref 3.5–5.0)
Alkaline Phosphatase: 109 U/L (ref 38–126)
Anion gap: 15 (ref 5–15)
BUN: 36 mg/dL — ABNORMAL HIGH (ref 8–23)
CO2: 24 mmol/L (ref 22–32)
Calcium: 9.1 mg/dL (ref 8.9–10.3)
Chloride: 98 mmol/L (ref 98–111)
Creatinine, Ser: 1.89 mg/dL — ABNORMAL HIGH (ref 0.44–1.00)
GFR, Estimated: 28 mL/min — ABNORMAL LOW (ref >60)
Glucose, Bld: 359 mg/dL — ABNORMAL HIGH (ref 70–99)
Potassium: 3.6 mmol/L (ref 3.5–5.1)
Sodium: 137 mmol/L (ref 135–145)
Total Bilirubin: 0.6 mg/dL (ref 0.0–1.2)
Total Protein: 6.9 g/dL (ref 6.5–8.1)

## 2023-10-24 LAB — CBC
HCT: 43 % (ref 36.0–46.0)
Hemoglobin: 14.1 g/dL (ref 12.0–15.0)
MCH: 29.4 pg (ref 26.0–34.0)
MCHC: 32.8 g/dL (ref 30.0–36.0)
MCV: 89.6 fL (ref 80.0–100.0)
Platelets: 213 K/uL (ref 150–400)
RBC: 4.8 MIL/uL (ref 3.87–5.11)
RDW: 13.7 % (ref 11.5–15.5)
WBC: 10.8 K/uL — ABNORMAL HIGH (ref 4.0–10.5)
nRBC: 0 % (ref 0.0–0.2)

## 2023-10-24 LAB — URINALYSIS, ROUTINE W REFLEX MICROSCOPIC
Bilirubin Urine: NEGATIVE
Glucose, UA: 50 mg/dL — AB
Hgb urine dipstick: NEGATIVE
Ketones, ur: 5 mg/dL — AB
Nitrite: NEGATIVE
Protein, ur: 100 mg/dL — AB
Specific Gravity, Urine: 1.024 (ref 1.005–1.030)
pH: 5 (ref 5.0–8.0)

## 2023-10-24 NOTE — ED Provider Notes (Signed)
 [facility] Provider Note    Event Date/Time   First MD Initiated Contact with Patient 10/24/23 2131     (approximate)   History   Chief Complaint: Fall   HPI  [name] is a 73 y.o. female with a past history of diabetes, hypertension who was brought to the ED after a fall.  She was being helped to the car from the nail salon today by her husband, when he needed to leave her alone briefly to go get the car.  During that time she lost her balance and fell back, hitting her head on the ground.  No loss of consciousness or blood thinner use.  No vomiting.  Does complain of headache.  No vision change.  No paresthesias.  Also complains of pain in the left knee after the fall, worse with flexion.        Past Medical History:  Diagnosis Date   Diabetes mellitus without complication [facility])     Current Outpatient Rx   Order #: [id number] Class: Historical Med   Order #: [id number] Class: Historical Med   Order #: [id number] Class: Historical Med   Order #: [id number] Class: Historical Med   Order #: [id number] Class: Historical Med   Order #: [id number] Class: Historical Med   Order #: [id number] Class: Normal   Order #: [id number] Class: Historical Med   Order #: [id number] Class: Historical Med   Order #: [id number] Class: Historical Med   Order #: [id number] Class: Print   Order #: [id number] Class: Historical Med   Order #: [id number] Class: Historical Med    Past Surgical History:  Procedure Laterality Date   IR ANGIO INTRA EXTRACRAN SEL INTERNAL CAROTID BILAT MOD SED  07/05/2021   IR ANGIO VERTEBRAL SEL VERTEBRAL UNI R MOD SED  07/05/2021   IR RADIOLOGIST EVAL & MGMT  07/16/2021   IR US  GUIDE VASC ACCESS RIGHT  07/05/2021    Physical Exam   Triage Vital Signs: ED Triage Vitals  Encounter Vitals Group     BP 10/24/23 1611 118/78     Girls Systolic BP Percentile --      Girls Diastolic BP Percentile --      Boys Systolic BP Percentile --      Boys Diastolic  BP Percentile --      Pulse Rate 10/24/23 1611 75     Resp 10/24/23 1611 20     Temp 10/24/23 1611 98.1 F (36.7 C)     Temp Source 10/24/23 1611 Oral     SpO2 10/24/23 1611 99 %     Weight --      Height --      Head Circumference --      Peak Flow --      Pain Score 10/24/23 1610 7     Pain Loc --      Pain Education --      Exclude from Growth Chart --     Most recent vital signs: Vitals:   10/24/23 2130 10/24/23 2229  BP: 114/77   Pulse: 80   Resp: 18   Temp:  98.5 F (36.9 C)  SpO2: 100%     General: Awake, no distress.  CV:  Good peripheral perfusion.  Resp:  Normal effort.  Abd:  No distention.  Other:  No midline spinal tenderness.  Normal mental status.  Full range of motion all extremities without bony point tenderness   ED Results / Procedures / Treatments   Labs (all labs ordered are listed, but only abnormal results are  displayed) Labs Reviewed  COMPREHENSIVE METABOLIC PANEL WITH GFR - Abnormal; Notable for the following components:      Result Value   Glucose, Bld 359 (*)    BUN 36 (*)    Creatinine, Ser 1.89 (*)    GFR, Estimated 28 (*)    All other components within normal limits  CBC - Abnormal; Notable for the following components:   WBC 10.8 (*)    All other components within normal limits  URINALYSIS, ROUTINE W REFLEX MICROSCOPIC  CBG MONITORING, ED     EKG Interpreted by me Sinus rhythm rate of 81.  Normal axis, normal intervals.  Normal QRS and ST segments.  Diffuse T wave flattening.  No significant change compared to previous EKG on May 28, 2021.   RADIOLOGY CT head interpreted by me, negative for intracranial hemorrhage.  Radiology report reviewed CT cervical spine unremarkable X-ray left knee unremarkable   PROCEDURES:  Procedures   MEDICATIONS ORDERED IN ED: Medications - No data to display   IMPRESSION / MDM / ASSESSMENT AND PLAN / ED COURSE  I reviewed the triage vital signs and the nursing notes.  DDx:  Intracranial hemorrhage, C-spine fracture, left knee fracture, dehydration, anemia, electrolyte derangement  Patient's presentation is most consistent with acute presentation with potential threat to life or bodily function.  Patient presents with mechanical fall, reassuring vitals, labs EKG and imaging.  Stable to continue medications and follow-up with her doctor.       FINAL CLINICAL IMPRESSION(S) / ED DIAGNOSES   Final diagnoses:  Fall, initial encounter  Injury of head, initial encounter     Rx / DC Orders   ED Discharge Orders     None        Note:  This document was prepared using Dragon voice recognition software and may include unintentional dictation errors.   Viviann Pastor, MD 10/24/23 2302

## 2023-10-24 NOTE — ED Notes (Signed)
 Pt provided with walker and educated on use. Pt verbalized and demonstrated understanding. NADN. Pt educated to follow up with PCP.

## 2023-10-24 NOTE — ED Notes (Signed)
Patient given ice water at this time.

## 2023-10-24 NOTE — Discharge Instructions (Signed)
 Your CT scan of the head and neck and x-ray of the knee are all okay today.  Take Tylenol  as needed for pain, continue your usual home medications.

## 2023-10-24 NOTE — ED Triage Notes (Signed)
 First nurse note. [facility] reports patient had mechanical fall today at nail salon, no LOC, no blood thinners. Complaining of headache.   BP 126/63 O2 98% HR 82 BGL 267

## 2023-10-24 NOTE — ED Triage Notes (Signed)
 Patient states she lost her balance today and fell backwards onto pavement. States she has had unsteady gait since Monday.

## 2023-10-24 NOTE — ED Provider Triage Note (Signed)
 Emergency Medicine Provider Triage Evaluation Note  [name] , a 73 y.o. female  was evaluated in triage.  Pt complains of fall.  Patient states symptoms started last Monday feeling dizzy and with imbalance.  Today patient fell he hit her occipital area with posterior headache.  Patient denies taking blood thinners, she is taking aspirin .  Patient states having frequency, denies dysuria, fever or back pain.  Review of Systems  Positive:  Negative:  Physical Exam  BP 118/78   Pulse 75   Temp 98.1 F (36.7 C) (Oral)   Resp 20   SpO2 99%  Gen:   Awake, no distress   Resp:  Normal effor MSK:   Moves extremities without difficulty  Other:    Medical Decision Making  Medically screening exam initiated at 4:13 PM.  Appropriate orders placed.  [name] was informed that the remainder of the evaluation will be completed by another provider, this initial triage assessment does not replace that evaluation, and the importance of remaining in the ED until their evaluation is complete.  Patient who presents today after falling and hitting occipital area of her head.  Denies loss of consciousness, she is not taking blood thinners, she is taking aspirin .  Started feeling dizzy last Monday.  Patient states having frequency.  Ordered CBC CMP EKG CT of the head UA   Janit Kast, PA-C 10/24/23 1614

## 2023-10-24 NOTE — ED Notes (Signed)
Stafford MD at bedside 

## 2023-11-18 ENCOUNTER — Inpatient Hospital Stay
Admission: EM | Admit: 2023-11-18 | Discharge: 2023-11-25 | DRG: 480 | Disposition: A | Discharge status: Rehabilitation Facility | Attending: Internal Medicine | Admitting: Internal Medicine

## 2023-11-18 ENCOUNTER — Emergency Department

## 2023-11-18 ENCOUNTER — Other Ambulatory Visit: Payer: Self-pay

## 2023-11-18 ENCOUNTER — Encounter: Payer: Self-pay | Admitting: *Deleted

## 2023-11-18 DIAGNOSIS — Z91048 Other nonmedicinal substance allergy status: Secondary | ICD-10-CM

## 2023-11-18 DIAGNOSIS — Z66 Do not resuscitate: Secondary | ICD-10-CM | POA: Diagnosis present

## 2023-11-18 DIAGNOSIS — Z5941 Food insecurity: Secondary | ICD-10-CM

## 2023-11-18 DIAGNOSIS — S0990XA Unspecified injury of head, initial encounter: Secondary | ICD-10-CM

## 2023-11-18 DIAGNOSIS — I639 Cerebral infarction, unspecified: Secondary | ICD-10-CM

## 2023-11-18 DIAGNOSIS — Z720 Tobacco use: Secondary | ICD-10-CM

## 2023-11-18 DIAGNOSIS — S72112A Displaced fracture of greater trochanter of left femur, initial encounter for closed fracture: Principal | ICD-10-CM | POA: Diagnosis present

## 2023-11-18 DIAGNOSIS — N1832 Chronic kidney disease, stage 3b: Secondary | ICD-10-CM | POA: Diagnosis present

## 2023-11-18 DIAGNOSIS — Z791 Long term (current) use of non-steroidal anti-inflammatories (NSAID): Secondary | ICD-10-CM

## 2023-11-18 DIAGNOSIS — Z716 Tobacco abuse counseling: Secondary | ICD-10-CM

## 2023-11-18 DIAGNOSIS — I129 Hypertensive chronic kidney disease with stage 1 through stage 4 chronic kidney disease, or unspecified chronic kidney disease: Secondary | ICD-10-CM | POA: Diagnosis present

## 2023-11-18 DIAGNOSIS — I6389 Other cerebral infarction: Secondary | ICD-10-CM | POA: Diagnosis present

## 2023-11-18 DIAGNOSIS — W19XXXA Unspecified fall, initial encounter: Secondary | ICD-10-CM | POA: Diagnosis present

## 2023-11-18 DIAGNOSIS — R42 Dizziness and giddiness: Secondary | ICD-10-CM | POA: Diagnosis not present

## 2023-11-18 DIAGNOSIS — Z7984 Long term (current) use of oral hypoglycemic drugs: Secondary | ICD-10-CM

## 2023-11-18 DIAGNOSIS — Z823 Family history of stroke: Secondary | ICD-10-CM

## 2023-11-18 DIAGNOSIS — S72145A Nondisplaced intertrochanteric fracture of left femur, initial encounter for closed fracture: Principal | ICD-10-CM

## 2023-11-18 DIAGNOSIS — E1142 Type 2 diabetes mellitus with diabetic polyneuropathy: Secondary | ICD-10-CM | POA: Diagnosis present

## 2023-11-18 DIAGNOSIS — Z7902 Long term (current) use of antithrombotics/antiplatelets: Secondary | ICD-10-CM

## 2023-11-18 DIAGNOSIS — Z79899 Other long term (current) drug therapy: Secondary | ICD-10-CM

## 2023-11-18 DIAGNOSIS — I671 Cerebral aneurysm, nonruptured: Secondary | ICD-10-CM | POA: Diagnosis present

## 2023-11-18 DIAGNOSIS — E785 Hyperlipidemia, unspecified: Secondary | ICD-10-CM | POA: Diagnosis present

## 2023-11-18 DIAGNOSIS — I10 Essential (primary) hypertension: Secondary | ICD-10-CM | POA: Insufficient documentation

## 2023-11-18 DIAGNOSIS — Z794 Long term (current) use of insulin: Secondary | ICD-10-CM

## 2023-11-18 DIAGNOSIS — R29700 NIHSS score 0: Secondary | ICD-10-CM | POA: Diagnosis present

## 2023-11-18 DIAGNOSIS — Z888 Allergy status to other drugs, medicaments and biological substances status: Secondary | ICD-10-CM

## 2023-11-18 DIAGNOSIS — Z7982 Long term (current) use of aspirin: Secondary | ICD-10-CM

## 2023-11-18 DIAGNOSIS — E1122 Type 2 diabetes mellitus with diabetic chronic kidney disease: Secondary | ICD-10-CM | POA: Diagnosis present

## 2023-11-18 DIAGNOSIS — Z7985 Long-term (current) use of injectable non-insulin antidiabetic drugs: Secondary | ICD-10-CM

## 2023-11-18 DIAGNOSIS — G8194 Hemiplegia, unspecified affecting left nondominant side: Secondary | ICD-10-CM | POA: Diagnosis present

## 2023-11-18 DIAGNOSIS — F17210 Nicotine dependence, cigarettes, uncomplicated: Secondary | ICD-10-CM | POA: Diagnosis present

## 2023-11-18 DIAGNOSIS — Z91030 Bee allergy status: Secondary | ICD-10-CM

## 2023-11-18 DIAGNOSIS — S72002A Fracture of unspecified part of neck of left femur, initial encounter for closed fracture: Secondary | ICD-10-CM | POA: Diagnosis present

## 2023-11-18 DIAGNOSIS — Z885 Allergy status to narcotic agent status: Secondary | ICD-10-CM

## 2023-11-18 NOTE — ED Triage Notes (Signed)
 Pt says she was washing dishes, and the room started spinning, she fell down face forward, struck the top of her head on the fridge, no LOC. 6/10 headache now. Left hip pain. Denies neck pain.

## 2023-11-18 NOTE — ED Triage Notes (Signed)
 Pt arrives via [facility], per their report, the patient has been having dizzy spells for some time,  Got dizzy while in the kitchen tonight and fell. No LOC. She was able to crawl to get help. She has left hip pain with shortening and rotation. IV established in the right wrist. 50 Fentanyl  4 zofran  given en route, pain went from 7/10 to 5/10. Vitals en route 163/82, hr 86, 96%, cbg 295

## 2023-11-19 ENCOUNTER — Inpatient Hospital Stay

## 2023-11-19 ENCOUNTER — Emergency Department

## 2023-11-19 ENCOUNTER — Encounter
Admission: EM | Disposition: A | Discharge status: Rehabilitation Facility | Payer: Self-pay | Source: Home / Self Care | Attending: Internal Medicine

## 2023-11-19 ENCOUNTER — Inpatient Hospital Stay: Admitting: Certified Registered Nurse Anesthetist

## 2023-11-19 ENCOUNTER — Other Ambulatory Visit: Payer: Self-pay

## 2023-11-19 DIAGNOSIS — A499 Bacterial infection, unspecified: Secondary | ICD-10-CM | POA: Diagnosis not present

## 2023-11-19 DIAGNOSIS — F17210 Nicotine dependence, cigarettes, uncomplicated: Secondary | ICD-10-CM | POA: Diagnosis present

## 2023-11-19 DIAGNOSIS — I6340 Cerebral infarction due to embolism of unspecified cerebral artery: Secondary | ICD-10-CM | POA: Diagnosis not present

## 2023-11-19 DIAGNOSIS — E785 Hyperlipidemia, unspecified: Secondary | ICD-10-CM | POA: Insufficient documentation

## 2023-11-19 DIAGNOSIS — Z720 Tobacco use: Secondary | ICD-10-CM | POA: Diagnosis not present

## 2023-11-19 DIAGNOSIS — M25512 Pain in left shoulder: Secondary | ICD-10-CM | POA: Diagnosis not present

## 2023-11-19 DIAGNOSIS — S72002A Fracture of unspecified part of neck of left femur, initial encounter for closed fracture: Secondary | ICD-10-CM | POA: Diagnosis present

## 2023-11-19 DIAGNOSIS — N1832 Chronic kidney disease, stage 3b: Secondary | ICD-10-CM | POA: Diagnosis present

## 2023-11-19 DIAGNOSIS — Z66 Do not resuscitate: Secondary | ICD-10-CM | POA: Diagnosis present

## 2023-11-19 DIAGNOSIS — I10 Essential (primary) hypertension: Secondary | ICD-10-CM | POA: Insufficient documentation

## 2023-11-19 DIAGNOSIS — Z7982 Long term (current) use of aspirin: Secondary | ICD-10-CM | POA: Diagnosis not present

## 2023-11-19 DIAGNOSIS — I6349 Cerebral infarction due to embolism of other cerebral artery: Secondary | ICD-10-CM | POA: Diagnosis not present

## 2023-11-19 DIAGNOSIS — I129 Hypertensive chronic kidney disease with stage 1 through stage 4 chronic kidney disease, or unspecified chronic kidney disease: Secondary | ICD-10-CM | POA: Diagnosis present

## 2023-11-19 DIAGNOSIS — E1165 Type 2 diabetes mellitus with hyperglycemia: Secondary | ICD-10-CM | POA: Diagnosis not present

## 2023-11-19 DIAGNOSIS — Z885 Allergy status to narcotic agent status: Secondary | ICD-10-CM | POA: Diagnosis not present

## 2023-11-19 DIAGNOSIS — Z7984 Long term (current) use of oral hypoglycemic drugs: Secondary | ICD-10-CM | POA: Diagnosis not present

## 2023-11-19 DIAGNOSIS — I63429 Cerebral infarction due to embolism of unspecified anterior cerebral artery: Secondary | ICD-10-CM | POA: Diagnosis not present

## 2023-11-19 DIAGNOSIS — R29700 NIHSS score 0: Secondary | ICD-10-CM | POA: Diagnosis present

## 2023-11-19 DIAGNOSIS — I7090 Unspecified atherosclerosis: Secondary | ICD-10-CM

## 2023-11-19 DIAGNOSIS — Z7902 Long term (current) use of antithrombotics/antiplatelets: Secondary | ICD-10-CM | POA: Diagnosis not present

## 2023-11-19 DIAGNOSIS — Z716 Tobacco abuse counseling: Secondary | ICD-10-CM | POA: Diagnosis not present

## 2023-11-19 DIAGNOSIS — Z79899 Other long term (current) drug therapy: Secondary | ICD-10-CM | POA: Diagnosis not present

## 2023-11-19 DIAGNOSIS — S72112A Displaced fracture of greater trochanter of left femur, initial encounter for closed fracture: Secondary | ICD-10-CM | POA: Diagnosis present

## 2023-11-19 DIAGNOSIS — E1142 Type 2 diabetes mellitus with diabetic polyneuropathy: Secondary | ICD-10-CM | POA: Insufficient documentation

## 2023-11-19 DIAGNOSIS — S72145D Nondisplaced intertrochanteric fracture of left femur, subsequent encounter for closed fracture with routine healing: Secondary | ICD-10-CM | POA: Diagnosis not present

## 2023-11-19 DIAGNOSIS — Z823 Family history of stroke: Secondary | ICD-10-CM | POA: Diagnosis not present

## 2023-11-19 DIAGNOSIS — Z791 Long term (current) use of non-steroidal anti-inflammatories (NSAID): Secondary | ICD-10-CM | POA: Diagnosis not present

## 2023-11-19 DIAGNOSIS — M7989 Other specified soft tissue disorders: Secondary | ICD-10-CM | POA: Diagnosis not present

## 2023-11-19 DIAGNOSIS — Z7985 Long-term (current) use of injectable non-insulin antidiabetic drugs: Secondary | ICD-10-CM | POA: Diagnosis not present

## 2023-11-19 DIAGNOSIS — S72145A Nondisplaced intertrochanteric fracture of left femur, initial encounter for closed fracture: Secondary | ICD-10-CM | POA: Diagnosis not present

## 2023-11-19 DIAGNOSIS — Z91030 Bee allergy status: Secondary | ICD-10-CM | POA: Diagnosis not present

## 2023-11-19 DIAGNOSIS — Z794 Long term (current) use of insulin: Secondary | ICD-10-CM | POA: Diagnosis not present

## 2023-11-19 DIAGNOSIS — I63411 Cerebral infarction due to embolism of right middle cerebral artery: Secondary | ICD-10-CM | POA: Diagnosis not present

## 2023-11-19 DIAGNOSIS — I6389 Other cerebral infarction: Secondary | ICD-10-CM

## 2023-11-19 DIAGNOSIS — I6381 Other cerebral infarction due to occlusion or stenosis of small artery: Secondary | ICD-10-CM

## 2023-11-19 DIAGNOSIS — R42 Dizziness and giddiness: Secondary | ICD-10-CM | POA: Diagnosis present

## 2023-11-19 DIAGNOSIS — W19XXXA Unspecified fall, initial encounter: Secondary | ICD-10-CM | POA: Diagnosis present

## 2023-11-19 DIAGNOSIS — N390 Urinary tract infection, site not specified: Secondary | ICD-10-CM | POA: Diagnosis not present

## 2023-11-19 DIAGNOSIS — E1122 Type 2 diabetes mellitus with diabetic chronic kidney disease: Secondary | ICD-10-CM | POA: Diagnosis present

## 2023-11-19 DIAGNOSIS — Z888 Allergy status to other drugs, medicaments and biological substances status: Secondary | ICD-10-CM | POA: Diagnosis not present

## 2023-11-19 DIAGNOSIS — F09 Unspecified mental disorder due to known physiological condition: Secondary | ICD-10-CM | POA: Diagnosis not present

## 2023-11-19 DIAGNOSIS — I671 Cerebral aneurysm, nonruptured: Secondary | ICD-10-CM | POA: Diagnosis present

## 2023-11-19 DIAGNOSIS — G8194 Hemiplegia, unspecified affecting left nondominant side: Secondary | ICD-10-CM | POA: Diagnosis present

## 2023-11-19 HISTORY — PX: INTRAMEDULLARY (IM) NAIL INTERTROCHANTERIC: SHX5875

## 2023-11-19 LAB — BASIC METABOLIC PANEL WITH GFR
Anion gap: 11 (ref 5–15)
BUN: 14 mg/dL (ref 8–23)
CO2: 24 mmol/L (ref 22–32)
Calcium: 8.5 mg/dL — ABNORMAL LOW (ref 8.9–10.3)
Chloride: 105 mmol/L (ref 98–111)
Creatinine, Ser: 1.08 mg/dL — ABNORMAL HIGH (ref 0.44–1.00)
GFR, Estimated: 55 mL/min — ABNORMAL LOW (ref >60)
Glucose, Bld: 308 mg/dL — ABNORMAL HIGH (ref 70–99)
Potassium: 3.7 mmol/L (ref 3.5–5.1)
Sodium: 140 mmol/L (ref 135–145)

## 2023-11-19 LAB — COMPREHENSIVE METABOLIC PANEL WITH GFR
ALT: 12 U/L (ref 0–44)
AST: 25 U/L (ref 15–41)
Albumin: 3.3 g/dL — ABNORMAL LOW (ref 3.5–5.0)
Alkaline Phosphatase: 109 U/L (ref 38–126)
Anion gap: 15 (ref 5–15)
BUN: 14 mg/dL (ref 8–23)
CO2: 22 mmol/L (ref 22–32)
Calcium: 8.9 mg/dL (ref 8.9–10.3)
Chloride: 101 mmol/L (ref 98–111)
Creatinine, Ser: 1.15 mg/dL — ABNORMAL HIGH (ref 0.44–1.00)
GFR, Estimated: 51 mL/min — ABNORMAL LOW (ref >60)
Glucose, Bld: 370 mg/dL — ABNORMAL HIGH (ref 70–99)
Potassium: 4.2 mmol/L (ref 3.5–5.1)
Sodium: 138 mmol/L (ref 135–145)
Total Bilirubin: 0.6 mg/dL (ref 0.0–1.2)
Total Protein: 7.2 g/dL (ref 6.5–8.1)

## 2023-11-19 LAB — CBC
HCT: 36.1 % (ref 36.0–46.0)
HCT: 38.9 % (ref 36.0–46.0)
Hemoglobin: 11.3 g/dL — ABNORMAL LOW (ref 12.0–15.0)
Hemoglobin: 12.7 g/dL (ref 12.0–15.0)
MCH: 28.3 pg (ref 26.0–34.0)
MCH: 29.2 pg (ref 26.0–34.0)
MCHC: 31.3 g/dL (ref 30.0–36.0)
MCHC: 32.6 g/dL (ref 30.0–36.0)
MCV: 89.4 fL (ref 80.0–100.0)
MCV: 90.3 fL (ref 80.0–100.0)
Platelets: 173 K/uL (ref 150–400)
Platelets: 201 K/uL (ref 150–400)
RBC: 4 MIL/uL (ref 3.87–5.11)
RBC: 4.35 MIL/uL (ref 3.87–5.11)
RDW: 13.4 % (ref 11.5–15.5)
RDW: 13.5 % (ref 11.5–15.5)
WBC: 8.1 K/uL (ref 4.0–10.5)
WBC: 8.9 K/uL (ref 4.0–10.5)
nRBC: 0 % (ref 0.0–0.2)
nRBC: 0 % (ref 0.0–0.2)

## 2023-11-19 LAB — TYPE AND SCREEN
ABO/RH(D): A POS
Antibody Screen: NEGATIVE

## 2023-11-19 LAB — GLUCOSE, CAPILLARY
Glucose-Capillary: 305 mg/dL — ABNORMAL HIGH (ref 70–99)
Glucose-Capillary: 192 mg/dL — ABNORMAL HIGH (ref 70–99)
Glucose-Capillary: 184 mg/dL — ABNORMAL HIGH (ref 70–99)
Glucose-Capillary: 219 mg/dL — ABNORMAL HIGH (ref 70–99)
Glucose-Capillary: 391 mg/dL — ABNORMAL HIGH (ref 70–99)
Glucose-Capillary: 397 mg/dL — ABNORMAL HIGH (ref 70–99)

## 2023-11-19 LAB — TROPONIN I (HIGH SENSITIVITY)
Troponin I (High Sensitivity): 19 ng/L — ABNORMAL HIGH (ref <18)
Troponin I (High Sensitivity): 18 ng/L — ABNORMAL HIGH (ref <18)

## 2023-11-19 LAB — PROTIME-INR
INR: 0.9 (ref 0.8–1.2)
Prothrombin Time: 13.1 s (ref 11.4–15.2)

## 2023-11-19 SURGERY — FIXATION, FRACTURE, INTERTROCHANTERIC, WITH INTRAMEDULLARY ROD
Anesthesia: General | Site: Hip | Laterality: Left

## 2023-11-19 MED ORDER — VITAMIN B-12 1000 MCG PO TABS
1000.0000 ug | ORAL_TABLET | Freq: Every day | ORAL | Status: DC
  Administered 2023-11-20 – 2023-11-25 (×6): 1000 ug via ORAL
  Filled 2023-11-19 (×6): qty 1

## 2023-11-19 MED ORDER — TRAZODONE HCL 50 MG PO TABS
25.0000 mg | ORAL_TABLET | Freq: Every evening | ORAL | Status: DC | PRN
  Administered 2023-11-24: 25 mg via ORAL
  Filled 2023-11-19: qty 1

## 2023-11-19 MED ORDER — FENTANYL CITRATE (PF) 100 MCG/2ML IJ SOLN
INTRAMUSCULAR | Status: AC
  Filled 2023-11-19: qty 2

## 2023-11-19 MED ORDER — GABAPENTIN 300 MG PO CAPS
300.0000 mg | ORAL_CAPSULE | Freq: Once | ORAL | Status: AC
  Administered 2023-11-19: 300 mg via ORAL
  Filled 2023-11-19: qty 1

## 2023-11-19 MED ORDER — MIDAZOLAM HCL 2 MG/2ML IJ SOLN
INTRAMUSCULAR | Status: AC
Start: 2023-11-19 — End: 2023-11-19
  Filled 2023-11-19: qty 2

## 2023-11-19 MED ORDER — LIDOCAINE HCL (PF) 2 % IJ SOLN
INTRAMUSCULAR | Status: AC
  Filled 2023-11-19: qty 5

## 2023-11-19 MED ORDER — BUPROPION HCL ER (SR) 150 MG PO TB12
150.0000 mg | ORAL_TABLET | Freq: Two times a day (BID) | ORAL | Status: DC
  Administered 2023-11-19 – 2023-11-25 (×12): 150 mg via ORAL
  Filled 2023-11-19 (×13): qty 1

## 2023-11-19 MED ORDER — ADULT MULTIVITAMIN W/MINERALS CH
1.0000 | ORAL_TABLET | Freq: Every day | ORAL | Status: DC
  Administered 2023-11-19 – 2023-11-25 (×7): 1 via ORAL
  Filled 2023-11-19 (×7): qty 1

## 2023-11-19 MED ORDER — SUCCINYLCHOLINE CHLORIDE 200 MG/10ML IV SOSY
PREFILLED_SYRINGE | INTRAVENOUS | Status: DC | PRN
Start: 2023-11-19 — End: 2023-11-19
  Administered 2023-11-19: 30 mg via INTRAVENOUS

## 2023-11-19 MED ORDER — HYDROCODONE-ACETAMINOPHEN 5-325 MG PO TABS: 1.0000 | ORAL_TABLET | Freq: Four times a day (QID) | ORAL | Status: DC | PRN

## 2023-11-19 MED ORDER — PHENOL 1.4 % MT LIQD
1.0000 | OROMUCOSAL | Status: DC | PRN
Start: 2023-11-19 — End: 2023-11-25

## 2023-11-19 MED ORDER — PROPOFOL 10 MG/ML IV BOLUS
INTRAVENOUS | Status: AC
  Filled 2023-11-19: qty 20

## 2023-11-19 MED ORDER — OXYCODONE HCL 5 MG PO TABS
5.0000 mg | ORAL_TABLET | ORAL | Status: DC | PRN
  Administered 2023-11-19 – 2023-11-25 (×10): 5 mg via ORAL
  Filled 2023-11-19 (×11): qty 1

## 2023-11-19 MED ORDER — ONDANSETRON HCL 4 MG/2ML IJ SOLN
INTRAMUSCULAR | Status: DC | PRN
  Administered 2023-11-19: 4 mg via INTRAVENOUS

## 2023-11-19 MED ORDER — TRAMADOL HCL 50 MG PO TABS
50.0000 mg | ORAL_TABLET | Freq: Four times a day (QID) | ORAL | Status: DC | PRN
  Administered 2023-11-20 – 2023-11-25 (×7): 50 mg via ORAL
  Filled 2023-11-19 (×7): qty 1

## 2023-11-19 MED ORDER — SUGAMMADEX SODIUM 200 MG/2ML IV SOLN
INTRAVENOUS | Status: DC | PRN
  Administered 2023-11-19: 200 mg via INTRAVENOUS

## 2023-11-19 MED ORDER — ACETAMINOPHEN 10 MG/ML IV SOLN
INTRAVENOUS | Status: DC | PRN
  Administered 2023-11-19: 1000 mg via INTRAVENOUS

## 2023-11-19 MED ORDER — SODIUM CHLORIDE 0.9 % IV SOLN: INTRAVENOUS | Status: DC

## 2023-11-19 MED ORDER — 0.9 % SODIUM CHLORIDE (POUR BTL) OPTIME
TOPICAL | Status: DC | PRN
  Administered 2023-11-19: 500 mL

## 2023-11-19 MED ORDER — ACETAMINOPHEN 10 MG/ML IV SOLN
INTRAVENOUS | Status: AC
  Filled 2023-11-19: qty 100

## 2023-11-19 MED ORDER — LACTATED RINGERS IV SOLN: INTRAVENOUS | Status: DC

## 2023-11-19 MED ORDER — PHENYLEPHRINE 80 MCG/ML (10ML) SYRINGE FOR IV PUSH (FOR BLOOD PRESSURE SUPPORT)
PREFILLED_SYRINGE | INTRAVENOUS | Status: DC | PRN
  Administered 2023-11-19 (×3): 80 ug via INTRAVENOUS

## 2023-11-19 MED ORDER — DOCUSATE SODIUM 100 MG PO CAPS
100.0000 mg | ORAL_CAPSULE | Freq: Two times a day (BID) | ORAL | Status: DC
  Administered 2023-11-19 – 2023-11-25 (×11): 100 mg via ORAL
  Filled 2023-11-19 (×12): qty 1

## 2023-11-19 MED ORDER — GLUCERNA SHAKE PO LIQD
237.0000 mL | Freq: Three times a day (TID) | ORAL | Status: DC
  Administered 2023-11-22 – 2023-11-24 (×6): 237 mL via ORAL

## 2023-11-19 MED ORDER — FENTANYL CITRATE (PF) 100 MCG/2ML IJ SOLN
50.0000 ug | INTRAMUSCULAR | Status: DC | PRN
  Administered 2023-11-19: 50 ug via INTRAVENOUS

## 2023-11-19 MED ORDER — PROPOFOL 10 MG/ML IV BOLUS
INTRAVENOUS | Status: DC | PRN
  Administered 2023-11-19: 100 mg via INTRAVENOUS

## 2023-11-19 MED ORDER — ACETAMINOPHEN 325 MG PO TABS
325.0000 mg | ORAL_TABLET | Freq: Four times a day (QID) | ORAL | Status: DC | PRN
  Administered 2023-11-21 (×2): 650 mg via ORAL
  Filled 2023-11-19 (×2): qty 2

## 2023-11-19 MED ORDER — METOCLOPRAMIDE HCL 5 MG/ML IJ SOLN: 5.0000 mg | Freq: Three times a day (TID) | INTRAMUSCULAR | Status: DC | PRN

## 2023-11-19 MED ORDER — ONDANSETRON HCL 4 MG/2ML IJ SOLN
4.0000 mg | INTRAMUSCULAR | Status: DC | PRN
Start: 2023-11-19 — End: 2023-11-19

## 2023-11-19 MED ORDER — ONDANSETRON HCL 4 MG/2ML IJ SOLN
4.0000 mg | Freq: Once | INTRAMUSCULAR | Status: AC
  Administered 2023-11-19: 4 mg via INTRAVENOUS
  Filled 2023-11-19: qty 2

## 2023-11-19 MED ORDER — ONDANSETRON HCL 4 MG/2ML IJ SOLN: 4.0000 mg | Freq: Four times a day (QID) | INTRAMUSCULAR | Status: DC | PRN

## 2023-11-19 MED ORDER — FENTANYL CITRATE PF 50 MCG/ML IJ SOSY
25.0000 ug | PREFILLED_SYRINGE | INTRAMUSCULAR | Status: DC | PRN
  Administered 2023-11-19: 25 ug via INTRAVENOUS
  Filled 2023-11-19: qty 1

## 2023-11-19 MED ORDER — CEFAZOLIN SODIUM-DEXTROSE 2-3 GM-%(50ML) IV SOLR
INTRAVENOUS | Status: DC | PRN
  Administered 2023-11-19: 2 g via INTRAVENOUS

## 2023-11-19 MED ORDER — MORPHINE SULFATE (PF) 2 MG/ML IV SOLN
0.5000 mg | INTRAVENOUS | Status: DC | PRN
  Administered 2023-11-20 – 2023-11-21 (×2): 1 mg via INTRAVENOUS
  Filled 2023-11-19 (×3): qty 1

## 2023-11-19 MED ORDER — SODIUM CHLORIDE 0.9 % IV BOLUS (SEPSIS)
500.0000 mL | Freq: Once | INTRAVENOUS | Status: AC
  Administered 2023-11-19: 500 mL via INTRAVENOUS

## 2023-11-19 MED ORDER — MENTHOL 3 MG MT LOZG
1.0000 | LOZENGE | OROMUCOSAL | Status: DC | PRN
Start: 2023-11-19 — End: 2023-11-25

## 2023-11-19 MED ORDER — ALBUTEROL SULFATE HFA 108 (90 BASE) MCG/ACT IN AERS
INHALATION_SPRAY | RESPIRATORY_TRACT | Status: AC
Start: 2023-11-19 — End: 2023-11-19
  Filled 2023-11-19: qty 6.7

## 2023-11-19 MED ORDER — TRANEXAMIC ACID-NACL 1000-0.7 MG/100ML-% IV SOLN
INTRAVENOUS | Status: DC | PRN
  Administered 2023-11-19: 1000 mg via INTRAVENOUS

## 2023-11-19 MED ORDER — GABAPENTIN 400 MG PO CAPS
800.0000 mg | ORAL_CAPSULE | Freq: Three times a day (TID) | ORAL | Status: DC
  Administered 2023-11-19 – 2023-11-25 (×18): 800 mg via ORAL
  Filled 2023-11-19 (×18): qty 2

## 2023-11-19 MED ORDER — ENOXAPARIN SODIUM 40 MG/0.4ML IJ SOSY
40.0000 mg | PREFILLED_SYRINGE | INTRAMUSCULAR | Status: DC
  Administered 2023-11-20 – 2023-11-25 (×6): 40 mg via SUBCUTANEOUS
  Filled 2023-11-19 (×6): qty 0.4

## 2023-11-19 MED ORDER — OYSTER SHELL CALCIUM/D3 500-5 MG-MCG PO TABS
1.0000 | ORAL_TABLET | Freq: Every day | ORAL | Status: DC
  Administered 2023-11-20 – 2023-11-25 (×6): 1 via ORAL
  Filled 2023-11-19 (×6): qty 1

## 2023-11-19 MED ORDER — ALBUTEROL SULFATE HFA 108 (90 BASE) MCG/ACT IN AERS
INHALATION_SPRAY | RESPIRATORY_TRACT | Status: DC | PRN
Start: 2023-11-19 — End: 2023-11-19
  Administered 2023-11-19: 4 via RESPIRATORY_TRACT

## 2023-11-19 MED ORDER — MECLIZINE HCL 25 MG PO TABS
25.0000 mg | ORAL_TABLET | Freq: Once | ORAL | Status: AC
  Administered 2023-11-19: 25 mg via ORAL
  Filled 2023-11-19: qty 1

## 2023-11-19 MED ORDER — ONDANSETRON HCL 4 MG PO TABS: 4.0000 mg | ORAL_TABLET | Freq: Four times a day (QID) | ORAL | Status: DC | PRN

## 2023-11-19 MED ORDER — MORPHINE SULFATE (PF) 4 MG/ML IV SOLN
4.0000 mg | Freq: Once | INTRAVENOUS | Status: AC
  Administered 2023-11-19: 4 mg via INTRAVENOUS
  Filled 2023-11-19: qty 1

## 2023-11-19 MED ORDER — PREGABALIN 50 MG PO CAPS: 100.0000 mg | ORAL_CAPSULE | Freq: Two times a day (BID) | ORAL | Status: DC

## 2023-11-19 MED ORDER — ORAL CARE MOUTH RINSE: 15.0000 mL | OROMUCOSAL | Status: DC | PRN

## 2023-11-19 MED ORDER — BUPIVACAINE-EPINEPHRINE (PF) 0.25% -1:200000 IJ SOLN
INTRAMUSCULAR | Status: DC | PRN
  Administered 2023-11-19: 20 mL via PERINEURAL

## 2023-11-19 MED ORDER — LIDOCAINE HCL (CARDIAC) PF 100 MG/5ML IV SOSY
PREFILLED_SYRINGE | INTRAVENOUS | Status: DC | PRN
Start: 2023-11-19 — End: 2023-11-19
  Administered 2023-11-19: 80 mg via INTRAVENOUS

## 2023-11-19 MED ORDER — LISINOPRIL 20 MG PO TABS
40.0000 mg | ORAL_TABLET | Freq: Every day | ORAL | Status: DC
  Administered 2023-11-20 – 2023-11-25 (×6): 40 mg via ORAL
  Filled 2023-11-19 (×6): qty 2

## 2023-11-19 MED ORDER — MORPHINE SULFATE (PF) 2 MG/ML IV SOLN
2.0000 mg | INTRAVENOUS | Status: DC | PRN
  Administered 2023-11-19 – 2023-11-24 (×7): 2 mg via INTRAVENOUS
  Filled 2023-11-19 (×7): qty 1

## 2023-11-19 MED ORDER — CEFAZOLIN SODIUM-DEXTROSE 2-4 GM/100ML-% IV SOLN
2.0000 g | Freq: Four times a day (QID) | INTRAVENOUS | Status: AC
  Administered 2023-11-19 – 2023-11-20 (×2): 2 g via INTRAVENOUS
  Filled 2023-11-19 (×2): qty 100

## 2023-11-19 MED ORDER — FENTANYL CITRATE PF 50 MCG/ML IJ SOSY: 25.0000 ug | PREFILLED_SYRINGE | INTRAMUSCULAR | Status: DC | PRN

## 2023-11-19 MED ORDER — ATORVASTATIN CALCIUM 10 MG PO TABS: 10.0000 mg | ORAL_TABLET | Freq: Every day | ORAL | Status: DC

## 2023-11-19 MED ORDER — TRANEXAMIC ACID-NACL 1000-0.7 MG/100ML-% IV SOLN
INTRAVENOUS | Status: AC
  Filled 2023-11-19: qty 100

## 2023-11-19 MED ORDER — BUPIVACAINE-EPINEPHRINE (PF) 0.25% -1:200000 IJ SOLN
INTRAMUSCULAR | Status: AC
Start: 2023-11-19 — End: 2023-11-19
  Filled 2023-11-19: qty 30

## 2023-11-19 MED ORDER — IOHEXOL 350 MG/ML SOLN
75.0000 mL | Freq: Once | INTRAVENOUS | Status: AC | PRN
  Administered 2023-11-19: 75 mL via INTRAVENOUS

## 2023-11-19 MED ORDER — INSULIN ASPART 100 UNIT/ML IJ SOLN
0.0000 [IU] | INTRAMUSCULAR | Status: DC
  Administered 2023-11-19: 9 [IU] via SUBCUTANEOUS
  Administered 2023-11-19: 7 [IU] via SUBCUTANEOUS
  Administered 2023-11-20: 9 [IU] via SUBCUTANEOUS
  Administered 2023-11-20: 3 [IU] via SUBCUTANEOUS
  Administered 2023-11-20: 2 [IU] via SUBCUTANEOUS
  Administered 2023-11-20: 5 [IU] via SUBCUTANEOUS
  Administered 2023-11-20: 3 [IU] via SUBCUTANEOUS
  Administered 2023-11-20: 2 [IU] via SUBCUTANEOUS
  Administered 2023-11-21: 3 [IU] via SUBCUTANEOUS
  Administered 2023-11-21: 5 [IU] via SUBCUTANEOUS
  Administered 2023-11-21: 7 [IU] via SUBCUTANEOUS
  Administered 2023-11-21: 5 [IU] via SUBCUTANEOUS
  Administered 2023-11-21: 2 [IU] via SUBCUTANEOUS
  Administered 2023-11-21: 3 [IU] via SUBCUTANEOUS
  Administered 2023-11-22: 7 [IU] via SUBCUTANEOUS
  Administered 2023-11-22 (×2): 2 [IU] via SUBCUTANEOUS
  Administered 2023-11-22: 7 [IU] via SUBCUTANEOUS
  Administered 2023-11-22: 5 [IU] via SUBCUTANEOUS
  Administered 2023-11-23: 7 [IU] via SUBCUTANEOUS
  Administered 2023-11-23: 1 [IU] via SUBCUTANEOUS
  Administered 2023-11-23: 3 [IU] via SUBCUTANEOUS
  Administered 2023-11-23: 9 [IU] via SUBCUTANEOUS
  Administered 2023-11-23 – 2023-11-24 (×2): 5 [IU] via SUBCUTANEOUS
  Administered 2023-11-24: 1 [IU] via SUBCUTANEOUS
  Administered 2023-11-24 (×2): 2 [IU] via SUBCUTANEOUS
  Administered 2023-11-24: 5 [IU] via SUBCUTANEOUS
  Administered 2023-11-24 – 2023-11-25 (×2): 3 [IU] via SUBCUTANEOUS
  Administered 2023-11-25: 2 [IU] via SUBCUTANEOUS
  Administered 2023-11-25: 5 [IU] via SUBCUTANEOUS
  Administered 2023-11-25: 1 [IU] via SUBCUTANEOUS
  Filled 2023-11-19 (×33): qty 1

## 2023-11-19 MED ORDER — CEFAZOLIN SODIUM-DEXTROSE 2-4 GM/100ML-% IV SOLN
INTRAVENOUS | Status: AC
  Filled 2023-11-19: qty 100

## 2023-11-19 MED ORDER — CARVEDILOL 25 MG PO TABS
25.0000 mg | ORAL_TABLET | Freq: Two times a day (BID) | ORAL | Status: DC
Start: 2023-11-19 — End: 2023-11-25
  Administered 2023-11-19 – 2023-11-25 (×12): 25 mg via ORAL
  Filled 2023-11-19 (×12): qty 1

## 2023-11-19 MED ORDER — ACETAMINOPHEN 325 MG PO TABS: 650.0000 mg | ORAL_TABLET | ORAL | Status: DC | PRN

## 2023-11-19 MED ORDER — METOCLOPRAMIDE HCL 5 MG PO TABS: 5.0000 mg | ORAL_TABLET | Freq: Three times a day (TID) | ORAL | Status: DC | PRN

## 2023-11-19 MED ORDER — ACETAMINOPHEN 10 MG/ML IV SOLN: 1000.0000 mg | Freq: Once | INTRAVENOUS | Status: DC | PRN

## 2023-11-19 MED ORDER — FENTANYL CITRATE PF 50 MCG/ML IJ SOSY
50.0000 ug | PREFILLED_SYRINGE | Freq: Once | INTRAMUSCULAR | Status: AC
  Administered 2023-11-19: 50 ug via INTRAVENOUS
  Filled 2023-11-19: qty 1

## 2023-11-19 MED ORDER — ROCURONIUM BROMIDE 100 MG/10ML IV SOLN
INTRAVENOUS | Status: DC | PRN
  Administered 2023-11-19: 30 mg via INTRAVENOUS

## 2023-11-19 MED ORDER — FENTANYL CITRATE (PF) 100 MCG/2ML IJ SOLN
INTRAMUSCULAR | Status: DC | PRN
  Administered 2023-11-19: 25 ug via INTRAVENOUS
  Administered 2023-11-19: 50 ug via INTRAVENOUS
  Administered 2023-11-19: 25 ug via INTRAVENOUS

## 2023-11-19 MED ORDER — DEXAMETHASONE SODIUM PHOSPHATE 10 MG/ML IJ SOLN
INTRAMUSCULAR | Status: DC | PRN
  Administered 2023-11-19: 5 mg via INTRAVENOUS

## 2023-11-19 SURGICAL SUPPLY — 41 items
BIT DRILL CALIBRATED 4.2 (BIT) IMPLANT
BIT DRILL CANN 16 HIP (BIT) IMPLANT
BIT DRILL CANN STP 6/9 HIP (BIT) IMPLANT
BIT DRILL TAPERED 10 (BIT) IMPLANT
BLADE TFNA HELICAL 85 (Anchor) IMPLANT
BNDG COHESIVE 6X5 TAN ST LF (GAUZE/BANDAGES/DRESSINGS) ×2 IMPLANT
CHLORAPREP W/TINT 26 (MISCELLANEOUS) ×1 IMPLANT
DERMABOND ADVANCED .7 DNX12 (GAUZE/BANDAGES/DRESSINGS) ×1 IMPLANT
DRAPE C-ARM XRAY 36X54 (DRAPES) ×1 IMPLANT
DRAPE C-ARMOR (DRAPES) IMPLANT
DRAPE SHEET LG 3/4 BI-LAMINATE (DRAPES) ×1 IMPLANT
DRSG OPSITE POSTOP 3X4 (GAUZE/BANDAGES/DRESSINGS) IMPLANT
DRSG OPSITE POSTOP 4X6 (GAUZE/BANDAGES/DRESSINGS) IMPLANT
ELECT CAUTERY BLADE 6.4 (BLADE) IMPLANT
ELECTRODE REM PT RTRN 9FT ADLT (ELECTROSURGICAL) IMPLANT
GLOVE PI ORTHO PRO STRL 7.5 (GLOVE) ×2 IMPLANT
GLOVE SURG SYN 7.5 PF PI (GLOVE) ×1 IMPLANT
GOWN SRG XL LVL 3 NONREINFORCE (GOWNS) ×1 IMPLANT
GOWN STRL REUS W/ TWL LRG LVL3 (GOWN DISPOSABLE) ×1 IMPLANT
GUIDEWIRE 3.2X400 (WIRE) IMPLANT
HANDLE YANKAUER SUCT OPEN TIP (MISCELLANEOUS) ×1 IMPLANT
KIT PATIENT CARE HANA TABLE (KITS) ×1 IMPLANT
KIT TURNOVER CYSTO (KITS) ×1 IMPLANT
MANIFOLD NEPTUNE II (INSTRUMENTS) ×1 IMPLANT
MAT ABSORB FLUID 56X50 GRAY (MISCELLANEOUS) ×1 IMPLANT
NAIL CANN TFNA 9MM/130 DEG TI (Nail) IMPLANT
NDL HYPO 21X1.5 SAFETY (NEEDLE) ×1 IMPLANT
NEEDLE HYPO 21X1.5 SAFETY (NEEDLE) ×1 IMPLANT
NS IRRIG 500ML POUR BTL (IV SOLUTION) ×1 IMPLANT
PACK HIP COMPR (MISCELLANEOUS) ×1 IMPLANT
PAD ARMBOARD POSITIONER FOAM (MISCELLANEOUS) ×1 IMPLANT
PENCIL SMOKE EVACUATOR (MISCELLANEOUS) ×1 IMPLANT
SCREW LOCK STAR 5X36 (Screw) IMPLANT
SLEEVE SCD COMPRESS KNEE MED (STOCKING) ×1 IMPLANT
SUT VIC AB 1 CT1 36 (SUTURE) ×1 IMPLANT
SUT VIC AB 2-0 CT2 27 (SUTURE) ×1 IMPLANT
SUTURE STRATA SPIR 4-0 18 (SUTURE) ×1 IMPLANT
SYR 20ML LL LF (SYRINGE) ×1 IMPLANT
TAPE MICROFOAM 4IN (TAPE) IMPLANT
TRAP FLUID SMOKE EVACUATOR (MISCELLANEOUS) IMPLANT
WATER STERILE IRR 1000ML POUR (IV SOLUTION) ×1 IMPLANT

## 2023-11-19 NOTE — Assessment & Plan Note (Signed)
-   The patient will be placed on supplemental coverage with NovoLog . - Will continue basal coverage. - Will hold off metformin and Trulicity. - Will continue Neurontin .

## 2023-11-19 NOTE — Progress Notes (Signed)
 Progress Note   Patient: [name] FMW:[id number] DOB: 03-10-1951 DOA: 11/18/2023     0 DOS: the patient was seen and examined on 11/19/2023   Brief hospital course: From HPI Xuan Jerzi Tigert is a 73 y.o. female with medical history significant for type diabetes mellitus, essential hypertension and dyslipidemia and intracranial aneurysm for which she declined intervention in 2023 at [facility], who presented to the emergency room with acute onset of vertigo with subsequent fall while washing her dishes and manage left hip pain.  She denies any presyncope or syncope.  She denies any bleeding diathesis.  No chest pain or palpitations.  No paresthesias or focal muscle weakness.  No witnessed seizures.  No nausea or vomiting or abdominal pain.  No cough or wheezing or dyspnea.  No chest pain or palpitations.   ED Course: When she came to the ER, BP was 157/73 with temperature 97.1 with otherwise normal vital signs.  Labs revealed a blood glucose of 370 with otherwise albumin of 3.3 and creatinine 1.15.  High-sensitivity troponin was 19 and later 18 and CBC was normal.  Blood group was A+ with negative antibody screen.  PT and INR were normal. EKG as reviewed by me : Normal sinus rhythm with rate of 81. Imaging: Noncontrasted CT scan showed marked severe multilevel degenerative changes without evidence of an acute fracture or subluxation. Chest x-ray showed no acute cardiopulmonary disease.  Hip and pelvic x-ray revealed no acute osseous abnormality. Left hip CT scan showed acute nondisplaced intertrochanteric fracture of the proximal left femur.    Assessment and Plan: Closed left hip fracture, initial encounter [facility]) - The patient will be admitted to a telemetry bed. - Continue NPO. - Pain management will be provided. - The bed consult to be obtained. - Dr. Lorelle was notified about the patient    Acute ischemic stroke I have consulted neurologist Currently n.p.o. pending surgery I have  ordered CTA head and neck as well as echo which is pending   Tobacco abuse She was counseled for smoking cessation and will receive further counseling here.   Dyslipidemia - Will continue statin therapy.   Type 2 diabetes mellitus with peripheral neuropathy (HCC) - The patient will be placed on supplemental coverage with NovoLog . - Will continue basal coverage. - Will hold off metformin and Trulicity. - Will continue Neurontin .   Essential hypertension We will continue antihypertensive therapy.     DVT prophylaxis: SCDs.  Advanced Care Planning:  Code Status: The patient is DNR only. Family Communication: No family at bedside  Disposition Plan: Back to previous home environment   Subjective:  Patient seen and examined at bedside this morning Denies nausea vomiting abdominal pain chest pain cough  Physical Exam: GENERAL: elderly female laying in bed in no acute distress LUNGS: Normal breath sounds bilaterally CARDIOVASCULAR: Regular rate and rhythm ABDOMEN: Soft, nondistended, nontender. Bowel sounds present. No organomegaly or mass.  EXTREMITIES: No pedal edema, cyanosis, or clubbing. Musculoskeletal: Left lateral hip tenderness. NEUROLOGIC: Cranial nerves II through XII are intact. Muscle strength 5/5 in all extremities. Sensation intact. Gait not checked.  PSYCHIATRIC: The patient is alert and oriented x 3.  Normal affect and good eye contact. SKIN: No obvious rash, lesion, or ulcer.  Vitals:   11/19/23 1515 11/19/23 1522 11/19/23 1530 11/19/23 1541  BP: (!) 155/78  (!) 148/79 136/70  Pulse: 88 83 76 78  Resp: 11 10 14 11   Temp:    98.5 F (36.9 C)  TempSrc:  SpO2: 95% 92% 98% 98%  Weight:      Height:        Data Reviewed: Chest x-ray reviewed showing stable cardiomegaly    Latest Ref Rng & Units 11/19/2023    6:35 AM 11/18/2023   11:41 PM 10/24/2023    4:13 PM  CBC  WBC 4.0 - 10.5 K/uL 8.1  8.9  10.8   Hemoglobin 12.0 - 15.0 g/dL 88.6  87.2  85.8    Hematocrit 36.0 - 46.0 % 36.1  38.9  43.0   Platelets 150 - 400 K/uL 173  201  213        Latest Ref Rng & Units 11/19/2023    6:35 AM 11/18/2023   11:41 PM 10/24/2023    4:13 PM  BMP  Glucose 70 - 99 mg/dL 691  629  640   BUN 8 - 23 mg/dL 14  14  36   Creatinine 0.44 - 1.00 mg/dL 8.91  8.84  8.10   Sodium 135 - 145 mmol/L 140  138  137   Potassium 3.5 - 5.1 mmol/L 3.7  4.2  3.6   Chloride 98 - 111 mmol/L 105  101  98   CO2 22 - 32 mmol/L 24  22  24    Calcium  8.9 - 10.3 mg/dL 8.5  8.9  9.1       Time spent: 53 minutes  Author: Drue ONEIDA Potter, MD 11/19/2023 3:59 PM  For on call review [email or web address].

## 2023-11-19 NOTE — Consult Note (Signed)
 ORTHOPAEDIC CONSULTATION  REQUESTING PHYSICIAN: Dorinda Drue DASEN, MD  Chief Complaint:   Left intertrochanteric femur fracture  History of Present Illness: [name] is a 73 y.o. female  with medical history significant for type diabetes mellitus, essential hypertension and dyslipidemia and intracranial aneurysm for which she declined intervention in 2023 at [facility], who presented to the emergency room with acute onset of vertigo with subsequent fall while washing her dishes and manage left hip pain.  Patient denies any pre-existing left hip pain prior to the fall.  She reports she did not hit her head or lose consciousness with the fall.  She landed on her left side and was unable to get up after the fall and was brought to emergency room for evaluation.  Denies any numbness or tingling in the leg.  Denies any shortness of breath chest pain or headache at this time.  Past Medical History:  Diagnosis Date   Diabetes mellitus without complication (HCC)    Past Surgical History:  Procedure Laterality Date   IR ANGIO INTRA EXTRACRAN SEL INTERNAL CAROTID BILAT MOD SED  07/05/2021   IR ANGIO VERTEBRAL SEL VERTEBRAL UNI R MOD SED  07/05/2021   IR RADIOLOGIST EVAL & MGMT  07/16/2021   IR US  GUIDE VASC ACCESS RIGHT  07/05/2021   Social History   Socioeconomic History   Marital status: Married    Spouse name: Not on file   Number of children: Not on file   Years of education: Not on file   Highest education level: Not on file  Occupational History   Not on file  Tobacco Use   Smoking status: Every Day    Current packs/day: 0.50    Average packs/day: 0.5 packs/day for 50.7 years (25.3 ttl pk-yrs)    Types: Cigarettes    Start date: 88    Passive exposure: Never   Smokeless tobacco: Never  Substance and Sexual Activity   Alcohol use: Not Currently   Drug use: Never   Sexual activity: Not on file  Other Topics Concern    Not on file  Social History Narrative   Not on file   Social Drivers of Health   Financial Resource Strain: Not on file  Food Insecurity: Food Insecurity Present (11/19/2023)   Hunger Vital Sign    Worried About Running Out of Food in the Last Year: Sometimes true    Ran Out of Food in the Last Year: Never true  Transportation Needs: No Transportation Needs (11/19/2023)   PRAPARE - Administrator, Civil Service (Medical): No    Lack of Transportation (Non-Medical): No  Physical Activity: Not on file  Stress: Not on file  Social Connections: Moderately Integrated (11/19/2023)   Social Connection and Isolation Panel    Frequency of Communication with Friends and Family: Once a week    Frequency of Social Gatherings with Friends and Family: Once a week    Attends Religious Services: 1 to 4 times per year    Active Member of [employer] or Organizations: No    Attends Banker Meetings: 1 to 4 times per year    Marital Status: Married   History reviewed. No pertinent family history. Allergies  Allergen Reactions   Phenylmercuric Nitrate Other (See Comments)    BLISTERS   Prior to Admission medications   Medication Sig Start Date End Date Taking? Authorizing Provider  Calcium  Carb-Cholecalciferol  (CALCIUM  + VITAMIN D3 PO) Take 1 tablet by mouth daily.   Yes [provider]  gabapentin  (NEURONTIN ) 800 MG tablet Take 800 mg by mouth 3 (three) times daily.   Yes [provider]  metFORMIN (GLUCOPHAGE-XR) 500 MG 24 hr tablet Take 500 mg by mouth daily with breakfast.   Yes [provider]  amLODipine  (NORVASC ) 5 MG tablet Take 5 mg by mouth daily. Patient not taking: Reported on 11/19/2023    [provider]  aspirin  EC 81 MG tablet Take 81 mg by mouth daily. Swallow whole.    [provider]  atorvastatin  (LIPITOR) 10 MG tablet Take 10 mg by mouth daily. Patient not taking: Reported on 11/19/2023    [provider]   buPROPion  (ZYBAN ) 150 MG 12 hr tablet Take 150 mg by mouth 2 (two) times daily. Patient not taking: Reported on 11/19/2023 05/07/21   [provider]  carvedilol  (COREG ) 25 MG tablet Take 25 mg by mouth 2 (two) times daily with a meal. Patient not taking: Reported on 11/19/2023    [provider]  insulin  degludec (TRESIBA ) 100 UNIT/ML FlexTouch Pen Inject 40 Units into the skin daily. Patient not taking: Reported on 11/19/2023 05/27/21   Demaio, Alexa, MD  lisinopril  (ZESTRIL ) 40 MG tablet Take 40 mg by mouth daily.    [provider]  LYRICA  100 MG capsule Take 100 mg by mouth 2 (two) times daily. 05/10/21   [provider]  meloxicam (MOBIC) 15 MG tablet Take 15 mg by mouth daily. Patient not taking: Reported on 11/19/2023 05/07/21   [provider]  traMADol  (ULTRAM ) 50 MG tablet Take 1 tablet (50 mg total) by mouth every 12 (twelve) hours as needed. Patient not taking: Reported on 11/19/2023 03/01/23 02/29/24  Poggi, Jenna E, PA-C  TRULICITY 0.75 MG/0.5ML SOPN Inject 0.75 mg into the skin once a week. 05/10/21   [provider]  vitamin B-12 (CYANOCOBALAMIN ) 1000 MCG tablet Take 1,000 mcg by mouth daily.    [provider]   MR HIP LEFT WO CONTRAST Result Date: 11/19/2023 CLINICAL DATA:  Fall, intertrochanteric fracture EXAM: MR OF THE LEFT HIP WITHOUT CONTRAST TECHNIQUE: Multiplanar, multisequence MR imaging was performed. No intravenous contrast was administered. COMPARISON:  CT scan 11/17/2023 FINDINGS: Bones: MRI confirms the left intertrochanteric fracture, with high visibility of the fracture plane along the greater trochanter and mid intertrochanteric region, reduced visibility of lesser trochanteric extension. This extension is best seen extending along the proximal margin of the lesser trochanter posteriorly for example on image 18 series 3. No other fracture identified. Accentuated T2 signal in the pubic symphysis. Articular cartilage  and labrum Articular cartilage:  Moderate degenerative chondral thinning Labrum:  Grossly intact. Joint or bursal effusion Joint effusion: Upper normal amount of fluid in the left hip joint without overt effusion. Bursae: Mild trochanteric bursitis. Muscles and tendons Muscles and tendons: Edema in the quadratus femoris muscle, image 25 series 14. Chronic appearing partial tearing of the right hamstring tendon. Mild edema tracks around the distal iliopsoas tendon. Other findings Miscellaneous: Lower lumbar spondylosis and degenerative disc disease. IMPRESSION: 1. MRI confirms the left intertrochanteric fracture, with high visibility of the fracture plane along the greater trochanter and mid intertrochanteric region, reduced visibility of lesser trochanteric extension which is best seen posteriorly. 2. Mild trochanteric bursitis. 3. Edema in the quadratus femoris muscle. 4. Chronic appearing partial tearing of the right (contralateral) hamstring tendon. 5. Lower lumbar spondylosis and degenerative disc disease. Electronically Signed   By: Ryan Salvage M.D.   On: 11/19/2023 09:25   MR  BRAIN WO CONTRAST Result Date: 11/19/2023 CLINICAL DATA:  Neuro deficit, acute, stroke suspected EXAM: MRI HEAD WITHOUT CONTRAST TECHNIQUE: Multiplanar, multiecho pulse sequences of the brain and surrounding structures were obtained without intravenous contrast. COMPARISON:  CT head 11/19/2023. FINDINGS: Brain: Acute infarct in the posterior limb of the right internal capsule superiorly. Possible punctate acute infarct in the left middle cerebellar peduncle versus artifact (see series 5, image 15). Remote lacunar infarcts in the pons, right corona radiata and left basal ganglia. Scattered T2/FLAIR hyperintensities the white matter, compatible with chronic microvascular ischemic disease. No acute hemorrhage, mass lesion, midline shift or hydrocephalus. Cerebral atrophy. Evidence of prior hemorrhage in the inferior left basal  ganglia. Vascular: Major arterial flow voids are maintained at the skull base. Skull and upper cervical spine: Normal marrow signal. Sinuses/Orbits: Negative. Other: No mastoid effusions. IMPRESSION: 1. Acute infarct in the posterior limb of the right internal capsule superiorly. 2. Possible punctate acute infarct in the left middle cerebellar peduncle versus artifact. 3. Remote lacunar infarcts in the pons, left basal ganglia and right corona radiata. Electronically Signed   By: Gilmore GORMAN Molt M.D.   On: 11/19/2023 03:39   DG Chest Portable 1 View Result Date: 11/19/2023 CLINICAL DATA:  Left hip fracture sent for preoperative evaluation. EXAM: PORTABLE CHEST 1 VIEW COMPARISON:  March 01, 2023 FINDINGS: The cardiac silhouette is enlarged and unchanged in size. Mild, stable atelectasis and/or scarring is seen within the left lung base. No pleural effusion or pneumothorax is identified. Multilevel degenerative changes seen throughout the thoracic spine. IMPRESSION: Stable cardiomegaly with mild left basilar atelectasis and/or scarring. Electronically Signed   By: Suzen Dials M.D.   On: 11/19/2023 02:44   CT Hip Left Wo Contrast Result Date: 11/19/2023 CLINICAL DATA:  Status post fall. EXAM: CT OF THE LEFT HIP WITHOUT CONTRAST TECHNIQUE: Multidetector CT imaging of the left hip was performed according to the standard protocol. Multiplanar CT image reconstructions were also generated. RADIATION DOSE REDUCTION: This exam was performed according to the departmental dose-optimization program which includes automated exposure control, adjustment of the mA and/or kV according to patient size and/or use of iterative reconstruction technique. COMPARISON:  None Available. FINDINGS: Bones/Joint/Cartilage An acute, nondisplaced inter trochanteric fracture of the proximal left femur is seen. There is no evidence of dislocation. No significant arthropathy or focal bone lesions are identified. Ligaments Suboptimally  assessed by CT. Muscles and Tendons Limited in evaluation and otherwise unremarkable. Soft tissues Very mild subcutaneous inflammatory fat stranding is seen along the lateral aspect of the left hip. IMPRESSION: Acute, nondisplaced intertrochanteric fracture of the proximal left femur. Electronically Signed   By: Suzen Dials M.D.   On: 11/19/2023 01:36   CT Cervical Spine Wo Contrast Result Date: 11/19/2023 CLINICAL DATA:  Dizziness with subsequent fall. EXAM: CT CERVICAL SPINE WITHOUT CONTRAST TECHNIQUE: Multidetector CT imaging of the cervical spine was performed without intravenous contrast. Multiplanar CT image reconstructions were also generated. RADIATION DOSE REDUCTION: This exam was performed according to the departmental dose-optimization program which includes automated exposure control, adjustment of the mA and/or kV according to patient size and/or use of iterative reconstruction technique. COMPARISON:  October 24, 2023 FINDINGS: Alignment: There is reversal of the normal cervical spine lordosis. Skull base and vertebrae: No acute fracture. No primary bone lesion or focal pathologic process. Soft tissues and spinal canal: No prevertebral fluid or swelling. No visible canal hematoma. Disc levels: Marked severity multilevel endplate sclerosis, anterior osteophyte formation and posterior bony spurring are seen at the  levels of C3-C4, C4-C5, C5-C6, C6-C7 and C7-T1. Marked severity intervertebral disc space narrowing is also seen at C3-C4, C4-C5, C5-C6, C6-C7 and C7-T1. Bilateral marked severity multilevel facet joint hypertrophy is noted. Upper chest: Negative. Other: None. IMPRESSION: Marked severity multilevel degenerative changes without evidence of an acute fracture or subluxation. Electronically Signed   By: Suzen Dials M.D.   On: 11/19/2023 01:00   CT HEAD WO CONTRAST ( ) Result Date: 11/19/2023 CLINICAL DATA:  Dizziness and subsequent fall. EXAM: CT HEAD WITHOUT CONTRAST TECHNIQUE:  Contiguous axial images were obtained from the base of the skull through the vertex without intravenous contrast. RADIATION DOSE REDUCTION: This exam was performed according to the departmental dose-optimization program which includes automated exposure control, adjustment of the mA and/or kV according to patient size and/or use of iterative reconstruction technique. COMPARISON:  None Available. FINDINGS: Brain: There is generalized cerebral atrophy with widening of the extra-axial spaces and ventricular dilatation. There are areas of decreased attenuation within the white matter tracts of the supratentorial brain, consistent with microvascular disease changes. Chronic left basal ganglia lacunar infarcts are seen. Vascular: No hyperdense vessel or unexpected calcification. Skull: Normal. Negative for fracture or focal lesion. Sinuses/Orbits: No acute finding. Other: None. IMPRESSION: 1. Generalized cerebral atrophy and microvascular disease changes of the supratentorial brain. 2. Chronic left basal ganglia lacunar infarcts. 3. No acute intracranial abnormality. Electronically Signed   By: Suzen Dials M.D.   On: 11/19/2023 00:58   DG Hip Unilat With Pelvis 2-3 Views Left Result Date: 11/19/2023 CLINICAL DATA:  Dizziness with subsequent fall. EXAM: DG HIP (WITH OR WITHOUT PELVIS) 2-3V LEFT COMPARISON:  None Available. FINDINGS: There is no evidence of acute hip fracture or dislocation. Mild degenerative changes are seen involving both hips in the form of joint space narrowing. Marked severity degenerative changes are present within the visualized portion of the lower lumbar spine. IMPRESSION: No acute osseous abnormality. Electronically Signed   By: Suzen Dials M.D.   On: 11/19/2023 00:53    Positive ROS: All other systems have been reviewed and were otherwise negative with the exception of those mentioned in the HPI and as above.  Physical Exam: General:  Alert, no acute distress Psychiatric:   Patient is competent for consent with normal mood and affect   Cardiovascular:  No pedal edema Respiratory:  No wheezing, non-labored breathing GI:  Abdomen is soft and non-tender Skin:  No lesions in the area of chief complaint Neurologic:  Sensation intact distally Lymphatic:  No axillary or cervical lymphadenopathy  Orthopedic Exam:  Left lower extremity Skin intact over the left hip tender to palpation over the greater trochanter Pain with logroll reproduces pain in the groin and lateral hip No tenderness palpation of the distal femur, knee, tibia, ankle foot or toes Compartments all soft Neurovascular intact distally able to dorsiflex and plantarflex the foot and toes Intact dorsalis pedis pulse with brisk capillary refill  Secondary survey No tenderness to palpation over other bony prominences in the lower extremities or bilateral upper extremities No pain with logroll or simulated axial loading of the right lower extremity All compartments soft No tenderness to palpation over the cervical or thoracic spine, no bony step-off Motor grossly intact throughout, no focal deficits Sensation grossly intact throughout, no focal deficits Good distal pulses and capillary refill on all extremities   Imaging:  X-rays CT and MRI images and report reviewed by myself of the left hip show a nondisplaced intertrochanteric femur fracture which progresses more than two  thirds of the way across the intertrochanteric line on MRI nearly abutting the lesser trochanter.  This confirms a nearly complete intertrochanteric fracture.  Agree with radiology interpretation  Assessment: Left intertrochanteric femur fracture  Plan: Mikel is a 73 year old female who presents with a nondisplaced left intertrochanteric femur fracture.  I discussed the nonoperative and operative options with the patient.  Given her near complete intertrochanteric fracture nonoperative management will put her at a high risk for  displacement and we would have to limit her weightbearing and likely put her on bedrest.  Given this and the potential for weightbearing with surgical intervention the patient has elected move forward with operative intervention of the left hip under shared decision making model.  The patient will need a left hip intramedullary nail.  A long discussion took place with the patient describing what a nodular nail is and what the procedure would entail. The xrays were reviewed with the patient and the implants were discussed. The ability to secure the implant utilizing screw and blade fixation was discussed. Surgical exposures were discussed with the patient.    The hospitalization and post-operative care and rehabilitation were also discussed. The use of perioperative antibiotics and DVT prophylaxis were discussed. The risk, benefits and alternatives to a surgical intervention were discussed at length with the patient. The patient was also advised of risks related to the medical comorbidities. A lengthy discussion took place to review the most common complications including but not limited to: deep vein thrombosis, pulmonary embolus, heart attack, stroke, infection, wound breakdown, dislocation, numbness, leg length in-equality, damage to nerves, intraoperative fracture, hardware cut out or failure, malunion/ nonunion, tendon,muscles, arteries or other blood vessels, death and other possible complications from anesthesia. The patient was told that we will take steps to minimize these risks by using sterile technique, antibiotics and DVT prophylaxis when appropriate and follow the patient postoperatively in the office setting to monitor progress. The possibility of recurrent pain, no improvement in pain and actual worsening of pain were also discussed with the patient.      The benefits of surgery were discussed with the patient including the potential for improving the patient's current clinical condition through  operative intervention. Alternatives to surgical intervention including conservative management were also discussed in detail. All questions were answered to the satisfaction of the patient. The patient participated and agreed to the plan of care as well as the use of the recommended implants for their surgery.    Plan for surgery today 11/19/2023 N.p.o. for the operating room Hold anticoagulation  All questions answered and patient agrees to the above plan consent was filled out and left at bedside    Arthea Sheer MD  Beeper #:  [phone]  11/19/2023 10:58 AM

## 2023-11-19 NOTE — Plan of Care (Signed)
  Problem: Education: Goal: Knowledge of General Education information will improve Description: Including pain rating scale, medication(s)/side effects and non-pharmacologic comfort measures Outcome: Progressing   Problem: Health Behavior/Discharge Planning: Goal: Ability to manage health-related needs will improve Outcome: Progressing   Problem: Clinical Measurements: Goal: Ability to maintain clinical measurements within normal limits will improve Outcome: Progressing Goal: Will remain free from infection Outcome: Progressing Goal: Diagnostic test results will improve Outcome: Progressing Goal: Respiratory complications will improve Outcome: Progressing Goal: Cardiovascular complication will be avoided Outcome: Progressing   Problem: Activity: Goal: Risk for activity intolerance will decrease Outcome: Progressing   Problem: Nutrition: Goal: Adequate nutrition will be maintained Outcome: Progressing   Problem: Coping: Goal: Level of anxiety will decrease Outcome: Progressing   Problem: Elimination: Goal: Will not experience complications related to bowel motility Outcome: Progressing Goal: Will not experience complications related to urinary retention Outcome: Progressing   Problem: Pain Managment: Goal: General experience of comfort will improve and/or be controlled Outcome: Progressing   Problem: Safety: Goal: Ability to remain free from injury will improve Outcome: Progressing   Problem: Skin Integrity: Goal: Risk for impaired skin integrity will decrease Outcome: Progressing   Problem: Education: Goal: Ability to describe self-care measures that may prevent or decrease complications (Diabetes Survival Skills Education) will improve Outcome: Progressing Goal: Individualized Educational Video(s) Outcome: Progressing   Problem: Coping: Goal: Ability to adjust to condition or change in health will improve Outcome: Progressing   Problem: Fluid  Volume: Goal: Ability to maintain a balanced intake and output will improve Outcome: Progressing   Problem: Health Behavior/Discharge Planning: Goal: Ability to identify and utilize available resources and services will improve Outcome: Progressing Goal: Ability to manage health-related needs will improve Outcome: Progressing   Problem: Metabolic: Goal: Ability to maintain appropriate glucose levels will improve Outcome: Progressing   Problem: Nutritional: Goal: Maintenance of adequate nutrition will improve Outcome: Progressing Goal: Progress toward achieving an optimal weight will improve Outcome: Progressing   Problem: Skin Integrity: Goal: Risk for impaired skin integrity will decrease Outcome: Progressing   Problem: Tissue Perfusion: Goal: Adequacy of tissue perfusion will improve Outcome: Progressing   Problem: Education: Goal: Verbalization of understanding the information provided (i.e., activity precautions, restrictions, etc) will improve Outcome: Progressing Goal: Individualized Educational Video(s) Outcome: Progressing   Problem: Activity: Goal: Ability to ambulate and perform ADLs will improve Outcome: Progressing   Problem: Clinical Measurements: Goal: Postoperative complications will be avoided or minimized Outcome: Progressing   Problem: Self-Concept: Goal: Ability to maintain and perform role responsibilities to the fullest extent possible will improve Outcome: Progressing   Problem: Pain Management: Goal: Pain level will decrease Outcome: Progressing

## 2023-11-19 NOTE — Anesthesia Procedure Notes (Signed)
 Procedure Name: Intubation Date/Time: 11/19/2023 1:24 PM  Performed by: Dominica Krabbe, CRNAPre-anesthesia Checklist: Patient identified, Emergency Drugs available, Suction available, Patient being monitored and Timeout performed Patient Re-evaluated:Patient Re-evaluated prior to induction Oxygen Delivery Method: Circle system utilized Preoxygenation: Pre-oxygenation with 100% oxygen Induction Type: IV induction and Rapid sequence Laryngoscope Size: McGrath and 3 Grade View: Grade II Tube type: Oral Tube size: 6.5 mm Number of attempts: 1 Airway Equipment and Method: Stylet and Video-laryngoscopy Placement Confirmation: ETT inserted through vocal cords under direct vision, positive ETCO2 and breath sounds checked- equal and bilateral Secured at: 21 cm Tube secured with: Tape Dental Injury: Teeth and Oropharynx as per pre-operative assessment

## 2023-11-19 NOTE — Assessment & Plan Note (Addendum)
-   We will continue antihypertensive therapy.

## 2023-11-19 NOTE — Transfer of Care (Signed)
 Immediate Anesthesia Transfer of Care Note  Patient: [name]  Procedure(s) Performed: FIXATION, FRACTURE, INTERTROCHANTERIC, WITH INTRAMEDULLARY ROD (Left: Hip)  Patient Location: PACU  Anesthesia Type:General  Level of Consciousness: awake, alert , and oriented  Airway & Oxygen Therapy: Patient Spontanous Breathing and Patient connected to face mask oxygen  Post-op Assessment: Report given to RN and Post -op Vital signs reviewed and stable  Post vital signs: Reviewed and stable  Last Vitals:  Vitals Value Taken Time  BP 160/77 11/19/23 14:55  Temp 36.6 C 11/19/23 14:54  Pulse 87 11/19/23 14:55  Resp 15 11/19/23 14:55  SpO2 100 % 11/19/23 14:55    Last Pain:  Vitals:   11/19/23 1454  TempSrc:   PainSc: Asleep      Patients Stated Pain Goal: 0 (11/19/23 0353)  Complications: No notable events documented.

## 2023-11-19 NOTE — H&P (Signed)
 [place]   PATIENT NAME: [name]    MR#:  [id number]  DATE OF BIRTH:  [birth year]  DATE OF ADMISSION:  11/18/2023  PRIMARY CARE PHYSICIAN: Kandis Stefano Iles, MD   Patient is coming from: Home  REQUESTING/REFERRING PHYSICIAN: Ward, Josette HERO, DO   CHIEF COMPLAINT:   Chief Complaint  Patient presents with   Fall    HISTORY OF PRESENT ILLNESS:  [name] is a 73 y.o. female with medical history significant for type diabetes mellitus, essential hypertension and dyslipidemia and intracranial aneurysm for which she declined intervention in 2023 at [facility], who presented to the emergency room with acute onset of vertigo with subsequent fall while washing her dishes and manage left hip pain.  She denies any presyncope or syncope.  She denies any bleeding diathesis.  No chest pain or palpitations.  No paresthesias or focal muscle weakness.  No witnessed seizures.  No nausea or vomiting or abdominal pain.  No cough or wheezing or dyspnea.  No chest pain or palpitations.  ED Course: When she came to the ER, BP was 157/73 with temperature 97.1 with otherwise normal vital signs.  Labs revealed a blood glucose of 370 with otherwise albumin of 3.3 and creatinine 1.15.  High-sensitivity troponin was 19 and later 18 and CBC was normal.  Blood group was A+ with negative antibody screen.  PT and INR were normal. EKG as reviewed by me : Normal sinus rhythm with rate of 81. Imaging: Noncontrasted CT scan showed marked severe multilevel degenerative changes without evidence of an acute fracture or subluxation. Chest x-ray showed no acute cardiopulmonary disease.  Hip and pelvic x-ray revealed no acute osseous abnormality. Left hip CT scan showed acute nondisplaced intertrochanteric fracture of the proximal left femur.   The patient was given 500 mL IV normal saline bolus, 4 mg of IV morphine  sulfate and 4 mg of IV Zofran , 25 mg p.o. meclizine  and 300 mg p.o. Neurontin  as well as 50 mcg  of IV fentanyl .  She will be admitted to a medical surgical telemetry bed for further evaluation and management. PAST MEDICAL HISTORY:   Past Medical History:  Diagnosis Date   Diabetes mellitus without complication (HCC)   -Hypertension - Dyslipidemia - Intracranial aneurysm  PAST SURGICAL HISTORY:   Past Surgical History:  Procedure Laterality Date   IR ANGIO INTRA EXTRACRAN SEL INTERNAL CAROTID BILAT MOD SED  07/05/2021   IR ANGIO VERTEBRAL SEL VERTEBRAL UNI R MOD SED  07/05/2021   IR RADIOLOGIST EVAL & MGMT  07/16/2021   IR US  GUIDE VASC ACCESS RIGHT  07/05/2021    SOCIAL HISTORY:   Social History   Tobacco Use   Smoking status: Every Day    Current packs/day: 0.50    Average packs/day: 0.5 packs/day for 50.7 years (25.3 ttl pk-yrs)    Types: Cigarettes    Start date: 1975    Passive exposure: Never   Smokeless tobacco: Never  Substance Use Topics   Alcohol use: Not Currently    FAMILY HISTORY:  Positive for CVA and MI . DRUG ALLERGIES:   Allergies  Allergen Reactions   Phenylmercuric Nitrate Other (See Comments)    BLISTERS    REVIEW OF SYSTEMS:   ROS As per history of present illness. All pertinent systems were reviewed above. Constitutional, HEENT, cardiovascular, respiratory, GI, GU, musculoskeletal, neuro, psychiatric, endocrine, integumentary and hematologic systems were reviewed and are otherwise negative/unremarkable except for positive findings mentioned above in the HPI.  MEDICATIONS AT HOME:   Prior to Admission medications   Medication Sig Start Date End Date Taking? Authorizing Provider  Calcium  Carb-Cholecalciferol  (CALCIUM  + VITAMIN D3 PO) Take 1 tablet by mouth daily.   Yes [provider]  gabapentin  (NEURONTIN ) 800 MG tablet Take 800 mg by mouth 3 (three) times daily.   Yes [provider]  metFORMIN (GLUCOPHAGE-XR) 500 MG 24 hr tablet Take 500 mg by mouth daily with breakfast.   Yes [provider]   amLODipine  (NORVASC ) 5 MG tablet Take 5 mg by mouth daily. Patient not taking: Reported on 11/19/2023    [provider]  aspirin  EC 81 MG tablet Take 81 mg by mouth daily. Swallow whole.    [provider]  atorvastatin  (LIPITOR) 10 MG tablet Take 10 mg by mouth daily. Patient not taking: Reported on 11/19/2023    [provider]  buPROPion  (ZYBAN ) 150 MG 12 hr tablet Take 150 mg by mouth 2 (two) times daily. Patient not taking: Reported on 11/19/2023 05/07/21   [provider]  carvedilol  (COREG ) 25 MG tablet Take 25 mg by mouth 2 (two) times daily with a meal. Patient not taking: Reported on 11/19/2023    [provider]  insulin  degludec (TRESIBA ) 100 UNIT/ML FlexTouch Pen Inject 40 Units into the skin daily. Patient not taking: Reported on 11/19/2023 05/27/21   Demaio, Alexa, MD  lisinopril  (ZESTRIL ) 40 MG tablet Take 40 mg by mouth daily.    [provider]  LYRICA  100 MG capsule Take 100 mg by mouth 2 (two) times daily. 05/10/21   [provider]  meloxicam (MOBIC) 15 MG tablet Take 15 mg by mouth daily. Patient not taking: Reported on 11/19/2023 05/07/21   [provider]  traMADol  (ULTRAM ) 50 MG tablet Take 1 tablet (50 mg total) by mouth every 12 (twelve) hours as needed. Patient not taking: Reported on 11/19/2023 03/01/23 02/29/24  Poggi, Jenna E, PA-C  TRULICITY 0.75 MG/0.5ML SOPN Inject 0.75 mg into the skin once a week. 05/10/21   [provider]  vitamin B-12 (CYANOCOBALAMIN ) 1000 MCG tablet Take 1,000 mcg by mouth daily.    [provider]      VITAL SIGNS:  Blood pressure (!) 148/60, pulse 91, temperature 97.8 F (36.6 C), resp. rate 16, height 5' 4 (1.626 m), weight 69.9 kg, SpO2 94%.  PHYSICAL EXAMINATION:  Physical Exam  GENERAL:  73 y.o.-year-old patient lying in the bed with no acute distress.  EYES: Pupils equal, round, reactive to light and accommodation. No scleral icterus. Extraocular  muscles intact.  HEENT: Head atraumatic, normocephalic. Oropharynx and nasopharynx clear.  NECK:  Supple, no jugular venous distention. No thyroid enlargement, no tenderness.  LUNGS: Normal breath sounds bilaterally, no wheezing, rales,rhonchi or crepitation. No use of accessory muscles of respiration.  CARDIOVASCULAR: Regular rate and rhythm, S1, S2 normal. No murmurs, rubs, or gallops.  ABDOMEN: Soft, nondistended, nontender. Bowel sounds present. No organomegaly or mass.  EXTREMITIES: No pedal edema, cyanosis, or clubbing. Musculoskeletal: Left lateral hip tenderness. NEUROLOGIC: Cranial nerves II through XII are intact. Muscle strength 5/5 in all extremities. Sensation intact. Gait not checked.  PSYCHIATRIC: The patient is alert and oriented x 3.  Normal affect and good eye contact. SKIN: No obvious rash, lesion, or ulcer.   LABORATORY PANEL:   CBC Recent Labs  Lab 11/18/23 2341  WBC 8.9  HGB 12.7  HCT 38.9  PLT 201   ------------------------------------------------------------------------------------------------------------------  Chemistries  Recent Labs  Lab 11/18/23 2341  NA 138  K 4.2  CL 101  CO2 22  GLUCOSE 370*  BUN 14  CREATININE 1.15*  CALCIUM  8.9  AST 25  ALT 12  ALKPHOS 109  BILITOT 0.6   ------------------------------------------------------------------------------------------------------------------  Cardiac Enzymes No results for input(s): TROPONINI in the last 168 hours. ------------------------------------------------------------------------------------------------------------------  RADIOLOGY:  MR BRAIN WO CONTRAST Result Date: 11/19/2023 CLINICAL DATA:  Neuro deficit, acute, stroke suspected EXAM: MRI HEAD WITHOUT CONTRAST TECHNIQUE: Multiplanar, multiecho pulse sequences of the brain and surrounding structures were obtained without intravenous contrast. COMPARISON:  CT head 11/19/2023. FINDINGS: Brain: Acute infarct in the posterior limb of  the right internal capsule superiorly. Possible punctate acute infarct in the left middle cerebellar peduncle versus artifact (see series 5, image 15). Remote lacunar infarcts in the pons, right corona radiata and left basal ganglia. Scattered T2/FLAIR hyperintensities the white matter, compatible with chronic microvascular ischemic disease. No acute hemorrhage, mass lesion, midline shift or hydrocephalus. Cerebral atrophy. Evidence of prior hemorrhage in the inferior left basal ganglia. Vascular: Major arterial flow voids are maintained at the skull base. Skull and upper cervical spine: Normal marrow signal. Sinuses/Orbits: Negative. Other: No mastoid effusions. IMPRESSION: 1. Acute infarct in the posterior limb of the right internal capsule superiorly. 2. Possible punctate acute infarct in the left middle cerebellar peduncle versus artifact. 3. Remote lacunar infarcts in the pons, left basal ganglia and right corona radiata. Electronically Signed   By: Gilmore GORMAN Molt M.D.   On: 11/19/2023 03:39   DG Chest Portable 1 View Result Date: 11/19/2023 CLINICAL DATA:  Left hip fracture sent for preoperative evaluation. EXAM: PORTABLE CHEST 1 VIEW COMPARISON:  March 01, 2023 FINDINGS: The cardiac silhouette is enlarged and unchanged in size. Mild, stable atelectasis and/or scarring is seen within the left lung base. No pleural effusion or pneumothorax is identified. Multilevel degenerative changes seen throughout the thoracic spine. IMPRESSION: Stable cardiomegaly with mild left basilar atelectasis and/or scarring. Electronically Signed   By: Suzen Dials M.D.   On: 11/19/2023 02:44   CT Hip Left Wo Contrast Result Date: 11/19/2023 CLINICAL DATA:  Status post fall. EXAM: CT OF THE LEFT HIP WITHOUT CONTRAST TECHNIQUE: Multidetector CT imaging of the left hip was performed according to the standard protocol. Multiplanar CT image reconstructions were also generated. RADIATION DOSE REDUCTION: This exam was  performed according to the departmental dose-optimization program which includes automated exposure control, adjustment of the mA and/or kV according to patient size and/or use of iterative reconstruction technique. COMPARISON:  None Available. FINDINGS: Bones/Joint/Cartilage An acute, nondisplaced inter trochanteric fracture of the proximal left femur is seen. There is no evidence of dislocation. No significant arthropathy or focal bone lesions are identified. Ligaments Suboptimally assessed by CT. Muscles and Tendons Limited in evaluation and otherwise unremarkable. Soft tissues Very mild subcutaneous inflammatory fat stranding is seen along the lateral aspect of the left hip. IMPRESSION: Acute, nondisplaced intertrochanteric fracture of the proximal left femur. Electronically Signed   By: Suzen Dials M.D.   On: 11/19/2023 01:36   CT Cervical Spine Wo Contrast Result Date: 11/19/2023 CLINICAL DATA:  Dizziness with subsequent fall. EXAM: CT CERVICAL SPINE WITHOUT CONTRAST TECHNIQUE: Multidetector CT imaging of the cervical spine was performed without intravenous contrast. Multiplanar CT image reconstructions were also generated. RADIATION DOSE REDUCTION: This exam was performed according to the departmental dose-optimization program which includes automated exposure control, adjustment of the mA and/or kV according to patient size and/or use of iterative reconstruction technique. COMPARISON:  October 24, 2023 FINDINGS: Alignment:  There is reversal of the normal cervical spine lordosis. Skull base and vertebrae: No acute fracture. No primary bone lesion or focal pathologic process. Soft tissues and spinal canal: No prevertebral fluid or swelling. No visible canal hematoma. Disc levels: Marked severity multilevel endplate sclerosis, anterior osteophyte formation and posterior bony spurring are seen at the levels of C3-C4, C4-C5, C5-C6, C6-C7 and C7-T1. Marked severity intervertebral disc space narrowing is  also seen at C3-C4, C4-C5, C5-C6, C6-C7 and C7-T1. Bilateral marked severity multilevel facet joint hypertrophy is noted. Upper chest: Negative. Other: None. IMPRESSION: Marked severity multilevel degenerative changes without evidence of an acute fracture or subluxation. Electronically Signed   By: Suzen Dials M.D.   On: 11/19/2023 01:00   CT HEAD WO CONTRAST ( ) Result Date: 11/19/2023 CLINICAL DATA:  Dizziness and subsequent fall. EXAM: CT HEAD WITHOUT CONTRAST TECHNIQUE: Contiguous axial images were obtained from the base of the skull through the vertex without intravenous contrast. RADIATION DOSE REDUCTION: This exam was performed according to the departmental dose-optimization program which includes automated exposure control, adjustment of the mA and/or kV according to patient size and/or use of iterative reconstruction technique. COMPARISON:  None Available. FINDINGS: Brain: There is generalized cerebral atrophy with widening of the extra-axial spaces and ventricular dilatation. There are areas of decreased attenuation within the white matter tracts of the supratentorial brain, consistent with microvascular disease changes. Chronic left basal ganglia lacunar infarcts are seen. Vascular: No hyperdense vessel or unexpected calcification. Skull: Normal. Negative for fracture or focal lesion. Sinuses/Orbits: No acute finding. Other: None. IMPRESSION: 1. Generalized cerebral atrophy and microvascular disease changes of the supratentorial brain. 2. Chronic left basal ganglia lacunar infarcts. 3. No acute intracranial abnormality. Electronically Signed   By: Suzen Dials M.D.   On: 11/19/2023 00:58   DG Hip Unilat With Pelvis 2-3 Views Left Result Date: 11/19/2023 CLINICAL DATA:  Dizziness with subsequent fall. EXAM: DG HIP (WITH OR WITHOUT PELVIS) 2-3V LEFT COMPARISON:  None Available. FINDINGS: There is no evidence of acute hip fracture or dislocation. Mild degenerative changes are seen  involving both hips in the form of joint space narrowing. Marked severity degenerative changes are present within the visualized portion of the lower lumbar spine. IMPRESSION: No acute osseous abnormality. Electronically Signed   By: Suzen Dials M.D.   On: 11/19/2023 00:53      IMPRESSION AND PLAN:  Assessment and Plan: * Closed left hip fracture, initial encounter [facility]) - The patient will be admitted to a telemetry bed. - Will be kept NPO. - Pain management will be provided. - The bed consult to be obtained. - Dr. Lorelle was notified about the patient  Tobacco abuse She was counseled for smoking cessation and will receive further counseling here.  Dyslipidemia - Will continue statin therapy.  Type 2 diabetes mellitus with peripheral neuropathy (HCC) - The patient will be placed on supplemental coverage with NovoLog . - Will continue basal coverage. - Will hold off metformin and Trulicity. - Will continue Neurontin .  Essential hypertension We will continue antihypertensive therapy.   DVT prophylaxis: SCDs. Advanced Care Planning:  Code Status: The patient is DNR only. Family Communication:  The plan of care was discussed in details with the patient (and family). I answered all questions. The patient agreed to proceed with the above mentioned plan. Further management will depend upon hospital course. Disposition Plan: Back to previous home environment Consults called: Ortho All the records are reviewed and case discussed with ED provider.  Status is: Inpatient  At the time of the admission, it appears that the appropriate admission status for this patient is inpatient.  This is judged to be reasonable and necessary in order to provide the required intensity of service to ensure the patient's safety given the presenting symptoms, physical exam findings and initial radiographic and laboratory data in the context of comorbid conditions.  The patient requires inpatient  status due to high intensity of service, high risk of further deterioration and high frequency of surveillance required.  I certify that at the time of admission, it is my clinical judgment that the patient will require inpatient hospital care extending more than 2 midnights.                            Dispo: The patient is from: Home              Anticipated d/c is to: Home              Patient currently is not medically stable to d/c.              Difficult to place patient: No  Madison DELENA Peaches M.D on 11/19/2023 at 6:55 AM  [facility]   From 7 PM-7 AM, contact night-coverage [email or web address]  CC: Primary care physician; Kandis Stefano Iles, MD

## 2023-11-19 NOTE — Progress Notes (Signed)
 Patient returns from PACU, honeycomb dressings x 2 to left hip CDI. Patient moving left foot, good pulses and no c/o discomfort at this time.

## 2023-11-19 NOTE — Op Note (Signed)
 Patient Name: [name]  FMW:[id number]  Pre-Operative Diagnosis: Left hip Intertrochanteric fracture  Post-Operative Diagnosis: (same)  Procedure: Left Hip Intramedullary nail   Components/Implants: Nail:TFNA [id number] deg  Lag Blade :85mm Locking Screw:71mm  Date of Surgery: 11/19/2023  Surgeon: Arthea Sheer MD  Assistant: None   Anesthesiologist: Mazzoni  Anesthesia: General   EBL: 75  Complications: None   Brief history: The patient is a 73 year old female who presented to the [facility] emergency room after a fall and found to have a  left hip intertrochanteric fracture.  The patient was admitted by the medical team and optimized for surgery.  A thorough discussion was had with the patient and family about the risks and benefits of surgical intervention for their hip fracture as definitive treatment.  The patient and family opted to proceed with the operation.  All preoperative films were reviewed and an appropriate surgical plan was made prior to surgery.   Description of procedure: The patient was brought to the operating room where laterality was confirmed by all those present to be the left side.  The patient was administered anesthesia on a stretcher prior to being moved supine on the operating room table. Patient was given an intravenous dose of antibiotics for surgical prophylaxis and TXA.  All bony prominences and extremities were well padded and the patient was securely attached to the table boots, a perineal post was placed and the patient had a safety strap placed.  Surgical site was prepped with alcohol and chlorhexidine. The surgical site over the hip was and draped in typical sterile fashion with multiple layers of adhesive and nonadhesive drapes.  The incision site was marked out with a sterile marker under fluoroscopic guidance.    A surgical timeout was then called with participation of all staff in the room the patient was then a  confirmed again and laterality confirmed. The hip fracture was reduced through indirect measures with traction and rotation of the leg on the fracture table. After an acceptable reduction was obtained on AP and lateral images the procedure started.  An incision was made just proximal to the greater trochanter through the skin subcutaneous tissues and an incision was made in the glut max fascia.  A guidewire was placed through the greater trochanter at the tip under x-ray guidance into the intertrochanteric region.  The position of this wire was assessed on AP and lateral fluoroscopic images to ensure position.  An opening reamer was used to create access at the tip of the greater trochanter under fluoroscopic guidance.   A size 9x170 nail was advanced through the reamed hole in the proximal femur and seated within the femur to an appropriate depth under fluoroscopic guidance.  The aiming jig was used to mark an incision site for a lag blade to the femoral head which was then incised with a scalpel.  A wire was then advanced through the lateral cortex of the femur into the center of the femoral head on both AP and lateral fluoroscopic imaging stopping at the subchondral bone without penetration of the femoral head.  This wire was then used to measure for a lag screw and a size 85mm lag blade was inserted into the femoral head through the lateral cortex of the femur and the nail under fluoroscopic guidance.  The setscrew was then tightened in the proximal part of the nail and engaged with the lag blade with a small amount of play left so as to allow compression of the fracture.  The lag screw driver was removed and the aiming jig was switched for the distal interlocking screw .  A drill was used to drill a hole for the distal interlocking screw under fluoroscopic guidance and a size 36 screw was placed.  The intramedullary nail was assessed on AP and lateral fluoroscopic guidance prior to removal of the aiming  jig.  Final fluoroscopic x-rays were then taken after removal of the jig.  The nail was found to be in appropriate position on AP and lateral imaging with appropriate lengths of both the lag screw in the distal locking screw.  The fascia was closed with 0 Vicryl interrupted figure-of-eight sutures.  The subcutaneous tissues were closed with 2-0 Vicryl and the skin closed with 3-0 Monocryl and Dermabond.  Sterile dressings were applied to the incisions.   The patient was awoken from anesthesia transferred off of the operating room table onto a hospital bed.  The patient had a good pulse postoperatively in the foot . the patient was then transferred to the PACU in stable condition.

## 2023-11-19 NOTE — Assessment & Plan Note (Signed)
 Will continue statin therapy

## 2023-11-19 NOTE — Inpatient Diabetes Management (Signed)
 Inpatient Diabetes Program Recommendations  AACE/ADA: New Consensus Statement on Inpatient Glycemic Control  Target Ranges:  Prepandial:   less than 140 mg/dL      Peak postprandial:   less than 180 mg/dL (1-2 hours)      Critically ill patients:  140 - 180 mg/dL    Latest Reference Range & Units 11/19/23 07:52  Glucose-Capillary 70 - 99 mg/dL 694 (H)    Latest Reference Range & Units 11/18/23 23:41 11/19/23 06:35  Glucose 70 - 99 mg/dL 629 (H) 691 (H)   Review of Glycemic Control  Diabetes history: DM2 Outpatient Diabetes medications: Tresiba  50 units QPM, Metformin XR 500 mg TID, Trulicity 0.75 mg Qweek (Friday) Current orders for Inpatient glycemic control: Novolog  0-9 units Q4H  Inpatient Diabetes Program Recommendations:    Insulin : Please consider ordering insulin  glargine 10 units Q24H.  NOTE: Patient admitted after fall with hip fracture and plan for hip surgery today. Called patient over phone to ask about DM medication regimen. Patient confirms that she is taking Tresiba  50 units QPM, Metformin XR 500 mg TID with meals, and Trulicity 0.75 mg Qweek. Patient states that she does finger sticks at home and her glucose has been up and down. She reports that her Tresiba  dose was increased to 50 units about 2 months ago and her glucose initially came down and got better but recently it has been running a little higher. Patient reports that she takes DM medications consistently (does not skip or adjust dose).  Discussed initial glucose of 370 mg/dl and patient reports she was very surprised her glucose was that high. Discussed importance of tight DM control following surgery to decrease risk of surgical complications. Discussed glucose goals and asked patient to reach out to her PCP if her glucose is consistently over 180 mg/dl at home as she may need adjustments with DM medications.  Informed patient that she is currently only ordered Novolog  so it would be requested that low dose basal  insulin  be ordered today. Patient reports her surgery is scheduled for 12:45 today so they should be coming to get her shortly. Patient states the nurse tech was checking glucose and it is currently 192 mg/dl.  Patient reports understanding and she has no questions at this time.  Thanks, Earnie Gainer, RN, MSN, CDCES Diabetes Coordinator Inpatient Diabetes Program [phone] (Team Pager from 8am to 5pm)

## 2023-11-19 NOTE — ED Provider Notes (Signed)
 [facility] Provider Note    Event Date/Time   First MD Initiated Contact with Patient 11/18/23 2339     (approximate)   History   Fall   HPI  [name] is a 73 y.o. female with history of diabetes, hypertension, hyperlipidemia, intracranial aneurysm which patient has declined intervention for last seen at [facility] in 2023 who presents to the emergency department with a fall.  States she was in her kitchen and turned around quickly and states that the room started spinning and she fell striking her head and landing on her left side.  She denies loss of consciousness.  She denies being on blood thinners.  She states she was not able to get up and stand, walk due to hip pain and had to crawl to get help.  No numbness, tingling, weakness, headache.  No chest pain or shortness of breath.  No recent vomiting or diarrhea.   History provided by patient and husband, EMS.    Past Medical History:  Diagnosis Date   Diabetes mellitus without complication (HCC)     Past Surgical History:  Procedure Laterality Date   IR ANGIO INTRA EXTRACRAN SEL INTERNAL CAROTID BILAT MOD SED  07/05/2021   IR ANGIO VERTEBRAL SEL VERTEBRAL UNI R MOD SED  07/05/2021   IR RADIOLOGIST EVAL & MGMT  07/16/2021   IR US  GUIDE VASC ACCESS RIGHT  07/05/2021    MEDICATIONS:  Prior to Admission medications   Medication Sig Start Date End Date Taking? Authorizing Provider  amLODipine  (NORVASC ) 5 MG tablet Take 5 mg by mouth daily.    [provider]  aspirin  EC 81 MG tablet Take 81 mg by mouth daily. Swallow whole.    [provider]  atorvastatin  (LIPITOR) 10 MG tablet Take 10 mg by mouth daily.    [provider]  buPROPion  (ZYBAN ) 150 MG 12 hr tablet Take 150 mg by mouth 2 (two) times daily. 05/07/21   [provider]  Calcium  Carb-Cholecalciferol  (CALCIUM  + VITAMIN D3 PO) Take 1 tablet by mouth daily.    [provider]  carvedilol  (COREG )  25 MG tablet Take 25 mg by mouth 2 (two) times daily with a meal.    [provider]  insulin  degludec (TRESIBA ) 100 UNIT/ML FlexTouch Pen Inject 40 Units into the skin daily. 05/27/21   Demaio, Alexa, MD  lisinopril  (ZESTRIL ) 40 MG tablet Take 40 mg by mouth daily.    [provider]  LYRICA  100 MG capsule Take 100 mg by mouth 2 (two) times daily. 05/10/21   [provider]  meloxicam (MOBIC) 15 MG tablet Take 15 mg by mouth daily. 05/07/21   [provider]  traMADol  (ULTRAM ) 50 MG tablet Take 1 tablet (50 mg total) by mouth every 12 (twelve) hours as needed. 03/01/23 02/29/24  Poggi, Jenna E, PA-C  TRULICITY 0.75 MG/0.5ML SOPN Inject 0.75 mg into the skin once a week. 05/10/21   [provider]  vitamin B-12 (CYANOCOBALAMIN ) 1000 MCG tablet Take 1,000 mcg by mouth daily.    [provider]    Physical Exam   Triage Vital Signs: ED Triage Vitals  Encounter Vitals Group     BP 11/18/23 2341 (!) 157/73     Girls Systolic BP Percentile --      Girls Diastolic BP Percentile --      Boys Systolic BP Percentile --      Boys Diastolic BP Percentile --  Pulse Rate 11/18/23 2341 86     Resp 11/18/23 2341 16     Temp 11/18/23 2341 (!) 97.1 F (36.2 C)     Temp Source 11/18/23 2341 Oral     SpO2 11/18/23 2341 97 %     Weight 11/18/23 2338 154 lb (69.9 kg)     Height 11/18/23 2338 5' 4 (1.626 m)     Head Circumference --      Peak Flow --      Pain Score 11/18/23 2338 5     Pain Loc --      Pain Education --      Exclude from Growth Chart --     Most recent vital signs: Vitals:   11/19/23 0215 11/19/23 0320  BP:  (!) 148/60  Pulse: 88 91  Resp: 16 16  Temp:  97.8 F (36.6 C)  SpO2: 95% 94%     CONSTITUTIONAL: Alert, responds appropriately to questions.  Appears uncomfortable HEAD: Normocephalic; atraumatic EYES: Conjunctivae clear, PERRL, EOMI ENT: normal nose; no rhinorrhea; moist mucous membranes; pharynx without lesions  noted; no dental injury; no septal hematoma, no epistaxis; no facial deformity or bony tenderness NECK: Supple, no midline spinal tenderness, step-off or deformity; trachea midline CARD: RRR; S1 and S2 appreciated; no murmurs, no clicks, no rubs, no gallops RESP: Normal chest excursion without splinting or tachypnea; breath sounds clear and equal bilaterally; no wheezes, no rhonchi, no rales; no hypoxia or respiratory distress CHEST:  chest wall stable, no crepitus or ecchymosis or deformity, nontender to palpation; no flail chest ABD/GI: Non-distended; soft, non-tender, no rebound, no guarding; no ecchymosis or other lesions noted PELVIS:  stable BACK:  The back appears normal; no midline spinal tenderness, step-off or deformity EXT: Patient's left leg is shortened and externally rotated with lateral hip tenderness, 2+ DP pulses bilaterally, compartments soft, no distal tenderness to the left leg SKIN: Normal color for age and race; warm NEURO: No facial asymmetry, normal speech, moving all extremities equally, normal sensation diffusely, no drift, no nystagmus, no dysmetria, unable to test gait due to hip injury, NIH stroke scale 0  ED Results / Procedures / Treatments   LABS: (all labs ordered are listed, but only abnormal results are displayed) Labs Reviewed  COMPREHENSIVE METABOLIC PANEL WITH GFR - Abnormal; Notable for the following components:      Result Value   Glucose, Bld 370 (*)    Creatinine, Ser 1.15 (*)    Albumin 3.3 (*)    GFR, Estimated 51 (*)    All other components within normal limits  TROPONIN I (HIGH SENSITIVITY) - Abnormal; Notable for the following components:   Troponin I (High Sensitivity) 19 (*)    All other components within normal limits  TROPONIN I (HIGH SENSITIVITY) - Abnormal; Notable for the following components:   Troponin I (High Sensitivity) 18 (*)    All other components within normal limits  CBC  PROTIME-INR  URINALYSIS, ROUTINE W REFLEX  MICROSCOPIC  CBC  BASIC METABOLIC PANEL WITH GFR  TYPE AND SCREEN     EKG:  EKG Interpretation Date/Time:  Tuesday November 18 2023 23:55:49 EDT Ventricular Rate:  81 PR Interval:  132 QRS Duration:  90 QT Interval:  394 QTC Calculation: 457 R Axis:   56  Text Interpretation: Normal sinus rhythm Nonspecific T wave abnormality Abnormal ECG When compared with ECG of 24-Oct-2023 16:19, Nonspecific T wave abnormality has replaced inverted T waves in Inferior leads T wave inversion no longer evident  in Anterior leads Confirmed by Neomi Neptune [id number]) on 11/19/2023 12:01:06 AM          RADIOLOGY: My personal review and interpretation of imaging: Patient has left intertrochanteric hip fracture.  CT head and neck unremarkable.  I have personally reviewed all radiology reports. MR BRAIN WO CONTRAST Result Date: 11/19/2023 CLINICAL DATA:  Neuro deficit, acute, stroke suspected EXAM: MRI HEAD WITHOUT CONTRAST TECHNIQUE: Multiplanar, multiecho pulse sequences of the brain and surrounding structures were obtained without intravenous contrast. COMPARISON:  CT head 11/19/2023. FINDINGS: Brain: Acute infarct in the posterior limb of the right internal capsule superiorly. Possible punctate acute infarct in the left middle cerebellar peduncle versus artifact (see series 5, image 15). Remote lacunar infarcts in the pons, right corona radiata and left basal ganglia. Scattered T2/FLAIR hyperintensities the white matter, compatible with chronic microvascular ischemic disease. No acute hemorrhage, mass lesion, midline shift or hydrocephalus. Cerebral atrophy. Evidence of prior hemorrhage in the inferior left basal ganglia. Vascular: Major arterial flow voids are maintained at the skull base. Skull and upper cervical spine: Normal marrow signal. Sinuses/Orbits: Negative. Other: No mastoid effusions. IMPRESSION: 1. Acute infarct in the posterior limb of the right internal capsule superiorly. 2. Possible punctate  acute infarct in the left middle cerebellar peduncle versus artifact. 3. Remote lacunar infarcts in the pons, left basal ganglia and right corona radiata. Electronically Signed   By: Gilmore GORMAN Molt M.D.   On: 11/19/2023 03:39   DG Chest Portable 1 View Result Date: 11/19/2023 CLINICAL DATA:  Left hip fracture sent for preoperative evaluation. EXAM: PORTABLE CHEST 1 VIEW COMPARISON:  March 01, 2023 FINDINGS: The cardiac silhouette is enlarged and unchanged in size. Mild, stable atelectasis and/or scarring is seen within the left lung base. No pleural effusion or pneumothorax is identified. Multilevel degenerative changes seen throughout the thoracic spine. IMPRESSION: Stable cardiomegaly with mild left basilar atelectasis and/or scarring. Electronically Signed   By: Suzen Dials M.D.   On: 11/19/2023 02:44   CT Hip Left Wo Contrast Result Date: 11/19/2023 CLINICAL DATA:  Status post fall. EXAM: CT OF THE LEFT HIP WITHOUT CONTRAST TECHNIQUE: Multidetector CT imaging of the left hip was performed according to the standard protocol. Multiplanar CT image reconstructions were also generated. RADIATION DOSE REDUCTION: This exam was performed according to the departmental dose-optimization program which includes automated exposure control, adjustment of the mA and/or kV according to patient size and/or use of iterative reconstruction technique. COMPARISON:  None Available. FINDINGS: Bones/Joint/Cartilage An acute, nondisplaced inter trochanteric fracture of the proximal left femur is seen. There is no evidence of dislocation. No significant arthropathy or focal bone lesions are identified. Ligaments Suboptimally assessed by CT. Muscles and Tendons Limited in evaluation and otherwise unremarkable. Soft tissues Very mild subcutaneous inflammatory fat stranding is seen along the lateral aspect of the left hip. IMPRESSION: Acute, nondisplaced intertrochanteric fracture of the proximal left femur. Electronically  Signed   By: Suzen Dials M.D.   On: 11/19/2023 01:36   CT Cervical Spine Wo Contrast Result Date: 11/19/2023 CLINICAL DATA:  Dizziness with subsequent fall. EXAM: CT CERVICAL SPINE WITHOUT CONTRAST TECHNIQUE: Multidetector CT imaging of the cervical spine was performed without intravenous contrast. Multiplanar CT image reconstructions were also generated. RADIATION DOSE REDUCTION: This exam was performed according to the departmental dose-optimization program which includes automated exposure control, adjustment of the mA and/or kV according to patient size and/or use of iterative reconstruction technique. COMPARISON:  October 24, 2023 FINDINGS: Alignment: There is reversal of the normal  cervical spine lordosis. Skull base and vertebrae: No acute fracture. No primary bone lesion or focal pathologic process. Soft tissues and spinal canal: No prevertebral fluid or swelling. No visible canal hematoma. Disc levels: Marked severity multilevel endplate sclerosis, anterior osteophyte formation and posterior bony spurring are seen at the levels of C3-C4, C4-C5, C5-C6, C6-C7 and C7-T1. Marked severity intervertebral disc space narrowing is also seen at C3-C4, C4-C5, C5-C6, C6-C7 and C7-T1. Bilateral marked severity multilevel facet joint hypertrophy is noted. Upper chest: Negative. Other: None. IMPRESSION: Marked severity multilevel degenerative changes without evidence of an acute fracture or subluxation. Electronically Signed   By: Suzen Dials M.D.   On: 11/19/2023 01:00   CT HEAD WO CONTRAST ( ) Result Date: 11/19/2023 CLINICAL DATA:  Dizziness and subsequent fall. EXAM: CT HEAD WITHOUT CONTRAST TECHNIQUE: Contiguous axial images were obtained from the base of the skull through the vertex without intravenous contrast. RADIATION DOSE REDUCTION: This exam was performed according to the departmental dose-optimization program which includes automated exposure control, adjustment of the mA and/or kV according  to patient size and/or use of iterative reconstruction technique. COMPARISON:  None Available. FINDINGS: Brain: There is generalized cerebral atrophy with widening of the extra-axial spaces and ventricular dilatation. There are areas of decreased attenuation within the white matter tracts of the supratentorial brain, consistent with microvascular disease changes. Chronic left basal ganglia lacunar infarcts are seen. Vascular: No hyperdense vessel or unexpected calcification. Skull: Normal. Negative for fracture or focal lesion. Sinuses/Orbits: No acute finding. Other: None. IMPRESSION: 1. Generalized cerebral atrophy and microvascular disease changes of the supratentorial brain. 2. Chronic left basal ganglia lacunar infarcts. 3. No acute intracranial abnormality. Electronically Signed   By: Suzen Dials M.D.   On: 11/19/2023 00:58   DG Hip Unilat With Pelvis 2-3 Views Left Result Date: 11/19/2023 CLINICAL DATA:  Dizziness with subsequent fall. EXAM: DG HIP (WITH OR WITHOUT PELVIS) 2-3V LEFT COMPARISON:  None Available. FINDINGS: There is no evidence of acute hip fracture or dislocation. Mild degenerative changes are seen involving both hips in the form of joint space narrowing. Marked severity degenerative changes are present within the visualized portion of the lower lumbar spine. IMPRESSION: No acute osseous abnormality. Electronically Signed   By: Suzen Dials M.D.   On: 11/19/2023 00:53     PROCEDURES:  Critical Care performed: Yes, see critical care procedure note(s)   CRITICAL CARE Performed by: Josette Annette Bertelson   Total critical care time: 30 minutes  Critical care time was exclusive of separately billable procedures and treating other patients.  Critical care was necessary to treat or prevent imminent or life-threatening deterioration.  Critical care was time spent personally by me on the following activities: development of treatment plan with patient and/or surrogate as well as  nursing, discussions with consultants, evaluation of patient's response to treatment, examination of patient, obtaining history from patient or surrogate, ordering and performing treatments and interventions, ordering and review of laboratory studies, ordering and review of radiographic studies, pulse oximetry and re-evaluation of patient's condition.   SABRA1-3 Lead EKG Interpretation  Performed by: Hubbert Landrigan, Josette SAILOR, DO Authorized by: Mcguire Gasparyan, Josette SAILOR, DO     Interpretation: normal     ECG rate:  91   ECG rate assessment: normal     Rhythm: sinus rhythm     Ectopy: none     Conduction: normal       IMPRESSION / MDM / ASSESSMENT AND PLAN / ED COURSE  I reviewed the triage vital signs  and the nursing notes.  Patient here with sudden onset dizziness leading to a fall with left hip injury.  The patient is on the cardiac monitor to evaluate for evidence of arrhythmia and/or significant heart rate changes.   DIFFERENTIAL DIAGNOSIS (includes but not limited to):   Left hip fracture, dislocation, intracranial hemorrhage, stroke, peripheral vertigo, anemia, electrolyte derangement, UTI, dehydration  Patient's presentation is most consistent with acute presentation with potential threat to life or bodily function.  PLAN: Will obtain labs, CT of the head and cervical spine, x-ray of the left hip.  Will give IV fluids, pain and nausea medicine.  Not having any vertigo currently.  NIH stroke scale is 0.  We discussed that this could be peripheral in nature versus due to a stroke.  Given NIH stroke scale of 0, patient not a tPA candidate at this time.  Also given her injury and concern clinically for hip fracture I do not think tPA would be appropriate.  Patient agreeable to this plan.   MEDICATIONS GIVEN IN ED: Medications  0.9 %  sodium chloride  infusion ( Intravenous New Bag/Given 11/19/23 0349)  Oral care mouth rinse (has no administration in time range)  carvedilol  (COREG ) tablet 25 mg (has no  administration in time range)  lisinopril  (ZESTRIL ) tablet 40 mg (has no administration in time range)  buPROPion  (WELLBUTRIN  SR) 12 hr tablet 150 mg (has no administration in time range)  cyanocobalamin  (VITAMIN B12) tablet 1,000 mcg (has no administration in time range)  gabapentin  (NEURONTIN ) capsule 800 mg (has no administration in time range)  calcium -vitamin D (OSCAL WITH D) 500-5 MG-MCG per tablet 1 tablet (has no administration in time range)  HYDROcodone -acetaminophen  (NORCO/VICODIN) 5-325 MG per tablet 1-2 tablet (has no administration in time range)  acetaminophen  (TYLENOL ) tablet 650 mg (has no administration in time range)  traZODone  (DESYREL ) tablet 25 mg (has no administration in time range)  ondansetron  (ZOFRAN ) injection 4 mg (has no administration in time range)  fentaNYL  (SUBLIMAZE ) injection 25 mcg (has no administration in time range)  morphine  (PF) 2 MG/ML injection 2 mg (has no administration in time range)  fentaNYL  (SUBLIMAZE ) injection 50 mcg (50 mcg Intravenous Given 11/19/23 0033)  ondansetron  (ZOFRAN ) injection 4 mg (4 mg Intravenous Given 11/19/23 0033)  sodium chloride  0.9 % bolus 500 mL (0 mLs Intravenous Stopped 11/19/23 0116)  gabapentin  (NEURONTIN ) capsule 300 mg (300 mg Oral Given 11/19/23 0116)  meclizine  (ANTIVERT ) tablet 25 mg (25 mg Oral Given 11/19/23 0148)  morphine  (PF) 4 MG/ML injection 4 mg (4 mg Intravenous Given 11/19/23 0207)     ED COURSE: CT head and cervical spine showed no acute traumatic injury.  X-ray of the left hip reviewed and interpreted by myself and the radiologist and appears to show a greater trochanter fracture.  Given the appearance of her leg on exam and level of pain I am concerned though that she likely has an intertrochanteric hip fracture.  Will obtain CT scan.  Will give further pain medication.    Patient's hemoglobin is normal.  Normal electrolytes.  Blood glucose elevated at 370 but normal bicarb and anion gap.  Troponin  minimally elevated but flat.     CT of the hip obtained and reviewed/interpreted myself and the radiologist and is concerning for an acute nondisplaced left intertrochanteric hip fracture.  Discussed the case with Dr. Lorelle and on-call for orthopedics.  He recommends obtaining MRI of the hip, keeping patient n.p.o. and potentially will go to surgery this afternoon.  Will also obtain MRI of the brain to evaluate for possible acute stroke.  Still neuro intact here.  Will discuss with the hospitalist for admission.      MRI reviewed and interpreted by myself and the radiologist.  Patient has had an acute stroke.  Not a tPA/TNK candidate given NIH stroke scale of 0 and patient needs to go potentially to the operating room for her hip fracture.  Will hold antiplatelets, anticoagulants at this time.  CONSULTS: Orthopedics consulted.  They will see patient today.   Consulted and discussed patient's case with hospitalist, Dr. Lawence.  I have recommended admission and consulting physician agrees and will place admission orders.  Patient (and family if present) agree with this plan.   I reviewed all nursing notes, vitals, pertinent previous records.  All labs, EKGs, imaging ordered have been independently reviewed and interpreted by myself.    OUTSIDE RECORDS REVIEWED: Reviewed last orthopedic note on 12/05/2022.       FINAL CLINICAL IMPRESSION(S) / ED DIAGNOSES   Final diagnoses:  Closed nondisplaced intertrochanteric fracture of left femur, initial encounter (HCC)  Fall, initial encounter  Injury of head, initial encounter  Vertigo  Acute CVA (cerebrovascular accident) (HCC)     Rx / DC Orders   ED Discharge Orders     None        Note:  This document was prepared using Dragon voice recognition software and may include unintentional dictation errors.   Ranika Mcniel, Josette SAILOR, DO 11/19/23 [phone]

## 2023-11-19 NOTE — Anesthesia Postprocedure Evaluation (Signed)
 Anesthesia Post Note  Patient: [name]  Procedure(s) Performed: FIXATION, FRACTURE, INTERTROCHANTERIC, WITH INTRAMEDULLARY ROD (Left: Hip)  Patient location during evaluation: PACU Anesthesia Type: General Level of consciousness: awake and alert, oriented and patient cooperative Pain management: pain level controlled Vital Signs Assessment: post-procedure vital signs reviewed and stable Respiratory status: spontaneous breathing, nonlabored ventilation and respiratory function stable Cardiovascular status: blood pressure returned to baseline and stable Postop Assessment: adequate PO intake Anesthetic complications: no   No notable events documented.   Last Vitals:  Vitals:   11/19/23 1515 11/19/23 1522  BP: (!) 155/78   Pulse: 88 83  Resp: 11 10  Temp:    SpO2: 95% 92%    Last Pain:  Vitals:   11/19/23 1522  TempSrc:   PainSc: Asleep                 Alfonso Ruths

## 2023-11-19 NOTE — ED Notes (Signed)
 Pt out of room in ct at this time

## 2023-11-19 NOTE — Consult Note (Signed)
 NEUROLOGY CONSULT NOTE   Date of service: November 19, 2023 Patient Name: [name] MRN:  [id number] DOB:  March 15, 1951 Reason for Consultation: Acute ischemic infarctions on MRI Requesting Provider: Dorinda Drue DASEN, MD  History of Present Illness  [name] is a 73 y.o. female with a PMHx of DM, HTN, dyslipidemia and intracranial aneurysm for which she declined intervention in 2023 at [facility], who presented to the ED after a fall onto her left side that had occurred after acute onset of vertigo while washing her dishes. She did not hit her head or lose consciousness with the fall.  She had landed on her left side and was unable to get up after the fall. Evaluation in the ED revealed that she had sustained a nondisplaced, near-complete left intertrochanteric femur fracture. She underwent a Left Hip Intramedullary nail procedure this afternoon.  MRI brain obtained overnight revealed an acute infarct in the posterior limb of the right internal capsule Superiorly, as well as a punctate acute infarct in the left middle cerebellar peduncle. 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  She states that she may have been unsteady on her feet about two weeks, which is worse on her left side. She feels that her fall was associated with abrupt onset of worsened left sided weakness.  Her home medications include ASA and atorvastatin .   ROS  She complains of some post-operative pain and fatigue. She endorses continued left sided weakness, which is new since her fall at home. Other ROS as per HPI.   Past History   Past Medical History:  Diagnosis Date   Diabetes mellitus without complication (HCC)     Past Surgical History:  Procedure Laterality Date   IR ANGIO INTRA EXTRACRAN SEL INTERNAL CAROTID BILAT MOD SED  07/05/2021   IR ANGIO VERTEBRAL SEL VERTEBRAL UNI R MOD SED  07/05/2021   IR RADIOLOGIST EVAL & MGMT  07/16/2021   IR US  GUIDE VASC  ACCESS RIGHT  07/05/2021    Family History: History reviewed. No pertinent family history.  Social History  reports that she has been smoking cigarettes. She started smoking about 50 years ago. She has a 25.3 pack-year smoking history. She has never been exposed to tobacco smoke. She has never used smokeless tobacco. She reports that she does not currently use alcohol. She reports that she does not use drugs.  Allergies  Allergen Reactions   Phenylmercuric Nitrate Other (See Comments)    BLISTERS    Medications   Current Facility-Administered Medications:    0.9 %  sodium chloride  infusion, , Intravenous, Continuous, Ward, Josette SAILOR, DO, Last Rate: 75 mL/hr at 11/19/23 1149, Infusion Verify at 11/19/23 1149   [MAR Hold] acetaminophen  (TYLENOL ) tablet 650 mg, 650 mg, Oral, Q4H PRN, Mansy, Madison LABOR, MD   [MAR Hold] buPROPion  (WELLBUTRIN  SR) 12 hr tablet 150 mg, 150 mg, Oral, BID, Mansy, Madison LABOR, MD   [MAR Hold] calcium -vitamin D (OSCAL WITH D) 500-5 MG-MCG per tablet 1 tablet, 1 tablet, Oral, Daily, Mansy, Jan A, MD   [facility] Hold] carvedilol  (COREG ) tablet 25 mg, 25 mg, Oral, BID WC, Mansy, Madison LABOR, MD   [facility] Hold] cyanocobalamin  (VITAMIN B12) tablet 1,000 mcg, 1,000 mcg, Oral, Daily, Mansy, Jan A, MD   [MAR Hold] fentaNYL  (SUBLIMAZE ) injection 25 mcg, 25 mcg, Intravenous, Q4H PRN, Mansy, Madison LABOR, MD   [facility] Hold] gabapentin  (NEURONTIN ) capsule 800 mg, 800 mg, Oral, TID, Mansy, Madison LABOR, MD   [facility]  Hold] HYDROcodone -acetaminophen  (NORCO/VICODIN) 5-325 MG per tablet 1-2 tablet, 1-2 tablet, Oral, Q6H PRN, Mansy, Madison LABOR, MD   [facility] Hold] insulin  aspart (novoLOG ) injection 0-9 Units, 0-9 Units, Subcutaneous, Q4H, Mansy, Jan A, MD, 7 Units at 11/19/23 0907   [facility] Hold] lisinopril  (ZESTRIL ) tablet 40 mg, 40 mg, Oral, Daily, Mansy, Jan A, MD   [facility] Hold] morphine  (PF) 2 MG/ML injection 2 mg, 2 mg, Intravenous, Q4H PRN, Mansy, Jan A, MD, 2 mg at 11/19/23 0912   [facility] Hold] ondansetron  (ZOFRAN ) injection 4 mg, 4 mg,  Intravenous, Q4H PRN, Mansy, Madison LABOR, MD   [facility] Hold] Oral care mouth rinse, 15 mL, Mouth Rinse, PRN, Mansy, Madison LABOR, MD   [facility] Hold] traZODone  (DESYREL ) tablet 25 mg, 25 mg, Oral, QHS PRN, Mansy, Jan A, MD  No current facility-administered medications on file prior to encounter.   Current Outpatient Medications on File Prior to Encounter  Medication Sig Dispense Refill   Calcium  Carb-Cholecalciferol  (CALCIUM  + VITAMIN D3 PO) Take 1 tablet by mouth daily.     gabapentin  (NEURONTIN ) 800 MG tablet Take 800 mg by mouth 3 (three) times daily.     metFORMIN (GLUCOPHAGE-XR) 500 MG 24 hr tablet Take 500 mg by mouth daily with breakfast.     vitamin B-12 (CYANOCOBALAMIN ) 1000 MCG tablet Take 1,000 mcg by mouth daily.     amLODipine  (NORVASC ) 5 MG tablet Take 5 mg by mouth daily. (Patient not taking: Reported on 11/19/2023)     aspirin  EC 81 MG tablet Take 81 mg by mouth daily. Swallow whole. (Patient not taking: Reported on 11/19/2023)     atorvastatin  (LIPITOR) 10 MG tablet Take 10 mg by mouth daily. (Patient not taking: Reported on 11/19/2023)     buPROPion  (ZYBAN ) 150 MG 12 hr tablet Take 150 mg by mouth 2 (two) times daily. (Patient not taking: Reported on 11/19/2023)     carvedilol  (COREG ) 25 MG tablet Take 25 mg by mouth 2 (two) times daily with a meal. (Patient not taking: Reported on 11/19/2023)     insulin  degludec (TRESIBA ) 100 UNIT/ML FlexTouch Pen Inject 40 Units into the skin daily. (Patient not taking: Reported on 11/19/2023) 3 mL 2   lisinopril  (ZESTRIL ) 40 MG tablet Take 40 mg by mouth daily. (Patient not taking: Reported on 11/19/2023)     LYRICA  100 MG capsule Take 100 mg by mouth 2 (two) times daily. (Patient not taking: Reported on 11/19/2023)     meloxicam (MOBIC) 15 MG tablet Take 15 mg by mouth daily. (Patient not taking: Reported on 11/19/2023)     traMADol  (ULTRAM ) 50 MG tablet Take 1 tablet (50 mg total) by mouth every 12 (twelve) hours as needed. (Patient not taking: Reported on 11/19/2023)  10 tablet 0   TRULICITY 0.75 MG/0.5ML SOPN Inject 0.75 mg into the skin once a week. (Patient not taking: Reported on 11/19/2023)       Vitals   Vitals:   11/19/23 0200 11/19/23 0215 11/19/23 0320 11/19/23 0750  BP: (!) 150/66  (!) 148/60 134/70  Pulse: 89 88 91 96  Resp: 17 16 16 18   Temp:   97.8 F (36.6 C) 99.5 F (37.5 C)  TempSrc:    Oral  SpO2: 97% 95% 94% 95%  Weight:      Height:        Body mass index is 26.43 kg/m.   Physical Exam   Constitutional: Appears well-developed for her age. No cachexia noted.   Psych: Subdued affect. Eyes: No scleral  injection.  HENT: No OP obstruction.  Head: Normocephalic.  Respiratory: Effort normal, non-labored breathing.    Neurologic Examination   Mental Status: Awake and alert. Fully oriented x 5. Thought content appropriate. Memory for recent events intact. Speech fluent without evidence of aphasia. Able to follow all commands without difficulty. No dysarthria.  Cranial Nerves: II: Temporal visual fields intact with no extinction to DSS. PERRL. III,IV, VI: No ptosis. EOMI. No nystagmus. V: Temp sensation decreased on the left VII: Smile symmetric but with face at rest there is subtle decreased prominence of left NL fold.  VIII: Hearing intact to voice IX,X: No hypophonia or hoarseness XI: Subtle lag on the left with shoulder shrug.  XII: Midline tongue extension Motor: RUE: 4+/5 LUE: 2-3 to 4-/5 at shoulder (unable to lift on her own, but maintains antigravity with some drift when passively elevated by examiner and then released), 4/5 more distally, except digits which cannot fully extend and grip which is 4-/5 Lower extremities unable to test proximal strength due to pain and recent operation.  RLE: Poor effort. Plantar flexes 2/5 LLE: Poor effort, barely moves foot when asked.  Sensory: Decreased temp sensation to LUE. Moves feet to touch.  Deep Tendon Reflexes: 1+ and symmetric bilateral brachioradialis. Deferred  lower extremity reflexes due to recent operation.  Cerebellar: No ataxia with FNF on the right. Unable to perform on the left.  Gait: Deferred  Labs/Imaging/Neurodiagnostic studies   CBC:  Recent Labs  Lab Dec 08, 2023 2341 11/19/23 0635  WBC 8.9 8.1  HGB 12.7 11.3*  HCT 38.9 36.1  MCV 89.4 90.3  PLT 201 173   Basic Metabolic Panel:  Lab Results  Component Value Date   NA 140 11/19/2023   K 3.7 11/19/2023   CO2 24 11/19/2023   GLUCOSE 308 (H) 11/19/2023   BUN 14 11/19/2023   CREATININE 1.08 (H) 11/19/2023   CALCIUM  8.5 (L) 11/19/2023   GFRNONAA 55 (L) 11/19/2023   GFRAA >60 04/18/2013   Lipid Panel: No results found for: LDLCALC HgbA1c:  Lab Results  Component Value Date   HGBA1C 7.7 (H) 05/26/2021   Urine Drug Screen:     Component Value Date/Time   LABOPIA NONE DETECTED 05/26/2021 0726   COCAINSCRNUR NONE DETECTED 05/26/2021 0726   LABBENZ NONE DETECTED 05/26/2021 0726   AMPHETMU NONE DETECTED 05/26/2021 0726   THCU POSITIVE (A) 05/26/2021 0726   LABBARB NONE DETECTED 05/26/2021 0726    Alcohol Level     Component Value Date/Time   ETH <10 05/26/2021 0528   INR  Lab Results  Component Value Date   INR 0.9 11/19/2023   APTT  Lab Results  Component Value Date   APTT 27 05/26/2021     ASSESSMENT  73 y.o. female with a PMHx of DM, HTN, dyslipidemia and intracranial aneurysm for which she declined intervention in 2023 at [facility], who presented to the ED after a fall onto her left side that had occurred after acute onset of vertigo while washing her dishes. She did not hit her head or lose consciousness with the fall.  She had landed on her left side and was unable to get up after the fall. Evaluation in the ED revealed that she had sustained a nondisplaced, near-complete left intertrochanteric femur fracture. She underwent a Left Hip Intramedullary nail procedure this afternoon. MRI brain obtained overnight revealed an acute infarct in the posterior limb of the  right internal capsule superiorly, as well as a punctate acute infarct in the left middle  cerebellar peduncle. 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 - Exam reveals left sided weakness and sensory numbness. - MRI brain: Acute infarct in the posterior limb of the right internal capsule superiorly. Possible punctate acute infarct in the left middle cerebellar peduncle versus artifact. Remote lacunar infarcts in the pons, left basal ganglia and right corona radiata. - ***  RECOMMENDATIONS  - Continue her home ASA and atorvastatin .  - Add Plavix  and continue DAPT indefinitely, as she has failed ASA monotherapy.   1. HgbA1c, fasting lipid panel 2. MRI/MRA of the brain without contrast 3. PT consult, OT consult, Speech consult 4. Echocardiogram 5. 80 mg of Atorvistatin 6. Prophylactic therapy-Antiplatelet med:  7. Risk factor modification 8. Telemetry monitoring 9. Frequent neuro checks 10 NPO until passes stroke swallow screen ______________________________________________________________________    Bonney SHARK, Kimberlin Scheel, MD [facility]

## 2023-11-19 NOTE — Anesthesia Preprocedure Evaluation (Addendum)
 Anesthesia Evaluation  Patient identified by MRN, date of birth, ID band Patient awake    Reviewed: Allergy & Precautions, NPO status , Patient's Chart, lab work & pertinent test results  History of Anesthesia Complications Negative for: history of anesthetic complications  Airway Mallampati: IV   Neck ROM: Full    Dental  (+) Edentulous Upper, Edentulous Lower   Pulmonary Current Smoker (1/2 ppd) and Patient abstained from smoking.   Pulmonary exam normal breath sounds clear to auscultation       Cardiovascular hypertension, Normal cardiovascular exam Rhythm:Regular Rate:Normal  ECG 11/18/23:  Normal sinus rhythm Nonspecific T wave abnormality   Neuro/Psych Cerebral artery aneruysms    GI/Hepatic negative GI ROS,,,  Endo/Other  diabetes, Type 2    Renal/GU negative Renal ROS     Musculoskeletal   Abdominal   Peds  Hematology negative hematology ROS (+)   Anesthesia Other Findings   Reproductive/Obstetrics                              Anesthesia Physical Anesthesia Plan  ASA: 3  Anesthesia Plan: General   Post-op Pain Management:    Induction: Intravenous  PONV Risk Score and Plan: 2 and Ondansetron , Dexamethasone  and Treatment may vary due to age or medical condition  Airway Management Planned: Oral ETT  Additional Equipment:   Intra-op Plan:   Post-operative Plan: Extubation in OR  Informed Consent: I have reviewed the patients History and Physical, chart, labs and discussed the procedure including the risks, benefits and alternatives for the proposed anesthesia with the patient or authorized representative who has indicated his/her understanding and acceptance.   Patient has DNR.  Discussed DNR with patient and Continue DNR.   Dental advisory given  Plan Discussed with: CRNA  Anesthesia Plan Comments: (Patient consented for risks of anesthesia including but not  limited to:  - adverse reactions to medications - damage to eyes, teeth, lips or other oral mucosa - nerve damage due to positioning  - sore throat or hoarseness - damage to heart, brain, nerves, lungs, other parts of body or loss of life  Informed patient about role of CRNA in peri- and intra-operative care.  Patient voiced understanding.)         Anesthesia Quick Evaluation

## 2023-11-19 NOTE — ED Notes (Signed)
 Admitting provider at bedside and will transport to MRI

## 2023-11-19 NOTE — Progress Notes (Addendum)
 PT Cancellation Note  Patient Details Name: [name] MRN: [id number] DOB: 1950-11-28   Cancelled Treatment:    Reason Eval/Treat Not Completed: Other (comment).  Pt found to have acute, nondisplaced intertrochanteric fracture of the proximal left femur, pending further medical workup. Please re-consult PT when patient is appropriate.   Doyal Shams PT, DPT 8:11 AM,11/19/23

## 2023-11-19 NOTE — Assessment & Plan Note (Signed)
-   The patient will be admitted to a telemetry bed. - Will be kept NPO. - Pain management will be provided. - The bed consult to be obtained. - Dr. Lorelle was notified about the patient

## 2023-11-19 NOTE — ED Notes (Signed)
 Patient transported to MRI

## 2023-11-19 NOTE — Progress Notes (Signed)
 Initial Nutrition Assessment  DOCUMENTATION CODES:   Not applicable  INTERVENTION:   -MVI with minerals daily -Glucerna Shake po TID, each supplement provides 220 kcal and 10 grams of protein  -Continue carb modified diet  NUTRITION DIAGNOSIS:   Increased nutrient needs related to post-op healing as evidenced by estimated needs.  GOAL:   Patient will meet greater than or equal to 90% of their needs  MONITOR:   PO intake, Supplement acceptance  REASON FOR ASSESSMENT:   Consult Assessment of nutrition requirement/status, Hip fracture protocol  ASSESSMENT:   Pt with medical history significant for type diabetes mellitus, essential hypertension and dyslipidemia and intracranial aneurysm for which she declined intervention in 2023 at [facility], who presented with acute onset of vertigo with subsequent fall while washing her dishes and manage left hip pain.  Pt admitted with closed lt hip fracture.   8/27- s/p Left Hip Intramedullary nail   Reviewed I/O's: +1.5 L x 24 hours  Pt down in OR at time of visit. No family present to provide additional history. RD unable to obtain further nutrition-related history or complete nutrition-focused physical exam at this time.    Pt currently NPO for procedure.   Reviewed wt hx; pt has experienced a 11.4% wt loss over the past 9 months, which is not significant for time frame. Suspect some wt loss is related to truclity.   Pt with increased nutritional needs for post-op healing and would benefit from addition of oral nutrition supplements.   Medications reviewed and include calcium -vitamin D, vitamin B-12, and neurontin .   Lab Results  Component Value Date   HGBA1C 7.7 (H) 05/26/2021   PTA DM medications are 0.75 mg trulicity weekly, .   Labs reviewed: CBGS: 184-305 (inpatient orders for glycemic control are 0-9 units insulin  aspart every 4 hours).    Diet Order:   Diet Order     None       EDUCATION NEEDS:   No education  needs have been identified at this time  Skin:  Skin Assessment: Skin Integrity Issues: Skin Integrity Issues:: Incisions Incisions: closed lt hip  Last BM:  11/14/23  Height:   Ht Readings from Last 1 Encounters:  11/18/23 5' 4 (1.626 m)    Weight:   Wt Readings from Last 1 Encounters:  11/18/23 69.9 kg    Ideal Body Weight:  54.5 kg  BMI:  Body mass index is 26.43 kg/m.  Estimated Nutritional Needs:   Kcal:  1750-1950  Protein:  90-105 grams  Fluid:  1.7-1.9 L    Margery ORN, RD, LDN, CDCES Registered Dietitian III Certified Diabetes Care and Education Specialist If unable to reach this RD, please use RD Inpatient group chat on secure chat between hours of 8am-4 pm daily

## 2023-11-19 NOTE — Assessment & Plan Note (Signed)
She was counseled for smoking cessation and will receive further counseling here. 

## 2023-11-19 NOTE — Progress Notes (Addendum)
 OT Cancellation Note  Patient Details Name: [name] MRN: [id number] DOB: Jan 11, 1951   Cancelled Treatment:    Reason Eval/Treat Not Completed: Other (comment). Pt found to have acute, nondisplaced intertrochanteric fracture of the proximal left femur awaiting ortho consult from [facility] and pending further MRIs and possible surgery this afternoon. Will sign off and can be re-consulted following surgery and once medically appropriate. [name] [name] 11/19/2023, 7:38 AM

## 2023-11-19 NOTE — ED Notes (Signed)
 PT remains in MRI and will be transported after completion of MRI

## 2023-11-20 ENCOUNTER — Inpatient Hospital Stay (HOSPITAL_COMMUNITY)
Admit: 2023-11-20 | Discharge: 2023-11-20 | Disposition: A | Discharge status: Home / Self Care | Attending: Internal Medicine | Admitting: Internal Medicine

## 2023-11-20 ENCOUNTER — Encounter: Payer: Self-pay | Admitting: Orthopedic Surgery

## 2023-11-20 DIAGNOSIS — S72002A Fracture of unspecified part of neck of left femur, initial encounter for closed fracture: Secondary | ICD-10-CM | POA: Diagnosis not present

## 2023-11-20 DIAGNOSIS — I6389 Other cerebral infarction: Secondary | ICD-10-CM | POA: Diagnosis not present

## 2023-11-20 LAB — GLUCOSE, CAPILLARY
Glucose-Capillary: 152 mg/dL — ABNORMAL HIGH (ref 70–99)
Glucose-Capillary: 196 mg/dL — ABNORMAL HIGH (ref 70–99)
Glucose-Capillary: 227 mg/dL — ABNORMAL HIGH (ref 70–99)
Glucose-Capillary: 227 mg/dL — ABNORMAL HIGH (ref 70–99)
Glucose-Capillary: 238 mg/dL — ABNORMAL HIGH (ref 70–99)
Glucose-Capillary: 270 mg/dL — ABNORMAL HIGH (ref 70–99)
Glucose-Capillary: 276 mg/dL — ABNORMAL HIGH (ref 70–99)
Glucose-Capillary: 376 mg/dL — ABNORMAL HIGH (ref 70–99)

## 2023-11-20 LAB — ECHOCARDIOGRAM COMPLETE
AR max vel: 1.1 cm2
AV Area VTI: 1.1 cm2
AV Area mean vel: 1.07 cm2
AV Mean grad: 4 mmHg
AV Peak grad: 6.7 mmHg
Ao pk vel: 1.29 m/s
Area-P 1/2: 5.58 cm2
Calc EF: 36.4 %
Height: 64 in
MV VTI: 0.99 cm2
S' Lateral: 3.77 cm
Single Plane A2C EF: 45.8 %
Single Plane A4C EF: 31.2 %
Weight: 2464 [oz_av]

## 2023-11-20 LAB — CBC
HCT: 33.9 % — ABNORMAL LOW (ref 36.0–46.0)
Hemoglobin: 10.9 g/dL — ABNORMAL LOW (ref 12.0–15.0)
MCH: 29 pg (ref 26.0–34.0)
MCHC: 32.2 g/dL (ref 30.0–36.0)
MCV: 90.2 fL (ref 80.0–100.0)
Platelets: 171 K/uL (ref 150–400)
RBC: 3.76 MIL/uL — ABNORMAL LOW (ref 3.87–5.11)
RDW: 13.6 % (ref 11.5–15.5)
WBC: 8.7 K/uL (ref 4.0–10.5)
nRBC: 0 % (ref 0.0–0.2)

## 2023-11-20 LAB — BASIC METABOLIC PANEL WITH GFR
Anion gap: 6 (ref 5–15)
BUN: 15 mg/dL (ref 8–23)
CO2: 25 mmol/L (ref 22–32)
Calcium: 8.4 mg/dL — ABNORMAL LOW (ref 8.9–10.3)
Chloride: 107 mmol/L (ref 98–111)
Creatinine, Ser: 1.2 mg/dL — ABNORMAL HIGH (ref 0.44–1.00)
GFR, Estimated: 48 mL/min — ABNORMAL LOW (ref >60)
Glucose, Bld: 222 mg/dL — ABNORMAL HIGH (ref 70–99)
Potassium: 3.8 mmol/L (ref 3.5–5.1)
Sodium: 138 mmol/L (ref 135–145)

## 2023-11-20 LAB — CK: Total CK: 81 U/L (ref 38–234)

## 2023-11-20 LAB — HEMOGLOBIN A1C
Hgb A1c MFr Bld: 12.4 % — ABNORMAL HIGH (ref 4.8–5.6)
Mean Plasma Glucose: 309 mg/dL

## 2023-11-20 MED ORDER — CLOPIDOGREL BISULFATE 75 MG PO TABS
75.0000 mg | ORAL_TABLET | Freq: Every day | ORAL | Status: DC
  Administered 2023-11-20 – 2023-11-25 (×6): 75 mg via ORAL
  Filled 2023-11-20 (×6): qty 1

## 2023-11-20 MED ORDER — ATORVASTATIN CALCIUM 20 MG PO TABS
40.0000 mg | ORAL_TABLET | Freq: Every day | ORAL | Status: DC
  Administered 2023-11-20 – 2023-11-25 (×6): 40 mg via ORAL
  Filled 2023-11-20 (×6): qty 2

## 2023-11-20 MED ORDER — LACTULOSE 10 GM/15ML PO SOLN
30.0000 g | Freq: Two times a day (BID) | ORAL | Status: DC | PRN
  Administered 2023-11-20 – 2023-11-25 (×3): 30 g via ORAL
  Filled 2023-11-20 (×3): qty 60

## 2023-11-20 MED ORDER — INSULIN GLARGINE 100 UNIT/ML ~~LOC~~ SOLN
15.0000 [IU] | SUBCUTANEOUS | Status: DC
  Administered 2023-11-20 – 2023-11-25 (×6): 15 [IU] via SUBCUTANEOUS
  Filled 2023-11-20 (×6): qty 0.15

## 2023-11-20 MED ORDER — INSULIN GLARGINE-YFGN 100 UNIT/ML ~~LOC~~ SOPN: 15.0000 [IU] | PEN_INJECTOR | SUBCUTANEOUS | Status: DC

## 2023-11-20 MED ORDER — ASPIRIN 81 MG PO TBEC
81.0000 mg | DELAYED_RELEASE_TABLET | Freq: Every day | ORAL | Status: DC
  Administered 2023-11-20 – 2023-11-25 (×6): 81 mg via ORAL
  Filled 2023-11-20 (×6): qty 1

## 2023-11-20 MED ORDER — NICOTINE 21 MG/24HR TD PT24
21.0000 mg | MEDICATED_PATCH | Freq: Every day | TRANSDERMAL | Status: DC
  Administered 2023-11-20 – 2023-11-25 (×6): 21 mg via TRANSDERMAL
  Filled 2023-11-20 (×6): qty 1

## 2023-11-20 NOTE — Evaluation (Signed)
 Physical Therapy Evaluation Patient Details Name: [name] MRN: [id number] DOB: [birth year] Today's Date: 11/20/2023  History of Present Illness  Pt is a 73 y.o. female presenting to hospital 11/18/23 with c/o fall (pt with acute onset of vertigo with subsequent fall while washing disches); c/o L side weakness.  Imaging showing acute nondisplaced intertrochanteric fx of proximal L femur; chronic appearing partial tearing of R hamstring tendon.  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  S/p L hip intramedullary nail d/t L hip intertrochanteric fx 11/19/23.  PMH includes DM, htn, HLD, intracranial aneurysm.  Clinical Impression  Prior to recent medical concerns, pt reports being modified independent ambulating with RW household distances; lives with her brother, her son, and her mom (family help take care of her mom).  L UE/LE weakness noted (see below for details).  Of note, pt reports having sudden onset of L UE weakness, difficulty walking, and impaired balance about 1 month ago but L side is weaker now.  L hip pain 0/10 at rest beginning of session (pt pre-medicated with pain medication); pain increased with activity; and L hip pain 5/10 at rest end of session (nurse notified).  Currently pt is max assist semi-supine to sitting EOB; mod assist x2 to stand from bed up to RW; and mod assist x2 to take steps bed to recliner with RW use (L elbow and L knee flexion noted when taking steps with R LE).  Limited activity d/t L hip pain.  Nurse updated on pt's status.  Pt would currently benefit from skilled PT to address noted impairments and functional limitations (see below for any additional details).  Upon hospital discharge, pt would benefit from ongoing therapy.     If plan is discharge home, recommend the following: Two people to help with walking and/or transfers;A lot of help  with bathing/dressing/bathroom;Assistance with cooking/housework;Assist for transportation;Help with stairs or ramp for entrance   Can travel by private vehicle    No    Equipment Recommendations Other (comment) (TBD at next facility)  Recommendations for Other Services       Functional Status Assessment Patient has had a recent decline in their functional status and demonstrates the ability to make significant improvements in function in a reasonable and predictable amount of time.     Precautions / Restrictions Precautions Precautions: Fall Recall of Precautions/Restrictions: Intact Restrictions Weight Bearing Restrictions Per Provider Order: Yes LLE Weight Bearing Per Provider Order: Weight bearing as tolerated      Mobility  Bed Mobility Overal bed mobility: Needs Assistance Bed Mobility: Supine to Sit     Supine to sit: Max assist, HOB elevated, Used rails     General bed mobility comments: assist for trunk and B LE's; vc's for technique    Transfers Overall transfer level: Needs assistance Equipment used: Rolling walker (2 wheels) Transfers: Sit to/from Stand, Bed to chair/wheelchair/BSC Sit to Stand: Mod assist x2   Step pivot transfers: Mod assist, +2 physical assistance       General transfer comment: x2 trials standing; assist to initiate and come to full stand; assist to control descent sitting; bed height mildly elevated; vc's for overall technique/LE sequencing, assist for balance, and assist for walker management taking steps bed to recliner with RW use (decreased stance time L LE; L elbow and L knee flexing when taking step with R LE); antalgic    Ambulation/Gait  General Gait Details: Deferred d/t safety concerns  Stairs            Wheelchair Mobility     Tilt Bed    Modified Rankin (Stroke Patients Only)       Balance Overall balance assessment: Needs assistance Sitting-balance support: Feet supported,  Bilateral upper extremity supported Sitting balance-Leahy Scale: Fair Sitting balance - Comments: steady static sitting; mild R lean d/t L hip pain Postural control: Right lateral lean Standing balance support: Bilateral upper extremity supported, Reliant on assistive device for balance Standing balance-Leahy Scale: Poor Standing balance comment: CGA to min assist x2 for static standing balance                             Pertinent Vitals/Pain Pain Assessment Pain Assessment: 0-10 Pain Score: 5  Pain Location: L hip Pain Descriptors / Indicators: Aching, Tender, Sore Pain Intervention(s): Limited activity within patient's tolerance, Monitored during session, Premedicated before session, Repositioned, Ice applied, Other (comment) (RN notified) HR 72-97 bpm and SpO2 sat 94% or greater on room air during session.    Home Living Family/patient expects to be discharged to:: Private residence Living Arrangements: Children;Other relatives (Pt's brother; pt's son; and pt's mother) Available Help at Discharge: Family;Available PRN/intermittently Type of Home: House Home Access: Ramped entrance       Home Layout: One level Home Equipment: Shower seat;Cane - single point Additional Comments: Pt has been using her mom's w/c in community at times.  Pt's brother is in/out during the day; pt's son works 10:30 am to 9 pm.  Pt (and pt's family) assist with caregiving for her mother.    Prior Function Prior Level of Function : Needs assist             Mobility Comments: Modified independent ambulating with RW household distances.  H/o falls.       Extremity/Trunk Assessment   Upper Extremity Assessment Upper Extremity Assessment: Defer to OT evaluation (Good L hand grip strength; at least 3+/5 elbow flexion/extension; at least 2+/5 L shoulder flexion)    Lower Extremity Assessment Lower Extremity Assessment: LLE deficits/detail (R LE WFL) LLE Deficits / Details: able to  wiggle toes; 0/10 L DF; poor L quad isometric strength; at least 2+/5 L hip flexion and knee flexion/extension LLE: Unable to fully assess due to pain LLE Sensation: decreased light touch LLE Coordination: decreased gross motor;decreased fine motor (limited assessment d/t L LE weakness and pain)    Cervical / Trunk Assessment Cervical / Trunk Assessment: Normal  Communication   Communication Communication: No apparent difficulties    Cognition Arousal: Alert Behavior During Therapy: WFL for tasks assessed/performed, Anxious   PT - Cognitive impairments: No apparent impairments                         Following commands: Impaired Following commands impaired: Follows one step commands with increased time     Cueing Cueing Techniques: Verbal cues, Visual cues     General Comments General comments (skin integrity, edema, etc.): No drainage noted L hip dressings.  Nursing cleared pt for participation in physical therapy.  Pt agreeable to PT session.    Exercises  Transfer training   Assessment/Plan    PT Assessment Patient needs continued PT services  PT Problem List Decreased strength;Decreased activity tolerance;Decreased balance;Decreased mobility;Decreased coordination;Decreased knowledge of use of DME;Decreased knowledge of precautions;Decreased skin integrity;Impaired sensation;Pain  PT Treatment Interventions DME instruction;Gait training;Functional mobility training;Therapeutic activities;Therapeutic exercise;Balance training;Neuromuscular re-education;Patient/family education    PT Goals (Current goals can be found in the Care Plan section)  Acute Rehab PT Goals Patient Stated Goal: to improve strength and walking PT Goal Formulation: With patient Time For Goal Achievement: 12/04/23 Potential to Achieve Goals: Good    Frequency 7X/week     Co-evaluation               AM-PAC PT 6 Clicks Mobility  Outcome Measure Help needed turning from  your back to your side while in a flat bed without using bedrails?: A Lot Help needed moving from lying on your back to sitting on the side of a flat bed without using bedrails?: A Lot Help needed moving to and from a bed to a chair (including a wheelchair)?: Total Help needed standing up from a chair using your arms (e.g., wheelchair or bedside chair)?: Total Help needed to walk in hospital room?: Total Help needed climbing 3-5 steps with a railing? : Total 6 Click Score: 8    End of Session Equipment Utilized During Treatment: Gait belt Activity Tolerance: Patient limited by pain Patient left: in chair;with call bell/phone within reach;with chair alarm set;with SCD's reapplied;Other (comment) (B heels floating via pillow support) Nurse Communication: Mobility status;Precautions;Weight bearing status;Other (comment) (Pt's pain status) PT Visit Diagnosis: Other abnormalities of gait and mobility (R26.89);Muscle weakness (generalized) (M62.81);History of falling (Z91.81);Pain Pain - Right/Left: Left Pain - part of body: Hip    Time: 9050-8969 PT Time Calculation (min) (ACUTE ONLY): 41 min   Charges:   PT Evaluation $PT Eval Low Complexity: 1 Low PT Treatments $Therapeutic Activity: 23-37 mins PT General Charges $$ ACUTE PT VISIT: 1 Visit        Damien Caulk, PT 11/20/23, 11:07 AM

## 2023-11-20 NOTE — Progress Notes (Signed)
   Subjective: 1 Day Post-Op Procedure(s) (LRB): FIXATION, FRACTURE, INTERTROCHANTERIC, WITH INTRAMEDULLARY ROD (Left) Patient reports pain as moderate.   Patient is well, and has had no acute complaints or problems We will start therapy today.    Objective: Vital signs in last 24 hours: Temp:  [97.1 F (36.2 C)-99.4 F (37.4 C)] 98.6 F (37 C) (08/28 0855) Pulse Rate:  [71-91] 91 (08/28 0855) Resp:  [10-20] 16 (08/28 0855) BP: (130-165)/(64-81) 153/81 (08/28 0855) SpO2:  [92 %-100 %] 94 % (08/28 0855)  Intake/Output from previous day: 08/27 0701 - 08/28 0700 In: 1749.8 [I.V.:1249.8; IV Piggyback:500] Out: 1575 [Urine:1500; Blood:75] Intake/Output this shift: Total I/O In: 220 [P.O.:220] Out: -   Recent Labs    11/18/23 2341 11/19/23 0635 11/20/23 0421  HGB 12.7 11.3* 10.9*   Recent Labs    11/19/23 0635 11/20/23 0421  WBC 8.1 8.7  RBC 4.00 3.76*  HCT 36.1 33.9*  PLT 173 171   Recent Labs    11/19/23 0635 11/20/23 0421  NA 140 138  K 3.7 3.8  CL 105 107  CO2 24 25  BUN 14 15  CREATININE 1.08* 1.20*  GLUCOSE 308* 222*  CALCIUM  8.5* 8.4*   Recent Labs    11/19/23 0210  INR 0.9    EXAM General - Patient is Alert, Appropriate, and Oriented Extremity - Neurovascular intact Dorsiflexion/Plantar flexion intact No cellulitis present Compartment soft Dressing - dressing C/D/I Motor Function - intact, moving foot and toes well on exam.   Past Medical History:  Diagnosis Date   Diabetes mellitus without complication (HCC)     Assessment/Plan:   1 Day Post-Op Procedure(s) (LRB): FIXATION, FRACTURE, INTERTROCHANTERIC, WITH INTRAMEDULLARY ROD (Left) Principal Problem:   Closed left hip fracture, initial encounter (HCC) Active Problems:   Essential hypertension   Type 2 diabetes mellitus with peripheral neuropathy (HCC)   Tobacco abuse   Dyslipidemia  Estimated body mass index is 26.43 kg/m as calculated from the following:   Height as of  this encounter: 5' 4 (1.626 m).   Weight as of this encounter: 69.9 kg. Advance diet Up with therapy Pain controlled VSS Labs are stable CM to assist with discharge   Patient will need 2 week follow up with [facility].  DVT Prophylaxis - Lovenox , TED hose, and SCDs Weight-Bearing as tolerated to left leg   T. Medford Amber, PA-C [facility] 11/20/2023, 10:01 AM

## 2023-11-20 NOTE — Inpatient Diabetes Management (Signed)
 Inpatient Diabetes Program Recommendations  AACE/ADA: New Consensus Statement on Inpatient Glycemic Control   Target Ranges:  Prepandial:   less than 140 mg/dL      Peak postprandial:   less than 180 mg/dL (1-2 hours)      Critically ill patients:  140 - 180 mg/dL    Latest Reference Range & Units 11/19/23 07:52 11/19/23 11:43 11/19/23 13:44 11/19/23 14:58 11/19/23 16:58 11/19/23 19:51 11/19/23 21:30 11/20/23 00:03 11/20/23 05:26  Glucose-Capillary 70 - 99 mg/dL 694 (H)  Novolog  7 units @9 :07 192 (H)     Decadron  5 mg 184 (H) 219 (H) 391 (H)  Novolog  9 units 397 (H)   376 (H)  Novolog  9 units 196 (H)  Novolog  2 units   Review of Glycemic Control  Diabetes history: DM2 Outpatient Diabetes medications: Tresiba  50 units QPM, Metformin XR 500 mg TID, Trulicity 0.75 mg Qweek (Friday) Current orders for Inpatient glycemic control: Novolog  0-9 units Q4H   Inpatient Diabetes Program Recommendations:     Insulin : Patient received Decadron  5 mg at 13:44 on 8/27 which is contributing to hyperglycemia. CBG up to 391 mg/dl last night and 803 mg/dl this morning with Novolog  correction Q4H.   Please consider ordering insulin  glargine 14 units Q24H to start now.   Thanks, Earnie Gainer, RN, MSN, CDCES Diabetes Coordinator Inpatient Diabetes Program [phone] (Team Pager from 8am to 5pm)

## 2023-11-20 NOTE — Progress Notes (Signed)
  Inpatient Rehab Admissions Coordinator :  Per therapy recommendations, patient was screened for CIR candidacy by Ottie Glazier RN MSN.  At this time patient appears to be a potential candidate for CIR. I will place a rehab consult per protocol for full assessment. Please call me with any questions.  Ottie Glazier RN MSN Admissions Coordinator [phone]

## 2023-11-20 NOTE — Evaluation (Signed)
 Occupational Therapy Evaluation Patient Details Name: [name] MRN: [id number] DOB: [birth year] Today's Date: 11/20/2023   History of Present Illness   Pt is a 73 y.o. female presenting to hospital 11/18/23 with c/o fall (pt with acute onset of vertigo with subsequent fall while washing disches); c/o L side weakness.  Imaging showing acute nondisplaced intertrochanteric fx of proximal L femur; chronic appearing partial tearing of R hamstring tendon.  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  S/p L hip intramedullary nail d/t L hip intertrochanteric fx 11/19/23.  PMH includes DM, htn, HLD, intracranial aneurysm.     Clinical Impressions Pt was seen for OT evaluation this date. PTA, pt resides in a one level home with a ramp to enter with her family. She reports being MOD I at baseline for ADL management and ambulating with a RW household distances with use of a W/C for community distances.  Pt presents with deficits in strength, balance, coordination, pain management, and activity tolerance, affecting safe and optimal ADL completion. Pt found in recliner leaning to the R to offload pressure from her L hip with reports of 8/10 pain. She has limited L shoulder AROM, but full AAROM and notable LUE weakness especially at shoulder. Good grip, but slowed dexterity and FMC to L hand. Sensation intact. Able to perform seated grooming tasks in recliner with set up assist and use of L hand for some tasks. Pt currently requires Min A x2 with cues for hand/feet placement to stand from recliner to RW and Mod A x2 for RW management and safety with cues for overall sequencing and technique to perform step pivot from recliner back to bed towards weaker L side. Mod/Max A to return to supine. Anticipate Max A for LB ADL management. Pt would benefit from skilled OT services to address noted  impairments and functional limitations to maximize safety and independence while minimizing future risk of falls, injury, and readmission. Do anticipate the need for follow up OT services upon acute hospital DC.      If plan is discharge home, recommend the following:   Two people to help with walking and/or transfers;A lot of help with bathing/dressing/bathroom;Assistance with cooking/housework;Assist for transportation;Help with stairs or ramp for entrance     Functional Status Assessment   Patient has had a recent decline in their functional status and demonstrates the ability to make significant improvements in function in a reasonable and predictable amount of time.     Equipment Recommendations   Other (comment) (defer)     Recommendations for Other Services   Rehab consult     Precautions/Restrictions   Precautions Precautions: Fall Recall of Precautions/Restrictions: Intact Restrictions Weight Bearing Restrictions Per Provider Order: Yes LLE Weight Bearing Per Provider Order: Weight bearing as tolerated     Mobility Bed Mobility Overal bed mobility: Needs Assistance Bed Mobility: Sit to Supine       Sit to supine: Max assist, Mod assist   General bed mobility comments: verb cues for technique, still plopped back in the bed; Max A for BLE management and trunk shift to center of bed, able to reposition legs to center of bed with increased time    Transfers Overall transfer level: Needs assistance Equipment used: Rolling walker (2 wheels) Transfers: Sit to/from Stand, Bed to chair/wheelchair/BSC Sit to Stand: Min assist, +2 physical assistance     Step pivot transfers: Mod  assist, +2 physical assistance     General transfer comment: cueing for forward scoot and hand/feet positioning to stand from recliner to RW with Min A x2; Mod A x2 to step pivot to weaker L side back to bed with step by step cueing for sequencing and safety as well as RW  management; noted to slide/shuffle feet at times      Balance Overall balance assessment: Needs assistance Sitting-balance support: Feet supported, Bilateral upper extremity supported Sitting balance-Leahy Scale: Fair Sitting balance - Comments: pt with significant R lean in recliner to off load weight from L hip Postural control: Right lateral lean Standing balance support: Bilateral upper extremity supported, Reliant on assistive device for balance Standing balance-Leahy Scale: Poor Standing balance comment: BUE support on RW and external support to maintain standing balance                           ADL either performed or assessed with clinical judgement   ADL Overall ADL's : Needs assistance/impaired     Grooming: Oral care;Sitting;Set up;Wash/dry face Grooming Details (indicate cue type and reason): able to perform oral care and wash her face using BUEs seated in recliner with set up assist only                                     Vision         Perception         Praxis         Pertinent Vitals/Pain Pain Assessment Pain Assessment: 0-10 Pain Score: 8  Pain Location: L hip Pain Descriptors / Indicators: Aching, Tender, Sore Pain Intervention(s): Limited activity within patient's tolerance, Monitored during session, Premedicated before session, Repositioned, Ice applied     Extremity/Trunk Assessment Upper Extremity Assessment Upper Extremity Assessment: Generalized weakness;Right hand dominant;LUE deficits/detail LUE Deficits / Details: limited ROM at shoulder and notable weakness 2+ at L shoulder, 3+ at elbow and good grip strength; full AAROM, minimal AROM at shoulder LUE Coordination: decreased fine motor   Lower Extremity Assessment Lower Extremity Assessment: Generalized weakness;LLE deficits/detail LLE Deficits / Details: able to wiggle toes; 0/10 L DF; poor L quad isometric strength; at least 2+/5 L hip flexion and knee  flexion/extension LLE: Unable to fully assess due to pain LLE Sensation: decreased light touch LLE Coordination: decreased gross motor;decreased fine motor (limited assessment d/t L LE weakness and pain)   Cervical / Trunk Assessment Cervical / Trunk Assessment: Normal   Communication Communication Communication: No apparent difficulties   Cognition Arousal: Alert Behavior During Therapy: WFL for tasks assessed/performed, Anxious                                 Following commands: Impaired Following commands impaired: Follows one step commands with increased time     Cueing  General Comments   Cueing Techniques: Verbal cues;Visual cues  No drainage noted L hip dressings   Exercises Other Exercises Other Exercises: Edu on role of OT in acute setting.   Shoulder Instructions      Home Living Family/patient expects to be discharged to:: Private residence Living Arrangements: Children;Other relatives (Pt's brother; pt's son; and pt's mother) Available Help at Discharge: Family;Available PRN/intermittently Type of Home: House Home Access: Ramped entrance     Home Layout: One level  Bathroom Shower/Tub: Chief Strategy Officer: Handicapped height     Home Equipment: Production assistant, radio - single point;Hand held shower head   Additional Comments: Pt has been using her mom's w/c in community at times.  Pt's brother is in/out during the day; pt's son works 10:30 am to 9 pm.  Pt (and pt's family) assist with caregiving for her mother.      Prior Functioning/Environment Prior Level of Function : Needs assist             Mobility Comments: Modified independent ambulating with RW household distances.  H/o falls. ADLs Comments: reports being IND with ADLs, walking to the bathroom and has shower chair    OT Problem List: Decreased strength;Decreased activity tolerance;Impaired balance (sitting and/or standing);Pain;Decreased coordination   OT  Treatment/Interventions: Self-care/ADL training;Balance training;Therapeutic exercise;Therapeutic activities;DME and/or AE instruction;Patient/family education      OT Goals(Current goals can be found in the care plan section)   Acute Rehab OT Goals Patient Stated Goal: improve pain OT Goal Formulation: With patient/family Time For Goal Achievement: 12/04/23 Potential to Achieve Goals: Fair ADL Goals Pt Will Perform Lower Body Bathing: with min assist;sit to/from stand;sitting/lateral leans;with adaptive equipment Pt Will Perform Lower Body Dressing: sitting/lateral leans;sit to/from stand;with adaptive equipment;with min assist Pt Will Transfer to Toilet: with min assist;ambulating;regular height toilet;bedside commode   OT Frequency:  Min 3X/week    Co-evaluation              AM-PAC OT 6 Clicks Daily Activity     Outcome Measure Help from another person eating meals?: None Help from another person taking care of personal grooming?: None Help from another person toileting, which includes using toliet, bedpan, or urinal?: A Lot Help from another person bathing (including washing, rinsing, drying)?: A Lot Help from another person to put on and taking off regular upper body clothing?: A Little Help from another person to put on and taking off regular lower body clothing?: A Lot 6 Click Score: 17   End of Session Equipment Utilized During Treatment: Gait belt;Rolling walker (2 wheels) Nurse Communication: Mobility status  Activity Tolerance: Patient tolerated treatment well;Patient limited by pain Patient left: in bed;with call bell/phone within reach;with bed alarm set;with family/visitor present  OT Visit Diagnosis: Other abnormalities of gait and mobility (R26.89);Unsteadiness on feet (R26.81);Muscle weakness (generalized) (M62.81)                Time: 8873-8850 OT Time Calculation (min): 23 min Charges:  OT General Charges $OT Visit: 1 Visit OT Evaluation $OT Eval  Moderate Complexity: 1 Mod OT Treatments $Self Care/Home Management : 8-22 mins Gavriela Cashin, OTR/L 11/20/23, 1:57 PM  Charnetta Wulff E Ewald Beg 11/20/2023, 1:50 PM

## 2023-11-20 NOTE — Progress Notes (Signed)
 Progress Note   Patient: [name] FMW:[id number] DOB: 06/11/1950 DOA: 11/18/2023     1 DOS: the patient was seen and examined on 11/20/2023     Brief hospital course: From HPI [name] is a 73 y.o. female with medical history significant for type diabetes mellitus, essential hypertension and dyslipidemia and intracranial aneurysm for which she declined intervention in 2023 at [facility], who presented to the emergency room with acute onset of vertigo with subsequent fall while washing her dishes and manage left hip pain.  She denies any presyncope or syncope.  She denies any bleeding diathesis.  No chest pain or palpitations.  No paresthesias or focal muscle weakness.  No witnessed seizures.  No nausea or vomiting or abdominal pain.  No cough or wheezing or dyspnea.  No chest pain or palpitations.   ED Course: When she came to the ER, BP was 157/73 with temperature 97.1 with otherwise normal vital signs.  Labs revealed a blood glucose of 370 with otherwise albumin of 3.3 and creatinine 1.15.  High-sensitivity troponin was 19 and later 18 and CBC was normal.  Blood group was A+ with negative antibody screen.  PT and INR were normal. EKG as reviewed by me : Normal sinus rhythm with rate of 81. Imaging: Noncontrasted CT scan showed marked severe multilevel degenerative changes without evidence of an acute fracture or subluxation. Chest x-ray showed no acute cardiopulmonary disease.  Hip and pelvic x-ray revealed no acute osseous abnormality. Left hip CT scan showed acute nondisplaced intertrochanteric fracture of the proximal left femur.     Assessment and Plan: Closed left hip fracture, initial encounter [facility]) - The patient will be admitted to a telemetry bed. S/p repair Orthopedics on board Continue PT OT with recommendation for inpatient rehab   Acute ischemic stroke I have consulted neurologist Continue aspirin  and Plavix  Continue statin therapy Echo reviewed showing EF of 35 to  40% I have discussed this with cardiologist and they plan to see patient as an outpatient   Tobacco abuse She was counseled for smoking cessation and will receive further counseling here.   Dyslipidemia - Will continue statin therapy.   Type 2 diabetes mellitus with peripheral neuropathy (HCC) - The patient will be placed on supplemental coverage with NovoLog . - Will continue basal coverage. - Will hold off metformin and Trulicity. - Will continue Neurontin .   Essential hypertension We will continue antihypertensive therapy.     DVT prophylaxis: SCDs.   Advanced Care Planning:  Code Status: The patient is DNR only. Family Communication: No family at bedside   Disposition Plan: Back to previous home environment     Subjective:  Denies nausea vomiting abdominal pain chest pain  Physical Exam: GENERAL: elderly female laying in bed in no acute distress LUNGS: Normal breath sounds bilaterally CARDIOVASCULAR: Regular rate and rhythm ABDOMEN: Soft, nondistended, nontender. Bowel sounds present. No organomegaly or mass.  EXTREMITIES: No pedal edema, cyanosis, or clubbing. Musculoskeletal: Left lateral hip tenderness. NEUROLOGIC: Cranial nerves II through XII are intact. Muscle strength 5/5 in all extremities. Sensation intact. Gait not checked.  PSYCHIATRIC: The patient is alert and oriented x 3.  Normal affect and good eye contact. SKIN: No obvious rash, lesion, or ulcer.     Time spent: 53 minutes   Data Reviewed:    Latest Ref Rng & Units 11/20/2023    4:21 AM 11/19/2023    6:35 AM 11/18/2023   11:41 PM  CBC  WBC 4.0 - 10.5 K/uL 8.7  8.1  8.9   Hemoglobin 12.0 - 15.0 g/dL 89.0  88.6  87.2   Hematocrit 36.0 - 46.0 % 33.9  36.1  38.9   Platelets 150 - 400 K/uL 171  173  201        Latest Ref Rng & Units 11/20/2023    4:21 AM 11/19/2023    6:35 AM 11/18/2023   11:41 PM  BMP  Glucose 70 - 99 mg/dL 777  691  629   BUN 8 - 23 mg/dL 15  14  14    Creatinine 0.44 - 1.00  mg/dL 8.79  8.91  8.84   Sodium 135 - 145 mmol/L 138  140  138   Potassium 3.5 - 5.1 mmol/L 3.8  3.7  4.2   Chloride 98 - 111 mmol/L 107  105  101   CO2 22 - 32 mmol/L 25  24  22    Calcium  8.9 - 10.3 mg/dL 8.4  8.5  8.9      Vitals:   11/20/23 0042 11/20/23 0427 11/20/23 0855 11/20/23 1401  BP: 131/64 130/66 (!) 153/81 135/86  Pulse: 78 72 91 82  Resp: 18 16 16 16   Temp: 98.6 F (37 C) 99.4 F (37.4 C) 98.6 F (37 C) 98.2 F (36.8 C)  TempSrc:  Oral    SpO2: 93% 96% 94% 96%  Weight:      Height:         Author: Drue ONEIDA Potter, MD 11/20/2023 3:24 PM  For on call review [email or web address].

## 2023-11-20 NOTE — Plan of Care (Addendum)
 CTA HEAD AND NECK:  1. Negative CTA for large vessel occlusion or other emergent finding. 2. Atheromatous change about the carotid siphons with associated mild to moderate stenoses bilaterally. 3. Atheromatous change about the PCAs with associated moderate left P2 and severe right P3 stenoses. 4. 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 5.  Aortic Atherosclerosis   TTE is pending.   A/R: 73 y.o. female presenting with an acute infarct in the posterior limb of the right internal capsule superiorly, in addition to a possible punctate acute infarct in the left middle cerebellar peduncle. - Continue her DAPT indefinitely - Continue atorvastatin  - Await result of TTE.   Addendum: TTE: 1. Left ventricular ejection fraction, by estimation, is 35 to 40%. Left  ventricular ejection fraction by 2D MOD biplane is 36.4 %. The left  ventricle has moderately decreased function. The left ventricle  demonstrates global hypokinesis. There is mild  asymmetric left ventricular hypertrophy. Left ventricular diastolic  parameters are consistent with Grade I diastolic dysfunction (impaired  relaxation).   2. Right ventricular systolic function is low normal. The right  ventricular size is normal. There is normal pulmonary artery systolic  pressure. The estimated right ventricular systolic pressure is 26.9 mmHg.   3. The mitral valve is normal in structure. Trivial mitral valve  regurgitation. No evidence of mitral stenosis.   4. Tricuspid valve regurgitation is mild to moderate.   5. The aortic valve is tricuspid. There is mild calcification of the  aortic valve. Aortic valve regurgitation is not visualized. Aortic valve  sclerosis/calcification is present, without any evidence of aortic  stenosis.   6. The inferior vena cava is normal in size with greater than 50%  respiratory variability, suggesting right atrial  pressure of 3 mmHg.  - Stroke work up is complete - No changes to above recommendations.  - Neurohospitalist service will sign off. Please call if there are additional questions.   Electronically signed: Dr. Promise Weldin

## 2023-11-21 DIAGNOSIS — S72002A Fracture of unspecified part of neck of left femur, initial encounter for closed fracture: Secondary | ICD-10-CM | POA: Diagnosis not present

## 2023-11-21 LAB — BASIC METABOLIC PANEL WITH GFR
Anion gap: 10 (ref 5–15)
BUN: 10 mg/dL (ref 8–23)
CO2: 25 mmol/L (ref 22–32)
Calcium: 8.6 mg/dL — ABNORMAL LOW (ref 8.9–10.3)
Chloride: 101 mmol/L (ref 98–111)
Creatinine, Ser: 0.89 mg/dL (ref 0.44–1.00)
GFR, Estimated: >60 mL/min (ref >60)
Glucose, Bld: 157 mg/dL — ABNORMAL HIGH (ref 70–99)
Potassium: 3.3 mmol/L — ABNORMAL LOW (ref 3.5–5.1)
Sodium: 136 mmol/L (ref 135–145)

## 2023-11-21 LAB — CBC
HCT: 35.2 % — ABNORMAL LOW (ref 36.0–46.0)
Hemoglobin: 11.5 g/dL — ABNORMAL LOW (ref 12.0–15.0)
MCH: 29.1 pg (ref 26.0–34.0)
MCHC: 32.7 g/dL (ref 30.0–36.0)
MCV: 89.1 fL (ref 80.0–100.0)
Platelets: 168 K/uL (ref 150–400)
RBC: 3.95 MIL/uL (ref 3.87–5.11)
RDW: 13.6 % (ref 11.5–15.5)
WBC: 8.3 K/uL (ref 4.0–10.5)
nRBC: 0 % (ref 0.0–0.2)

## 2023-11-21 LAB — GLUCOSE, CAPILLARY
Glucose-Capillary: 160 mg/dL — ABNORMAL HIGH (ref 70–99)
Glucose-Capillary: 208 mg/dL — ABNORMAL HIGH (ref 70–99)
Glucose-Capillary: 243 mg/dL — ABNORMAL HIGH (ref 70–99)
Glucose-Capillary: 254 mg/dL — ABNORMAL HIGH (ref 70–99)
Glucose-Capillary: 260 mg/dL — ABNORMAL HIGH (ref 70–99)
Glucose-Capillary: 314 mg/dL — ABNORMAL HIGH (ref 70–99)

## 2023-11-21 MED ORDER — AMLODIPINE BESYLATE 10 MG PO TABS
10.0000 mg | ORAL_TABLET | Freq: Every day | ORAL | Status: DC
  Administered 2023-11-21 – 2023-11-25 (×5): 10 mg via ORAL
  Filled 2023-11-21 (×5): qty 1

## 2023-11-21 MED ORDER — ENOXAPARIN SODIUM 40 MG/0.4ML IJ SOSY: 40.0000 mg | PREFILLED_SYRINGE | INTRAMUSCULAR | 0 refills | Status: DC

## 2023-11-21 MED ORDER — LOSARTAN POTASSIUM 50 MG PO TABS: 50.0000 mg | ORAL_TABLET | Freq: Every day | ORAL | Status: DC

## 2023-11-21 MED ORDER — OXYCODONE HCL 5 MG PO TABS: 5.0000 mg | ORAL_TABLET | ORAL | 0 refills | Status: DC | PRN

## 2023-11-21 MED ORDER — ACETAMINOPHEN 325 MG PO TABS: 325.0000 mg | ORAL_TABLET | Freq: Four times a day (QID) | ORAL | Status: AC | PRN

## 2023-11-21 MED ORDER — POTASSIUM CHLORIDE CRYS ER 20 MEQ PO TBCR
40.0000 meq | EXTENDED_RELEASE_TABLET | Freq: Once | ORAL | Status: AC
  Administered 2023-11-21: 40 meq via ORAL
  Filled 2023-11-21: qty 2

## 2023-11-21 NOTE — Plan of Care (Signed)
  Problem: Education: Goal: Knowledge of General Education information will improve Description: Including pain rating scale, medication(s)/side effects and non-pharmacologic comfort measures Outcome: Progressing   Problem: Health Behavior/Discharge Planning: Goal: Ability to manage health-related needs will improve Outcome: Progressing   Problem: Clinical Measurements: Goal: Ability to maintain clinical measurements within normal limits will improve Outcome: Progressing Goal: Will remain free from infection Outcome: Progressing Goal: Diagnostic test results will improve Outcome: Progressing Goal: Respiratory complications will improve Outcome: Progressing Goal: Cardiovascular complication will be avoided Outcome: Progressing   Problem: Activity: Goal: Risk for activity intolerance will decrease Outcome: Progressing   Problem: Nutrition: Goal: Adequate nutrition will be maintained Outcome: Progressing   Problem: Coping: Goal: Level of anxiety will decrease Outcome: Progressing   Problem: Elimination: Goal: Will not experience complications related to bowel motility Outcome: Progressing Goal: Will not experience complications related to urinary retention Outcome: Progressing   Problem: Pain Managment: Goal: General experience of comfort will improve and/or be controlled Outcome: Progressing   Problem: Safety: Goal: Ability to remain free from injury will improve Outcome: Progressing   Problem: Skin Integrity: Goal: Risk for impaired skin integrity will decrease Outcome: Progressing   Problem: Education: Goal: Ability to describe self-care measures that may prevent or decrease complications (Diabetes Survival Skills Education) will improve Outcome: Progressing Goal: Individualized Educational Video(s) Outcome: Progressing   Problem: Coping: Goal: Ability to adjust to condition or change in health will improve Outcome: Progressing   Problem: Fluid  Volume: Goal: Ability to maintain a balanced intake and output will improve Outcome: Progressing   Problem: Health Behavior/Discharge Planning: Goal: Ability to identify and utilize available resources and services will improve Outcome: Progressing Goal: Ability to manage health-related needs will improve Outcome: Progressing   Problem: Metabolic: Goal: Ability to maintain appropriate glucose levels will improve Outcome: Progressing   Problem: Nutritional: Goal: Maintenance of adequate nutrition will improve Outcome: Progressing Goal: Progress toward achieving an optimal weight will improve Outcome: Progressing   Problem: Skin Integrity: Goal: Risk for impaired skin integrity will decrease Outcome: Progressing   Problem: Tissue Perfusion: Goal: Adequacy of tissue perfusion will improve Outcome: Progressing   Problem: Education: Goal: Verbalization of understanding the information provided (i.e., activity precautions, restrictions, etc) will improve Outcome: Progressing Goal: Individualized Educational Video(s) Outcome: Progressing   Problem: Activity: Goal: Ability to ambulate and perform ADLs will improve Outcome: Progressing   Problem: Clinical Measurements: Goal: Postoperative complications will be avoided or minimized Outcome: Progressing   Problem: Self-Concept: Goal: Ability to maintain and perform role responsibilities to the fullest extent possible will improve Outcome: Progressing   Problem: Pain Management: Goal: Pain level will decrease Outcome: Progressing

## 2023-11-21 NOTE — TOC Initial Note (Signed)
 Transition of Care [facility]) - Initial/Assessment Note    Patient Details  Name: [name] MRN: [id number] Date of Birth: 1950-04-29  Transition of Care [facility]) CM/SW Contact:    Alvaro Louder, LCSW Phone Number: 11/21/2023, 3:48 PM  Clinical Narrative:   LCSWA met with patient at bedside to discuss PT and OT recommendation of Acute inpatient rehab. Patient was agreeable and decided to choose [facility] inpatient re[facility]. LCSWA reached out to Caitlyn, Caitlyn indicated that she tried to reach out to the patient and was not able to reach her. She stated she will reach out again in the afternoon.   TOC to follow for discharge                  Patient Goals and CMS Choice            Expected Discharge Plan and Services                                              Prior Living Arrangements/Services                       Activities of Daily Living   ADL Screening (condition at time of admission) Independently performs ADLs?: No Does the patient have a NEW difficulty with bathing/dressing/toileting/self-feeding that is expected to last >3 days?: Yes (Initiates electronic notice to provider for possible OT consult) Does the patient have a NEW difficulty with getting in/out of bed, walking, or climbing stairs that is expected to last >3 days?: Yes (Initiates electronic notice to provider for possible PT consult) Does the patient have a NEW difficulty with communication that is expected to last >3 days?: No Is the patient deaf or have difficulty hearing?: No Does the patient have difficulty seeing, even when wearing glasses/contacts?: No Does the patient have difficulty concentrating, remembering, or making decisions?: No  Permission Sought/Granted                  Emotional Assessment              Admission diagnosis:  Vertigo [R42] Injury of head, initial encounter [S09.90XA] Fall, initial encounter [W19.XXXA] Closed nondisplaced  intertrochanteric fracture of left femur, initial encounter (HCC) [S72.145A] Closed left hip fracture, initial encounter [facility]) [S72.002A] Patient Active Problem List   Diagnosis Date Noted   Closed left hip fracture, initial encounter (HCC) 11/19/2023   Essential hypertension 11/19/2023   Type 2 diabetes mellitus with peripheral neuropathy (HCC) 11/19/2023   Tobacco abuse 11/19/2023   Dyslipidemia 11/19/2023   Type 2 diabetes mellitus with hypoglycemia without coma, with long-term current use of insulin  (HCC) 05/27/2021   Hypertension 05/27/2021   Cervical spine degeneration 05/27/2021   Aortic atherosclerosis (HCC) 05/27/2021   Hypomagnesemia 05/27/2021   Syncope 05/26/2021   PCP:  Kandis Stefano Iles, MD Pharmacy:   [facility] - [place], [place] - [address] [place] [address] Phone: [phone] Fax: [phone]  [facility] [address] [place] - [address] [place] [address] Phone: [phone] Fax: [phone]     Social Drivers of Health (SDOH) Social History: SDOH Screenings   Food Insecurity: Food Insecurity Present (11/19/2023)  Housing: High Risk (11/19/2023)  Transportation Needs: No Transportation Needs (11/19/2023)  Utilities: At Risk (11/19/2023)  Social Connections: Moderately Integrated (11/19/2023)  Tobacco Use: High Risk (11/19/2023)   SDOH Interventions:     Readmission Risk Interventions     No data to display

## 2023-11-21 NOTE — Progress Notes (Signed)
 Progress Note   Patient: [name] FMW:[id number] DOB: [birth year] DOA: 11/18/2023     2 DOS: the patient was seen and examined on 11/21/2023      Brief hospital course: From HPI [name] is a 73 y.o. female with medical history significant for type diabetes mellitus, essential hypertension and dyslipidemia and intracranial aneurysm for which she declined intervention in 2023 at [facility], who presented to the emergency room with acute onset of vertigo with subsequent fall while washing her dishes and manage left hip pain.  She denies any presyncope or syncope.  She denies any bleeding diathesis.  No chest pain or palpitations.  No paresthesias or focal muscle weakness.  No witnessed seizures.  No nausea or vomiting or abdominal pain.  No cough or wheezing or dyspnea.  No chest pain or palpitations.   ED Course: When she came to the ER, BP was 157/73 with temperature 97.1 with otherwise normal vital signs.  Labs revealed a blood glucose of 370 with otherwise albumin of 3.3 and creatinine 1.15.  High-sensitivity troponin was 19 and later 18 and CBC was normal.  Blood group was A+ with negative antibody screen.  PT and INR were normal. EKG as reviewed by me : Normal sinus rhythm with rate of 81. Imaging: Noncontrasted CT scan showed marked severe multilevel degenerative changes without evidence of an acute fracture or subluxation. Chest x-ray showed no acute cardiopulmonary disease.  Hip and pelvic x-ray revealed no acute osseous abnormality. Left hip CT scan showed acute nondisplaced intertrochanteric fracture of the proximal left femur.     Assessment and Plan: Closed left hip fracture, initial encounter [facility]) - The patient will be admitted to a telemetry bed. S/p repair Orthopedics on board Continue PT OT with recommendation for inpatient rehab I discussed with TOC and they are working on this  Acute ischemic stroke I have consulted neurologist Continue aspirin  and  Plavix  Continue statin therapy Echo reviewed showing EF of 35 to 40% I have discussed this with cardiologist and they plan to see patient as an outpatient   Tobacco abuse She was counseled for smoking cessation and will receive further counseling here.   Dyslipidemia Continue statin therapy.   Type 2 diabetes mellitus with peripheral neuropathy (HCC) - The patient will be placed on supplemental coverage with NovoLog . - Will continue basal coverage. - Will hold off metformin and Trulicity. - Will continue Neurontin .   Essential hypertension Continue antihypertensive therapy.     DVT prophylaxis: SCDs.   Advanced Care Planning:  Code Status: The patient is DNR only. Family Communication: No family at bedside   Disposition Plan: Back to previous home environment     Subjective:  Denies nausea vomiting abdominal pain chest pain Still awaiting rehab placement  Physical Exam: GENERAL: elderly female laying in bed in no acute distress LUNGS: Normal breath sounds bilaterally CARDIOVASCULAR: Regular rate and rhythm ABDOMEN: Soft, nondistended, nontender. Bowel sounds present. No organomegaly or mass.  EXTREMITIES: No pedal edema, cyanosis, or clubbing. Musculoskeletal: Left lateral hip tenderness. NEUROLOGIC: Cranial nerves II through XII are intact. Muscle strength 5/5 in all extremities. Sensation intact. Gait not checked.  PSYCHIATRIC: The patient is alert and oriented x 3.  Normal affect and good eye contact. SKIN: No obvious rash, lesion, or ulcer.     Data Reviewed:    Latest Ref Rng & Units 11/21/2023    3:25 AM 11/20/2023    4:21 AM 11/19/2023    6:35 AM  BMP  Glucose 70 -  99 mg/dL 842  777  691   BUN 8 - 23 mg/dL 10  15  14    Creatinine 0.44 - 1.00 mg/dL 9.10  8.79  8.91   Sodium 135 - 145 mmol/L 136  138  140   Potassium 3.5 - 5.1 mmol/L 3.3  3.8  3.7   Chloride 98 - 111 mmol/L 101  107  105   CO2 22 - 32 mmol/L 25  25  24    Calcium  8.9 - 10.3 mg/dL 8.6  8.4   8.5        Latest Ref Rng & Units 11/21/2023    3:25 AM 11/20/2023    4:21 AM 11/19/2023    6:35 AM  CBC  WBC 4.0 - 10.5 K/uL 8.3  8.7  8.1   Hemoglobin 12.0 - 15.0 g/dL 88.4  89.0  88.6   Hematocrit 36.0 - 46.0 % 35.2  33.9  36.1   Platelets 150 - 400 K/uL 168  171  173     Vitals:   11/21/23 0600 11/21/23 0742 11/21/23 1208 11/21/23 1512  BP:  (!) 167/83 (!) 147/88 (!) 160/81  Pulse:  97 88 91  Resp:  16 16 16   Temp: 98.6 F (37 C) 99.4 F (37.4 C) 99 F (37.2 C) (!) 100.6 F (38.1 C)  TempSrc: Oral     SpO2:  96% 97% 99%  Weight:      Height:        Author: Drue ONEIDA Potter, MD 11/21/2023 3:43 PM  For on call review [email or web address].

## 2023-11-21 NOTE — PMR Pre-admission (Signed)
 PMR Admission Coordinator Pre-Admission Assessment  Patient: [name] Patient is an 73 y.o., female MRN: [id number] DOB: [birth year] Height: 5' 4 (162.6 cm) Weight: 69.9 kg  Insurance Information HMO: yes    PPO:      PCP:      IPA:      80/20:      OTHER:  PRIMARY: [employer] Medicare      Policy#: [id number]       Subscriber: pt CM Name: Erminio      Phone#: [phone]     Fax#: [phone] Pre-Cert#: [id number] auth for CIR from [place] with Home and [facility] for admit 9/1 through 9/5.  Updates due to fax listed above.        Employer:  Benefits:  Phone #: [phone]     Name:  Eff. Date: 10/24/23     Deduct: $0      Out of Pocket Max: [phone] (met $290)      Life Max:  CIR: $435/day for days 1-4      SNF: 20 full days, then $203/day Outpatient:      Co-Pay: $30/visit Home Health: 100%      Co-Pay:  DME: 80%     Co-Pay: 20% Providers:  SECONDARY:       Policy#:      Phone#:   Artist:       Phone#:    The Engineer, materials Information Summary" for patients in Inpatient Rehabilitation Facilities with attached "Privacy Act Statement-Health Care Records" was provided and verbally reviewed with: Patient and Family  Emergency Contact Information Contact Information     Name Relation Home Work Mobile   Pouliot,Curtis Jama Johann [phone]  [phone]      Other Contacts   None on File     Current Medical History  Patient Admitting Diagnosis:  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  In ED BP 157/73, with hyperglycemia, albumin 3.3, and creatinine 1.15.  CT revealed acute nondisplaced intertrochanteric fx of the proximal L femur.  Orthopedics was consulted and recommended operative management, and pt underwent IMN of L femur fx per Dr. Lorelle on 8/27.  Given nature of fall  MRI was ordered in ED and revealed an acute infarct in the posterior limb of the right internal capsule as well as a punctate infarct in the left middle cerebellar peduncle.  Neurology consulted and she endorsed unsteadiness x2 weeks.  CTA head/neck negative for LVO.  Echo revealed 35-40% EF.  Recommendations for DAPT indefinitely.  Therapy ongoing and pt was recommended for CIR.   Complete NIHSS TOTAL: 2  Patient's medical record from [facility] has been reviewed by the rehabilitation admission coordinator and physician.  Past Medical History  Past Medical History:  Diagnosis Date   Diabetes mellitus without complication (HCC)     Has the patient had major surgery during 100 days prior to admission? Yes  Family History   family history is not on file.  Current Medications  Current Facility-Administered Medications:    0.9 %  sodium chloride  infusion, , Intravenous, Continuous, Ward, Kristen N, DO, Last Rate: 75 mL/hr at 11/21/23 1229, New Bag at 11/21/23 1229   acetaminophen  (TYLENOL ) tablet 325-650 mg, 325-650 mg, Oral, Q6H PRN, Aberman, Zachary, MD, 650 mg at 11/21/23 0413  aspirin  EC tablet 81 mg, 81 mg, Oral, Daily, Djan, Drue DASEN, MD, 81 mg at 11/21/23 9145   atorvastatin  (LIPITOR) tablet 40 mg, 40 mg, Oral, Daily, Lindzen, Eric, MD, 40 mg at 11/21/23 9145   buPROPion  (WELLBUTRIN  SR) 12 hr tablet 150 mg, 150 mg, Oral, BID, Mansy, Jan A, MD, 150 mg at 11/21/23 9144   calcium -vitamin D (OSCAL WITH D) 500-5 MG-MCG per tablet 1 tablet, 1 tablet, Oral, Daily, Mansy, Jan A, MD, 1 tablet at 11/21/23 9145   carvedilol  (COREG ) tablet 25 mg, 25 mg, Oral, BID WC, Mansy, Jan A, MD, 25 mg at 11/21/23 9145   clopidogrel  (PLAVIX ) tablet 75 mg, 75 mg, Oral, Daily, Djan, Prince T, MD, 75 mg at 11/21/23 9145   cyanocobalamin  (VITAMIN B12) tablet 1,000 mcg, 1,000 mcg, Oral, Daily, Mansy, Jan A, MD, 1,000 mcg at 11/21/23 0854   docusate sodium  (COLACE) capsule 100 mg, 100 mg, Oral, BID, Aberman, Zachary,  MD, 100 mg at 11/21/23 0854   enoxaparin  (LOVENOX ) injection 40 mg, 40 mg, Subcutaneous, Q24H, Aberman, Zachary, MD, 40 mg at 11/21/23 0856   feeding supplement (GLUCERNA SHAKE) (GLUCERNA SHAKE) liquid 237 mL, 237 mL, Oral, TID BM, Djan, Drue DASEN, MD   gabapentin  (NEURONTIN ) capsule 800 mg, 800 mg, Oral, TID, Mansy, Jan A, MD, 800 mg at 11/21/23 9145   insulin  aspart (novoLOG ) injection 0-9 Units, 0-9 Units, Subcutaneous, Q4H, Mansy, Jan A, MD, 3 Units at 11/21/23 1226   insulin  glargine (LANTUS ) injection 15 Units, 15 Units, Subcutaneous, Q24H, Merrill, Kristin A, RPH, 15 Units at 11/21/23 9146   lactulose  (CHRONULAC ) 10 GM/15ML solution 30 g, 30 g, Oral, BID PRN, Djan, Prince T, MD, 30 g at 11/21/23 1044   lisinopril  (ZESTRIL ) tablet 40 mg, 40 mg, Oral, Daily, Mansy, Jan A, MD, 40 mg at 11/21/23 9145   menthol -cetylpyridinium (CEPACOL) lozenge 3 mg, 1 lozenge, Oral, PRN **OR** phenol (CHLORASEPTIC) mouth spray 1 spray, 1 spray, Mouth/Throat, PRN, Aberman, Zachary, MD   metoCLOPramide  (REGLAN ) tablet 5-10 mg, 5-10 mg, Oral, Q8H PRN **OR** metoCLOPramide  (REGLAN ) injection 5-10 mg, 5-10 mg, Intravenous, Q8H PRN, Aberman, Zachary, MD   morphine  (PF) 2 MG/ML injection 0.5-1 mg, 0.5-1 mg, Intravenous, Q2H PRN, Aberman, Zachary, MD, 1 mg at 11/21/23 1321   morphine  (PF) 2 MG/ML injection 2 mg, 2 mg, Intravenous, Q4H PRN, Mansy, Jan A, MD, 2 mg at 11/21/23 0422   multivitamin with minerals tablet 1 tablet, 1 tablet, Oral, Daily, Djan, Drue DASEN, MD, 1 tablet at 11/21/23 9145   nicotine  (NICODERM CQ  - dosed in mg/24 hours) patch 21 mg, 21 mg, Transdermal, Daily, Djan, Drue T, MD, 21 mg at 11/21/23 0908   ondansetron  (ZOFRAN ) tablet 4 mg, 4 mg, Oral, Q6H PRN **OR** ondansetron  (ZOFRAN ) injection 4 mg, 4 mg, Intravenous, Q6H PRN, Aberman, Zachary, MD   Oral care mouth rinse, 15 mL, Mouth Rinse, PRN, Mansy, Jan A, MD   oxyCODONE  (Oxy IR/ROXICODONE ) immediate release tablet 5 mg, 5 mg, Oral, Q4H PRN, Aberman,  Zachary, MD, 5 mg at 11/21/23 1136   traMADol  (ULTRAM ) tablet 50 mg, 50 mg, Oral, Q6H PRN, Aberman, Zachary, MD, 50 mg at 11/20/23 1616   traZODone  (DESYREL ) tablet 25 mg, 25 mg, Oral, QHS PRN, Mansy, Madison LABOR, MD  Patients Current Diet:  Diet Order             Diet Carb Modified Fluid consistency: Thin; Room service appropriate? Yes  Diet effective now  Precautions / Restrictions Precautions Precautions: Fall Restrictions Weight Bearing Restrictions Per Provider Order: Yes LLE Weight Bearing Per Provider Order: Weight bearing as tolerated   Has the patient had 2 or more falls or a fall with injury in the past year? Yes  Prior Activity Level Household: household ambulator with RW, doesn't drive, mod I with mobility/ADLs  Prior Functional Level Self Care: Did the patient need help bathing, dressing, using the toilet or eating? Independent  Indoor Mobility: Did the patient need assistance with walking from room to room (with or without device)? Independent  Stairs: Did the patient need assistance with internal or external stairs (with or without device)? Independent  Functional Cognition: Did the patient need help planning regular tasks such as shopping or remembering to take medications? Independent  Patient Information Are you of Hispanic, Latino/a,or Spanish origin?: A. No, not of Hispanic, Latino/a, or Spanish origin What is your race?: B. Black or African American Do you need or want an interpreter to communicate with a doctor or health care staff?: 0. No  Patient's Response To:  Health Literacy and Transportation Is the patient able to respond to health literacy and transportation needs?: Yes Health Literacy - How often do you need to have someone help you when you read instructions, pamphlets, or other written material from your doctor or pharmacy?: Never In the past 12 months, has lack of transportation kept you from medical appointments or from  getting medications?: No In the past 12 months, has lack of transportation kept you from meetings, work, or from getting things needed for daily living?: No  Journalist, newspaper / Equipment Home Equipment: Shower seat, Medical laboratory scientific officer - single point, Hand held shower head  Prior Device Use: Indicate devices/aids used by the patient prior to current illness, exacerbation or injury? Walker  Current Functional Level Cognition  Orientation Level: Oriented X4    Extremity Assessment (includes Sensation/Coordination)  Upper Extremity Assessment: Generalized weakness, Right hand dominant, LUE deficits/detail LUE Deficits / Details: limited ROM at shoulder and notable weakness 2+ at L shoulder, 3+ at elbow and good grip strength; full AAROM, minimal AROM at shoulder LUE Coordination: decreased fine motor  Lower Extremity Assessment: Generalized weakness, LLE deficits/detail LLE Deficits / Details: able to wiggle toes; 0/10 L DF; poor L quad isometric strength; at least 2+/5 L hip flexion and knee flexion/extension LLE: Unable to fully assess due to pain LLE Sensation: decreased light touch LLE Coordination: decreased gross motor, decreased fine motor (limited assessment d/t L LE weakness and pain)    ADLs  Overall ADL's : Needs assistance/impaired Eating/Feeding: Modified independent Eating/Feeding Details (indicate cue type and reason): w/ encouragement from therapist, pt able to use L UE for feeding, w/ increased time/effort Grooming: Oral care, Sitting, Set up, Wash/dry face Grooming Details (indicate cue type and reason): able to perform oral care and wash her face using BUEs seated in recliner with set up assist only    Mobility  Overal bed mobility: Needs Assistance Bed Mobility: Supine to Sit Supine to sit: Max assist, HOB elevated, Used rails Sit to supine: Max assist, Used rails General bed mobility comments: required rest breaks during supine<>sit transfers, Max A for BLE management     Transfers  Overall transfer level: Needs assistance Equipment used: Rolling walker (2 wheels) Transfers: Sit to/from Stand Sit to Stand: Max assist, From elevated surface Bed to/from chair/wheelchair/BSC transfer type:: Step pivot Step pivot transfers: Mod assist, +2 physical assistance General transfer comment: Max A w/ RW, verbal and  tactile cueing for hand placement on RW, unable to come fully into standing    Ambulation / Gait / Stairs / Wheelchair Mobility  Ambulation/Gait General Gait Details: Deferred d/t safety concerns    Posture / Balance Dynamic Sitting Balance Sitting balance - Comments: pt with significant R lean in recliner to off load weight from L hip Balance Overall balance assessment: Needs assistance Sitting-balance support: Feet supported, Bilateral upper extremity supported Sitting balance-Leahy Scale: Fair Sitting balance - Comments: pt with significant R lean in recliner to off load weight from L hip Postural control: Right lateral lean Standing balance support: Bilateral upper extremity supported, Reliant on assistive device for balance Standing balance-Leahy Scale: Poor Standing balance comment: Max A to maintain standing balance with hunched posture    Special considerations/life events  Skin surgical incision to L hip and Diabetic management yes   Previous Home Environment (from acute therapy documentation) Living Arrangements: Children, Other relatives (Pt's brother; pt's son; and pt's mother) Available Help at Discharge: Family, Available PRN/intermittently Type of Home: House Home Layout: One level Home Access: Ramped entrance Bathroom Shower/Tub: Engineer, manufacturing systems: Handicapped height Home Care Services: No Additional Comments: Pt has been using her mom's w/c in community at times.  Pt's brother is in/out during the day; pt's son works 10:30 am to 9 pm.  Pt (and pt's family) assist with caregiving for her mother.  Discharge Living  Setting Plans for Discharge Living Setting: Patient's home, Lives with (comment) (spouse) Type of Home at Discharge: House Discharge Home Layout: One level Discharge Home Access: Ramped entrance Discharge Bathroom Shower/Tub: Tub/shower unit Discharge Bathroom Toilet: Handicapped height Discharge Bathroom Accessibility: Yes How Accessible: Accessible via walker Does the patient have any problems obtaining your medications?: No  Social/Family/Support Systems Patient Roles: Spouse Anticipated Caregiver: Aisley Whan Anticipated Caregiver's Contact Information: [phone] Ability/Limitations of Caregiver: works 3rd shift Caregiver Availability: Other (Comment) Discharge Plan Discussed with Primary Caregiver: Yes Is Caregiver In Agreement with Plan?: Yes Does Caregiver/Family have Issues with Lodging/Transportation while Pt is in Rehab?: No  Goals Patient/Family Goal for Rehab: PT/OT/SLP supervision to mod I Expected length of stay: 13-15 days Additional Information: Discharge plan: return to pt's home, which is accessible.  Spouse works 3rd shift Pt/Family Agrees to Admission and willing to participate: Yes Program Orientation Provided & Reviewed with Pt/Caregiver Including Roles  & Responsibilities: Yes  Decrease burden of Care through IP rehab admission: n/a  Possible need for SNF placement upon discharge:  Not anticipated.  Plan for discharge home with support from spouse who works 3rd shift.  If pt does not progress to supervision level may require short SNF stay for continued rehab.   Patient Condition: I have reviewed medical records from [facility], spoken with [facility] team, and patient. I discussed via phone for inpatient rehabilitation assessment.  Patient will benefit from ongoing PT, OT, and SLP, can actively participate in 3 hours of therapy a day 5 days of the week, and can make measurable gains during the admission.  Patient will also benefit from the coordinated team approach  during an Inpatient Acute Rehabilitation admission.  The patient will receive intensive therapy as well as Rehabilitation physician, nursing, social worker, and care management interventions.  Due to bladder management, safety, skin/wound care, disease management, medication administration, pain management, and patient education the patient requires 24 hour a day rehabilitation nursing.  The patient is currently min to mod assist with mobility and basic ADLs.  Discharge setting and therapy post discharge at home  with home health is anticipated.  Patient has agreed to participate in the Acute Inpatient Rehabilitation Program and will admit today.  Preadmission Screen Completed By:  Reche FORBES Lowers, PT, DPT 11/21/2023 2:01 PM ______________________________________________________________________   Discussed status with Dr. Byard Carranza on 11/25/23  at 10:31 AM  and received approval for admission today.  Admission Coordinator:  Caitlin E Warren, PT, time 10:31 AM/Date 11/25/23    Assessment/Plan: Diagnosis: R internal capsule stroke Does the need for close, 24 hr/day Medical supervision in concert with the patient's rehab needs make it unreasonable for this patient to be served in a less intensive setting? Yes Co-Morbidities requiring supervision/potential complications: L hip fx s/p IM nail, DM A1c 12.4; HTN, HLD; intracranial aneurysm- delcined intervention - is DNR Due to bladder management, bowel management, safety, skin/wound care, disease management, medication administration, pain management, and patient education, does the patient require 24 hr/day rehab nursing? Yes Does the patient require coordinated care of a physician, rehab nurse, PT, OT, and SLP to address physical and functional deficits in the context of the above medical diagnosis(es)? Yes Addressing deficits in the following areas: balance, endurance, locomotion, strength, transferring, bowel/bladder control, bathing, dressing, feeding,  grooming, toileting, cognition, speech, language, and swallowing Can the patient actively participate in an intensive therapy program of at least 3 hrs of therapy 5 days a week? Yes The potential for patient to make measurable gains while on inpatient rehab is good Anticipated functional outcomes upon discharge from inpatient rehab: modified independent and supervision PT, modified independent and supervision OT, modified independent and supervision SLP Estimated rehab length of stay to reach the above functional goals is: 13-15 days Anticipated discharge destination: Home 10. Overall Rehab/Functional Prognosis: good   MD Signature:

## 2023-11-21 NOTE — Progress Notes (Signed)
   Subjective: 2 Days Post-Op Procedure(s) (LRB): FIXATION, FRACTURE, INTERTROCHANTERIC, WITH INTRAMEDULLARY ROD (Left) Patient reports pain as mild.  Patient is well, and has had no acute complaints or problems We will continue with therapy today.  Needs BM   Objective: Vital signs in last 24 hours: Temp:  [98.2 F (36.8 C)-101.7 F (38.7 C)] 99.4 F (37.4 C) (08/29 0742) Pulse Rate:  [82-97] 97 (08/29 0742) Resp:  [16-20] 16 (08/29 0742) BP: (135-174)/(71-91) 167/83 (08/29 0742) SpO2:  [94 %-100 %] 96 % (08/29 0742)  Intake/Output from previous day: 08/28 0701 - 08/29 0700 In: 2455.4 [P.O.:580; I.V.:1875.4] Out: 1550 [Urine:1550] Intake/Output this shift: No intake/output data recorded.  Recent Labs    11/18/23 2341 11/19/23 0635 11/20/23 0421 11/21/23 0325  HGB 12.7 11.3* 10.9* 11.5*   Recent Labs    11/20/23 0421 11/21/23 0325  WBC 8.7 8.3  RBC 3.76* 3.95  HCT 33.9* 35.2*  PLT 171 168   Recent Labs    11/20/23 0421 11/21/23 0325  NA 138 136  K 3.8 3.3*  CL 107 101  CO2 25 25  BUN 15 10  CREATININE 1.20* 0.89  GLUCOSE 222* 157*  CALCIUM  8.4* 8.6*   Recent Labs    11/19/23 0210  INR 0.9    EXAM General - Patient is Alert, Appropriate, and Oriented Extremity - Neurovascular intact Dorsiflexion/Plantar flexion intact No cellulitis present Compartment soft Dressing - dressing C/D/I Motor Function - intact, moving foot and toes well on exam.   Past Medical History:  Diagnosis Date   Diabetes mellitus without complication (HCC)     Assessment/Plan:   2 Days Post-Op Procedure(s) (LRB): FIXATION, FRACTURE, INTERTROCHANTERIC, WITH INTRAMEDULLARY ROD (Left) Principal Problem:   Closed left hip fracture, initial encounter (HCC) Active Problems:   Essential hypertension   Type 2 diabetes mellitus with peripheral neuropathy (HCC)   Tobacco abuse   Dyslipidemia  Estimated body mass index is 26.43 kg/m as calculated from the following:    Height as of this encounter: 5' 4 (1.626 m).   Weight as of this encounter: 69.9 kg. Advance diet Up with therapy Pain controlled VSS Labs are stable. Hgb 11.5 CM to assist with discharge   Patient will need 2 week follow up with [facility]. Lovenox  40mg  SQ daily x 14 days at discharge  DVT Prophylaxis - Lovenox , TED hose, and SCDs Weight-Bearing as tolerated to left leg   T. Medford Amber, PA-C [facility] 11/21/2023, 8:22 AM

## 2023-11-21 NOTE — Progress Notes (Signed)
 Inpatient Rehab Coordinator Note:  I spoke with patient over the phone to discuss CIR recommendations and goals/expectations of CIR stay.  We reviewed 3 hrs/day of therapy, physician follow up, and average length of stay 2 weeks (dependent upon progress) with goals of supervision.  She reports her spouse works 3rd shift so is home during the day but usually sleeping.  I was unable to reach him by phone today but will continue efforts.  I will start insurance auth request today for potential admit next week if insurance approved and bed available.    Reche Lowers, PT, DPT Admissions Coordinator [phone] 11/21/23  1:57 PM

## 2023-11-21 NOTE — Progress Notes (Signed)
 Physical Therapy Treatment Patient Details Name: [name] MRN: [id number] DOB: [birth year] Today's Date: 11/21/2023   History of Present Illness Pt is a 73 y.o. female presenting to hospital 11/18/23 with c/o fall (pt with acute onset of vertigo with subsequent fall while washing disches); c/o L side weakness.  Imaging showing acute nondisplaced intertrochanteric fx of proximal L femur; chronic appearing partial tearing of R hamstring tendon.  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  S/p L hip intramedullary nail d/t L hip intertrochanteric fx 11/19/23.  PMH includes DM, htn, HLD, intracranial aneurysm.    PT Comments  Pt resting in bed upon PT arrival; pt agreeable to therapy session; pt pre-medicated with pain medication for therapy session.  L hip pain 4/10 at rest beginning of session; pain increased to 8-10/10 during activity; and L hip pain 8/10 at rest end of session (nurse notified of pt's request for pain medication).  During session pt was max assist semi-supine to sitting EOB; max assist to stand up to RW; pt unable to take any steps with 1 assist and RW use d/t significant L hip pain; pt unable to maintain upright standing d/t L hip pain so pt assisted back to sitting on EOB; and max assist squat pivot transfer bed to recliner (to R side).  Pt appearing motivated but more pain limited today compared to yesterday.  Will continue to focus on strengthening, balance, and progressive functional mobility during hospitalization.   If plan is discharge home, recommend the following: Two people to help with walking and/or transfers;A lot of help with bathing/dressing/bathroom;Assistance with cooking/housework;Assist for transportation;Help with stairs or ramp for entrance   Can travel by private vehicle      No  Equipment Recommendations  Other (comment) (TBD at next  facility)    Recommendations for Other Services       Precautions / Restrictions Precautions Precautions: Fall Recall of Precautions/Restrictions: Intact Restrictions Weight Bearing Restrictions Per Provider Order: Yes LLE Weight Bearing Per Provider Order: Weight bearing as tolerated     Mobility  Bed Mobility Overal bed mobility: Needs Assistance Bed Mobility: Supine to Sit     Supine to sit: Max assist, HOB elevated, Used rails     General bed mobility comments: assist for trunk and B LE's; vc's for technique; use of bed rail    Transfers Overall transfer level: Needs assistance Equipment used: Rolling walker (2 wheels) Transfers: Sit to/from Stand Sit to Stand: Max assist     Squat pivot transfers: Max assist     General transfer comment: Max assist to stand from regular height bed up to RW and to control descent sitting; vc's for UE positioning and overall technique; squat pivot transfer bed to recliner to R side with cueing for technique/positioning    Ambulation/Gait               General Gait Details: Pt unable to take any steps with 1 assist (pt c/o significant L hip pain with any L LE WB'ing so unable to advance R LE); use of RW   Stairs             Wheelchair Mobility     Tilt Bed    Modified Rankin (Stroke Patients Only)       Balance Overall balance assessment: Needs assistance Sitting-balance support: Bilateral upper extremity supported, Feet supported Sitting balance-Leahy Scale: Fair Sitting  balance - Comments: steady static sitting; decreased R lean in sitting today (on EOB and in recliner) Postural control: Right lateral lean Standing balance support: Bilateral upper extremity supported, Reliant on assistive device for balance Standing balance-Leahy Scale: Fair Standing balance comment: steady static standing balance briefly but pt tending to lean forwards (d/t L hip pain) requiring intermittent cueing for upright posture  (CGA to mod assist for upright posture)                            Communication Communication Communication: No apparent difficulties  Cognition Arousal: Alert Behavior During Therapy: WFL for tasks assessed/performed, Anxious   PT - Cognitive impairments: No apparent impairments                         Following commands: Impaired Following commands impaired: Follows one step commands with increased time    Cueing Cueing Techniques: Verbal cues, Visual cues  Exercises      General Comments  Nursing cleared pt for participation in physical therapy.  Pt agreeable to PT session.      Pertinent Vitals/Pain Pain Assessment Pain Assessment: 0-10 Pain Score: 8  Pain Location: L hip Pain Descriptors / Indicators: Aching, Shooting Pain Intervention(s): Limited activity within patient's tolerance, Monitored during session, Premedicated before session, Repositioned, Patient requesting pain meds-RN notified HR 84-97 bpm and SpO2 sats 95% or greater on room air during session.    Home Living                          Prior Function            PT Goals (current goals can now be found in the care plan section) Acute Rehab PT Goals Patient Stated Goal: to improve strength and walking PT Goal Formulation: With patient Time For Goal Achievement: 12/04/23 Potential to Achieve Goals: Good Progress towards PT goals: Progressing toward goals    Frequency    7X/week      PT Plan      Co-evaluation              AM-PAC PT 6 Clicks Mobility   Outcome Measure  Help needed turning from your back to your side while in a flat bed without using bedrails?: A Lot Help needed moving from lying on your back to sitting on the side of a flat bed without using bedrails?: A Lot Help needed moving to and from a bed to a chair (including a wheelchair)?: A Lot Help needed standing up from a chair using your arms (e.g., wheelchair or bedside chair)?: A  Lot Help needed to walk in hospital room?: Total Help needed climbing 3-5 steps with a railing? : Total 6 Click Score: 10    End of Session Equipment Utilized During Treatment: Gait belt Activity Tolerance: Patient limited by pain Patient left: in chair;with call bell/phone within reach;with chair alarm set;with SCD's reapplied;Other (comment);with nursing/sitter in room (B heels floating via pillow support) Nurse Communication: Mobility status;Precautions;Weight bearing status;Patient requests pain meds PT Visit Diagnosis: Other abnormalities of gait and mobility (R26.89);Muscle weakness (generalized) (M62.81);History of falling (Z91.81);Pain Pain - Right/Left: Left Pain - part of body: Hip     Time: 8757-8679 PT Time Calculation (min) (ACUTE ONLY): 38 min  Charges:    $Therapeutic Activity: 38-52 mins PT General Charges $$ ACUTE PT VISIT: 1 Visit  Damien Caulk, PT 11/21/23, 5:27 PM

## 2023-11-21 NOTE — Progress Notes (Signed)
 Inpatient Rehab Admissions Coordinator:   Attempted to reach pt and spouse to discuss rehab recommendations.  No answer on room phone, spouse mobile no VM set up, and home number disconnected.  I will continue to try to reach them, or I can be reached at [phone].   Reche Lowers, PT, DPT Admissions Coordinator [phone] 11/21/23  10:06 AM

## 2023-11-21 NOTE — Progress Notes (Signed)
 Occupational Therapy Treatment Patient Details Name: [name] MRN: [id number] DOB: [birth year] Today's Date: 11/21/2023   History of present illness Pt is a 73 y.o. female presenting to hospital 11/18/23 with c/o fall (pt with acute onset of vertigo with subsequent fall while washing disches); c/o L side weakness.  Imaging showing acute nondisplaced intertrochanteric fx of proximal L femur; chronic appearing partial tearing of R hamstring tendon.  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  S/p L hip intramedullary nail d/t L hip intertrochanteric fx 11/19/23.  PMH includes DM, htn, HLD, intracranial aneurysm.   OT comments  [name] is motivated to engage in OT this date; however, is limited by pain and reduced endurance. Pt able to engage L UE in feeding and grooming tasks. She requires Max A for bed mobility, particularly with repositioning of L LE but also with R LE and trunk management. Pt performs sit<>stand x 6 with RW, with fluctuating results, and is never able to come fully into upright standing posture. She reports putting all weight into R LE with standing, saying it is too painful to put any weight into L LE. She endorses 8/10 pain in supine, 9/10 with standing attempts. Pt able to scoot laterally sitting EOB with Mod A, requires Max A for repositioning in supine towards HOB. She does use both R and L UE on bed rails for repositioning, with limited ROM on L. Recommending ongoing OT to facilitation return to PLOF.      If plan is discharge home, recommend the following:  Two people to help with walking and/or transfers;A lot of help with bathing/dressing/bathroom;Assistance with cooking/housework;Assist for transportation;Help with stairs or ramp for entrance   Equipment Recommendations       Recommendations for Other Services      Precautions / Restrictions  Precautions Precautions: Fall Restrictions Weight Bearing Restrictions Per Provider Order: Yes LLE Weight Bearing Per Provider Order: Weight bearing as tolerated       Mobility Bed Mobility Overal bed mobility: Needs Assistance Bed Mobility: Supine to Sit     Supine to sit: Max assist, HOB elevated, Used rails Sit to supine: Max assist, Used rails   General bed mobility comments: required rest breaks during supine<>sit transfers, Max A for BLE management    Transfers Overall transfer level: Needs assistance Equipment used: Rolling walker (2 wheels) Transfers: Sit to/from Stand Sit to Stand: Max assist, From elevated surface           General transfer comment: Max A w/ RW, verbal and tactile cueing for hand placement on RW, unable to come fully into standing     Balance Overall balance assessment: Needs assistance Sitting-balance support: Feet supported, Bilateral upper extremity supported Sitting balance-Leahy Scale: Fair   Postural control: Right lateral lean Standing balance support: Bilateral upper extremity supported, Reliant on assistive device for balance Standing balance-Leahy Scale: Poor Standing balance comment: Max A to maintain standing balance with hunched posture                           ADL either performed or assessed with clinical judgement   ADL  Extremity/Trunk Assessment Upper Extremity Assessment LUE Deficits / Details: limited ROM at shoulder and notable weakness 2+ at L shoulder, 3+ at elbow and good grip strength; full AAROM, minimal AROM at shoulder            Vision       Perception     Praxis     Communication     Cognition Arousal: Alert                                            Cueing      Exercises Other Exercises Other Exercises: Edu on role of OT in acute setting.    Shoulder Instructions       General Comments       Pertinent Vitals/ Pain       Pain Assessment Pain Score: 9  Pain Descriptors / Indicators: Aching, Shooting Pain Intervention(s): Limited activity within patient's tolerance, Monitored during session, Premedicated before session, Repositioned  Home Living                                          Prior Functioning/Environment              Frequency  Min 3X/week        Progress Toward Goals  OT Goals(current goals can now be found in the care plan section)  Progress towards OT goals: Progressing toward goals     Plan      Co-evaluation                 AM-PAC OT 6 Clicks Daily Activity     Outcome Measure   Help from another person eating meals?: None Help from another person taking care of personal grooming?: A Little Help from another person toileting, which includes using toliet, bedpan, or urinal?: A Lot Help from another person bathing (including washing, rinsing, drying)?: A Lot Help from another person to put on and taking off regular upper body clothing?: A Little Help from another person to put on and taking off regular lower body clothing?: A Lot 6 Click Score: 16    End of Session Equipment Utilized During Treatment: Rolling walker (2 wheels)  OT Visit Diagnosis: Other abnormalities of gait and mobility (R26.89);Unsteadiness on feet (R26.81);Muscle weakness (generalized) (M62.81)   Activity Tolerance Patient tolerated treatment well;Patient limited by pain   Patient Left in bed;with call bell/phone within reach;with bed alarm set   Nurse Communication          Time: 9093-9059 OT Time Calculation (min): 34 min  Charges: OT General Charges $OT Visit: 1 Visit OT Treatments $Self Care/Home Management : 23-37 mins  Suzen Hock, PhD, MS, OTR/L 11/21/23, 10:02 AM

## 2023-11-21 NOTE — Inpatient Diabetes Management (Signed)
 Inpatient Diabetes Program Recommendations  AACE/ADA: New Consensus Statement on Inpatient Glycemic Control (2015)  Target Ranges:  Prepandial:   less than 140 mg/dL      Peak postprandial:   less than 180 mg/dL (1-2 hours)      Critically ill patients:  140 - 180 mg/dL    Latest Reference Range & Units 11/20/23 08:51 11/20/23 12:00 11/20/23 12:27 11/20/23 16:28 11/20/23 20:09 11/20/23 20:49  Glucose-Capillary 70 - 99 mg/dL 847 (H) 761 (H) 772 (H) 276 (H) 227 (H) 270 (H)    Latest Reference Range & Units 11/21/23 00:08 11/21/23 04:05 11/21/23 07:38 11/21/23 11:41  Glucose-Capillary 70 - 99 mg/dL 739 (H) 839 (H) 791 (H) 243 (H)  (H): Data is abnormally high    Home DM Meds: Tresiba  50 units QPM Metformin XR 500 mg TID Trulicity 0.75 mg Qweek (Friday)   Current Orders: Lantus  15 units daily Novolog  Sensitive Correction Scale/ SSI (0-9 units) Q4 hours     MD- Please consider:  1. Increase Lantus  to 20 units Daily  2. Change Novolog  SSi to TID AC + HS  3. Start Novolog  Meal Coverage: Novolog  4 units TID with meals HOLD if pt NPO HOLD if pt eats <50% meals    --Will follow patient during hospitalization--  Adina Rudolpho Arrow RN, MSN, CDCES Diabetes Coordinator Inpatient Glycemic Control Team Team Pager: [phone] (8a-5p)

## 2023-11-22 DIAGNOSIS — S72002A Fracture of unspecified part of neck of left femur, initial encounter for closed fracture: Secondary | ICD-10-CM | POA: Diagnosis not present

## 2023-11-22 LAB — CBC
HCT: 32 % — ABNORMAL LOW (ref 36.0–46.0)
Hemoglobin: 10.3 g/dL — ABNORMAL LOW (ref 12.0–15.0)
MCH: 28.5 pg (ref 26.0–34.0)
MCHC: 32.2 g/dL (ref 30.0–36.0)
MCV: 88.6 fL (ref 80.0–100.0)
Platelets: 167 K/uL (ref 150–400)
RBC: 3.61 MIL/uL — ABNORMAL LOW (ref 3.87–5.11)
RDW: 13.5 % (ref 11.5–15.5)
WBC: 7.9 K/uL (ref 4.0–10.5)
nRBC: 0 % (ref 0.0–0.2)

## 2023-11-22 LAB — GLUCOSE, CAPILLARY
Glucose-Capillary: 198 mg/dL — ABNORMAL HIGH (ref 70–99)
Glucose-Capillary: 199 mg/dL — ABNORMAL HIGH (ref 70–99)
Glucose-Capillary: 293 mg/dL — ABNORMAL HIGH (ref 70–99)
Glucose-Capillary: 301 mg/dL — ABNORMAL HIGH (ref 70–99)
Glucose-Capillary: 308 mg/dL — ABNORMAL HIGH (ref 70–99)
Glucose-Capillary: 96 mg/dL (ref 70–99)

## 2023-11-22 LAB — BASIC METABOLIC PANEL WITH GFR
Anion gap: 7 (ref 5–15)
BUN: 8 mg/dL (ref 8–23)
CO2: 27 mmol/L (ref 22–32)
Calcium: 8.4 mg/dL — ABNORMAL LOW (ref 8.9–10.3)
Chloride: 102 mmol/L (ref 98–111)
Creatinine, Ser: 0.92 mg/dL (ref 0.44–1.00)
GFR, Estimated: >60 mL/min (ref >60)
Glucose, Bld: 121 mg/dL — ABNORMAL HIGH (ref 70–99)
Potassium: 3.4 mmol/L — ABNORMAL LOW (ref 3.5–5.1)
Sodium: 136 mmol/L (ref 135–145)

## 2023-11-22 MED ORDER — POTASSIUM CHLORIDE CRYS ER 20 MEQ PO TBCR
40.0000 meq | EXTENDED_RELEASE_TABLET | ORAL | Status: AC
  Administered 2023-11-22 (×2): 40 meq via ORAL
  Filled 2023-11-22 (×2): qty 2

## 2023-11-22 NOTE — Progress Notes (Signed)
 Physical Therapy Treatment Patient Details Name: [name] MRN: [id number] DOB: 05-17-1950 Today's Date: 11/22/2023   History of Present Illness Pt is a 73 y.o. female presenting to hospital 11/18/23 with c/o fall (pt with acute onset of vertigo with subsequent fall while washing disches); c/o L side weakness.  Imaging showing acute nondisplaced intertrochanteric fx of proximal L femur; chronic appearing partial tearing of R hamstring tendon.  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  S/p L hip intramedullary nail d/t L hip intertrochanteric fx 11/19/23.  PMH includes DM, htn, HLD, intracranial aneurysm.    PT Comments  Slight progress with bed mobility this session; pt actively assisting moving LLE with sheet around L foot.  Mod assist to stand from slightly elevated height bed up to RW and to control descent sitting; vc's for UE positioning and overall technique; Max A squat pivot transfer bed to recliner to R side with cueing for technique/positioning. Mobility continues to be significantly limited 2/2 pain in  LLE.  Continued PT will assist pt towards greater dynamic standing balance, LE strengthening, and activity tolerance to increase safety and independence and decrease burden of care with functional mobility.     If plan is discharge home, recommend the following: Two people to help with walking and/or transfers;A lot of help with bathing/dressing/bathroom;Assistance with cooking/housework;Assist for transportation;Help with stairs or ramp for entrance   Can travel by private vehicle        Equipment Recommendations       Recommendations for Other Services       Precautions / Restrictions Precautions Precautions: Fall Recall of Precautions/Restrictions: Intact Restrictions Weight Bearing Restrictions Per Provider Order: Yes LLE Weight Bearing Per Provider  Order: Weight bearing as tolerated     Mobility  Bed Mobility Overal bed mobility: Needs Assistance Bed Mobility: Supine to Sit     Supine to sit: Mod assist     General bed mobility comments: assist for trunk and LLE's  ( pt used sheet around L foot to assist moving LE to EOB, seems to have helped with the pain a little.) ; vc's for technique; use of bed rail    Transfers Overall transfer level: Needs assistance Equipment used: Rolling walker (2 wheels) Transfers: Sit to/from Stand, Bed to chair/wheelchair/BSC Sit to Stand: Mod assist     Squat pivot transfers: Max assist     General transfer comment: Mod assist to stand from slightly elevated height bed up to RW and to control descent sitting; vc's for UE positioning and overall technique; squat pivot transfer bed to recliner to R side with cueing for technique/positioning    Ambulation/Gait               General Gait Details: Pt unable to take any steps with 1 assist (pt c/o significant L hip pain with any L LE WB'ing so unable to advance R LE); use of RW   Stairs             Wheelchair Mobility     Tilt Bed    Modified Rankin (Stroke Patients Only)       Balance Overall balance assessment: Needs assistance Sitting-balance support: Bilateral upper extremity supported, Feet supported Sitting balance-Leahy Scale: Fair Sitting balance - Comments: R lean in sitting today (on EOB and in recliner) cues for weigh shifting L with UE support with increased ability to come into  midline. Postural control: Right lateral lean Standing balance support: Bilateral upper extremity supported, Reliant on assistive device for balance Standing balance-Leahy Scale: Fair Standing balance comment: steady static standing balance briefly but pt tending to lean forwards (d/t L hip pain) requiring intermittent cueing for upright posture (CGA to mod assist for upright posture)                             Communication    Cognition Arousal: Alert Behavior During Therapy: WFL for tasks assessed/performed, Anxious   PT - Cognitive impairments: No apparent impairments                         Following commands: Impaired Following commands impaired: Follows one step commands with increased time    Cueing Cueing Techniques: Verbal cues, Visual cues  Exercises      General Comments        Pertinent Vitals/Pain Pain Assessment Pain Score: 7  Pain Location: L hip Pain Descriptors / Indicators: Aching, Shooting Pain Intervention(s): Limited activity within patient's tolerance, Monitored during session, Premedicated before session    Home Living                          Prior Function            PT Goals (current goals can now be found in the care plan section) Acute Rehab PT Goals Patient Stated Goal: to improve strength and walking PT Goal Formulation: With patient Time For Goal Achievement: 12/04/23 Potential to Achieve Goals: Good Progress towards PT goals: Progressing toward goals    Frequency    7X/week      PT Plan      Co-evaluation              AM-PAC PT 6 Clicks Mobility   Outcome Measure  Help needed turning from your back to your side while in a flat bed without using bedrails?: A Lot Help needed moving from lying on your back to sitting on the side of a flat bed without using bedrails?: A Lot Help needed moving to and from a bed to a chair (including a wheelchair)?: A Lot Help needed standing up from a chair using your arms (e.g., wheelchair or bedside chair)?: A Lot Help needed to walk in hospital room?: Total Help needed climbing 3-5 steps with a railing? : Total 6 Click Score: 10    End of Session Equipment Utilized During Treatment: Gait belt Activity Tolerance: Patient limited by pain Patient left: in chair;with call bell/phone within reach;with chair alarm set;with family/visitor present Nurse Communication:  Mobility status;Precautions;Weight bearing status;Patient requests pain meds PT Visit Diagnosis: Other abnormalities of gait and mobility (R26.89);Muscle weakness (generalized) (M62.81);History of falling (Z91.81);Pain Pain - Right/Left: Left Pain - part of body: Hip     Time: 9064-8984 PT Time Calculation (min) (ACUTE ONLY): 40 min  Charges:    $Therapeutic Activity: 23-37 mins PT General Charges $$ ACUTE PT VISIT: 1 Visit                     Harland Irving, PTA  11/22/23, 10:36 AM

## 2023-11-22 NOTE — Plan of Care (Signed)
  Problem: Education: Goal: Knowledge of General Education information will improve Description: Including pain rating scale, medication(s)/side effects and non-pharmacologic comfort measures Outcome: Progressing   Problem: Health Behavior/Discharge Planning: Goal: Ability to manage health-related needs will improve Outcome: Progressing   Problem: Clinical Measurements: Goal: Ability to maintain clinical measurements within normal limits will improve Outcome: Progressing Goal: Will remain free from infection Outcome: Progressing Goal: Diagnostic test results will improve Outcome: Progressing Goal: Respiratory complications will improve Outcome: Progressing Goal: Cardiovascular complication will be avoided Outcome: Progressing   Problem: Activity: Goal: Risk for activity intolerance will decrease Outcome: Progressing   Problem: Nutrition: Goal: Adequate nutrition will be maintained Outcome: Progressing   Problem: Coping: Goal: Level of anxiety will decrease Outcome: Progressing   Problem: Elimination: Goal: Will not experience complications related to bowel motility Outcome: Progressing Goal: Will not experience complications related to urinary retention Outcome: Progressing   Problem: Pain Managment: Goal: General experience of comfort will improve and/or be controlled Outcome: Progressing   Problem: Safety: Goal: Ability to remain free from injury will improve Outcome: Progressing   Problem: Skin Integrity: Goal: Risk for impaired skin integrity will decrease Outcome: Progressing   Problem: Education: Goal: Ability to describe self-care measures that may prevent or decrease complications (Diabetes Survival Skills Education) will improve Outcome: Progressing Goal: Individualized Educational Video(s) Outcome: Progressing   Problem: Coping: Goal: Ability to adjust to condition or change in health will improve Outcome: Progressing   Problem: Fluid  Volume: Goal: Ability to maintain a balanced intake and output will improve Outcome: Progressing   Problem: Health Behavior/Discharge Planning: Goal: Ability to identify and utilize available resources and services will improve Outcome: Progressing Goal: Ability to manage health-related needs will improve Outcome: Progressing   Problem: Metabolic: Goal: Ability to maintain appropriate glucose levels will improve Outcome: Progressing   Problem: Nutritional: Goal: Maintenance of adequate nutrition will improve Outcome: Progressing Goal: Progress toward achieving an optimal weight will improve Outcome: Progressing   Problem: Skin Integrity: Goal: Risk for impaired skin integrity will decrease Outcome: Progressing   Problem: Tissue Perfusion: Goal: Adequacy of tissue perfusion will improve Outcome: Progressing   Problem: Education: Goal: Verbalization of understanding the information provided (i.e., activity precautions, restrictions, etc) will improve Outcome: Progressing Goal: Individualized Educational Video(s) Outcome: Progressing   Problem: Activity: Goal: Ability to ambulate and perform ADLs will improve Outcome: Progressing   Problem: Clinical Measurements: Goal: Postoperative complications will be avoided or minimized Outcome: Progressing   Problem: Self-Concept: Goal: Ability to maintain and perform role responsibilities to the fullest extent possible will improve Outcome: Progressing   Problem: Pain Management: Goal: Pain level will decrease Outcome: Progressing

## 2023-11-22 NOTE — Progress Notes (Signed)
 Subjective: 3 Days Post-Op Procedure(s) (LRB): FIXATION, FRACTURE, INTERTROCHANTERIC, WITH INTRAMEDULLARY ROD (Left) Patient reports pain as mild in the left left this morning. Reports she had some pain due to her underlying neuropathy last night, gabapentin  on board. Patient is well, and has had no acute complaints or problems We will continue with therapy today.  Current plan is for d/c to CIR. Patient has had a BM.   Objective: Vital signs in last 24 hours: Temp:  [98.7 F (37.1 C)-100.6 F (38.1 C)] 100 F (37.8 C) (08/30 0809) Pulse Rate:  [80-91] 84 (08/30 0809) Resp:  [15-18] 15 (08/30 0809) BP: (109-160)/(54-88) 109/67 (08/30 0809) SpO2:  [97 %-100 %] 100 % (08/30 0809)  Intake/Output from previous day: 08/29 0701 - 08/30 0700 In: 2134.8 [P.O.:580; I.V.:1554.8] Out: 800 [Urine:800] Intake/Output this shift: No intake/output data recorded.  Recent Labs    11/20/23 0421 11/21/23 0325 11/22/23 0622  HGB 10.9* 11.5* 10.3*   Recent Labs    11/21/23 0325 11/22/23 0622  WBC 8.3 7.9  RBC 3.95 3.61*  HCT 35.2* 32.0*  PLT 168 167   Recent Labs    11/21/23 0325 11/22/23 0622  NA 136 136  K 3.3* 3.4*  CL 101 102  CO2 25 27  BUN 10 8  CREATININE 0.89 0.92  GLUCOSE 157* 121*  CALCIUM  8.6* 8.4*   No results for input(s): LABPT, INR in the last 72 hours.   EXAM General - Patient is Alert, Appropriate, and Oriented Extremity - Neurovascular intact Dorsiflexion/Plantar flexion intact No cellulitis present Compartment soft Dressing - dressing C/D/I Motor Function - intact, moving foot and toes well on exam.   Past Medical History:  Diagnosis Date   Diabetes mellitus without complication (HCC)     Assessment/Plan:   3 Days Post-Op Procedure(s) (LRB): FIXATION, FRACTURE, INTERTROCHANTERIC, WITH INTRAMEDULLARY ROD (Left) Principal Problem:   Closed left hip fracture, initial encounter (HCC) Active Problems:   Essential hypertension   Type 2  diabetes mellitus with peripheral neuropathy (HCC)   Tobacco abuse   Dyslipidemia  Estimated body mass index is 26.43 kg/m as calculated from the following:   Height as of this encounter: 5' 4 (1.626 m).   Weight as of this encounter: 69.9 kg. Advance diet Up with therapy Pain controlled this morning. Gabapentin  for neuropathy. Vitals and labs reviewed, Temp 100 this AM, WBC 7.9, no signs of infection to the left hip. Hg 10.3 this AM. CM to assist with discharge, current plan is for discharge to CIR.  Patient will need 2 week follow up with [facility]. Lovenox  40mg  SQ daily x 14 days at discharge  DVT Prophylaxis - Lovenox , TED hose, and SCDs Weight-Bearing as tolerated to left leg  J. Gustavo Level, PA-C [facility] 11/22/2023, 8:29 AM

## 2023-11-22 NOTE — Progress Notes (Signed)
 Progress Note   Patient: [name] FMW:[id number] DOB: January 01, 1951 DOA: 11/18/2023     3 DOS: the patient was seen and examined on 11/22/2023     Brief hospital course: From HPI [name] is a 73 y.o. female with medical history significant for type diabetes mellitus, essential hypertension and dyslipidemia and intracranial aneurysm for which she declined intervention in 2023 at [facility], who presented to the emergency room with acute onset of vertigo with subsequent fall while washing her dishes and manage left hip pain.  She denies any presyncope or syncope.  She denies any bleeding diathesis.  No chest pain or palpitations.  No paresthesias or focal muscle weakness.  No witnessed seizures.  No nausea or vomiting or abdominal pain.  No cough or wheezing or dyspnea.  No chest pain or palpitations.   ED Course: When she came to the ER, BP was 157/73 with temperature 97.1 with otherwise normal vital signs.  Labs revealed a blood glucose of 370 with otherwise albumin of 3.3 and creatinine 1.15.  High-sensitivity troponin was 19 and later 18 and CBC was normal.  Blood group was A+ with negative antibody screen.  PT and INR were normal. EKG as reviewed by me : Normal sinus rhythm with rate of 81. Imaging: Noncontrasted CT scan showed marked severe multilevel degenerative changes without evidence of an acute fracture or subluxation. Chest x-ray showed no acute cardiopulmonary disease.  Hip and pelvic x-ray revealed no acute osseous abnormality. Left hip CT scan showed acute nondisplaced intertrochanteric fracture of the proximal left femur.     Assessment and Plan: Closed left hip fracture, initial encounter [facility]) - The patient will be admitted to a telemetry bed. S/p repair Orthopedics on board Continue PT OT with recommendation for inpatient rehab I discussed with TOC and they are working on this   Acute ischemic stroke I have consulted neurologist Continue aspirin  and  Plavix  Continue statin therapy Echo reviewed showing EF of 35 to 40% I have discussed this with cardiologist and they plan to see patient as an outpatient   Tobacco abuse She was counseled for smoking cessation and will receive further counseling here.   Dyslipidemia Continue statin therapy.   Type 2 diabetes mellitus with peripheral neuropathy (HCC) - The patient will be placed on supplemental coverage with NovoLog . - Will continue basal coverage. - Will hold off metformin  and Trulicity. - Will continue Neurontin .   Essential hypertension Continue antihypertensive therapy.     DVT prophylaxis: SCDs.   Advanced Care Planning:  Code Status: The patient is DNR only. Family Communication: No family at bedside   Disposition Plan: Back to previous home environment     Subjective:  Denies nausea vomiting abdominal pain chest pain TOC working on rehab   Physical Exam: GENERAL: elderly female laying in bed in no acute distress LUNGS: Normal breath sounds bilaterally CARDIOVASCULAR: Regular rate and rhythm ABDOMEN: Soft, nondistended, nontender. Bowel sounds present. No organomegaly or mass.  EXTREMITIES: No pedal edema, cyanosis, or clubbing. Musculoskeletal: Left lateral hip tenderness. NEUROLOGIC: Cranial nerves II through XII are intact. Muscle strength 5/5 in all extremities. Sensation intact. Gait not checked.  PSYCHIATRIC: The patient is alert and oriented x 3.  Normal affect and good eye contact. SKIN: No obvious rash, lesion, or ulcer.     Data Reviewed:     Latest Ref Rng & Units 11/22/2023    6:22 AM 11/21/2023    3:25 AM 11/20/2023    4:21 AM  CBC  WBC 4.0 - 10.5 K/uL 7.9  8.3  8.7   Hemoglobin 12.0 - 15.0 g/dL 89.6  88.4  89.0   Hematocrit 36.0 - 46.0 % 32.0  35.2  33.9   Platelets 150 - 400 K/uL 167  168  171        Latest Ref Rng & Units 11/22/2023    6:22 AM 11/21/2023    3:25 AM 11/20/2023    4:21 AM  BMP  Glucose 70 - 99 mg/dL 878  842  777   BUN 8  - 23 mg/dL 8  10  15    Creatinine 0.44 - 1.00 mg/dL 9.07  9.10  8.79   Sodium 135 - 145 mmol/L 136  136  138   Potassium 3.5 - 5.1 mmol/L 3.4  3.3  3.8   Chloride 98 - 111 mmol/L 102  101  107   CO2 22 - 32 mmol/L 27  25  25    Calcium  8.9 - 10.3 mg/dL 8.4  8.6  8.4      Vitals:   11/22/23 0443 11/22/23 0809 11/22/23 1222 11/22/23 1638  BP: (!) 112/59 109/67 123/79 117/60  Pulse: 85 84 87 81  Resp: 18 15 17 16   Temp: 99.6 F (37.6 C) 100 F (37.8 C) 99.6 F (37.6 C) 99.3 F (37.4 C)  TempSrc: Oral Oral Oral Oral  SpO2: 98% 100% 96% 96%  Weight:      Height:         Author: Drue ONEIDA Potter, MD 11/22/2023 4:52 PM  For on call review [email or web address].

## 2023-11-22 NOTE — Care Management Important Message (Signed)
 Important Message  Patient Details  Name: [name] MRN: [id number] Date of Birth: 1950-08-15   Important Message Given:  Yes - Medicare IM     [name] 11/22/2023, 2:25 PM

## 2023-11-23 DIAGNOSIS — S72002A Fracture of unspecified part of neck of left femur, initial encounter for closed fracture: Secondary | ICD-10-CM | POA: Diagnosis not present

## 2023-11-23 LAB — GLUCOSE, CAPILLARY
Glucose-Capillary: 107 mg/dL — ABNORMAL HIGH (ref 70–99)
Glucose-Capillary: 131 mg/dL — ABNORMAL HIGH (ref 70–99)
Glucose-Capillary: 184 mg/dL — ABNORMAL HIGH (ref 70–99)
Glucose-Capillary: 214 mg/dL — ABNORMAL HIGH (ref 70–99)
Glucose-Capillary: 285 mg/dL — ABNORMAL HIGH (ref 70–99)
Glucose-Capillary: 318 mg/dL — ABNORMAL HIGH (ref 70–99)
Glucose-Capillary: 367 mg/dL — ABNORMAL HIGH (ref 70–99)

## 2023-11-23 MED ORDER — METHOCARBAMOL 500 MG PO TABS
500.0000 mg | ORAL_TABLET | Freq: Three times a day (TID) | ORAL | Status: DC | PRN
  Administered 2023-11-24 (×2): 500 mg via ORAL
  Filled 2023-11-23 (×2): qty 1

## 2023-11-23 MED ORDER — DICLOFENAC SODIUM 1 % EX GEL
4.0000 g | Freq: Four times a day (QID) | CUTANEOUS | Status: DC
  Administered 2023-11-23 – 2023-11-25 (×6): 4 g via TOPICAL
  Filled 2023-11-23: qty 100

## 2023-11-23 MED ORDER — ACETAMINOPHEN 500 MG PO TABS
1000.0000 mg | ORAL_TABLET | Freq: Once | ORAL | Status: DC
  Filled 2023-11-23: qty 2

## 2023-11-23 MED ORDER — METHOCARBAMOL 500 MG PO TABS
500.0000 mg | ORAL_TABLET | Freq: Once | ORAL | Status: AC
  Administered 2023-11-23: 500 mg via ORAL
  Filled 2023-11-23: qty 1

## 2023-11-23 NOTE — Progress Notes (Signed)
 Subjective: 4 Days Post-Op Procedure(s) (LRB): FIXATION, FRACTURE, INTERTROCHANTERIC, WITH INTRAMEDULLARY ROD (Left) Patient reports pain as mild in the left left current.  Moderate pain with PT. Patient with history of neuropathy, gabapentin  on board. Patient is well, and has had no acute complaints or problems We will continue with therapy today.  Current plan is for d/c to CIR. Patient has had a BM.   Objective: Vital signs in last 24 hours: Temp:  [98.2 F (36.8 C)-99.3 F (37.4 C)] 98.3 F (36.8 C) (08/31 0722) Pulse Rate:  [77-83] 77 (08/31 0722) Resp:  [16-18] 17 (08/31 0722) BP: (112-126)/(60-71) 113/62 (08/31 0722) SpO2:  [95 %-99 %] 95 % (08/31 0722)  Intake/Output from previous day: 08/30 0701 - 08/31 0700 In: 2050.8 [P.O.:480; I.V.:1570.8] Out: 400 [Urine:400] Intake/Output this shift: Total I/O In: 240 [P.O.:240] Out: -   Recent Labs    11/21/23 0325 11/22/23 0622  HGB 11.5* 10.3*   Recent Labs    11/21/23 0325 11/22/23 0622  WBC 8.3 7.9  RBC 3.95 3.61*  HCT 35.2* 32.0*  PLT 168 167   Recent Labs    11/21/23 0325 11/22/23 0622  NA 136 136  K 3.3* 3.4*  CL 101 102  CO2 25 27  BUN 10 8  CREATININE 0.89 0.92  GLUCOSE 157* 121*  CALCIUM  8.6* 8.4*   No results for input(s): LABPT, INR in the last 72 hours.   EXAM General - Patient is Alert, Appropriate, and Oriented Extremity - Neurovascular intact Dorsiflexion/Plantar flexion intact No cellulitis present Compartment soft Dressing - dressing C/D/I Motor Function - intact, moving foot and toes well on exam.   Past Medical History:  Diagnosis Date   Diabetes mellitus without complication (HCC)     Assessment/Plan:   4 Days Post-Op Procedure(s) (LRB): FIXATION, FRACTURE, INTERTROCHANTERIC, WITH INTRAMEDULLARY ROD (Left) Principal Problem:   Closed left hip fracture, initial encounter (HCC) Active Problems:   Essential hypertension   Type 2 diabetes mellitus with peripheral  neuropathy (HCC)   Tobacco abuse   Dyslipidemia  Estimated body mass index is 26.43 kg/m as calculated from the following:   Height as of this encounter: 5' 4 (1.626 m).   Weight as of this encounter: 69.9 kg. Advance diet Up with therapy  Pain controlled.  Robaxin  added to pain regimen. Gabapentin  for neuropathy. Vitals and labs reviewed, No recent fever. CM to assist with discharge, current plan is for discharge to CIR.  Patient will need 2 week follow up with [facility]. Lovenox  40mg  SQ daily x 14 days at discharge  DVT Prophylaxis - Lovenox , TED hose, and SCDs Weight-Bearing as tolerated to left leg  J. Gustavo Level, PA-C [facility] 11/23/2023, 12:24 PM

## 2023-11-23 NOTE — Plan of Care (Signed)
  Problem: Education: Goal: Knowledge of General Education information will improve Description: Including pain rating scale, medication(s)/side effects and non-pharmacologic comfort measures Outcome: Progressing   Problem: Health Behavior/Discharge Planning: Goal: Ability to manage health-related needs will improve Outcome: Progressing   Problem: Clinical Measurements: Goal: Ability to maintain clinical measurements within normal limits will improve Outcome: Progressing Goal: Will remain free from infection Outcome: Progressing Goal: Diagnostic test results will improve Outcome: Progressing Goal: Respiratory complications will improve Outcome: Progressing Goal: Cardiovascular complication will be avoided Outcome: Progressing   Problem: Activity: Goal: Risk for activity intolerance will decrease Outcome: Progressing   Problem: Nutrition: Goal: Adequate nutrition will be maintained Outcome: Progressing   Problem: Coping: Goal: Level of anxiety will decrease Outcome: Progressing   Problem: Elimination: Goal: Will not experience complications related to bowel motility Outcome: Progressing Goal: Will not experience complications related to urinary retention Outcome: Progressing   Problem: Pain Managment: Goal: General experience of comfort will improve and/or be controlled Outcome: Progressing   Problem: Safety: Goal: Ability to remain free from injury will improve Outcome: Progressing   Problem: Skin Integrity: Goal: Risk for impaired skin integrity will decrease Outcome: Progressing   Problem: Education: Goal: Ability to describe self-care measures that may prevent or decrease complications (Diabetes Survival Skills Education) will improve Outcome: Progressing Goal: Individualized Educational Video(s) Outcome: Progressing   Problem: Coping: Goal: Ability to adjust to condition or change in health will improve Outcome: Progressing   Problem: Fluid  Volume: Goal: Ability to maintain a balanced intake and output will improve Outcome: Progressing   Problem: Health Behavior/Discharge Planning: Goal: Ability to identify and utilize available resources and services will improve Outcome: Progressing Goal: Ability to manage health-related needs will improve Outcome: Progressing   Problem: Metabolic: Goal: Ability to maintain appropriate glucose levels will improve Outcome: Progressing   Problem: Nutritional: Goal: Maintenance of adequate nutrition will improve Outcome: Progressing Goal: Progress toward achieving an optimal weight will improve Outcome: Progressing   Problem: Skin Integrity: Goal: Risk for impaired skin integrity will decrease Outcome: Progressing   Problem: Tissue Perfusion: Goal: Adequacy of tissue perfusion will improve Outcome: Progressing   Problem: Education: Goal: Verbalization of understanding the information provided (i.e., activity precautions, restrictions, etc) will improve Outcome: Progressing Goal: Individualized Educational Video(s) Outcome: Progressing   Problem: Activity: Goal: Ability to ambulate and perform ADLs will improve Outcome: Progressing   Problem: Clinical Measurements: Goal: Postoperative complications will be avoided or minimized Outcome: Progressing   Problem: Self-Concept: Goal: Ability to maintain and perform role responsibilities to the fullest extent possible will improve Outcome: Progressing   Problem: Pain Management: Goal: Pain level will decrease Outcome: Progressing

## 2023-11-23 NOTE — Progress Notes (Signed)
 Physical Therapy Treatment Patient Details Name: [name] MRN: [id number] DOB: September 29, 1950 Today's Date: 11/23/2023   History of Present Illness Pt is a 73 y.o. female presenting to hospital 11/18/23 with c/o fall (pt with acute onset of vertigo with subsequent fall while washing disches); c/o L side weakness.  Imaging showing acute nondisplaced intertrochanteric fx of proximal L femur; chronic appearing partial tearing of R hamstring tendon.  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  S/p L hip intramedullary nail d/t L hip intertrochanteric fx 11/19/23.  PMH includes DM, htn, HLD, intracranial aneurysm.    PT Comments  Pt in bed, ready to get up.  She is able to get to EOB with bed features and inc time with mod a x 1.  Has difficulty with upper body and with getting hips fully to EOB.  Once sitting, she leans left and relies on bed features for upright.  Features are taken away and posture does improve.  Supine AAROM LLE and BLE AROM in unsupported sitting with cues to continue with activity.  She does c/o general neuropathy pain stated she is talking with MD/RN regarding her gabapentin  which help with pain control.  She is able to get to recliner with mod a x 2 and increased time with heavy lean on RW and cues/education.  Remained in chair with needs met.  RN aware of request for pain meds.   If plan is discharge home, recommend the following: Two people to help with walking and/or transfers;A lot of help with bathing/dressing/bathroom;Assistance with cooking/housework;Assist for transportation;Help with stairs or ramp for entrance   Can travel by private vehicle        Equipment Recommendations       Recommendations for Other Services       Precautions / Restrictions Precautions Precautions: Fall Recall of Precautions/Restrictions: Intact Restrictions Weight  Bearing Restrictions Per Provider Order: Yes LLE Weight Bearing Per Provider Order: Weight bearing as tolerated     Mobility  Bed Mobility Overal bed mobility: Needs Assistance Bed Mobility: Supine to Sit     Supine to sit: Mod assist     General bed mobility comments: increased time and cues with assist for upper body and to get hips fully to EOB despite inc time and cues Patient Response: Cooperative  Transfers Overall transfer level: Needs assistance Equipment used: Rolling walker (2 wheels) Transfers: Sit to/from Stand Sit to Stand: Mod assist, +2 physical assistance   Step pivot transfers: Mod assist, +2 physical assistance       General transfer comment: +2 to step to recliner at bedside with RW.  flexed posture with cues and assist for walker position.    Ambulation/Gait                   Stairs             Wheelchair Mobility     Tilt Bed Tilt Bed Patient Response: Cooperative  Modified Rankin (Stroke Patients Only)       Balance Overall balance assessment: Needs assistance Sitting-balance support: Bilateral upper extremity supported, Feet supported Sitting balance-Leahy Scale: Fair Sitting balance - Comments: right lean on bed and rail for support.  cues to sit upright and not rely on bed features for support. Postural control: Right lateral lean Standing balance support: Bilateral upper extremity supported, Reliant on assistive device for balance Standing balance-Leahy Scale: Poor  Standing balance comment: +2 heavy lean on walker with poor posture.                            Communication Communication Communication: No apparent difficulties  Cognition Arousal: Alert Behavior During Therapy: WFL for tasks assessed/performed, Lability   PT - Cognitive impairments: No apparent impairments                       PT - Cognition Comments: Does become a bit teary during session due to difficlty with movement, pain  and general frustrations. Following commands: Impaired Following commands impaired: Follows one step commands with increased time    Cueing Cueing Techniques: Verbal cues, Visual cues  Exercises Other Exercises Other Exercises: supine LLE and unsupported sitting BLE AROM.  general discomfort and fear of movement due to pain.    General Comments        Pertinent Vitals/Pain Pain Assessment Pain Assessment: Faces Faces Pain Scale: Hurts even more Pain Location: L hip, knee and general neuropopthy pain Pain Descriptors / Indicators: Aching, Shooting Pain Intervention(s): Limited activity within patient's tolerance, Monitored during session, Repositioned, Patient requesting pain meds-RN notified    Home Living                          Prior Function            PT Goals (current goals can now be found in the care plan section) Progress towards PT goals: Progressing toward goals    Frequency    7X/week      PT Plan      Co-evaluation              AM-PAC PT 6 Clicks Mobility   Outcome Measure  Help needed turning from your back to your side while in a flat bed without using bedrails?: A Lot Help needed moving from lying on your back to sitting on the side of a flat bed without using bedrails?: A Lot Help needed moving to and from a bed to a chair (including a wheelchair)?: A Lot Help needed standing up from a chair using your arms (e.g., wheelchair or bedside chair)?: A Lot Help needed to walk in hospital room?: Total Help needed climbing 3-5 steps with a railing? : Total 6 Click Score: 10    End of Session Equipment Utilized During Treatment: Gait belt Activity Tolerance: Patient limited by pain Patient left: in chair;with call bell/phone within reach;with chair alarm set Nurse Communication: Mobility status;Precautions;Weight bearing status;Patient requests pain meds PT Visit Diagnosis: Other abnormalities of gait and mobility (R26.89);Muscle  weakness (generalized) (M62.81);History of falling (Z91.81);Pain Pain - Right/Left: Left Pain - part of body: Hip     Time: 9151-9083 PT Time Calculation (min) (ACUTE ONLY): 28 min  Charges:    $Therapeutic Exercise: 8-22 mins $Therapeutic Activity: 8-22 mins PT General Charges $$ ACUTE PT VISIT: 1 Visit                   Lauraine Gills, PTA 11/23/23, 9:37 AM

## 2023-11-23 NOTE — Progress Notes (Signed)
 Progress Note   Patient: [name] FMW:[id number] DOB: Jul 31, 1950 DOA: 11/18/2023     4 DOS: the patient was seen and examined on 11/23/2023   Brief hospital course: From HPI Jeffery Takeria Marquina is a 73 y.o. female with medical history significant for type diabetes mellitus, essential hypertension and dyslipidemia and intracranial aneurysm for which she declined intervention in 2023 at [facility], who presented to the emergency room with acute onset of vertigo with subsequent fall while washing her dishes and manage left hip pain.  She denies any presyncope or syncope.  She denies any bleeding diathesis.  No chest pain or palpitations.  No paresthesias or focal muscle weakness.  No witnessed seizures.  No nausea or vomiting or abdominal pain.  No cough or wheezing or dyspnea.  No chest pain or palpitations.   ED Course: When she came to the ER, BP was 157/73 with temperature 97.1 with otherwise normal vital signs.  Labs revealed a blood glucose of 370 with otherwise albumin of 3.3 and creatinine 1.15.  High-sensitivity troponin was 19 and later 18 and CBC was normal.  Blood group was A+ with negative antibody screen.  PT and INR were normal. EKG as reviewed by me : Normal sinus rhythm with rate of 81. Imaging: Noncontrasted CT scan showed marked severe multilevel degenerative changes without evidence of an acute fracture or subluxation. Chest x-ray showed no acute cardiopulmonary disease.  Hip and pelvic x-ray revealed no acute osseous abnormality. Left hip CT scan showed acute nondisplaced intertrochanteric fracture of the proximal left femur.     Assessment and Plan: Closed left hip fracture, initial encounter [facility]) - The patient will be admitted to a telemetry bed. S/p repair Orthopedics on board Continue PT OT with recommendation for inpatient rehab I discussed with TOC and they are working on this   Acute ischemic stroke Neurologist outpatient Continue aspirin  and Plavix  Continue statin  therapy Echo reviewed showing EF of 35 to 40% Cardiologist will follow-up with patient as an outpatient   Tobacco abuse She was counseled for smoking cessation and will receive further counseling here.   Dyslipidemia Continue statin therapy.   Type 2 diabetes mellitus with peripheral neuropathy (HCC) - The patient will be placed on supplemental coverage with NovoLog . - Will continue basal coverage. - Will hold off metformin  and Trulicity. - Will continue Neurontin .   Essential hypertension Continue antihypertensive therapy.     DVT prophylaxis: SCDs.   Advanced Care Planning:  Code Status: The patient is DNR only. Family Communication: No family at bedside   Disposition Plan: Back to previous home environment     Subjective:  Denies nausea vomiting abdominal pain chest pain TOC continues to work on rehab   Physical Exam: GENERAL: elderly female laying in bed in no acute distress LUNGS: Normal breath sounds bilaterally CARDIOVASCULAR: Regular rate and rhythm ABDOMEN: Soft, nondistended, nontender. Bowel sounds present. No organomegaly or mass.  EXTREMITIES: No pedal edema, cyanosis, or clubbing. Musculoskeletal: Left lateral hip tenderness. NEUROLOGIC: Cranial nerves II through XII are intact. Muscle strength 5/5 in all extremities. Sensation intact. Gait not checked.  PSYCHIATRIC: The patient is alert and oriented x 3.  Normal affect and good eye contact. SKIN: No obvious rash, lesion, or ulcer.     Data Reviewed:    Vitals:   11/22/23 1638 11/22/23 1931 11/23/23 0458 11/23/23 0722  BP: 117/60 126/71 112/63 113/62  Pulse: 81 83 79 77  Resp: 16 18 18 17   Temp: 99.3 F (37.4 C) 98.2  F (36.8 C) 98.8 F (37.1 C) 98.3 F (36.8 C)  TempSrc: Oral     SpO2: 96% 98% 99% 95%  Weight:      Height:          Latest Ref Rng & Units 11/22/2023    6:22 AM 11/21/2023    3:25 AM 11/20/2023    4:21 AM  CBC  WBC 4.0 - 10.5 K/uL 7.9  8.3  8.7   Hemoglobin 12.0 - 15.0  g/dL 89.6  88.4  89.0   Hematocrit 36.0 - 46.0 % 32.0  35.2  33.9   Platelets 150 - 400 K/uL 167  168  171        Latest Ref Rng & Units 11/22/2023    6:22 AM 11/21/2023    3:25 AM 11/20/2023    4:21 AM  BMP  Glucose 70 - 99 mg/dL 878  842  777   BUN 8 - 23 mg/dL 8  10  15    Creatinine 0.44 - 1.00 mg/dL 9.07  9.10  8.79   Sodium 135 - 145 mmol/L 136  136  138   Potassium 3.5 - 5.1 mmol/L 3.4  3.3  3.8   Chloride 98 - 111 mmol/L 102  101  107   CO2 22 - 32 mmol/L 27  25  25    Calcium  8.9 - 10.3 mg/dL 8.4  8.6  8.4      Author: Drue ONEIDA Potter, MD 11/23/2023 2:28 PM  For on call review [email or web address].

## 2023-11-24 DIAGNOSIS — S72002A Fracture of unspecified part of neck of left femur, initial encounter for closed fracture: Secondary | ICD-10-CM | POA: Diagnosis not present

## 2023-11-24 LAB — GLUCOSE, CAPILLARY
Glucose-Capillary: 149 mg/dL — ABNORMAL HIGH (ref 70–99)
Glucose-Capillary: 162 mg/dL — ABNORMAL HIGH (ref 70–99)
Glucose-Capillary: 234 mg/dL — ABNORMAL HIGH (ref 70–99)
Glucose-Capillary: 259 mg/dL — ABNORMAL HIGH (ref 70–99)
Glucose-Capillary: 294 mg/dL — ABNORMAL HIGH (ref 70–99)

## 2023-11-24 MED ORDER — DOCUSATE SODIUM 100 MG PO CAPS: 100.0000 mg | ORAL_CAPSULE | Freq: Two times a day (BID) | ORAL | Status: AC

## 2023-11-24 MED ORDER — AMLODIPINE BESYLATE 10 MG PO TABS
10.0000 mg | ORAL_TABLET | Freq: Every day | ORAL | Status: AC
Start: 2023-11-24 — End: ?

## 2023-11-24 MED ORDER — ATORVASTATIN CALCIUM 40 MG PO TABS: 40.0000 mg | ORAL_TABLET | Freq: Every day | ORAL | Status: AC

## 2023-11-24 MED ORDER — CLOPIDOGREL BISULFATE 75 MG PO TABS
75.0000 mg | ORAL_TABLET | Freq: Every day | ORAL | Status: AC
Start: 2023-11-24 — End: ?

## 2023-11-24 NOTE — Progress Notes (Signed)
 Physical Therapy Treatment Patient Details Name: [name] MRN: [id number] DOB: [birth year] Today's Date: 11/24/2023   History of Present Illness Pt is a 73 y.o. female presenting to hospital 11/18/23 with c/o fall (pt with acute onset of vertigo with subsequent fall while washing disches); c/o L side weakness.  Imaging showing acute nondisplaced intertrochanteric fx of proximal L femur; chronic appearing partial tearing of R hamstring tendon.  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  S/p L hip intramedullary nail d/t L hip intertrochanteric fx 11/19/23.  PMH includes DM, htn, HLD, intracranial aneurysm.    PT Comments  Pt finishing with OT session upon PT arrival; pt agreeable to therapy.  Pt reporting 7/10 L hip pain during session (pain increased with activity); nurse notified of pt's pain; pt pre-medicated with pain medication.  During session pt was mod assist semi-supine to sitting EOB; max assist to stand (from regular bed height) up to L platform RW; and mod assist to take a couple steps and then pivot on R LE towards recliner with L platform RW.  Pt appearing to demonstrate improved sitting and standing balance and improved ability to take steps (with less assist) with use of L platform RW.  Will continue to focus on strengthening, balance, and progressive functional mobility during hospitalization.    If plan is discharge home, recommend the following: Two people to help with walking and/or transfers;A lot of help with bathing/dressing/bathroom;Assistance with cooking/housework;Assist for transportation;Help with stairs or ramp for entrance   Can travel by private vehicle      No  Equipment Recommendations  Other (comment) (TBD at next facility)    Recommendations for Other Services       Precautions / Restrictions Precautions Precautions: Fall Recall of  Precautions/Restrictions: Intact Restrictions Weight Bearing Restrictions Per Provider Order: Yes LLE Weight Bearing Per Provider Order: Weight bearing as tolerated     Mobility  Bed Mobility Overal bed mobility: Needs Assistance Bed Mobility: Supine to Sit     Supine to sit: Mod assist     General bed mobility comments: assist for L LE, scooting hips to edge of bed, and for trunk; vc's for use of bed rail and overall technique    Transfers Overall transfer level: Needs assistance Equipment used: Left platform walker Transfers: Sit to/from Stand Sit to Stand: Max assist           General transfer comment: vc's for UE/LE positioning and overall technique; assist to come to full stand (vc's for upright posture) and assist to control descent sitting    Ambulation/Gait Ambulation/Gait assistance: Mod assist Gait Distance (Feet): 2 Feet Assistive device: Left platform walker   Gait velocity: decreased     General Gait Details: antalgic; decreased stance time L LE; increased effort/time to take steps; pt taking small steps initially but then pivoting on R LE to turn towards recliner; vc's for technique   Stairs             Wheelchair Mobility     Tilt Bed    Modified Rankin (Stroke Patients Only)       Balance Overall balance assessment: Needs assistance Sitting-balance support: No upper extremity supported, Feet supported Sitting balance-Leahy Scale: Good Sitting balance - Comments: pt holding onto menu with R hand and phone with L hand while ordering meals from dining service; SBA for safety; pt with midline posture  Standing balance support: Bilateral upper extremity supported, Reliant on assistive device for balance Standing balance-Leahy Scale: Fair Standing balance comment: CGA using L platform RW; vc's for upright posture                            Communication Communication Communication: No apparent difficulties  Cognition  Arousal: Alert Behavior During Therapy: WFL for tasks assessed/performed   PT - Cognitive impairments: No apparent impairments                         Following commands: Impaired Following commands impaired: Follows one step commands with increased time    Cueing Cueing Techniques: Verbal cues, Visual cues  Exercises      General Comments  Nursing cleared pt for participation in physical therapy.  Pt agreeable to PT session.      Pertinent Vitals/Pain Pain Assessment Pain Assessment: 0-10 Pain Score: 7  Pain Location: L hip pain Pain Descriptors / Indicators: Aching, Shooting Pain Intervention(s): Limited activity within patient's tolerance, Monitored during session, Premedicated before session, Repositioned, Patient requesting pain meds-RN notified HR in 70's to 90's bpm during sessions activities.    Home Living                          Prior Function            PT Goals (current goals can now be found in the care plan section) Acute Rehab PT Goals Patient Stated Goal: to improve strength and walking PT Goal Formulation: With patient Time For Goal Achievement: 12/04/23 Potential to Achieve Goals: Good Progress towards PT goals: Progressing toward goals    Frequency    7X/week      PT Plan      Co-evaluation              AM-PAC PT 6 Clicks Mobility   Outcome Measure  Help needed turning from your back to your side while in a flat bed without using bedrails?: A Lot Help needed moving from lying on your back to sitting on the side of a flat bed without using bedrails?: A Lot Help needed moving to and from a bed to a chair (including a wheelchair)?: A Lot Help needed standing up from a chair using your arms (e.g., wheelchair or bedside chair)?: A Lot Help needed to walk in hospital room?: Total Help needed climbing 3-5 steps with a railing? : Total 6 Click Score: 10    End of Session Equipment Utilized During Treatment: Gait  belt Activity Tolerance: Patient limited by pain Patient left: in chair;with call bell/phone within reach;with chair alarm set;Other (comment) (B heels floating via pillow support) Nurse Communication: Mobility status;Precautions;Weight bearing status;Patient requests pain meds PT Visit Diagnosis: Other abnormalities of gait and mobility (R26.89);Muscle weakness (generalized) (M62.81);History of falling (Z91.81);Pain Pain - Right/Left: Left Pain - part of body: Hip     Time: 8943-8865 PT Time Calculation (min) (ACUTE ONLY): 38 min  Charges:    $Therapeutic Activity: 38-52 mins PT General Charges $$ ACUTE PT VISIT: 1 Visit                    Damien Caulk, PT 11/24/23, 11:51 AM

## 2023-11-24 NOTE — Progress Notes (Signed)
 Inpatient Rehab Admissions Coordinator:   Spoke to pt's brother, Dwayne, on the phone for rehab recommendations.  Pt does live with him and other family members (not spouse).  He states that people are there often but not 24 hrs/day.  He's not sure about [facility] location, but is going to speak with pt today and family to see what they prefer.  I reviewed differences between CIR and SNF and goals of supervision at least initially after d/c from CIR.  I will f/u with him tomorrow after 10 AM.    Reche Lowers, PT, DPT Admissions Coordinator [phone] 11/24/23  1:17 PM

## 2023-11-24 NOTE — Inpatient Diabetes Management (Signed)
 Inpatient Diabetes Program Recommendations  AACE/ADA: New Consensus Statement on Inpatient Glycemic Control (2015)  Target Ranges:  Prepandial:   less than 140 mg/dL      Peak postprandial:   less than 180 mg/dL (1-2 hours)      Critically ill patients:  140 - 180 mg/dL    Latest Reference Range & Units 11/23/23 08:04 11/23/23 11:42 11/23/23 17:01 11/23/23 19:52  Glucose-Capillary 70 - 99 mg/dL 868 (H)  1 unit Novolog   15 units Lantus   285 (H)  5 units Novolog   318 (H)  7 units Novolog   367 (H)  9 units Novolog   (H): Data is abnormally high   Home DM Meds: Tresiba  50 units QPM Metformin  XR 500 mg TID Trulicity 0.75 mg Qweek (Friday)    Current Orders: Lantus  15 units daily Novolog  Sensitive Correction Scale/ SSI (0-9 units) Q4 hours        MD- Looks like pt only eating bites at meals CBGs in the afternoon quite elevated Please consider increasing the strength of the Novolog  SSI to the Moderate scale (0-15 units) Q4 hours     --Will follow patient during hospitalization--  Adina Rudolpho Arrow RN, MSN, CDCES Diabetes Coordinator Inpatient Glycemic Control Team Team Pager: [phone] (8a-5p)

## 2023-11-24 NOTE — Progress Notes (Signed)
 Progress Note   Patient: [name] FMW:[id number] DOB: Jan 30, 1951 DOA: 11/18/2023     5 DOS: the patient was seen and examined on 11/24/2023   Brief hospital course: From HPI [place] Bell Carbo is a 73 y.o. female with medical history significant for type diabetes mellitus, essential hypertension and dyslipidemia and intracranial aneurysm for which she declined intervention in 2023 at [facility], who presented to the emergency room with acute onset of vertigo with subsequent fall while washing her dishes and manage left hip pain.  She denies any presyncope or syncope.  She denies any bleeding diathesis.  No chest pain or palpitations.  No paresthesias or focal muscle weakness.  No witnessed seizures.  No nausea or vomiting or abdominal pain.  No cough or wheezing or dyspnea.  No chest pain or palpitations.   ED Course: When she came to the ER, BP was 157/73 with temperature 97.1 with otherwise normal vital signs.  Labs revealed a blood glucose of 370 with otherwise albumin of 3.3 and creatinine 1.15.  High-sensitivity troponin was 19 and later 18 and CBC was normal.  Blood group was A+ with negative antibody screen.  PT and INR were normal. EKG as reviewed by me : Normal sinus rhythm with rate of 81. Imaging: Noncontrasted CT scan showed marked severe multilevel degenerative changes without evidence of an acute fracture or subluxation. Chest x-ray showed no acute cardiopulmonary disease.  Hip and pelvic x-ray revealed no acute osseous abnormality. Left hip CT scan showed acute nondisplaced intertrochanteric fracture of the proximal left femur.     Assessment and Plan: Closed left hip fracture, initial encounter [facility]) - The patient will be admitted to a telemetry bed. S/p repair Orthopedics on board Continue PT OT with recommendation for inpatient rehab I discussed with TOC and they are working on this   Acute ischemic stroke Neurologist outpatient Continue aspirin  and Plavix  Continue statin  therapy Echo reviewed showing EF of 35 to 40% Cardiologist will follow-up with patient as an outpatient   Tobacco abuse She was counseled for smoking cessation and will receive further counseling here.   Dyslipidemia Continue statin therapy.   Type 2 diabetes mellitus with peripheral neuropathy (HCC) - The patient will be placed on supplemental coverage with NovoLog . - Will continue basal coverage. - Will hold off metformin  and Trulicity. - Will continue Neurontin .   Essential hypertension Continue antihypertensive therapy.     DVT prophylaxis: SCDs.   Advanced Care Planning:  Code Status: The patient is DNR only. Family Communication: No family at bedside   Disposition Plan: Back to previous home environment     Subjective:  Denies nausea vomiting abdominal pain chest pain Continues to await rehab placement   Physical Exam: GENERAL: elderly female laying in bed in no acute distress LUNGS: Normal breath sounds bilaterally CARDIOVASCULAR: Regular rate and rhythm ABDOMEN: Soft, nondistended, nontender. Bowel sounds present. No organomegaly or mass.  EXTREMITIES: No pedal edema, cyanosis, or clubbing. Musculoskeletal: Left lateral hip tenderness. NEUROLOGIC: Cranial nerves II through XII are intact. Muscle strength 5/5 in all extremities. Sensation intact. Gait not checked.  PSYCHIATRIC: The patient is alert and oriented x 3.  Normal affect and good eye contact. SKIN: No obvious rash, lesion, or ulcer.     Data Reviewed:      Latest Ref Rng & Units 11/22/2023    6:22 AM 11/21/2023    3:25 AM 11/20/2023    4:21 AM  BMP  Glucose 70 - 99 mg/dL 878  842  222   BUN 8 - 23 mg/dL 8  10  15    Creatinine 0.44 - 1.00 mg/dL 9.07  9.10  8.79   Sodium 135 - 145 mmol/L 136  136  138   Potassium 3.5 - 5.1 mmol/L 3.4  3.3  3.8   Chloride 98 - 111 mmol/L 102  101  107   CO2 22 - 32 mmol/L 27  25  25    Calcium  8.9 - 10.3 mg/dL 8.4  8.6  8.4        Latest Ref Rng & Units  11/22/2023    6:22 AM 11/21/2023    3:25 AM 11/20/2023    4:21 AM  CBC  WBC 4.0 - 10.5 K/uL 7.9  8.3  8.7   Hemoglobin 12.0 - 15.0 g/dL 89.6  88.4  89.0   Hematocrit 36.0 - 46.0 % 32.0  35.2  33.9   Platelets 150 - 400 K/uL 167  168  171     Vitals:   11/23/23 2013 11/24/23 0340 11/24/23 0806 11/24/23 1527  BP: (!) 115/59 131/72 120/66 123/66  Pulse: 79 77 79 77  Resp: 18 17 16 16   Temp: 98.9 F (37.2 C) 98.2 F (36.8 C) 99.3 F (37.4 C) 98.1 F (36.7 C)  TempSrc: Oral Oral    SpO2: 96% 100% 96% 99%  Weight:      Height:        Author: Drue ONEIDA Potter, MD 11/24/2023 5:32 PM  For on call review [email or web address].

## 2023-11-24 NOTE — Progress Notes (Signed)
 Subjective: 5 Days Post-Op Procedure(s) (LRB): FIXATION, FRACTURE, INTERTROCHANTERIC, WITH INTRAMEDULLARY ROD (Left) Patient reports pain as mild in the left left current.  Moderate pain with PT. Patient with history of neuropathy, gabapentin  on board. Patient is well, and has had no acute complaints or problems We will continue with therapy today.  Current plan is for d/c to CIR. Patient has had a BM.  Objective: Vital signs in last 24 hours: Temp:  [98.2 F (36.8 C)-98.9 F (37.2 C)] 98.2 F (36.8 C) (09/01 0340) Pulse Rate:  [71-79] 77 (09/01 0340) Resp:  [17-18] 17 (09/01 0340) BP: (115-131)/(59-72) 131/72 (09/01 0340) SpO2:  [96 %-100 %] 100 % (09/01 0340)  Intake/Output from previous day: 08/31 0701 - 09/01 0700 In: 2150.6 [P.O.:480; I.V.:1670.6] Out: 1100 [Urine:1100] Intake/Output this shift: No intake/output data recorded.  Recent Labs    11/22/23 0622  HGB 10.3*   Recent Labs    11/22/23 0622  WBC 7.9  RBC 3.61*  HCT 32.0*  PLT 167   Recent Labs    11/22/23 0622  NA 136  K 3.4*  CL 102  CO2 27  BUN 8  CREATININE 0.92  GLUCOSE 121*  CALCIUM  8.4*   No results for input(s): LABPT, INR in the last 72 hours.   EXAM General - Patient is Alert, Appropriate, and Oriented Extremity - Neurovascular intact Dorsiflexion/Plantar flexion intact No cellulitis present Compartment soft Dressing - dressing C/D/I Motor Function - intact, moving foot and toes well on exam.   Past Medical History:  Diagnosis Date   Diabetes mellitus without complication (HCC)     Assessment/Plan:   5 Days Post-Op Procedure(s) (LRB): FIXATION, FRACTURE, INTERTROCHANTERIC, WITH INTRAMEDULLARY ROD (Left) Principal Problem:   Closed left hip fracture, initial encounter (HCC) Active Problems:   Essential hypertension   Type 2 diabetes mellitus with peripheral neuropathy (HCC)   Tobacco abuse   Dyslipidemia  Estimated body mass index is 26.43 kg/m as calculated  from the following:   Height as of this encounter: 5' 4 (1.626 m).   Weight as of this encounter: 69.9 kg. Advance diet Up with therapy  Pain controlled.  Robaxin  added to pain regimen. Gabapentin  for neuropathy. Vitals and labs reviewed, No recent fever. CM to assist with discharge, current plan is for discharge to CIR.  Patient will need 2 week follow up with [facility]. Lovenox  40mg  SQ daily x 14 days at discharge  DVT Prophylaxis - Lovenox , TED hose, and SCDs Weight-Bearing as tolerated to left leg  J. Gustavo Level, PA-C [facility] 11/24/2023, 7:53 AM

## 2023-11-24 NOTE — Progress Notes (Signed)
 Occupational Therapy Treatment Patient Details Name: [name] MRN: [id number] DOB: [birth year] Today's Date: 11/24/2023   History of present illness Pt is a 73 y.o. female presenting to hospital 11/18/23 with c/o fall (pt with acute onset of vertigo with subsequent fall while washing disches); c/o L side weakness.  Imaging showing acute nondisplaced intertrochanteric fx of proximal L femur; chronic appearing partial tearing of R hamstring tendon.  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  S/p L hip intramedullary nail d/t L hip intertrochanteric fx 11/19/23.  PMH includes DM, htn, HLD, intracranial aneurysm.   OT comments  Patient was premedicated for pain prior to session.  Patient reports neuropathy pain bothering her more than her hip.  But during session very guarding and tight with moving left hip.  Patient min assist with head of bed up supine to sit.  Patient able to sit on edge of bed unsupported feet on floor participating in lower body ADLs.  Initiated adaptive equipment use for lower body dressing.  Patient needed mod to max assistance.  With verbal cueing.  Patient following directions one-step commands and orientated x 3.  Patient needed mod assist sit to supine.  Left with PT that came in upon finishing up OT session.  Patient can continue to benefit from skilled OT services to increase sitting and standing balance as well as functional mobility and ADLs as well as retraining independence in ADLs using adaptive equipment.      If plan is discharge home, recommend the following:  Two people to help with walking and/or transfers;A lot of help with bathing/dressing/bathroom;Assistance with cooking/housework;Assist for transportation;Help with stairs or ramp for entrance   Equipment Recommendations       Recommendations for Other Services Rehab consult     Precautions / Restrictions Precautions Precautions: Fall Recall of Precautions/Restrictions: Intact Restrictions Weight Bearing Restrictions Per Provider Order: Yes LLE Weight Bearing Per Provider Order: Weight bearing as tolerated       Mobility Bed Mobility                    Transfers                         Balance                                           ADL either performed or assessed with clinical judgement   ADL                                              Extremity/Trunk Assessment Upper Extremity Assessment LUE Deficits / Details: Weakness in left upper extremity-with sock aid dropped out of her hand hand.  Patient able to use reacher with right hand.            Vision       Perception     Praxis     Communication Communication Communication: No apparent difficulties   Cognition Arousal: Alert Behavior During Therapy: [facility] for tasks assessed/performed  Following commands impaired: Only follows one step commands consistently (Distracted easily)      Cueing   Cueing Techniques: Verbal cues, Visual cues  Exercises Other Exercises Other Exercises: With head of bed patient min assist but needs time for supine to sit.  With mod verbal cueing Other Exercises: Sitting edge of bed with feet on floor able to participate with upper extremity and ADLs. Other Exercises: Using adaptive equipment reacher to doff socks with min assist Other Exercises: Using sock aid mod to max assist for donning socks Other Exercises: Donning paints using reacher mod to max assist    Shoulder Instructions       General Comments      Pertinent Vitals/ Pain       Pain Assessment Pain Score: 7  Pain Location: L hip pain Pain Descriptors / Indicators: Aching, Shooting  Home Living                                          Prior  Functioning/Environment              Frequency  Min 3X/week        Progress Toward Goals  OT Goals(current goals can now be found in the care plan section)  Progress towards OT goals: Progressing toward goals  Acute Rehab OT Goals Patient Stated Goal: Getting better OT Goal Formulation: With patient Time For Goal Achievement: 12/04/23 Potential to Achieve Goals: Fair  Plan      Co-evaluation                 AM-PAC OT 6 Clicks Daily Activity     Outcome Measure                    End of Session    OT Visit Diagnosis: Other abnormalities of gait and mobility (R26.89);Unsteadiness on feet (R26.81);Muscle weakness (generalized) (M62.81)   Activity Tolerance Patient tolerated treatment well   Patient Left in bed;with call bell/phone within reach;with bed alarm set   Nurse Communication          Time: 8974-8944 OT Time Calculation (min): 30 min  Charges: OT General Charges $OT Visit: 1 Visit OT Treatments $Self Care/Home Management : 23-37 mins   Oluwasemilore Bahl OTR/L,CLT 11/24/2023, 1:07 PM

## 2023-11-24 NOTE — Progress Notes (Signed)
 Inpatient Rehab Admissions Coordinator:   I did receive insurance approval for CIR, but I do not have a bed for this pt to admit today.  I also need to confirm caregiver support with spouse but was unable to reach him Friday.  Will try to f/u with him today.    Reche Lowers, PT, DPT Admissions Coordinator [phone] 11/24/23  9:48 AM

## 2023-11-24 NOTE — Plan of Care (Signed)

## 2023-11-25 ENCOUNTER — Inpatient Hospital Stay (HOSPITAL_COMMUNITY)

## 2023-11-25 ENCOUNTER — Inpatient Hospital Stay (HOSPITAL_COMMUNITY)
Admission: AD | Admit: 2023-11-25 | Discharge: 2023-12-10 | DRG: 057 | Disposition: A | Discharge status: Skilled Nursing Facility | Source: Other Acute Inpatient Hospital | Attending: Physical Medicine & Rehabilitation | Admitting: Physical Medicine & Rehabilitation

## 2023-11-25 ENCOUNTER — Encounter (HOSPITAL_COMMUNITY): Payer: Self-pay | Admitting: Physical Medicine & Rehabilitation

## 2023-11-25 ENCOUNTER — Other Ambulatory Visit: Payer: Self-pay

## 2023-11-25 DIAGNOSIS — Z79899 Other long term (current) drug therapy: Secondary | ICD-10-CM

## 2023-11-25 DIAGNOSIS — F09 Unspecified mental disorder due to known physiological condition: Secondary | ICD-10-CM | POA: Diagnosis not present

## 2023-11-25 DIAGNOSIS — M19012 Primary osteoarthritis, left shoulder: Secondary | ICD-10-CM | POA: Diagnosis present

## 2023-11-25 DIAGNOSIS — I6340 Cerebral infarction due to embolism of unspecified cerebral artery: Secondary | ICD-10-CM

## 2023-11-25 DIAGNOSIS — B9689 Other specified bacterial agents as the cause of diseases classified elsewhere: Secondary | ICD-10-CM | POA: Diagnosis present

## 2023-11-25 DIAGNOSIS — M7989 Other specified soft tissue disorders: Secondary | ICD-10-CM | POA: Diagnosis not present

## 2023-11-25 DIAGNOSIS — N390 Urinary tract infection, site not specified: Secondary | ICD-10-CM | POA: Diagnosis not present

## 2023-11-25 DIAGNOSIS — S72142K Displaced intertrochanteric fracture of left femur, subsequent encounter for closed fracture with nonunion: Principal | ICD-10-CM

## 2023-11-25 DIAGNOSIS — I69392 Facial weakness following cerebral infarction: Secondary | ICD-10-CM | POA: Diagnosis not present

## 2023-11-25 DIAGNOSIS — S72145A Nondisplaced intertrochanteric fracture of left femur, initial encounter for closed fracture: Secondary | ICD-10-CM

## 2023-11-25 DIAGNOSIS — M24562 Contracture, left knee: Secondary | ICD-10-CM | POA: Diagnosis present

## 2023-11-25 DIAGNOSIS — Z7985 Long-term (current) use of injectable non-insulin antidiabetic drugs: Secondary | ICD-10-CM

## 2023-11-25 DIAGNOSIS — N179 Acute kidney failure, unspecified: Secondary | ICD-10-CM | POA: Diagnosis present

## 2023-11-25 DIAGNOSIS — S72145D Nondisplaced intertrochanteric fracture of left femur, subsequent encounter for closed fracture with routine healing: Secondary | ICD-10-CM

## 2023-11-25 DIAGNOSIS — M21162 Varus deformity, not elsewhere classified, left knee: Secondary | ICD-10-CM | POA: Diagnosis present

## 2023-11-25 DIAGNOSIS — E785 Hyperlipidemia, unspecified: Secondary | ICD-10-CM | POA: Diagnosis present

## 2023-11-25 DIAGNOSIS — Z638 Other specified problems related to primary support group: Secondary | ICD-10-CM | POA: Diagnosis not present

## 2023-11-25 DIAGNOSIS — D631 Anemia in chronic kidney disease: Secondary | ICD-10-CM | POA: Diagnosis present

## 2023-11-25 DIAGNOSIS — S72002A Fracture of unspecified part of neck of left femur, initial encounter for closed fracture: Secondary | ICD-10-CM | POA: Diagnosis not present

## 2023-11-25 DIAGNOSIS — W19XXXD Unspecified fall, subsequent encounter: Secondary | ICD-10-CM | POA: Diagnosis present

## 2023-11-25 DIAGNOSIS — E1122 Type 2 diabetes mellitus with diabetic chronic kidney disease: Secondary | ICD-10-CM | POA: Diagnosis present

## 2023-11-25 DIAGNOSIS — M1712 Unilateral primary osteoarthritis, left knee: Secondary | ICD-10-CM | POA: Diagnosis present

## 2023-11-25 DIAGNOSIS — X58XXXA Exposure to other specified factors, initial encounter: Secondary | ICD-10-CM | POA: Diagnosis present

## 2023-11-25 DIAGNOSIS — A499 Bacterial infection, unspecified: Secondary | ICD-10-CM | POA: Diagnosis not present

## 2023-11-25 DIAGNOSIS — I6349 Cerebral infarction due to embolism of other cerebral artery: Secondary | ICD-10-CM | POA: Diagnosis not present

## 2023-11-25 DIAGNOSIS — I69354 Hemiplegia and hemiparesis following cerebral infarction affecting left non-dominant side: Principal | ICD-10-CM

## 2023-11-25 DIAGNOSIS — K5900 Constipation, unspecified: Secondary | ICD-10-CM | POA: Diagnosis present

## 2023-11-25 DIAGNOSIS — S72142A Displaced intertrochanteric fracture of left femur, initial encounter for closed fracture: Secondary | ICD-10-CM | POA: Diagnosis present

## 2023-11-25 DIAGNOSIS — B370 Candidal stomatitis: Secondary | ICD-10-CM | POA: Diagnosis present

## 2023-11-25 DIAGNOSIS — M25512 Pain in left shoulder: Secondary | ICD-10-CM | POA: Diagnosis not present

## 2023-11-25 DIAGNOSIS — F17210 Nicotine dependence, cigarettes, uncomplicated: Secondary | ICD-10-CM | POA: Diagnosis present

## 2023-11-25 DIAGNOSIS — N1832 Chronic kidney disease, stage 3b: Secondary | ICD-10-CM | POA: Diagnosis present

## 2023-11-25 DIAGNOSIS — G8929 Other chronic pain: Secondary | ICD-10-CM | POA: Diagnosis present

## 2023-11-25 DIAGNOSIS — R252 Cramp and spasm: Secondary | ICD-10-CM | POA: Diagnosis present

## 2023-11-25 DIAGNOSIS — R32 Unspecified urinary incontinence: Secondary | ICD-10-CM | POA: Diagnosis present

## 2023-11-25 DIAGNOSIS — I69398 Other sequelae of cerebral infarction: Secondary | ICD-10-CM | POA: Diagnosis not present

## 2023-11-25 DIAGNOSIS — L8990 Pressure ulcer of unspecified site, unspecified stage: Secondary | ICD-10-CM | POA: Diagnosis present

## 2023-11-25 DIAGNOSIS — R339 Retention of urine, unspecified: Secondary | ICD-10-CM | POA: Diagnosis present

## 2023-11-25 DIAGNOSIS — Z7982 Long term (current) use of aspirin: Secondary | ICD-10-CM

## 2023-11-25 DIAGNOSIS — R42 Dizziness and giddiness: Secondary | ICD-10-CM | POA: Diagnosis present

## 2023-11-25 DIAGNOSIS — Z7401 Bed confinement status: Secondary | ICD-10-CM | POA: Diagnosis not present

## 2023-11-25 DIAGNOSIS — I639 Cerebral infarction, unspecified: Secondary | ICD-10-CM | POA: Diagnosis present

## 2023-11-25 DIAGNOSIS — Z7984 Long term (current) use of oral hypoglycemic drugs: Secondary | ICD-10-CM

## 2023-11-25 DIAGNOSIS — S0990XA Unspecified injury of head, initial encounter: Secondary | ICD-10-CM | POA: Diagnosis present

## 2023-11-25 DIAGNOSIS — I1310 Hypertensive heart and chronic kidney disease without heart failure, with stage 1 through stage 4 chronic kidney disease, or unspecified chronic kidney disease: Secondary | ICD-10-CM | POA: Diagnosis present

## 2023-11-25 DIAGNOSIS — Z794 Long term (current) use of insulin: Secondary | ICD-10-CM | POA: Diagnosis not present

## 2023-11-25 DIAGNOSIS — I63411 Cerebral infarction due to embolism of right middle cerebral artery: Secondary | ICD-10-CM | POA: Diagnosis not present

## 2023-11-25 DIAGNOSIS — S3091XA Unspecified superficial injury of lower back and pelvis, initial encounter: Secondary | ICD-10-CM | POA: Diagnosis present

## 2023-11-25 DIAGNOSIS — Z7902 Long term (current) use of antithrombotics/antiplatelets: Secondary | ICD-10-CM

## 2023-11-25 DIAGNOSIS — E1165 Type 2 diabetes mellitus with hyperglycemia: Secondary | ICD-10-CM | POA: Diagnosis not present

## 2023-11-25 DIAGNOSIS — I671 Cerebral aneurysm, nonruptured: Secondary | ICD-10-CM | POA: Diagnosis present

## 2023-11-25 DIAGNOSIS — I63429 Cerebral infarction due to embolism of unspecified anterior cerebral artery: Secondary | ICD-10-CM | POA: Diagnosis not present

## 2023-11-25 DIAGNOSIS — Z885 Allergy status to narcotic agent status: Secondary | ICD-10-CM

## 2023-11-25 DIAGNOSIS — Z888 Allergy status to other drugs, medicaments and biological substances status: Secondary | ICD-10-CM

## 2023-11-25 DIAGNOSIS — W19XXXA Unspecified fall, initial encounter: Secondary | ICD-10-CM | POA: Diagnosis present

## 2023-11-25 LAB — GLUCOSE, CAPILLARY
Glucose-Capillary: 134 mg/dL — ABNORMAL HIGH (ref 70–99)
Glucose-Capillary: 175 mg/dL — ABNORMAL HIGH (ref 70–99)
Glucose-Capillary: 218 mg/dL — ABNORMAL HIGH (ref 70–99)
Glucose-Capillary: 235 mg/dL — ABNORMAL HIGH (ref 70–99)
Glucose-Capillary: 298 mg/dL — ABNORMAL HIGH (ref 70–99)
Glucose-Capillary: 330 mg/dL — ABNORMAL HIGH (ref 70–99)

## 2023-11-25 MED ORDER — NICOTINE 21 MG/24HR TD PT24
21.0000 mg | MEDICATED_PATCH | Freq: Every day | TRANSDERMAL | Status: DC
  Administered 2023-11-26 – 2023-12-10 (×15): 21 mg via TRANSDERMAL
  Filled 2023-11-25 (×15): qty 1

## 2023-11-25 MED ORDER — ONDANSETRON HCL 4 MG PO TABS: 4.0000 mg | ORAL_TABLET | Freq: Four times a day (QID) | ORAL | Status: DC | PRN

## 2023-11-25 MED ORDER — ATORVASTATIN CALCIUM 40 MG PO TABS
40.0000 mg | ORAL_TABLET | Freq: Every day | ORAL | Status: DC
Start: 2023-11-26 — End: 2023-12-10
  Administered 2023-11-26 – 2023-12-10 (×15): 40 mg via ORAL
  Filled 2023-11-25 (×15): qty 1

## 2023-11-25 MED ORDER — BUPROPION HCL ER (SR) 150 MG PO TB12
150.0000 mg | ORAL_TABLET | Freq: Two times a day (BID) | ORAL | Status: DC
  Administered 2023-11-25 – 2023-12-10 (×30): 150 mg via ORAL
  Filled 2023-11-25 (×31): qty 1

## 2023-11-25 MED ORDER — TRAMADOL HCL 50 MG PO TABS: 50.0000 mg | ORAL_TABLET | Freq: Four times a day (QID) | ORAL | Status: DC | PRN

## 2023-11-25 MED ORDER — DICLOFENAC SODIUM 1 % EX GEL
4.0000 g | Freq: Four times a day (QID) | CUTANEOUS | Status: DC
  Administered 2023-11-25 – 2023-12-09 (×32): 4 g via TOPICAL
  Filled 2023-11-25: qty 100

## 2023-11-25 MED ORDER — AMLODIPINE BESYLATE 10 MG PO TABS
10.0000 mg | ORAL_TABLET | Freq: Every day | ORAL | Status: DC
Start: 2023-11-26 — End: 2023-12-10
  Administered 2023-11-26 – 2023-12-10 (×15): 10 mg via ORAL
  Filled 2023-11-25 (×15): qty 1

## 2023-11-25 MED ORDER — INSULIN GLARGINE 100 UNIT/ML ~~LOC~~ SOLN
15.0000 [IU] | SUBCUTANEOUS | Status: DC
  Administered 2023-11-26 – 2023-11-27 (×2): 15 [IU] via SUBCUTANEOUS
  Filled 2023-11-25 (×3): qty 0.15

## 2023-11-25 MED ORDER — GABAPENTIN 400 MG PO CAPS
800.0000 mg | ORAL_CAPSULE | Freq: Three times a day (TID) | ORAL | Status: DC
  Administered 2023-11-25 – 2023-12-10 (×45): 800 mg via ORAL
  Filled 2023-11-25 (×45): qty 2

## 2023-11-25 MED ORDER — LIVING WELL WITH DIABETES BOOK
Freq: Once | Status: AC
  Filled 2023-11-25: qty 1

## 2023-11-25 MED ORDER — ASPIRIN 81 MG PO TBEC
81.0000 mg | DELAYED_RELEASE_TABLET | Freq: Every day | ORAL | Status: DC
Start: 2023-11-26 — End: 2023-12-10
  Administered 2023-11-26 – 2023-12-10 (×15): 81 mg via ORAL
  Filled 2023-11-25 (×15): qty 1

## 2023-11-25 MED ORDER — SORBITOL 70 % SOLN
30.0000 mL | Freq: Once | Status: AC
  Administered 2023-11-25: 30 mL via ORAL
  Filled 2023-11-25: qty 30

## 2023-11-25 MED ORDER — ENOXAPARIN SODIUM 40 MG/0.4ML IJ SOSY
40.0000 mg | PREFILLED_SYRINGE | INTRAMUSCULAR | Status: DC
Start: 2023-11-25 — End: 2023-11-25

## 2023-11-25 MED ORDER — DOCUSATE SODIUM 100 MG PO CAPS
100.0000 mg | ORAL_CAPSULE | Freq: Two times a day (BID) | ORAL | Status: DC
  Administered 2023-11-25 – 2023-12-02 (×14): 100 mg via ORAL
  Filled 2023-11-25 (×14): qty 1

## 2023-11-25 MED ORDER — TAMSULOSIN HCL 0.4 MG PO CAPS
0.4000 mg | ORAL_CAPSULE | Freq: Every day | ORAL | Status: DC
  Administered 2023-11-25 – 2023-12-07 (×13): 0.4 mg via ORAL
  Filled 2023-11-25 (×13): qty 1

## 2023-11-25 MED ORDER — ENOXAPARIN SODIUM 40 MG/0.4ML IJ SOSY
40.0000 mg | PREFILLED_SYRINGE | INTRAMUSCULAR | Status: DC
  Administered 2023-11-26 – 2023-12-10 (×15): 40 mg via SUBCUTANEOUS
  Filled 2023-11-25 (×15): qty 0.4

## 2023-11-25 MED ORDER — METHOCARBAMOL 500 MG PO TABS
500.0000 mg | ORAL_TABLET | Freq: Three times a day (TID) | ORAL | Status: DC | PRN
  Administered 2023-11-28 – 2023-11-29 (×3): 500 mg via ORAL
  Filled 2023-11-25 (×3): qty 1

## 2023-11-25 MED ORDER — LISINOPRIL 20 MG PO TABS
40.0000 mg | ORAL_TABLET | Freq: Every day | ORAL | Status: DC
  Administered 2023-11-26 – 2023-12-10 (×15): 40 mg via ORAL
  Filled 2023-11-25 (×15): qty 2

## 2023-11-25 MED ORDER — INSULIN ASPART 100 UNIT/ML IJ SOLN
0.0000 [IU] | INTRAMUSCULAR | Status: DC
  Administered 2023-11-25: 3 [IU] via SUBCUTANEOUS
  Administered 2023-11-25: 7 [IU] via SUBCUTANEOUS
  Administered 2023-11-26: 2 [IU] via SUBCUTANEOUS
  Administered 2023-11-26: 9 [IU] via SUBCUTANEOUS
  Administered 2023-11-26: 7 [IU] via SUBCUTANEOUS
  Administered 2023-11-26: 3 [IU] via SUBCUTANEOUS
  Administered 2023-11-26: 2 [IU] via SUBCUTANEOUS
  Administered 2023-11-26 – 2023-11-27 (×3): 1 [IU] via SUBCUTANEOUS
  Administered 2023-11-27: 7 [IU] via SUBCUTANEOUS
  Administered 2023-11-27: 5 [IU] via SUBCUTANEOUS

## 2023-11-25 MED ORDER — CLOPIDOGREL BISULFATE 75 MG PO TABS
75.0000 mg | ORAL_TABLET | Freq: Every day | ORAL | Status: DC
Start: 2023-11-26 — End: 2023-12-10
  Administered 2023-11-26 – 2023-12-10 (×15): 75 mg via ORAL
  Filled 2023-11-25 (×15): qty 1

## 2023-11-25 MED ORDER — LIDOCAINE HCL URETHRAL/MUCOSAL 2 % EX GEL
1.0000 | CUTANEOUS | Status: DC | PRN
  Administered 2023-11-25: 1 via URETHRAL
  Filled 2023-11-25: qty 6

## 2023-11-25 MED ORDER — TRAZODONE HCL 50 MG PO TABS
25.0000 mg | ORAL_TABLET | Freq: Every evening | ORAL | Status: DC | PRN
  Administered 2023-12-02 – 2023-12-03 (×2): 25 mg via ORAL
  Filled 2023-11-25 (×2): qty 1

## 2023-11-25 MED ORDER — OYSTER SHELL CALCIUM/D3 500-5 MG-MCG PO TABS
1.0000 | ORAL_TABLET | Freq: Every day | ORAL | Status: DC
  Administered 2023-11-26 – 2023-12-10 (×15): 1 via ORAL
  Filled 2023-11-25 (×15): qty 1

## 2023-11-25 MED ORDER — OXYCODONE HCL 5 MG PO TABS
5.0000 mg | ORAL_TABLET | ORAL | Status: DC | PRN
  Administered 2023-11-25 – 2023-12-09 (×12): 5 mg via ORAL
  Filled 2023-11-25 (×13): qty 1

## 2023-11-25 MED ORDER — NYSTATIN 100000 UNIT/ML MT SUSP
5.0000 mL | Freq: Four times a day (QID) | OROMUCOSAL | Status: AC
  Administered 2023-11-25 – 2023-12-01 (×16): 500000 [IU] via ORAL
  Filled 2023-11-25 (×23): qty 5

## 2023-11-25 MED ORDER — ACETAMINOPHEN 325 MG PO TABS: 325.0000 mg | ORAL_TABLET | Freq: Four times a day (QID) | ORAL | Status: DC | PRN

## 2023-11-25 MED ORDER — CARVEDILOL 25 MG PO TABS
25.0000 mg | ORAL_TABLET | Freq: Two times a day (BID) | ORAL | Status: DC
  Administered 2023-11-25 – 2023-12-10 (×30): 25 mg via ORAL
  Filled 2023-11-25 (×30): qty 1

## 2023-11-25 MED ORDER — LACTULOSE 10 GM/15ML PO SOLN: 30.0000 g | Freq: Two times a day (BID) | ORAL | Status: DC | PRN

## 2023-11-25 MED ORDER — ONDANSETRON HCL 4 MG/2ML IJ SOLN: 4.0000 mg | Freq: Four times a day (QID) | INTRAMUSCULAR | Status: DC | PRN

## 2023-11-25 MED ORDER — LIDOCAINE HCL (CARDIAC) PF 100 MG/5ML IV SOSY
PREFILLED_SYRINGE | INTRAVENOUS | Status: AC
Start: 2023-11-25 — End: 2023-11-26
  Filled 2023-11-25: qty 5

## 2023-11-25 MED ORDER — ADULT MULTIVITAMIN W/MINERALS CH
1.0000 | ORAL_TABLET | Freq: Every day | ORAL | Status: DC
  Administered 2023-11-26 – 2023-12-10 (×15): 1 via ORAL
  Filled 2023-11-25 (×15): qty 1

## 2023-11-25 MED ORDER — VITAMIN B-12 1000 MCG PO TABS
1000.0000 ug | ORAL_TABLET | Freq: Every day | ORAL | Status: DC
  Administered 2023-11-26 – 2023-12-10 (×15): 1000 ug via ORAL
  Filled 2023-11-25 (×15): qty 1

## 2023-11-25 NOTE — Progress Notes (Signed)
 Subjective: 6 Days Post-Op Procedure(s) (LRB): FIXATION, FRACTURE, INTERTROCHANTERIC, WITH INTRAMEDULLARY ROD (Left) Patient reports pain as mild   Patient is well, and has had no acute complaints or problems We will continue with therapy today.  Current plan is for d/c to CIR today Patient has had a BM.  Objective: Vital signs in last 24 hours: Temp:  [98.1 F (36.7 C)-99 F (37.2 C)] 99 F (37.2 C) (09/02 0735) Pulse Rate:  [75-81] 81 (09/02 0735) Resp:  [15-18] 15 (09/02 0735) BP: (118-148)/(65-73) 148/69 (09/02 0735) SpO2:  [94 %-99 %] 97 % (09/02 0735)  Intake/Output from previous day: 09/01 0701 - 09/02 0700 In: 2480 [P.O.:580; I.V.:1900] Out: 2250 [Urine:2250] Intake/Output this shift: Total I/O In: 240 [P.O.:240] Out: 1650 [Urine:1650]  No results for input(s): HGB in the last 72 hours.  No results for input(s): WBC, RBC, HCT, PLT in the last 72 hours.  No results for input(s): NA, K, CL, CO2, BUN, CREATININE, GLUCOSE, CALCIUM  in the last 72 hours.  No results for input(s): LABPT, INR in the last 72 hours.   EXAM General - Patient is Alert, Appropriate, and Oriented Extremity - Neurovascular intact Dorsiflexion/Plantar flexion intact No cellulitis present Compartment soft Dressing - dressing C/D/I Motor Function - intact, moving foot and toes well on exam.   Past Medical History:  Diagnosis Date   Diabetes mellitus without complication (HCC)     Assessment/Plan:   6 Days Post-Op Procedure(s) (LRB): FIXATION, FRACTURE, INTERTROCHANTERIC, WITH INTRAMEDULLARY ROD (Left) Principal Problem:   Closed left hip fracture, initial encounter (HCC) Active Problems:   Essential hypertension   Type 2 diabetes mellitus with peripheral neuropathy (HCC)   Tobacco abuse   Dyslipidemia  Estimated body mass index is 26.43 kg/m as calculated from the following:   Height as of this encounter: 5' 4 (1.626 m).   Weight as of this  encounter: 69.9 kg. Advance diet Up with therapy  Pain controlled.   Gabapentin  for neuropathy. Vitals and labs reviewed, No recent fever. CM to assist with discharge, current plan is for discharge to CIR, possibly today.  Patient will need 2 week follow up with [facility]. Lovenox  40mg  SQ daily x 14 days at discharge  DVT Prophylaxis - Lovenox , TED hose, and SCDs Weight-Bearing as tolerated to left leg  T Medford Amber, PA-C [facility] 11/25/2023, 11:46 AM

## 2023-11-25 NOTE — H&P (Signed)
 Physical Medicine and Rehabilitation Admission H&P    Chief Complaint  Patient presents with   Fall  : HPI: [name] is a 73 year old right-handed female with history significant for diabetes mellitus- A1c now 12.4 , CKD stage IIIb, hypertension, hyperlipidemia intracranial aneurysm for which she declined intervention 2023 at [facility], tobacco use.  Per chart review patient lives with brother and other family members.  1 level home with ramped entrance.  Reportedly modified independent at baseline for ADLs ambulating with a rolling walker household distances.  Presented to [facility] 11/19/2023 with acute onset of vertigo and subsequent fall while washing her dishes with reports of left hip pain.  She was unable to get up after the fall.  Admission chemistries unremarkable except glucose 370, creatinine 1.15, troponin 19-18, hemoglobin A1c 12.4, EKG normal sinus rhythm.  Cranial CT scan showed generalized cerebral atrophy with microvascular disease changes of the supratentorial brain.  Chronic left basal ganglia lacunar infarct.  No acute changes.  CT cervical spine negative.  CT of the left hip showed acute nondisplaced intertrochanteric fracture of the proximal left femur.  MRI showed acute infarct in the posterior limb of the right internal capsule superiorly.  Possible punctate acute infarct in the left middle cerebellar peduncle versus artifact.  Remote lacunar infarct in the pons, left basal ganglia and right corona radiata.  Patient did not receive TNK.  Echocardiogram with ejection fraction of 35 to 40%.  Grade 1 diastolic dysfunction.  The left ventricle demonstrating global hypokinesis.  Patient was cleared for surgical intervention and underwent left hip intramedullary nail 11/19/2023 per Dr. Arthea Sheer.  Weightbearing as tolerated left lower extremity.  Acute blood loss anemia 10.3 and monitored.  Neurology follow-up in regards to CVA maintained on aspirin  and Plavix  indefinitely.  Patient  maintained on Lovenox  for DVT prophylaxis.  Tolerating a regular consistency diet.  Therapy evaluations completed due to patient's decreased functional mobility was admitted for a comprehensive rehab program.   Pt reports she's constantly leaking urine even with using Purewick, but has no urge to pee- doesn't even feels when going - so I had her  bladder scanned when I was in room- was scanned for 484cc- as well as pt c/o constipation- LBM 4-5 days ago. Feels bloated.  Asked nursing to cath using lidocaine  jelly. Cathed for 550cc.  Pt reports her L hip is painful s/p Hip IM nail- but not bad enough to take meds as long as I stay still. Explained the definition of rehab is moving, so will need to ask for pain meds BEFORE therapy.   Also c/o dry thick tongue- food doesn't taste the same- just not right- and so dry, hard to talk.   Pt also admits to dry eyes, but water all the time-  has been for at least 2-4 weeks.      Review of Systems  Constitutional:  Negative for chills and fever.  HENT:  Negative for hearing loss.   Eyes:  Negative for blurred vision and double vision.  Respiratory:  Negative for cough, shortness of breath and wheezing.   Cardiovascular:  Negative for chest pain, palpitations and leg swelling.  Gastrointestinal:  Positive for constipation. Negative for heartburn, nausea and vomiting.  Genitourinary:  Negative for dysuria, flank pain and hematuria.  Musculoskeletal:  Positive for falls and myalgias.  Skin:  Negative for rash.  Neurological:  Positive for dizziness, focal weakness and weakness.       Vertigo  All other systems reviewed  and are negative.  Past Medical History:  Diagnosis Date   Diabetes mellitus without complication (HCC)    Past Surgical History:  Procedure Laterality Date   INTRAMEDULLARY (IM) NAIL INTERTROCHANTERIC Left 11/19/2023   Procedure: FIXATION, FRACTURE, INTERTROCHANTERIC, WITH INTRAMEDULLARY ROD;  Surgeon: Lorelle Hussar,  MD;  Location: [facility] ORS;  Service: Orthopedics;  Laterality: Left;   IR ANGIO INTRA EXTRACRAN SEL INTERNAL CAROTID BILAT MOD SED  07/05/2021   IR ANGIO VERTEBRAL SEL VERTEBRAL UNI R MOD SED  07/05/2021   IR RADIOLOGIST EVAL & MGMT  07/16/2021   IR US  GUIDE VASC ACCESS RIGHT  07/05/2021   History reviewed. No pertinent family history. Social History:  reports that she has been smoking cigarettes. She started smoking about 50 years ago. She has a 25.3 pack-year smoking history. She has never been exposed to tobacco smoke. She has never used smokeless tobacco. She reports that she does not currently use alcohol. She reports that she does not use drugs. Allergies:  Allergies  Allergen Reactions   Honey Bee Venom Anaphylaxis    Pt states she felt like she was on fire and required oxygen   Hydrocodone  Hives   Mercury Hives   Phenylmercuric Nitrate Other (See Comments)    BLISTERS   Medications Prior to Admission  Medication Sig Dispense Refill   Calcium  Carb-Cholecalciferol  (CALCIUM  + VITAMIN D3 PO) Take 1 tablet by mouth daily.     gabapentin  (NEURONTIN ) 800 MG tablet Take 800 mg by mouth 3 (three) times daily.     metFORMIN  (GLUCOPHAGE -XR) 500 MG 24 hr tablet Take 500 mg by mouth daily with breakfast.     vitamin B-12 (CYANOCOBALAMIN ) 1000 MCG tablet Take 1,000 mcg by mouth daily.     amLODipine  (NORVASC ) 5 MG tablet Take 5 mg by mouth daily. (Patient not taking: Reported on 11/19/2023)     aspirin  EC 81 MG tablet Take 81 mg by mouth daily. Swallow whole. (Patient not taking: Reported on 11/19/2023)     atorvastatin  (LIPITOR) 10 MG tablet Take 10 mg by mouth daily. (Patient not taking: Reported on 11/19/2023)     buPROPion  (ZYBAN ) 150 MG 12 hr tablet Take 150 mg by mouth 2 (two) times daily. (Patient not taking: Reported on 11/19/2023)     carvedilol  (COREG ) 25 MG tablet Take 25 mg by mouth 2 (two) times daily with a meal. (Patient not taking: Reported on 11/19/2023)     insulin  degludec  (TRESIBA ) 100 UNIT/ML FlexTouch Pen Inject 40 Units into the skin daily. (Patient not taking: Reported on 11/19/2023) 3 mL 2   lisinopril  (ZESTRIL ) 40 MG tablet Take 40 mg by mouth daily. (Patient not taking: Reported on 11/19/2023)     LYRICA  100 MG capsule Take 100 mg by mouth 2 (two) times daily. (Patient not taking: Reported on 11/19/2023)     meloxicam (MOBIC) 15 MG tablet Take 15 mg by mouth daily. (Patient not taking: Reported on 11/19/2023)     traMADol  (ULTRAM ) 50 MG tablet Take 1 tablet (50 mg total) by mouth every 12 (twelve) hours as needed. (Patient not taking: Reported on 11/19/2023) 10 tablet 0   TRULICITY 0.75 MG/0.5ML SOPN Inject 0.75 mg into the skin once a week. (Patient not taking: Reported on 11/19/2023)        Home: Home Living Family/patient expects to be discharged to:: Private residence Living Arrangements: Children, Other relatives (Pt's brother; pt's son; and pt's mother) Available Help at Discharge: Family, Available PRN/intermittently Type of Home: House Home Access: Ramped  entrance Home Layout: One level Bathroom Shower/Tub: Engineer, manufacturing systems: Handicapped height Home Equipment: Shower seat, Cane - single point, Hand held shower head Additional Comments: Pt has been using her mom's w/c in community at times.  Pt's brother is in/out during the day; pt's son works 10:30 am to 9 pm.  Pt (and pt's family) assist with caregiving for her mother.   Functional History: Prior Function Prior Level of Function : Needs assist Mobility Comments: Modified independent ambulating with RW household distances.  H/o falls. ADLs Comments: reports being IND with ADLs, walking to the bathroom and has shower chair  Functional Status:  Mobility: Bed Mobility Overal bed mobility: Needs Assistance Bed Mobility: Supine to Sit Supine to sit: Mod assist Sit to supine: Max assist, Used rails General bed mobility comments: assist for L LE, scooting hips to edge of bed, and  for trunk; vc's for use of bed rail and overall technique Transfers Overall transfer level: Needs assistance Equipment used: Left platform walker Transfers: Sit to/from Stand Sit to Stand: Max assist Bed to/from chair/wheelchair/BSC transfer type:: Squat pivot Squat pivot transfers: Max assist Step pivot transfers: Mod assist, +2 physical assistance General transfer comment: vc's for UE/LE positioning and overall technique; assist to come to full stand (vc's for upright posture) and assist to control descent sitting Ambulation/Gait Ambulation/Gait assistance: Mod assist Gait Distance (Feet): 2 Feet Assistive device: Left platform walker General Gait Details: antalgic; decreased stance time L LE; increased effort/time to take steps; pt taking small steps initially but then pivoting on R LE to turn towards recliner; vc's for technique Gait velocity: decreased    ADL: ADL Overall ADL's : Needs assistance/impaired Eating/Feeding: Modified independent Eating/Feeding Details (indicate cue type and reason): w/ encouragement from therapist, pt able to use L UE for feeding, w/ increased time/effort Grooming: Oral care, Sitting, Set up, Wash/dry face Grooming Details (indicate cue type and reason): able to perform oral care and wash her face using BUEs seated in recliner with set up assist only  Cognition: Cognition Orientation Level: Oriented X4 Cognition Arousal: Alert Behavior During Therapy: WFL for tasks assessed/performed  Physical Exam: Blood pressure 118/65, pulse 75, temperature 98.2 F (36.8 C), temperature source Oral, resp. rate 18, height 5' 4 (1.626 m), weight 69.9 kg, SpO2 94%. Physical Exam Vitals and nursing note reviewed. Exam conducted with a chaperone present.  Constitutional:      General: She is not in acute distress.    Appearance: Normal appearance. She is obese.     Comments: Pt awake, alert, appropriate, sitting up in bed; c/o bloated/lack of peeing/but  incontinent; NAD  HENT:     Head: Normocephalic and atraumatic.     Comments: Wearing dentures on top and bottom Eyes watering significantly- continually wiping eyes B/L Mild RIGHT sided facial droop (from prior stroke most likely) Tongue furrowed, very erythematous and almost beefy- no thrush plaques seen, but appears c/w thrush initially    Right Ear: External ear normal.     Left Ear: External ear normal.     Nose: Nose normal. No congestion.     Mouth/Throat:     Mouth: Mucous membranes are dry.     Pharynx: Posterior oropharyngeal erythema present. No oropharyngeal exudate.  Eyes:     General:        Right eye: Discharge present.        Left eye: Discharge present.    Extraocular Movements: Extraocular movements intact.     Comments: EOM I B/L  Cardiovascular:     Rate and Rhythm: Normal rate and regular rhythm.     Heart sounds: Normal heart sounds. No murmur heard.    No gallop.  Pulmonary:     Effort: Pulmonary effort is normal. No respiratory distress.     Breath sounds: Normal breath sounds. No wheezing, rhonchi or rales.  Abdominal:     Comments: Appears very distended- more firm than expected- also distended in lower abdomen/pelvis above bladder and surrounding area Mildly TTP in entire abdomen Hypoactive BS!  Genitourinary:    Comments: Very distended palpable bladder Musculoskeletal:        General: Swelling present.     Cervical back: Neck supple. No tenderness.     Comments: RUE- 5-/5 throughout LUE- Deltoid, biceps, triceps, WE, grip and FA 4/5 with severely impaired sequencing- couldn't easily participate in strength exam RLE- 4+/5 throughout- alittle weaker than RUE LLE- limited by pain and weakness proximally- HF, KE/KF 2-/5 and DF 2-/5 and PF 2/5  Moderate swelling on L thigh esp laterally around incision  Skin:    General: Skin is warm and dry.     Comments: Surgical dressing in place to left hip. Honeycomb dressing- no leakage outside dressing More  wrinkled in hands than expected, like dry/(+) tenting  Neurological:     Mental Status: She is alert.     Comments: Patient is awake and alert sitting up in bed.  Mood is a bit flat but appropriate.  Follows simple commands.  Provides name but some delay and providing age and biographical information. Tangential- hard to keep on topic but able to answer questions appropriately Decreased to light touch on RLE entire leg  Hoffman's B/L, but 2-3 beats clonus RLE, not on LLE- likely from prior CVA's?   Psychiatric:     Comments: Bright, pleasant, interactive     Results for orders placed or performed during the hospital encounter of 11/18/23 (from the past 48 hours)  Glucose, capillary     Status: Abnormal   Collection Time: 11/23/23  8:04 AM  Result Value Ref Range   Glucose-Capillary 131 (H) 70 - 99 mg/dL    Comment: Glucose reference range applies only to samples taken after fasting for at least 8 hours.  Glucose, capillary     Status: Abnormal   Collection Time: 11/23/23 11:42 AM  Result Value Ref Range   Glucose-Capillary 285 (H) 70 - 99 mg/dL    Comment: Glucose reference range applies only to samples taken after fasting for at least 8 hours.  Glucose, capillary     Status: Abnormal   Collection Time: 11/23/23  5:01 PM  Result Value Ref Range   Glucose-Capillary 318 (H) 70 - 99 mg/dL    Comment: Glucose reference range applies only to samples taken after fasting for at least 8 hours.  Glucose, capillary     Status: Abnormal   Collection Time: 11/23/23  7:52 PM  Result Value Ref Range   Glucose-Capillary 367 (H) 70 - 99 mg/dL    Comment: Glucose reference range applies only to samples taken after fasting for at least 8 hours.  Glucose, capillary     Status: Abnormal   Collection Time: 11/23/23 11:58 PM  Result Value Ref Range   Glucose-Capillary 184 (H) 70 - 99 mg/dL    Comment: Glucose reference range applies only to samples taken after fasting for at least 8 hours.  Glucose,  capillary     Status: Abnormal   Collection Time: 11/24/23  3:37  AM  Result Value Ref Range   Glucose-Capillary 149 (H) 70 - 99 mg/dL    Comment: Glucose reference range applies only to samples taken after fasting for at least 8 hours.  Glucose, capillary     Status: Abnormal   Collection Time: 11/24/23  8:06 AM  Result Value Ref Range   Glucose-Capillary 162 (H) 70 - 99 mg/dL    Comment: Glucose reference range applies only to samples taken after fasting for at least 8 hours.  Glucose, capillary     Status: Abnormal   Collection Time: 11/24/23 11:56 AM  Result Value Ref Range   Glucose-Capillary 234 (H) 70 - 99 mg/dL    Comment: Glucose reference range applies only to samples taken after fasting for at least 8 hours.  Glucose, capillary     Status: Abnormal   Collection Time: 11/24/23  3:58 PM  Result Value Ref Range   Glucose-Capillary 259 (H) 70 - 99 mg/dL    Comment: Glucose reference range applies only to samples taken after fasting for at least 8 hours.  Glucose, capillary     Status: Abnormal   Collection Time: 11/24/23  8:26 PM  Result Value Ref Range   Glucose-Capillary 294 (H) 70 - 99 mg/dL    Comment: Glucose reference range applies only to samples taken after fasting for at least 8 hours.   Comment 1 Notify RN   Glucose, capillary     Status: Abnormal   Collection Time: 11/25/23 12:44 AM  Result Value Ref Range   Glucose-Capillary 298 (H) 70 - 99 mg/dL    Comment: Glucose reference range applies only to samples taken after fasting for at least 8 hours.   Comment 1 Notify RN   Glucose, capillary     Status: Abnormal   Collection Time: 11/25/23  4:13 AM  Result Value Ref Range   Glucose-Capillary 175 (H) 70 - 99 mg/dL    Comment: Glucose reference range applies only to samples taken after fasting for at least 8 hours.   Comment 1 Notify RN    No results found.    Blood pressure 118/65, pulse 75, temperature 98.2 F (36.8 C), temperature source Oral, resp. rate  18, height 5' 4 (1.626 m), weight 69.9 kg, SpO2 94%.  Medical Problem List and Plan: 1. 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  Possible punctate infarct left middle cerebellar peduncle versus artifact.  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  -patient may  shower-cover L hip dressing with occlusive dressing  -ELOS/Goals: 13-15 days- mod I to supervision  Admit to CIR 2.  Antithrombotics: -DVT/anticoagulation:  Pharmaceutical: Lovenox .  Check vascular study  -antiplatelet therapy: Aspirin  81 mg daily and Plavix  75 mg daily 3. Pain Management: Neurontin  800 mg 3 times daily, Voltaren  gel 4 g 4 times daily to affected area, oxycodone  5 mg every 4 hours as needed, tramadol  50 mg every 6 hours as needed, Robaxin  500 mg every 8 hours as needed- of note, doesn't like to take meds- encouraged her to do so prior to therapy 4. Mood/Behavior/Sleep: Wellbutrin  150 mg twice daily, trazodone  25 mg nightly as needed  -antipsychotic agents: N/A 5. Neuropsych/cognition: This patient is capable of making decisions on her own behalf. 6. Skin/Wound Care: Routine skin checks 7. Fluids/Electrolytes/Nutrition: Routine in and outs with follow-up chemistries 8.  Left nondisplaced intertrochanteric hip fracture of the proximal  left femur.  Status post IM nailing 11/19/2023.  Weightbearing as tolerated 9.  Acute blood loss anemia.  Follow-up CBC 10.  Hypertension.  Norvasc  10 mg daily, Coreg  25 mg twice daily, lisinopril  40 mg daily.  Monitor with increased mobility 11.  Diabetes mellitus.  Hemoglobin A1c 12.4.  Currently on Lantus  insulin  15 units every 24 hours.  Check blood sugars AC and at bedtime.  Prior to admission patient on Glucophage  500 mg daily, Trulicity weekly 12.  AKI on CKD stage IIIB.  Follow-up chemistries 13.  Tobacco abuse.  NicoDerm patch.   Provide counseling 14.  Hyperlipidemia.  Lipitor 15.  Constipation.  Adjust bowel program as needed- gave Sorbitol  30cc since lBM 4-5 days ago- if doesn't have good results, suggest Soap Suds enema.  16. Urinary retention with overflow- ordered bladder scans and to cath q6 hours prn if bladder scan >350cc- also ordered U/A and Cx, and Flomax  0.4 mg q supper -at this time U/A is in process- I ordered in/out caths if volumes >350cc myself  17. Mild spasticity- monitor for symptoms 18. Of note, pt's mother is 48 years old and still doing functionally well! 19. Thrush- ordered nystatin  5ml q6 hours for mild thrush- might need Diflucan- monitor   Toribio JINNY Pitch, PA-C 11/25/2023  I spent a total of 89   minutes on  total care personally today- >50% coordination of care- due to review of chart, d/w pt about B/B and taste issues; as well as spasticity and need for in/out caths- added lidocaine  jelly as well- also ordered labs, and reviewed current labs as well .    I have personally performed a face to face diagnostic evaluation of this patient and formulated the key components of the plan.  Additionally, I have personally reviewed laboratory data, imaging studies, as well as relevant notes and concur with the physician assistant's documentation above.   The patient's status has not changed from the original H&P.  Any changes in documentation from the acute care chart have been noted above.

## 2023-11-25 NOTE — Progress Notes (Signed)
 Inpatient Rehab Admissions Coordinator:   I spoke to pt on the phone and she is agreeable to do rehab at [facility].  I spoke to her family member, Cara, at [phone], and he is also in agreement to pursue CIR.  I reviewed other family members at home are pt's mom, pt's brother, and a cousin, in addition to [facility].  D/C order already in.  I will arrange for transport with [facility] as soon as I have a ready bed.  RN can call for report at [phone] at any time.    Reche Lowers, PT, DPT Admissions Coordinator [phone] 11/25/23  10:33 AM

## 2023-11-25 NOTE — Plan of Care (Signed)
   Problem: Education: Goal: Knowledge of General Education information will improve Description: Including pain rating scale, medication(s)/side effects and non-pharmacologic comfort measures Outcome: Progressing   Problem: Health Behavior/Discharge Planning: Goal: Ability to manage health-related needs will improve Outcome: Progressing   Problem: Nutrition: Goal: Adequate nutrition will be maintained Outcome: Progressing

## 2023-11-25 NOTE — Discharge Summary (Signed)
 Physician Discharge Summary   Patient: [name] MRN: [id number] DOB: 1950-08-14  Admit date:     11/18/2023  Discharge date: 11/25/23  Discharge Physician: Drue ONEIDA Potter   PCP: Kandis Stefano Iles, MD   Recommendations at discharge:  Follow-up with neurologist, cardiology, orthopedics  Discharge Diagnoses: Principal Problem:   Closed left hip fracture, initial encounter (HCC) Active Problems:   Tobacco abuse   Essential hypertension   Type 2 diabetes mellitus with peripheral neuropathy (HCC)   Dyslipidemia Acute ischemic stroke  Resolved Problems:   * No resolved hospital problems. [facility] Course:  From HPI [name] is a 73 y.o. female with medical history significant for type diabetes mellitus, essential hypertension and dyslipidemia and intracranial aneurysm for which she declined intervention in 2023 at [facility], who presented to the emergency room with acute onset of vertigo with subsequent fall while washing her dishes and manage left hip pain.  She denies any presyncope or syncope.  She denies any bleeding diathesis.  No chest pain or palpitations.  No paresthesias or focal muscle weakness.  No witnessed seizures.  No nausea or vomiting or abdominal pain.  No cough or wheezing or dyspnea.  No chest pain or palpitations.   ED Course: When she came to the ER, BP was 157/73 with temperature 97.1 with otherwise normal vital signs.  Labs revealed a blood glucose of 370 with otherwise albumin of 3.3 and creatinine 1.15.  High-sensitivity troponin was 19 and later 18 and CBC was normal.  Blood group was A+ with negative antibody screen.  PT and INR were normal. EKG as reviewed by me : Normal sinus rhythm with rate of 81. Imaging: Noncontrasted CT scan showed marked severe multilevel degenerative changes without evidence of an acute fracture or subluxation. Chest x-ray showed no acute cardiopulmonary disease.  Hip and pelvic x-ray revealed no acute osseous  abnormality. Left hip CT scan showed acute nondisplaced intertrochanteric fracture of the proximal left femur.    Other hospital course as noted below:   Assessment and Plan: Closed left hip fracture, initial encounter [facility]) - The patient will be admitted to a telemetry bed. S/p repair on 11/19/2023 Patient will follow-up with orthopedics 2 weeks postoperatively Patient being admitted to inpatient rehab   Acute ischemic stroke Follow-up with neurologist outpatient Continue aspirin  and Plavix  indefinitely Continue statin therapy Echo reviewed showing EF of 35 to 40% Cardiologist will follow-up with patient as an outpatient   Tobacco abuse She was counseled for smoking cessation    Dyslipidemia Continue statin therapy.   Type 2 diabetes mellitus with peripheral neuropathy (HCC) Continue home medication   Essential hypertension Continue antihypertensive therapy.      Consultants: As mentioned above Procedures performed: As mentioned above Disposition: Acute inpatient rehab Diet recommendation:  Cardiac and Carb modified diet DISCHARGE MEDICATION: Allergies as of 11/25/2023       Reactions   Honey Bee Venom Anaphylaxis   Pt states she felt like she was on fire and required oxygen   Hydrocodone  Hives   Mercury Hives   Phenylmercuric Nitrate Other (See Comments)   BLISTERS        Medication List     STOP taking these medications    Lyrica  100 MG capsule Generic drug: pregabalin    meloxicam 15 MG tablet Commonly known as: MOBIC   traMADol  50 MG tablet Commonly known as: Ultram        TAKE these medications    acetaminophen  325 MG tablet Commonly known  as: TYLENOL  Take 1-2 tablets (325-650 mg total) by mouth every 6 (six) hours as needed for mild pain (pain score 1-3) or fever (or temp > 100.5).   amLODipine  10 MG tablet Commonly known as: NORVASC  Take 1 tablet (10 mg total) by mouth daily. What changed:  medication strength how much to take    aspirin  EC 81 MG tablet Take 81 mg by mouth daily. Swallow whole.   atorvastatin  40 MG tablet Commonly known as: LIPITOR Take 1 tablet (40 mg total) by mouth daily. What changed:  medication strength how much to take   buPROPion  150 MG 12 hr tablet Commonly known as: ZYBAN  Take 150 mg by mouth 2 (two) times daily.   CALCIUM  + VITAMIN D3 PO Take 1 tablet by mouth daily.   carvedilol  25 MG tablet Commonly known as: COREG  Take 25 mg by mouth 2 (two) times daily with a meal.   clopidogrel  75 MG tablet Commonly known as: PLAVIX  Take 1 tablet (75 mg total) by mouth daily.   cyanocobalamin  1000 MCG tablet Commonly known as: VITAMIN B12 Take 1,000 mcg by mouth daily.   docusate sodium  100 MG capsule Commonly known as: COLACE Take 1 capsule (100 mg total) by mouth 2 (two) times daily.   enoxaparin  40 MG/0.4ML injection Commonly known as: LOVENOX  Inject 0.4 mLs (40 mg total) into the skin daily for 14 days.   gabapentin  800 MG tablet Commonly known as: NEURONTIN  Take 800 mg by mouth 3 (three) times daily.   insulin  degludec 100 UNIT/ML FlexTouch Pen Commonly known as: TRESIBA  Inject 40 Units into the skin daily.   lisinopril  40 MG tablet Commonly known as: ZESTRIL  Take 40 mg by mouth daily.   metFORMIN  500 MG 24 hr tablet Commonly known as: GLUCOPHAGE -XR Take 500 mg by mouth daily with breakfast.   oxyCODONE  5 MG immediate release tablet Commonly known as: Oxy IR/ROXICODONE  Take 1 tablet (5 mg total) by mouth every 4 (four) hours as needed for severe pain (pain score 7-10).   Trulicity 0.75 MG/0.5ML Soaj Generic drug: Dulaglutide Inject 0.75 mg into the skin once a week.        Follow-up Information     Charlene Debby BROCKS, PA-C Follow up in 2 week(s).   Specialties: Orthopedic Surgery, Emergency Medicine Contact information: [address] [place] [address] [phone]                Discharge Exam: Fredricka Weights   11/18/23 2338   Weight: 69.9 kg   GENERAL: elderly female laying in bed in no acute distress LUNGS: Normal breath sounds bilaterally CARDIOVASCULAR: Regular rate and rhythm ABDOMEN: Soft, nondistended, nontender. Bowel sounds present. No organomegaly or mass.  EXTREMITIES: No pedal edema, cyanosis, or clubbing. Musculoskeletal: Left lateral hip tenderness. NEUROLOGIC: Cranial nerves II through XII are intact. Muscle strength 5/5 in all extremities. Sensation intact. Gait not checked.  PSYCHIATRIC: The patient is alert and oriented x 3.  Normal affect and good eye contact. SKIN: No obvious rash, lesion, or ulcer.  Condition at discharge: good  The results of significant diagnostics from this hospitalization (including imaging, microbiology, ancillary and laboratory) are listed below for reference.   Imaging Studies: ECHOCARDIOGRAM COMPLETE Result Date: 11/20/2023    ECHOCARDIOGRAM REPORT   Patient Name:   ANALYCE TAVARES Haver Date of Exam: 11/20/2023 Medical Rec #:  [id number]        Height:       64.0 in Accession #:    [id number]  Weight:       154.0 lb Date of Birth:  03-Nov-1950        BSA:          1.751 m Patient Age:    73 years         BP:           130/66 mmHg Patient Gender: F                HR:           78 bpm. Exam Location:  [facility] Procedure: 2D Echo, Cardiac Doppler and Color Doppler (Both Spectral and Color            Flow Doppler were utilized during procedure). Indications:     Stroke I63.9  History:         Patient has no prior history of Echocardiogram examinations.                  Stroke.  Sonographer:     Rosina Dunk Referring Phys:  [id number] DRUE ONEIDA POTTER Diagnosing Phys: Evalene Lunger MD IMPRESSIONS  1. Left ventricular ejection fraction, by estimation, is 35 to 40%. Left ventricular ejection fraction by 2D MOD biplane is 36.4 %. The left ventricle has moderately decreased function. The left ventricle demonstrates global hypokinesis. There is mild asymmetric left ventricular  hypertrophy. Left ventricular diastolic parameters are consistent with Grade I diastolic dysfunction (impaired relaxation).  2. Right ventricular systolic function is low normal. The right ventricular size is normal. There is normal pulmonary artery systolic pressure. The estimated right ventricular systolic pressure is 26.9 mmHg.  3. The mitral valve is normal in structure. Trivial mitral valve regurgitation. No evidence of mitral stenosis.  4. Tricuspid valve regurgitation is mild to moderate.  5. The aortic valve is tricuspid. There is mild calcification of the aortic valve. Aortic valve regurgitation is not visualized. Aortic valve sclerosis/calcification is present, without any evidence of aortic stenosis.  6. The inferior vena cava is normal in size with greater than 50% respiratory variability, suggesting right atrial pressure of 3 mmHg. FINDINGS  Left Ventricle: Left ventricular ejection fraction, by estimation, is 35 to 40%. Left ventricular ejection fraction by 2D MOD biplane is 36.4 %. The left ventricle has moderately decreased function. The left ventricle demonstrates global hypokinesis. Strain was performed and the global longitudinal strain is indeterminate. The left ventricular internal cavity size was normal in size. There is mild asymmetric left ventricular hypertrophy. Left ventricular diastolic parameters are consistent with Grade  I diastolic dysfunction (impaired relaxation). Right Ventricle: The right ventricular size is normal. No increase in right ventricular wall thickness. Right ventricular systolic function is low normal. There is normal pulmonary artery systolic pressure. The tricuspid regurgitant velocity is 2.34 m/s,  and with an assumed right atrial pressure of 5 mmHg, the estimated right ventricular systolic pressure is 26.9 mmHg. Left Atrium: Left atrial size was normal in size. Right Atrium: Right atrial size was normal in size. Pericardium: There is no evidence of pericardial  effusion. Mitral Valve: The mitral valve is normal in structure. Trivial mitral valve regurgitation. No evidence of mitral valve stenosis. MV peak gradient, 4.5 mmHg. The mean mitral valve gradient is 2.0 mmHg. Tricuspid Valve: The tricuspid valve is normal in structure. Tricuspid valve regurgitation is mild to moderate. No evidence of tricuspid stenosis. Aortic Valve: The aortic valve is tricuspid. There is mild calcification of the aortic valve. Aortic valve regurgitation is not visualized. Aortic valve sclerosis/calcification is present,  without any evidence of aortic stenosis. Aortic valve mean gradient measures 4.0 mmHg. Aortic valve peak gradient measures 6.7 mmHg. Aortic valve area, by VTI measures 1.10 cm. Pulmonic Valve: The pulmonic valve was not well visualized. Pulmonic valve regurgitation is trivial. No evidence of pulmonic stenosis. Aorta: The aortic root and ascending aorta are structurally normal, with no evidence of dilitation. Venous: The pulmonary veins were not well visualized. The inferior vena cava is normal in size with greater than 50% respiratory variability, suggesting right atrial pressure of 3 mmHg. IAS/Shunts: No atrial level shunt detected by color flow Doppler. Additional Comments: 3D was performed not requiring image post processing on an independent workstation and was indeterminate.  LEFT VENTRICLE PLAX 2D                        Biplane EF (MOD) LVIDd:         5.01 cm         LV Biplane EF:   Left LVIDs:         3.77 cm                          ventricular LV PW:         1.35 cm                          ejection LV IVS:        0.82 cm                          fraction by LVOT diam:     1.70 cm                          2D MOD LV SV:         28                               biplane is LV SV Index:   16                               36.4 %. LVOT Area:     2.27 cm                                Diastology                                LV e' medial:    4.45 cm/s LV Volumes (MOD)                LV E/e' medial:  17.0 LV vol d, MOD    68.4 ml       LV e' lateral:   4.45 cm/s A2C:                           LV E/e' lateral: 17.0 LV vol d, MOD    65.8 ml A4C: LV vol s, MOD    37.1 ml A2C: LV vol s, MOD    45.3 ml A4C: LV SV MOD A2C:   31.3 ml LV SV MOD A4C:  65.8 ml LV SV MOD BP:    24.6 ml RIGHT VENTRICLE             IVC RV Basal diam:  3.38 cm     IVC diam: 1.48 cm RV Mid diam:    1.87 cm RV S prime:     11.30 cm/s TAPSE (M-mode): 1.6 cm LEFT ATRIUM             Index        RIGHT ATRIUM           Index LA diam:        2.40 cm 1.37 cm/m   RA Area:     11.70 cm LA Vol (A2C):   49.0 ml 27.99 ml/m  RA Volume:   27.40 ml  15.65 ml/m LA Vol (A4C):   50.6 ml 28.90 ml/m LA Biplane Vol: 49.9 ml 28.50 ml/m  AORTIC VALVE                    PULMONIC VALVE AV Area (Vmax):    1.10 cm     PV Vmax:        0.75 m/s AV Area (Vmean):   1.07 cm     PV Vmean:       51.900 cm/s AV Area (VTI):     1.10 cm     PV VTI:         0.133 m AV Vmax:           129.00 cm/s  PV Peak grad:   2.3 mmHg AV Vmean:          96.300 cm/s  PV Mean grad:   1.0 mmHg AV VTI:            0.258 m      RVOT Peak grad: 2 mmHg AV Peak Grad:      6.7 mmHg AV Mean Grad:      4.0 mmHg LVOT Vmax:         62.50 cm/s LVOT Vmean:        45.200 cm/s LVOT VTI:          0.125 m LVOT/AV VTI ratio: 0.48  AORTA Ao Root diam: 2.80 cm Ao Asc diam:  2.80 cm MITRAL VALVE                TRICUSPID VALVE MV Area (PHT): 5.58 cm     TR Peak grad:   21.9 mmHg MV Area VTI:   0.99 cm     TR Vmax:        234.00 cm/s MV Peak grad:  4.5 mmHg MV Mean grad:  2.0 mmHg     SHUNTS MV Vmax:       1.06 m/s     Systemic VTI:  0.12 m MV Vmean:      69.2 cm/s    Systemic Diam: 1.70 cm MV Decel Time: 136 msec     Pulmonic VTI:  0.136 m MV E velocity: 75.70 cm/s MV A velocity: 101.00 cm/s MV E/A ratio:  0.75 Evalene Lunger MD Electronically signed by Evalene Lunger MD Signature Date/Time: 11/20/2023/12:04:38 PM    Final    CT ANGIO HEAD NECK W WO CM Result Date:  11/20/2023 CLINICAL DATA:  Follow-up examination for stroke. EXAM: CT ANGIOGRAPHY HEAD AND NECK WITH AND WITHOUT CONTRAST TECHNIQUE: Multidetector CT imaging of the head and neck was performed using the standard protocol during bolus administration of intravenous contrast. Multiplanar CT image reconstructions and  MIPs were obtained to evaluate the vascular anatomy. Carotid stenosis measurements (when applicable) are obtained utilizing NASCET criteria, using the distal internal carotid diameter as the denominator. RADIATION DOSE REDUCTION: This exam was performed according to the departmental dose-optimization program which includes automated exposure control, adjustment of the mA and/or kV according to patient size and/or use of iterative reconstruction technique. CONTRAST:  75mL OMNIPAQUE  IOHEXOL  350 MG/ML SOLN COMPARISON:  Prior MRI from 11/19/2023 FINDINGS: CT HEAD FINDINGS Brain: Generalized age-related cerebral atrophy with mild chronic microvascular ischemic disease. Few scatter remote lacunar infarcts noted about the deep gray nuclei. Previously identified infarct involving the posterior right basal ganglia again seen, grossly stable. No other visible acute large vessel territory infarct by CT. No acute intracranial hemorrhage. No mass lesion or midline shift. No hydrocephalus or extra-axial fluid collection. Vascular: No abnormal hyperdense vessel. Scattered vascular calcifications noted within the carotid siphons. Skull: Scalp soft tissues within normal limits.  Calvarium intact. Sinuses/Orbits: Globes and orbital soft tissues within normal limits. Visualized paranasal sinuses and mastoid air cells are largely clear. Other: None. Review of the MIP images confirms the above findings CTA NECK FINDINGS Aortic arch: Limits visualized arch within normal limits for caliber with standard branch pattern. Moderate aortic atherosclerosis. No significant stenosis about the origin the great vessels. Right carotid  system: Right common and internal carotid arteries are patent without dissection. Atheromatous change about the right carotid bulb without hemodynamically significant greater than 50% stenosis. Left carotid system: Left common and internal carotid arteries are patent without dissection. Mild atheromatous change about the left mandible without hemodynamically severe greater than 50% stenosis. Vertebral arteries: Both vertebral arteries arise from subclavian arteries. Proximal left vertebral artery not well seen due to adjacent venous contamination. Visualized vertebral arteries patent without stenosis or dissection. Skeleton: No worrisome osseous lesions. Advanced spondylosis noted throughout the visualized cervicothoracic spine. Patient is edentulous. Other neck: No other acute finding. Upper chest: No other acute finding. Review of the MIP images confirms the above findings CTA HEAD FINDINGS Anterior circulation: Atheromatous change about the carotid siphons with associated mild to moderate stenoses bilaterally. A1 segments patent bilaterally. Normal anterior communicating artery complex. Anterior cerebral arteries patent without significant stenosis. Normal in stenosis or occlusion. 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 No proximal MCA branch occlusion. Distal MCA branches perfused and symmetric. Posterior circulation: Both V4 segments patent without stenosis. 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 Left PICA patent at its origin. Right PICA not well seen. Basilar patent without stenosis. Superior cerebellar arteries patent bilaterally. Both PCAs primarily supplied via the basilar. Atheromatous change about the PCAs with associated moderate left P2 and severe right P3 stenoses (series 13, image 23). Arteries Venous sinuses: Grossly patent allowing for timing the  contrast bolus. Anatomic variants: None significant. Review of the MIP images confirms the above findings IMPRESSION: CT HEAD: 1. No significant interval change in previously identified infarct involving the posterior right basal ganglia. No associated hemorrhage or mass effect. 2. No other new acute intracranial abnormality. 3. Underlying age-related cerebral atrophy with mild chronic small vessel ischemic disease. CTA HEAD AND NECK: 1. Negative CTA for large vessel occlusion or other emergent finding. 2. Atheromatous change about the carotid siphons with associated mild to moderate stenoses bilaterally. 3. Atheromatous change about the PCAs with associated moderate left P2 and severe right P3 stenoses. 4. 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 5.  Aortic Atherosclerosis (ICD10-I70.0). Electronically Signed   By: Morene Hoard M.D.   On: 11/20/2023 03:36   DG HIP UNILAT WITH PELVIS 2-3 VIEWS LEFT Result Date: 11/19/2023 EXAM: 2 or 3 VIEW(S) XRAY OF THE PELVIS AND LEFT HIP 11/19/2023 02:36:00 PM COMPARISON: None available. CLINICAL HISTORY: [id number] Surgery, elective [id number]. Left IM Nail; FINDINGS: Six fluoroscopic spot views of the left hip submitted from the operating room. Femoral intramedullary nail with transtrochanteric and distal locking screw fixation traverses proximal femur. Known fracture is not well demonstrated on the current exam. Fluoroscopy dose 18.40mg y; fluoroscopy time 1 min 43 sec IMPRESSION: 1. Procedural fluoroscopy during left proximal femur fracture fixation. Electronically signed by: Andrea Gasman MD 11/19/2023 05:16 PM EDT RP Workstation: [id number]   DG C-Arm 1-60 Min-No Report Result Date: 11/19/2023 Fluoroscopy was utilized by the requesting physician.  No radiographic interpretation.   MR HIP LEFT WO CONTRAST Result Date: 11/19/2023 CLINICAL DATA:  Fall, intertrochanteric fracture EXAM: MR  OF THE LEFT HIP WITHOUT CONTRAST TECHNIQUE: Multiplanar, multisequence MR imaging was performed. No intravenous contrast was administered. COMPARISON:  CT scan 11/17/2023 FINDINGS: Bones: MRI confirms the left intertrochanteric fracture, with high visibility of the fracture plane along the greater trochanter and mid intertrochanteric region, reduced visibility of lesser trochanteric extension. This extension is best seen extending along the proximal margin of the lesser trochanter posteriorly for example on image 18 series 3. No other fracture identified. Accentuated T2 signal in the pubic symphysis. Articular cartilage and labrum Articular cartilage:  Moderate degenerative chondral thinning Labrum:  Grossly intact. Joint or bursal effusion Joint effusion: Upper normal amount of fluid in the left hip joint without overt effusion. Bursae: Mild trochanteric bursitis. Muscles and tendons Muscles and tendons: Edema in the quadratus femoris muscle, image 25 series 14. Chronic appearing partial tearing of the right hamstring tendon. Mild edema tracks around the distal iliopsoas tendon. Other findings Miscellaneous: Lower lumbar spondylosis and degenerative disc disease. IMPRESSION: 1. MRI confirms the left intertrochanteric fracture, with high visibility of the fracture plane along the greater trochanter and mid intertrochanteric region, reduced visibility of lesser trochanteric extension which is best seen posteriorly. 2. Mild trochanteric bursitis. 3. Edema in the quadratus femoris muscle. 4. Chronic appearing partial tearing of the right (contralateral) hamstring tendon. 5. Lower lumbar spondylosis and degenerative disc disease. Electronically Signed   By: Ryan Salvage M.D.   On: 11/19/2023 09:25   MR BRAIN WO CONTRAST Result Date: 11/19/2023 CLINICAL DATA:  Neuro deficit, acute, stroke suspected EXAM: MRI HEAD WITHOUT CONTRAST TECHNIQUE: Multiplanar, multiecho pulse sequences of the brain and surrounding  structures were obtained without intravenous contrast. COMPARISON:  CT head 11/19/2023. FINDINGS: Brain: Acute infarct in the posterior limb of the right internal capsule superiorly. Possible punctate acute infarct in the left middle cerebellar peduncle versus artifact (see series 5, image 15). Remote lacunar infarcts in the pons, right corona radiata and left basal ganglia. Scattered T2/FLAIR hyperintensities the white matter, compatible with chronic microvascular ischemic disease. No acute hemorrhage, mass lesion, midline shift or hydrocephalus. Cerebral atrophy. Evidence of prior hemorrhage in the inferior left basal ganglia. Vascular: Major arterial flow voids are maintained at the skull base. Skull and upper cervical spine: Normal marrow signal. Sinuses/Orbits: Negative. Other: No mastoid effusions. IMPRESSION: 1. Acute infarct in the posterior limb of the right internal capsule superiorly. 2. Possible punctate acute infarct in the left  middle cerebellar peduncle versus artifact. 3. Remote lacunar infarcts in the pons, left basal ganglia and right corona radiata. Electronically Signed   By: Gilmore GORMAN Molt M.D.   On: 11/19/2023 03:39   DG Chest Portable 1 View Result Date: 11/19/2023 CLINICAL DATA:  Left hip fracture sent for preoperative evaluation. EXAM: PORTABLE CHEST 1 VIEW COMPARISON:  March 01, 2023 FINDINGS: The cardiac silhouette is enlarged and unchanged in size. Mild, stable atelectasis and/or scarring is seen within the left lung base. No pleural effusion or pneumothorax is identified. Multilevel degenerative changes seen throughout the thoracic spine. IMPRESSION: Stable cardiomegaly with mild left basilar atelectasis and/or scarring. Electronically Signed   By: Suzen Dials M.D.   On: 11/19/2023 02:44   CT Hip Left Wo Contrast Result Date: 11/19/2023 CLINICAL DATA:  Status post fall. EXAM: CT OF THE LEFT HIP WITHOUT CONTRAST TECHNIQUE: Multidetector CT imaging of the left hip was  performed according to the standard protocol. Multiplanar CT image reconstructions were also generated. RADIATION DOSE REDUCTION: This exam was performed according to the departmental dose-optimization program which includes automated exposure control, adjustment of the mA and/or kV according to patient size and/or use of iterative reconstruction technique. COMPARISON:  None Available. FINDINGS: Bones/Joint/Cartilage An acute, nondisplaced inter trochanteric fracture of the proximal left femur is seen. There is no evidence of dislocation. No significant arthropathy or focal bone lesions are identified. Ligaments Suboptimally assessed by CT. Muscles and Tendons Limited in evaluation and otherwise unremarkable. Soft tissues Very mild subcutaneous inflammatory fat stranding is seen along the lateral aspect of the left hip. IMPRESSION: Acute, nondisplaced intertrochanteric fracture of the proximal left femur. Electronically Signed   By: Suzen Dials M.D.   On: 11/19/2023 01:36   CT Cervical Spine Wo Contrast Result Date: 11/19/2023 CLINICAL DATA:  Dizziness with subsequent fall. EXAM: CT CERVICAL SPINE WITHOUT CONTRAST TECHNIQUE: Multidetector CT imaging of the cervical spine was performed without intravenous contrast. Multiplanar CT image reconstructions were also generated. RADIATION DOSE REDUCTION: This exam was performed according to the departmental dose-optimization program which includes automated exposure control, adjustment of the mA and/or kV according to patient size and/or use of iterative reconstruction technique. COMPARISON:  October 24, 2023 FINDINGS: Alignment: There is reversal of the normal cervical spine lordosis. Skull base and vertebrae: No acute fracture. No primary bone lesion or focal pathologic process. Soft tissues and spinal canal: No prevertebral fluid or swelling. No visible canal hematoma. Disc levels: Marked severity multilevel endplate sclerosis, anterior osteophyte formation and  posterior bony spurring are seen at the levels of C3-C4, C4-C5, C5-C6, C6-C7 and C7-T1. Marked severity intervertebral disc space narrowing is also seen at C3-C4, C4-C5, C5-C6, C6-C7 and C7-T1. Bilateral marked severity multilevel facet joint hypertrophy is noted. Upper chest: Negative. Other: None. IMPRESSION: Marked severity multilevel degenerative changes without evidence of an acute fracture or subluxation. Electronically Signed   By: Suzen Dials M.D.   On: 11/19/2023 01:00   CT HEAD WO CONTRAST ( ) Result Date: 11/19/2023 CLINICAL DATA:  Dizziness and subsequent fall. EXAM: CT HEAD WITHOUT CONTRAST TECHNIQUE: Contiguous axial images were obtained from the base of the skull through the vertex without intravenous contrast. RADIATION DOSE REDUCTION: This exam was performed according to the departmental dose-optimization program which includes automated exposure control, adjustment of the mA and/or kV according to patient size and/or use of iterative reconstruction technique. COMPARISON:  None Available. FINDINGS: Brain: There is generalized cerebral atrophy with widening of the extra-axial spaces and ventricular dilatation. There are areas of  decreased attenuation within the white matter tracts of the supratentorial brain, consistent with microvascular disease changes. Chronic left basal ganglia lacunar infarcts are seen. Vascular: No hyperdense vessel or unexpected calcification. Skull: Normal. Negative for fracture or focal lesion. Sinuses/Orbits: No acute finding. Other: None. IMPRESSION: 1. Generalized cerebral atrophy and microvascular disease changes of the supratentorial brain. 2. Chronic left basal ganglia lacunar infarcts. 3. No acute intracranial abnormality. Electronically Signed   By: Suzen Dials M.D.   On: 11/19/2023 00:58   DG Hip Unilat With Pelvis 2-3 Views Left Result Date: 11/19/2023 CLINICAL DATA:  Dizziness with subsequent fall. EXAM: DG HIP (WITH OR WITHOUT PELVIS) 2-3V  LEFT COMPARISON:  None Available. FINDINGS: There is no evidence of acute hip fracture or dislocation. Mild degenerative changes are seen involving both hips in the form of joint space narrowing. Marked severity degenerative changes are present within the visualized portion of the lower lumbar spine. IMPRESSION: No acute osseous abnormality. Electronically Signed   By: Suzen Dials M.D.   On: 11/19/2023 00:53    Microbiology: Results for orders placed or performed during the hospital encounter of 05/26/21  Resp Panel by RT-PCR (Flu A&B, Covid) Nasopharyngeal Swab     Status: None   Collection Time: 05/26/21  6:50 AM   Specimen: Nasopharyngeal Swab; Nasopharyngeal(NP) swabs in vial transport medium  Result Value Ref Range Status   SARS Coronavirus 2 by RT PCR NEGATIVE NEGATIVE Final    Comment: (NOTE) SARS-CoV-2 target nucleic acids are NOT DETECTED.  The SARS-CoV-2 RNA is generally detectable in upper respiratory specimens during the acute phase of infection. The lowest concentration of SARS-CoV-2 viral copies this assay can detect is 138 copies/mL. A negative result does not preclude SARS-Cov-2 infection and should not be used as the sole basis for treatment or other patient management decisions. A negative result may occur with  improper specimen collection/handling, submission of specimen other than nasopharyngeal swab, presence of viral mutation(s) within the areas targeted by this assay, and inadequate number of viral copies(<138 copies/mL). A negative result must be combined with clinical observations, patient history, and epidemiological information. The expected result is Negative.  Fact Sheet for Patients:  BloggerCourse.com  Fact Sheet for Healthcare Providers:  SeriousBroker.it  This test is no t yet approved or cleared by the [place]  FDA and  has been authorized for detection and/or diagnosis of SARS-CoV-2  by FDA under an Emergency Use Authorization (EUA). This EUA will remain  in effect (meaning this test can be used) for the duration of the COVID-19 declaration under Section 564(b)(1) of the Act, 21 U.S.C.section 360bbb-3(b)(1), unless the authorization is terminated  or revoked sooner.       Influenza A by PCR NEGATIVE NEGATIVE Final   Influenza B by PCR NEGATIVE NEGATIVE Final    Comment: (NOTE) The Xpert Xpress SARS-CoV-2/FLU/RSV plus assay is intended as an aid in the diagnosis of influenza from Nasopharyngeal swab specimens and should not be used as a sole basis for treatment. Nasal washings and aspirates are unacceptable for Xpert Xpress SARS-CoV-2/FLU/RSV testing.  Fact Sheet for Patients: BloggerCourse.com  Fact Sheet for Healthcare Providers: SeriousBroker.it  This test is not yet approved or cleared by the [place]  FDA and has been authorized for detection and/or diagnosis of SARS-CoV-2 by FDA under an Emergency Use Authorization (EUA). This EUA will remain in effect (meaning this test can be used) for the duration of the COVID-19 declaration under Section 564(b)(1) of the Act, 21 U.S.C. section 360bbb-3(b)(1), unless the  authorization is terminated or revoked.  Performed at [facility], [address] [place], [place] [address]     Labs: CBC: Recent Labs  Lab 11/18/23 2341 11/19/23 9364 11/20/23 0421 11/21/23 0325 11/22/23 0622  WBC 8.9 8.1 8.7 8.3 7.9  HGB 12.7 11.3* 10.9* 11.5* 10.3*  HCT 38.9 36.1 33.9* 35.2* 32.0*  MCV 89.4 90.3 90.2 89.1 88.6  PLT 201 173 171 168 167   Basic Metabolic Panel: Recent Labs  Lab 11/18/23 2341 11/19/23 0635 11/20/23 0421 11/21/23 0325 11/22/23 0622  NA 138 140 138 136 136  K 4.2 3.7 3.8 3.3* 3.4*  CL 101 105 107 101 102  CO2 22 24 25 25 27   GLUCOSE 370* 308* 222* 157* 121*  BUN 14 14 15 10 8   CREATININE 1.15* 1.08* 1.20* 0.89 0.92  CALCIUM  8.9  8.5* 8.4* 8.6* 8.4*   Liver Function Tests: Recent Labs  Lab 11/18/23 2341  AST 25  ALT 12  ALKPHOS 109  BILITOT 0.6  PROT 7.2  ALBUMIN 3.3*   CBG: Recent Labs  Lab 11/24/23 1558 11/24/23 2026 11/25/23 0044 11/25/23 0413 11/25/23 0737  GLUCAP 259* 294* 298* 175* 134*    Discharge time spent:  37 minutes.  Signed: Drue ONEIDA Potter, MD [facility] 11/25/2023

## 2023-11-25 NOTE — Progress Notes (Signed)
 Inpatient Rehabilitation Admission Medication Review by a Pharmacist  A complete drug regimen review was completed for this patient to identify any potential clinically significant medication issues.  High Risk Drug Classes Is patient taking? Indication by Medication  Antipsychotic No   Anticoagulant Yes Lovenox : VTE ppx  Antibiotic No   Opioid Yes Oxycodone , tramadol : pain  Antiplatelet Yes Aspirin , Plavix : CVA ppx  Hypoglycemics/insulin  Yes Insulin : DM  Vasoactive Medication Yes Amlodipine , Coreg , Lisinopril : HTN  Chemotherapy No   Other Yes Lipitor: HLD Tylenol : pain Bupropion : mood Cal/vitD, B12, MVI: supplement Gabapentin , voltaren  gel, robaxin : pain Trazodone : sleep Nicoderm: smoking cessation Docusate, Lactulose : constipation Zofran : N/V      Type of Medication Issue Identified Description of Issue Recommendation(s)  Drug Interaction(s) (clinically significant)     Duplicate Therapy     Allergy     No Medication Administration End Date     Incorrect Dose     Additional Drug Therapy Needed  Metformin , Trulicity> f/u restart Restart PTA meds as able/needed  Significant med changes from prior encounter (inform family/care partners about these prior to discharge). D/c: Lyrica , meloxicam, tramadol  Restart or discontinue as appropriate. Communicate medication changes with patient/family at discharge  Other       Clinically significant medication issues were identified that warrant physician communication and completion of prescribed/recommended actions by midnight of the next day:  No  Time spent performing this drug regimen review (minutes): 45  Thank you for allowing pharmacy to be a part of this patient's care.   Bascom JAYSON Louder, PharmD 11/25/2023 2:09 PM   **Pharmacist phone directory can be found on [email or web address] listed under [facility]**

## 2023-11-25 NOTE — Progress Notes (Signed)
 Received report Ronal, RN and transfer from [facility] to [phone] IP Rehab, a/ox4, vitals stable.

## 2023-11-25 NOTE — Progress Notes (Addendum)
 Cornelio Bouchard, MD  Physician Physical Medicine and Rehabilitation   PMR Pre-admission     Signed   Date of Service: 11/25/2023 10:31 AM  Related encounter: ED to Hosp-Admission (Discharged) from 11/18/2023 in [facility] (1A)   Signed     Expand All Collapse All  PMR Admission Coordinator Pre-Admission Assessment   Patient: [name] is an 73 y.o., female MRN: [id number] DOB: 1951-03-05 Height: 5' 4 (162.6 cm) Weight: 69.9 kg   Insurance Information HMO: yes    PPO:      PCP:      IPA:      80/20:      OTHER:  PRIMARY: [employer] Medicare      Policy#: [id number]       Subscriber: pt CM Name: Erminio      Phone#: [phone]     Fax#: [phone] Pre-Cert#: [id number] auth for CIR from [place] with [facility] and [facility] for admit 9/2 through 9/8.  Updates due to fax listed above.        Employer:  Benefits:  Phone #: [phone]     Name:  Eff. Date: 10/24/23     Deduct: $0      Out of Pocket Max: [phone] (met $290)      Life Max:  CIR: $435/day for days 1-4      SNF: 20 full days, then $203/day Outpatient:      Co-Pay: $30/visit Home Health: 100%      Co-Pay:  DME: 80%     Co-Pay: 20% Providers:  SECONDARY:       Policy#:      Phone#:    Artist:       Phone#:     The Engineer, materials Information Summary" for patients in Inpatient Rehabilitation Facilities with attached "Privacy Act Statement-Health Care Records" was provided and verbally reviewed with: Patient and Family   Emergency Contact Information Contact Information       Name Relation Home Work Mobile    Franzel,Curtis Jama Johann [phone]   [phone]         Other Contacts   None on File        Current Medical History  Patient Admitting Diagnosis:  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  In ED BP 157/73, with hyperglycemia, albumin 3.3, and creatinine 1.15.  CT revealed acute nondisplaced intertrochanteric fx of the proximal L femur.  Orthopedics was consulted and recommended operative management, and pt underwent IMN of L femur fx per Dr. Lorelle on 8/27.  Given nature of fall MRI was ordered in ED and revealed an acute infarct in the posterior limb of the right internal capsule as well as a punctate infarct in the left middle cerebellar peduncle.  Neurology consulted and she endorsed unsteadiness x2 weeks.  CTA head/neck negative for LVO.  Echo revealed 35-40% EF.  Recommendations for DAPT indefinitely.  Therapy ongoing and pt was recommended for CIR.    Complete NIHSS TOTAL: 2   Patient's medical record from [facility] has been reviewed by the rehabilitation admission coordinator and physician.   Past Medical History      Past Medical History:  Diagnosis Date   Diabetes mellitus without complication (HCC)  Has the patient had major surgery during 100 days prior to admission? Yes   Family History   family history is not on file.   Current Medications  Current Medications    Current Facility-Administered Medications:    0.9 %  sodium chloride  infusion, , Intravenous, Continuous, Ward, Josette SAILOR, DO, Last Rate: 75 mL/hr at 11/21/23 1229, New Bag at 11/21/23 1229   acetaminophen  (TYLENOL ) tablet 325-650 mg, 325-650 mg, Oral, Q6H PRN, Aberman, Zachary, MD, 650 mg at 11/21/23 0413   aspirin  EC tablet 81 mg, 81 mg, Oral, Daily, Djan, Drue DASEN, MD, 81 mg at 11/21/23 9145   atorvastatin  (LIPITOR) tablet 40 mg, 40 mg, Oral, Daily, Lindzen, Eric, MD, 40 mg at 11/21/23 9145   buPROPion  (WELLBUTRIN  SR) 12 hr tablet 150 mg, 150 mg, Oral, BID, Mansy, Jan A, MD, 150 mg at 11/21/23 9144   calcium -vitamin D (OSCAL WITH D) 500-5 MG-MCG per tablet 1 tablet, 1 tablet, Oral, Daily, Mansy, Jan A, MD, 1 tablet at 11/21/23 9145   carvedilol  (COREG )  tablet 25 mg, 25 mg, Oral, BID WC, Mansy, Jan A, MD, 25 mg at 11/21/23 9145   clopidogrel  (PLAVIX ) tablet 75 mg, 75 mg, Oral, Daily, Djan, Prince T, MD, 75 mg at 11/21/23 9145   cyanocobalamin  (VITAMIN B12) tablet 1,000 mcg, 1,000 mcg, Oral, Daily, Mansy, Jan A, MD, 1,000 mcg at 11/21/23 9145   docusate sodium  (COLACE) capsule 100 mg, 100 mg, Oral, BID, Aberman, Zachary, MD, 100 mg at 11/21/23 0854   enoxaparin  (LOVENOX ) injection 40 mg, 40 mg, Subcutaneous, Q24H, Aberman, Zachary, MD, 40 mg at 11/21/23 0856   feeding supplement (GLUCERNA SHAKE) (GLUCERNA SHAKE) liquid 237 mL, 237 mL, Oral, TID BM, Djan, Drue DASEN, MD   gabapentin  (NEURONTIN ) capsule 800 mg, 800 mg, Oral, TID, Mansy, Jan A, MD, 800 mg at 11/21/23 9145   insulin  aspart (novoLOG ) injection 0-9 Units, 0-9 Units, Subcutaneous, Q4H, Mansy, Jan A, MD, 3 Units at 11/21/23 1226   insulin  glargine (LANTUS ) injection 15 Units, 15 Units, Subcutaneous, Q24H, Merrill, Kristin A, RPH, 15 Units at 11/21/23 9146   lactulose  (CHRONULAC ) 10 GM/15ML solution 30 g, 30 g, Oral, BID PRN, Djan, Prince T, MD, 30 g at 11/21/23 1044   lisinopril  (ZESTRIL ) tablet 40 mg, 40 mg, Oral, Daily, Mansy, Jan A, MD, 40 mg at 11/21/23 9145   menthol -cetylpyridinium (CEPACOL) lozenge 3 mg, 1 lozenge, Oral, PRN **OR** phenol (CHLORASEPTIC) mouth spray 1 spray, 1 spray, Mouth/Throat, PRN, Aberman, Zachary, MD   metoCLOPramide  (REGLAN ) tablet 5-10 mg, 5-10 mg, Oral, Q8H PRN **OR** metoCLOPramide  (REGLAN ) injection 5-10 mg, 5-10 mg, Intravenous, Q8H PRN, Aberman, Zachary, MD   morphine  (PF) 2 MG/ML injection 0.5-1 mg, 0.5-1 mg, Intravenous, Q2H PRN, Aberman, Zachary, MD, 1 mg at 11/21/23 1321   morphine  (PF) 2 MG/ML injection 2 mg, 2 mg, Intravenous, Q4H PRN, Mansy, Jan A, MD, 2 mg at 11/21/23 0422   multivitamin with minerals tablet 1 tablet, 1 tablet, Oral, Daily, Djan, Drue DASEN, MD, 1 tablet at 11/21/23 9145   nicotine  (NICODERM CQ  - dosed in mg/24 hours) patch 21 mg, 21  mg, Transdermal, Daily, Djan, Drue T, MD, 21 mg at 11/21/23 0908   ondansetron  (ZOFRAN ) tablet 4 mg, 4 mg, Oral, Q6H PRN **OR** ondansetron  (ZOFRAN ) injection 4 mg, 4 mg, Intravenous, Q6H PRN, Lorelle Hussar, MD   Oral care mouth rinse, 15 mL, Mouth Rinse, PRN, Mansy, Jan A, MD   oxyCODONE  (Oxy IR/ROXICODONE ) immediate release tablet 5 mg, 5 mg, Oral, Q4H  PRN, Aberman, Zachary, MD, 5 mg at 11/21/23 1136   traMADol  (ULTRAM ) tablet 50 mg, 50 mg, Oral, Q6H PRN, Aberman, Zachary, MD, 50 mg at 11/20/23 1616   traZODone  (DESYREL ) tablet 25 mg, 25 mg, Oral, QHS PRN, Mansy, Madison LABOR, MD     Patients Current Diet:  Diet Order                  Diet Carb Modified Fluid consistency: Thin; Room service appropriate? Yes  Diet effective now                         Precautions / Restrictions Precautions Precautions: Fall Restrictions Weight Bearing Restrictions Per Provider Order: Yes LLE Weight Bearing Per Provider Order: Weight bearing as tolerated    Has the patient had 2 or more falls or a fall with injury in the past year? Yes   Prior Activity Level Household: household ambulator with RW, doesn't drive, mod I with mobility/ADLs   Prior Functional Level Self Care: Did the patient need help bathing, dressing, using the toilet or eating? Independent   Indoor Mobility: Did the patient need assistance with walking from room to room (with or without device)? Independent   Stairs: Did the patient need assistance with internal or external stairs (with or without device)? Independent   Functional Cognition: Did the patient need help planning regular tasks such as shopping or remembering to take medications? Independent   Patient Information Are you of Hispanic, Latino/a,or Spanish origin?: A. No, not of Hispanic, Latino/a, or Spanish origin What is your race?: B. Black or African American Do you need or want an interpreter to communicate with a doctor or health care staff?: 0. No    Patient's Response To:  Health Literacy and Transportation Is the patient able to respond to health literacy and transportation needs?: Yes Health Literacy - How often do you need to have someone help you when you read instructions, pamphlets, or other written material from your doctor or pharmacy?: Never In the past 12 months, has lack of transportation kept you from medical appointments or from getting medications?: No In the past 12 months, has lack of transportation kept you from meetings, work, or from getting things needed for daily living?: No   Journalist, newspaper / Equipment Home Equipment: Shower seat, Medical laboratory scientific officer - single point, Hand held shower head   Prior Device Use: Indicate devices/aids used by the patient prior to current illness, exacerbation or injury? Walker   Current Functional Level Cognition   Orientation Level: Oriented X4    Extremity Assessment (includes Sensation/Coordination)   Upper Extremity Assessment: Generalized weakness, Right hand dominant, LUE deficits/detail LUE Deficits / Details: limited ROM at shoulder and notable weakness 2+ at L shoulder, 3+ at elbow and good grip strength; full AAROM, minimal AROM at shoulder LUE Coordination: decreased fine motor  Lower Extremity Assessment: Generalized weakness, LLE deficits/detail LLE Deficits / Details: able to wiggle toes; 0/10 L DF; poor L quad isometric strength; at least 2+/5 L hip flexion and knee flexion/extension LLE: Unable to fully assess due to pain LLE Sensation: decreased light touch LLE Coordination: decreased gross motor, decreased fine motor (limited assessment d/t L LE weakness and pain)     ADLs   Overall ADL's : Needs assistance/impaired Eating/Feeding: Modified independent Eating/Feeding Details (indicate cue type and reason): w/ encouragement from therapist, pt able to use L UE for feeding, w/ increased time/effort Grooming: Oral care, Sitting, Set up, Wash/dry face  Grooming Details  (indicate cue type and reason): able to perform oral care and wash her face using BUEs seated in recliner with set up assist only     Mobility   Overal bed mobility: Needs Assistance Bed Mobility: Supine to Sit Supine to sit: Max assist, HOB elevated, Used rails Sit to supine: Max assist, Used rails General bed mobility comments: required rest breaks during supine<>sit transfers, Max A for BLE management     Transfers   Overall transfer level: Needs assistance Equipment used: Rolling walker (2 wheels) Transfers: Sit to/from Stand Sit to Stand: Max assist, From elevated surface Bed to/from chair/wheelchair/BSC transfer type:: Step pivot Step pivot transfers: Mod assist, +2 physical assistance General transfer comment: Max A w/ RW, verbal and tactile cueing for hand placement on RW, unable to come fully into standing     Ambulation / Gait / Stairs / Wheelchair Mobility   Ambulation/Gait General Gait Details: Deferred d/t safety concerns     Posture / Balance Dynamic Sitting Balance Sitting balance - Comments: pt with significant R lean in recliner to off load weight from L hip Balance Overall balance assessment: Needs assistance Sitting-balance support: Feet supported, Bilateral upper extremity supported Sitting balance-Leahy Scale: Fair Sitting balance - Comments: pt with significant R lean in recliner to off load weight from L hip Postural control: Right lateral lean Standing balance support: Bilateral upper extremity supported, Reliant on assistive device for balance Standing balance-Leahy Scale: Poor Standing balance comment: Max A to maintain standing balance with hunched posture     Special considerations/life events  Skin surgical incision to L hip and Diabetic management yes    Previous Home Environment (from acute therapy documentation) Living Arrangements: Children, Other relatives (Pt's brother; pt's son; and pt's mother) Available Help at Discharge: Family, Available  PRN/intermittently Type of Home: House Home Layout: One level Home Access: Ramped entrance Bathroom Shower/Tub: Engineer, manufacturing systems: Handicapped height Home Care Services: No Additional Comments: Pt has been using her mom's w/c in community at times.  Pt's brother is in/out during the day; pt's son works 10:30 am to 9 pm.  Pt (and pt's family) assist with caregiving for her mother.   Discharge Living Setting Plans for Discharge Living Setting: Patient's home, Lives with (comment) (spouse) Type of Home at Discharge: House Discharge Home Layout: One level Discharge Home Access: Ramped entrance Discharge Bathroom Shower/Tub: Tub/shower unit Discharge Bathroom Toilet: Handicapped height Discharge Bathroom Accessibility: Yes How Accessible: Accessible via walker Does the patient have any problems obtaining your medications?: No   Social/Family/Support Systems Patient Roles: Spouse Anticipated Caregiver: Pearlina Friedly Anticipated Caregiver's Contact Information: [phone] Ability/Limitations of Caregiver: works 3rd shift Caregiver Availability: Other (Comment) Discharge Plan Discussed with Primary Caregiver: Yes Is Caregiver In Agreement with Plan?: Yes Does Caregiver/Family have Issues with Lodging/Transportation while Pt is in Rehab?: No   Goals Patient/Family Goal for Rehab: PT/OT/SLP supervision to mod I Expected length of stay: 13-15 days Additional Information: Discharge plan: return to pt's home, which is accessible.  Spouse works 3rd shift Pt/Family Agrees to Admission and willing to participate: Yes Program Orientation Provided & Reviewed with Pt/Caregiver Including Roles  & Responsibilities: Yes   Decrease burden of Care through IP rehab admission: n/a   Possible need for SNF placement upon discharge:  Not anticipated.  Plan for discharge home with support from spouse who works 3rd shift.  If pt does not progress to supervision level may require short SNF  stay for continued rehab.  Patient Condition: I have reviewed medical records from [facility], spoken with [facility] team, and patient. I discussed via phone for inpatient rehabilitation assessment.  Patient will benefit from ongoing PT, OT, and SLP, can actively participate in 3 hours of therapy a day 5 days of the week, and can make measurable gains during the admission.  Patient will also benefit from the coordinated team approach during an Inpatient Acute Rehabilitation admission.  The patient will receive intensive therapy as well as Rehabilitation physician, nursing, social worker, and care management interventions.  Due to bladder management, safety, skin/wound care, disease management, medication administration, pain management, and patient education the patient requires 24 hour a day rehabilitation nursing.  The patient is currently min to mod assist with mobility and basic ADLs.  Discharge setting and therapy post discharge at home with home health is anticipated.  Patient has agreed to participate in the Acute Inpatient Rehabilitation Program and will admit today.   Preadmission Screen Completed By:  Reche FORBES Lowers, PT, DPT 11/21/2023 2:01 PM ______________________________________________________________________   Discussed status with Dr. Lovorn on 11/25/23  at 10:31 AM  and received approval for admission today.   Admission Coordinator:  Pricsilla Lindvall E Orel Cooler, PT, time 10:31 AM/Date 11/25/23     Assessment/Plan: Diagnosis: R internal capsule stroke Does the need for close, 24 hr/day Medical supervision in concert with the patient's rehab needs make it unreasonable for this patient to be served in a less intensive setting? Yes Co-Morbidities requiring supervision/potential complications: L hip fx s/p IM nail, DM A1c 12.4; HTN, HLD; intracranial aneurysm- delcined intervention - is DNR Due to bladder management, bowel management, safety, skin/wound care, disease management, medication administration, pain  management, and patient education, does the patient require 24 hr/day rehab nursing? Yes Does the patient require coordinated care of a physician, rehab nurse, PT, OT, and SLP to address physical and functional deficits in the context of the above medical diagnosis(es)? Yes Addressing deficits in the following areas: balance, endurance, locomotion, strength, transferring, bowel/bladder control, bathing, dressing, feeding, grooming, toileting, cognition, speech, language, and swallowing Can the patient actively participate in an intensive therapy program of at least 3 hrs of therapy 5 days a week? Yes The potential for patient to make measurable gains while on inpatient rehab is good Anticipated functional outcomes upon discharge from inpatient rehab: modified independent and supervision PT, modified independent and supervision OT, modified independent and supervision SLP Estimated rehab length of stay to reach the above functional goals is: 13-15 days Anticipated discharge destination: Home 10. Overall Rehab/Functional Prognosis: good     MD Signature:            Revision History

## 2023-11-25 NOTE — Discharge Instructions (Signed)
 Inpatient Rehab Discharge Instructions  [name] Discharge date and time: No discharge date for patient encounter.   Activities/Precautions/ Functional Status: Activity: activity as tolerated Diet: diabetic diet Wound Care: Routine skin checks Functional status:  ___ No restrictions     ___ Walk up steps independently ___ 24/7 supervision/assistance   ___ Walk up steps with assistance ___ Intermittent supervision/assistance  ___ Bathe/dress independently ___ Walk with walker     _x__ Bathe/dress with assistance ___ Walk Independently    ___ Shower independently ___ Walk with assistance    ___ Shower with assistance ___ No alcohol     ___ Return to work/school ________  Special Instructions: No driving smoking or alcoholSTROKE/TIA DISCHARGE INSTRUCTIONS SMOKING Cigarette smoking nearly doubles your risk of having a stroke & is the single most alterable risk factor  If you smoke or have smoked in the last 12 months, you are advised to quit smoking for your health. Most of the excess cardiovascular risk related to smoking disappears within a year of stopping. Ask you doctor about anti-smoking medications [place] Quit Line: 1-800-QUIT NOW Free Smoking Cessation Classes [phone]  CHOLESTEROL Know your levels; limit fat & cholesterol in your diet  Lipid Panel  No results found for: CHOL, TRIG, HDL, CHOLHDL, VLDL, LDLCALC   Many patients benefit from treatment even if their cholesterol is at goal. Goal: Total Cholesterol (CHOL) less than 160 Goal:  Triglycerides (TRIG) less than 150 Goal:  HDL greater than 40 Goal:  LDL (LDLCALC) less than 100   BLOOD PRESSURE American Stroke Association blood pressure target is less that 120/80 mm/Hg  Your discharge blood pressure is:    Monitor your blood pressure Limit your salt and alcohol intake Many individuals will require more than one medication for high blood pressure  DIABETES (A1c is a blood sugar average for last 3  months) Goal HGBA1c is under 7% (HBGA1c is blood sugar average for last 3 months)  Diabetes:     Lab Results  Component Value Date   HGBA1C 12.4 (H) 11/19/2023    Your HGBA1c can be lowered with medications, healthy diet, and exercise. Check your blood sugar as directed by your physician Call your physician if you experience unexplained or low blood sugars.  PHYSICAL ACTIVITY/REHABILITATION Goal is 30 minutes at least 4 days per week  Activity: Increase activity slowly, Therapies: Physical Therapy: Home Health Return to work:  Activity decreases your risk of heart attack and stroke and makes your heart stronger.  It helps control your weight and blood pressure; helps you relax and can improve your mood. Participate in a regular exercise program. Talk with your doctor about the best form of exercise for you (dancing, walking, swimming, cycling).  DIET/WEIGHT Goal is to maintain a healthy weight  Your discharge diet is:  Diet Order             Diet Carb Modified Fluid consistency: Thin; Room service appropriate? Yes  Diet effective now                   liquids Your height is:    Your current weight is:   Your Body Mass Index (BMI) is:    Following the type of diet specifically designed for you will help prevent another stroke. Your goal weight range is:   Your goal Body Mass Index (BMI) is 19-24. Healthy food habits can help reduce 3 risk factors for stroke:  High cholesterol, hypertension, and excess weight.  RESOURCES Stroke/Support Group:  Call [phone]   STROKE EDUCATION PROVIDED/REVIEWED AND GIVEN TO PATIENT Stroke warning signs and symptoms How to activate emergency medical system (call 911). Medications prescribed at discharge. Need for follow-up after discharge. Personal risk factors for stroke. Pneumonia vaccine given: No Flu vaccine given: No My questions have been answered, the writing is legible, and I understand these instructions.  I will adhere to these  goals & educational materials that have been provided to me after my discharge from the hospital.      My questions have been answered and I understand these instructions. I will adhere to these goals and the provided educational materials after my discharge from the hospital.  Patient/Caregiver Signature _______________________________ Date __________  Clinician Signature _______________________________________ Date __________  Please bring this form and your medication list with you to all your follow-up doctor's appointments.

## 2023-11-25 NOTE — H&P (Signed)
 Physical Medicine and Rehabilitation Admission H&P        Chief Complaint  Patient presents with   Fall  : HPI: [name] is a 73 year old right-handed female with history significant for diabetes mellitus- A1c now 12.4 , CKD stage IIIb, hypertension, hyperlipidemia intracranial aneurysm for which she declined intervention 2023 at [facility], tobacco use.  Per chart review patient lives with brother and other family members.  1 level home with ramped entrance.  Reportedly modified independent at baseline for ADLs ambulating with a rolling walker household distances.  Presented to [facility] 11/19/2023 with acute onset of vertigo and subsequent fall while washing her dishes with reports of left hip pain.  She was unable to get up after the fall.  Admission chemistries unremarkable except glucose 370, creatinine 1.15, troponin 19-18, hemoglobin A1c 12.4, EKG normal sinus rhythm.  Cranial CT scan showed generalized cerebral atrophy with microvascular disease changes of the supratentorial brain.  Chronic left basal ganglia lacunar infarct.  No acute changes.  CT cervical spine negative.  CT of the left hip showed acute nondisplaced intertrochanteric fracture of the proximal left femur.  MRI showed acute infarct in the posterior limb of the right internal capsule superiorly.  Possible punctate acute infarct in the left middle cerebellar peduncle versus artifact.  Remote lacunar infarct in the pons, left basal ganglia and right corona radiata.  Patient did not receive TNK.  Echocardiogram with ejection fraction of 35 to 40%.  Grade 1 diastolic dysfunction.  The left ventricle demonstrating global hypokinesis.  Patient was cleared for surgical intervention and underwent left hip intramedullary nail 11/19/2023 per Dr. Arthea Sheer.  Weightbearing as tolerated left lower extremity.  Acute blood loss anemia 10.3 and monitored.  Neurology follow-up in regards to CVA maintained on aspirin  and Plavix  indefinitely.   Patient maintained on Lovenox  for DVT prophylaxis.  Tolerating a regular consistency diet.  Therapy evaluations completed due to patient's decreased functional mobility was admitted for a comprehensive rehab program.     Pt reports she's constantly leaking urine even with using Purewick, but has no urge to pee- doesn't even feels when going - so I had her  bladder scanned when I was in room- was scanned for 484cc- as well as pt c/o constipation- LBM 4-5 days ago. Feels bloated.   Asked nursing to cath using lidocaine  jelly. Cathed for 550cc.   Pt reports her L hip is painful s/p Hip IM nail- but not bad enough to take meds as long as I stay still. Explained the definition of rehab is moving, so will need to ask for pain meds BEFORE therapy.    Also c/o dry thick tongue- food doesn't taste the same- just not right- and so dry, hard to talk.    Pt also admits to dry eyes, but water all the time-  has been for at least 2-4 weeks.          Review of Systems  Constitutional:  Negative for chills and fever.  HENT:  Negative for hearing loss.   Eyes:  Negative for blurred vision and double vision.  Respiratory:  Negative for cough, shortness of breath and wheezing.   Cardiovascular:  Negative for chest pain, palpitations and leg swelling.  Gastrointestinal:  Positive for constipation. Negative for heartburn, nausea and vomiting.  Genitourinary:  Negative for dysuria, flank pain and hematuria.  Musculoskeletal:  Positive for falls and myalgias.  Skin:  Negative for rash.  Neurological:  Positive for dizziness,  focal weakness and weakness.       Vertigo  All other systems reviewed and are negative.      Past Medical History:  Diagnosis Date   Diabetes mellitus without complication (HCC)               Past Surgical History:  Procedure Laterality Date   INTRAMEDULLARY (IM) NAIL INTERTROCHANTERIC Left 11/19/2023    Procedure: FIXATION, FRACTURE, INTERTROCHANTERIC, WITH  INTRAMEDULLARY ROD;  Surgeon: Lorelle Hussar, MD;  Location: [facility] ORS;  Service: Orthopedics;  Laterality: Left;   IR ANGIO INTRA EXTRACRAN SEL INTERNAL CAROTID BILAT MOD SED   07/05/2021   IR ANGIO VERTEBRAL SEL VERTEBRAL UNI R MOD SED   07/05/2021   IR RADIOLOGIST EVAL & MGMT   07/16/2021   IR US  GUIDE VASC ACCESS RIGHT   07/05/2021        History reviewed. No pertinent family history.     Social History:  reports that she has been smoking cigarettes. She started smoking about 50 years ago. She has a 25.3 pack-year smoking history. She has never been exposed to tobacco smoke. She has never used smokeless tobacco. She reports that she does not currently use alcohol. She reports that she does not use drugs. Allergies:  Allergies       Allergies  Allergen Reactions   Honey Bee Venom Anaphylaxis      Pt states she felt like she was on fire and required oxygen   Hydrocodone  Hives   Mercury Hives   Phenylmercuric Nitrate Other (See Comments)      BLISTERS            Medications Prior to Admission  Medication Sig Dispense Refill   Calcium  Carb-Cholecalciferol  (CALCIUM  + VITAMIN D3 PO) Take 1 tablet by mouth daily.       gabapentin  (NEURONTIN ) 800 MG tablet Take 800 mg by mouth 3 (three) times daily.       metFORMIN  (GLUCOPHAGE -XR) 500 MG 24 hr tablet Take 500 mg by mouth daily with breakfast.       vitamin B-12 (CYANOCOBALAMIN ) 1000 MCG tablet Take 1,000 mcg by mouth daily.       amLODipine  (NORVASC ) 5 MG tablet Take 5 mg by mouth daily. (Patient not taking: Reported on 11/19/2023)       aspirin  EC 81 MG tablet Take 81 mg by mouth daily. Swallow whole. (Patient not taking: Reported on 11/19/2023)       atorvastatin  (LIPITOR) 10 MG tablet Take 10 mg by mouth daily. (Patient not taking: Reported on 11/19/2023)       buPROPion  (ZYBAN ) 150 MG 12 hr tablet Take 150 mg by mouth 2 (two) times daily. (Patient not taking: Reported on 11/19/2023)       carvedilol  (COREG ) 25 MG tablet Take 25 mg by  mouth 2 (two) times daily with a meal. (Patient not taking: Reported on 11/19/2023)       insulin  degludec (TRESIBA ) 100 UNIT/ML FlexTouch Pen Inject 40 Units into the skin daily. (Patient not taking: Reported on 11/19/2023) 3 mL 2   lisinopril  (ZESTRIL ) 40 MG tablet Take 40 mg by mouth daily. (Patient not taking: Reported on 11/19/2023)       LYRICA  100 MG capsule Take 100 mg by mouth 2 (two) times daily. (Patient not taking: Reported on 11/19/2023)       meloxicam (MOBIC) 15 MG tablet Take 15 mg by mouth daily. (Patient not taking: Reported on 11/19/2023)       traMADol  (ULTRAM ) 50 MG  tablet Take 1 tablet (50 mg total) by mouth every 12 (twelve) hours as needed. (Patient not taking: Reported on 11/19/2023) 10 tablet 0   TRULICITY 0.75 MG/0.5ML SOPN Inject 0.75 mg into the skin once a week. (Patient not taking: Reported on 11/19/2023)                  Home: Home Living Family/patient expects to be discharged to:: Private residence Living Arrangements: Children, Other relatives (Pt's brother; pt's son; and pt's mother) Available Help at Discharge: Family, Available PRN/intermittently Type of Home: House Home Access: Ramped entrance Home Layout: One level Bathroom Shower/Tub: Engineer, manufacturing systems: Handicapped height Home Equipment: Shower seat, Medical laboratory scientific officer - single point, Hand held shower head Additional Comments: Pt has been using her mom's w/c in community at times.  Pt's brother is in/out during the day; pt's son works 10:30 am to 9 pm.  Pt (and pt's family) assist with caregiving for her mother.   Functional History: Prior Function Prior Level of Function : Needs assist Mobility Comments: Modified independent ambulating with RW household distances.  H/o falls. ADLs Comments: reports being IND with ADLs, walking to the bathroom and has shower chair   Functional Status:  Mobility: Bed Mobility Overal bed mobility: Needs Assistance Bed Mobility: Supine to Sit Supine to sit: Mod  assist Sit to supine: Max assist, Used rails General bed mobility comments: assist for L LE, scooting hips to edge of bed, and for trunk; vc's for use of bed rail and overall technique Transfers Overall transfer level: Needs assistance Equipment used: Left platform walker Transfers: Sit to/from Stand Sit to Stand: Max assist Bed to/from chair/wheelchair/BSC transfer type:: Squat pivot Squat pivot transfers: Max assist Step pivot transfers: Mod assist, +2 physical assistance General transfer comment: vc's for UE/LE positioning and overall technique; assist to come to full stand (vc's for upright posture) and assist to control descent sitting Ambulation/Gait Ambulation/Gait assistance: Mod assist Gait Distance (Feet): 2 Feet Assistive device: Left platform walker General Gait Details: antalgic; decreased stance time L LE; increased effort/time to take steps; pt taking small steps initially but then pivoting on R LE to turn towards recliner; vc's for technique Gait velocity: decreased   ADL: ADL Overall ADL's : Needs assistance/impaired Eating/Feeding: Modified independent Eating/Feeding Details (indicate cue type and reason): w/ encouragement from therapist, pt able to use L UE for feeding, w/ increased time/effort Grooming: Oral care, Sitting, Set up, Wash/dry face Grooming Details (indicate cue type and reason): able to perform oral care and wash her face using BUEs seated in recliner with set up assist only   Cognition: Cognition Orientation Level: Oriented X4 Cognition Arousal: Alert Behavior During Therapy: WFL for tasks assessed/performed   Physical Exam: Blood pressure 118/65, pulse 75, temperature 98.2 F (36.8 C), temperature source Oral, resp. rate 18, height 5' 4 (1.626 m), weight 69.9 kg, SpO2 94%. Physical Exam Vitals and nursing note reviewed. Exam conducted with a chaperone present.  Constitutional:      General: She is not in acute distress.    Appearance:  Normal appearance. She is obese.     Comments: Pt awake, alert, appropriate, sitting up in bed; c/o bloated/lack of peeing/but incontinent; NAD  HENT:     Head: Normocephalic and atraumatic.     Comments: Wearing dentures on top and bottom Eyes watering significantly- continually wiping eyes B/L Mild RIGHT sided facial droop (from prior stroke most likely) Tongue furrowed, very erythematous and almost beefy- no thrush plaques seen, but appears  c/w thrush initially    Right Ear: External ear normal.     Left Ear: External ear normal.     Nose: Nose normal. No congestion.     Mouth/Throat:     Mouth: Mucous membranes are dry.     Pharynx: Posterior oropharyngeal erythema present. No oropharyngeal exudate.  Eyes:     General:        Right eye: Discharge present.        Left eye: Discharge present.    Extraocular Movements: Extraocular movements intact.     Comments: EOM I B/L  Cardiovascular:     Rate and Rhythm: Normal rate and regular rhythm.     Heart sounds: Normal heart sounds. No murmur heard.    No gallop.  Pulmonary:     Effort: Pulmonary effort is normal. No respiratory distress.     Breath sounds: Normal breath sounds. No wheezing, rhonchi or rales.  Abdominal:     Comments: Appears very distended- more firm than expected- also distended in lower abdomen/pelvis above bladder and surrounding area Mildly TTP in entire abdomen Hypoactive BS!  Genitourinary:    Comments: Very distended palpable bladder Musculoskeletal:        General: Swelling present.     Cervical back: Neck supple. No tenderness.     Comments: RUE- 5-/5 throughout LUE- Deltoid, biceps, triceps, WE, grip and FA 4/5 with severely impaired sequencing- couldn't easily participate in strength exam RLE- 4+/5 throughout- alittle weaker than RUE LLE- limited by pain and weakness proximally- HF, KE/KF 2-/5 and DF 2-/5 and PF 2/5   Moderate swelling on L thigh esp laterally around incision  Skin:    General:  Skin is warm and dry.     Comments: Surgical dressing in place to left hip. Honeycomb dressing- no leakage outside dressing More wrinkled in hands than expected, like dry/(+) tenting  Neurological:     Mental Status: She is alert.     Comments: Patient is awake and alert sitting up in bed.  Mood is a bit flat but appropriate.  Follows simple commands.  Provides name but some delay and providing age and biographical information. Tangential- hard to keep on topic but able to answer questions appropriately Decreased to light touch on RLE entire leg  Hoffman's B/L, but 2-3 beats clonus RLE, not on LLE- likely from prior CVA's?   Psychiatric:     Comments: Bright, pleasant, interactive       Lab Results Last 48 Hours        Results for orders placed or performed during the hospital encounter of 11/18/23 (from the past 48 hours)  Glucose, capillary     Status: Abnormal    Collection Time: 11/23/23  8:04 AM  Result Value Ref Range    Glucose-Capillary 131 (H) 70 - 99 mg/dL      Comment: Glucose reference range applies only to samples taken after fasting for at least 8 hours.  Glucose, capillary     Status: Abnormal    Collection Time: 11/23/23 11:42 AM  Result Value Ref Range    Glucose-Capillary 285 (H) 70 - 99 mg/dL      Comment: Glucose reference range applies only to samples taken after fasting for at least 8 hours.  Glucose, capillary     Status: Abnormal    Collection Time: 11/23/23  5:01 PM  Result Value Ref Range    Glucose-Capillary 318 (H) 70 - 99 mg/dL      Comment: Glucose reference range  applies only to samples taken after fasting for at least 8 hours.  Glucose, capillary     Status: Abnormal    Collection Time: 11/23/23  7:52 PM  Result Value Ref Range    Glucose-Capillary 367 (H) 70 - 99 mg/dL      Comment: Glucose reference range applies only to samples taken after fasting for at least 8 hours.  Glucose, capillary     Status: Abnormal    Collection Time: 11/23/23  11:58 PM  Result Value Ref Range    Glucose-Capillary 184 (H) 70 - 99 mg/dL      Comment: Glucose reference range applies only to samples taken after fasting for at least 8 hours.  Glucose, capillary     Status: Abnormal    Collection Time: 11/24/23  3:37 AM  Result Value Ref Range    Glucose-Capillary 149 (H) 70 - 99 mg/dL      Comment: Glucose reference range applies only to samples taken after fasting for at least 8 hours.  Glucose, capillary     Status: Abnormal    Collection Time: 11/24/23  8:06 AM  Result Value Ref Range    Glucose-Capillary 162 (H) 70 - 99 mg/dL      Comment: Glucose reference range applies only to samples taken after fasting for at least 8 hours.  Glucose, capillary     Status: Abnormal    Collection Time: 11/24/23 11:56 AM  Result Value Ref Range    Glucose-Capillary 234 (H) 70 - 99 mg/dL      Comment: Glucose reference range applies only to samples taken after fasting for at least 8 hours.  Glucose, capillary     Status: Abnormal    Collection Time: 11/24/23  3:58 PM  Result Value Ref Range    Glucose-Capillary 259 (H) 70 - 99 mg/dL      Comment: Glucose reference range applies only to samples taken after fasting for at least 8 hours.  Glucose, capillary     Status: Abnormal    Collection Time: 11/24/23  8:26 PM  Result Value Ref Range    Glucose-Capillary 294 (H) 70 - 99 mg/dL      Comment: Glucose reference range applies only to samples taken after fasting for at least 8 hours.    Comment 1 Notify RN    Glucose, capillary     Status: Abnormal    Collection Time: 11/25/23 12:44 AM  Result Value Ref Range    Glucose-Capillary 298 (H) 70 - 99 mg/dL      Comment: Glucose reference range applies only to samples taken after fasting for at least 8 hours.    Comment 1 Notify RN    Glucose, capillary     Status: Abnormal    Collection Time: 11/25/23  4:13 AM  Result Value Ref Range    Glucose-Capillary 175 (H) 70 - 99 mg/dL      Comment: Glucose reference  range applies only to samples taken after fasting for at least 8 hours.    Comment 1 Notify RN        Imaging Results (Last 48 hours)  No results found.         Blood pressure 118/65, pulse 75, temperature 98.2 F (36.8 C), temperature source Oral, resp. rate 18, height 5' 4 (1.626 m), weight 69.9 kg, SpO2 94%.   Medical Problem List and Plan: 1. 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  Possible punctate infarct left middle cerebellar peduncle  versus artifact.  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             -patient may  shower-cover L hip dressing with occlusive dressing             -ELOS/Goals: 13-15 days- mod I to supervision             Admit to CIR 2.  Antithrombotics: -DVT/anticoagulation:  Pharmaceutical: Lovenox .  Check vascular study             -antiplatelet therapy: Aspirin  81 mg daily and Plavix  75 mg daily 3. Pain Management: Neurontin  800 mg 3 times daily, Voltaren  gel 4 g 4 times daily to affected area, oxycodone  5 mg every 4 hours as needed, tramadol  50 mg every 6 hours as needed, Robaxin  500 mg every 8 hours as needed- of note, doesn't like to take meds- encouraged her to do so prior to therapy 4. Mood/Behavior/Sleep: Wellbutrin  150 mg twice daily, trazodone  25 mg nightly as needed             -antipsychotic agents: N/A 5. Neuropsych/cognition: This patient is capable of making decisions on her own behalf. 6. Skin/Wound Care: Routine skin checks 7. Fluids/Electrolytes/Nutrition: Routine in and outs with follow-up chemistries 8.  Left nondisplaced intertrochanteric hip fracture of the proximal left femur.  Status post IM nailing 11/19/2023.  Weightbearing as tolerated 9.  Acute blood loss anemia.  Follow-up CBC 10.  Hypertension.  Norvasc  10 mg daily, Coreg  25 mg twice daily, lisinopril  40 mg daily.  Monitor  with increased mobility 11.  Diabetes mellitus.  Hemoglobin A1c 12.4.  Currently on Lantus  insulin  15 units every 24 hours.  Check blood sugars AC and at bedtime.  Prior to admission patient on Glucophage  500 mg daily, Trulicity weekly 12.  AKI on CKD stage IIIB.  Follow-up chemistries 13.  Tobacco abuse.  NicoDerm patch.  Provide counseling 14.  Hyperlipidemia.  Lipitor 15.  Constipation.  Adjust bowel program as needed- gave Sorbitol  30cc since lBM 4-5 days ago- if doesn't have good results, suggest Soap Suds enema.  16. Urinary retention with overflow- ordered bladder scans and to cath q6 hours prn if bladder scan >350cc- also ordered U/A and Cx, and Flomax  0.4 mg q supper -at this time U/A is in process- I ordered in/out caths if volumes >350cc myself  17. Mild spasticity- monitor for symptoms 18. Of note, pt's mother is 9 years old and still doing functionally well! 19. Thrush- ordered nystatin  5ml q6 hours for mild thrush- might need Diflucan- monitor     Toribio JINNY Pitch, PA-C 11/25/2023   I spent a total of 89   minutes on  total care personally today- >50% coordination of care- due to review of chart, d/w pt about B/B and taste issues; as well as spasticity and need for in/out caths- added lidocaine  jelly as well- also ordered labs, and reviewed current labs as well .      I have personally performed a face to face diagnostic evaluation of this patient and formulated the key components of the plan.  Additionally, I have personally reviewed laboratory data, imaging studies, as well as relevant notes and concur with the physician assistant's documentation above.   The patient's status has not changed from the original H&P.  Any changes in documentation from the acute care chart have been noted above.

## 2023-11-25 NOTE — Progress Notes (Signed)
 Physical Therapy Treatment Patient Details Name: [name] MRN: [id number] DOB: [birth year] Today's Date: 11/25/2023   History of Present Illness Pt is a 73 y.o. female presenting to hospital 11/18/23 with c/o fall (pt with acute onset of vertigo with subsequent fall while washing disches); c/o L side weakness.  Imaging showing acute nondisplaced intertrochanteric fx of proximal L femur; chronic appearing partial tearing of R hamstring tendon.  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  S/p L hip intramedullary nail d/t L hip intertrochanteric fx 11/19/23.  PMH includes DM, htn, HLD, intracranial aneurysm.    PT Comments  Pt in bed, ready to get up.  Continues with pain LLE which limits AAROM  stated L knee has been painful and difficult to bed for quite a while before fall.  She is assisted to EOB with mod a x 1 and bed features.  Inc time given to allow her to assist as much as able.  She needs most help for LLE.  Upon sitting she is generally steady.  Seated AROM to tolerance.  She is able to stand from elevated bed to L platform walker and mod a x 2.  She is able to take several small shuffle steps to recliner at bedside with mod encouragement.  Max a to reposition in chair.     If plan is discharge home, recommend the following: Two people to help with walking and/or transfers;A lot of help with bathing/dressing/bathroom;Assistance with cooking/housework;Assist for transportation;Help with stairs or ramp for entrance   Can travel by private vehicle        Equipment Recommendations       Recommendations for Other Services       Precautions / Restrictions Precautions Precautions: Fall Recall of Precautions/Restrictions: Intact Restrictions Weight Bearing Restrictions Per Provider Order: Yes LLE Weight Bearing Per Provider Order: Weight bearing as tolerated      Mobility  Bed Mobility Overal bed mobility: Needs Assistance Bed Mobility: Supine to Sit     Supine to sit: Mod assist     General bed mobility comments: assist for L LE, scooting hips to edge of bed, and for trunk; vc's for use of bed rail and overall technique Patient Response: Cooperative  Transfers Overall transfer level: Needs assistance Equipment used: Left platform walker Transfers: Sit to/from Stand Sit to Stand: Max assist, From elevated surface   Step pivot transfers: Mod assist, +2 physical assistance, Min assist            Ambulation/Gait Ambulation/Gait assistance: Mod assist, Min assist, +2 safety/equipment Gait Distance (Feet): 2 Feet Assistive device: Left platform walker, Rolling walker (2 wheels) Gait Pattern/deviations: Step-to pattern Gait velocity: decreased     General Gait Details: difficulty with steps with max encouragement   Stairs             Wheelchair Mobility     Tilt Bed Tilt Bed Patient Response: Cooperative  Modified Rankin (Stroke Patients Only)       Balance Overall balance assessment: Needs assistance Sitting-balance support: No upper extremity supported, Feet supported Sitting balance-Leahy Scale: Good     Standing balance support: Bilateral upper extremity supported, Reliant on assistive device for balance Standing balance-Leahy Scale: Poor                              Communication Communication Communication:  No apparent difficulties  Cognition Arousal: Alert Behavior During Therapy: WFL for tasks assessed/performed   PT - Cognitive impairments: No apparent impairments                       PT - Cognition Comments: talkative and needs mod a to redirect during session Following commands: Impaired Following commands impaired: Follows one step commands with increased time    Cueing Cueing Techniques: Verbal cues, Visual cues  Exercises Other Exercises Other Exercises: supine  LLE and unsupported sitting BLE AROM.  general discomfort and fear of movement due to pain.    General Comments        Pertinent Vitals/Pain Pain Assessment Pain Assessment: Faces Faces Pain Scale: Hurts even more Pain Location: L hip pain Pain Descriptors / Indicators: Aching, Shooting Pain Intervention(s): Limited activity within patient's tolerance, Monitored during session, Premedicated before session, Repositioned    Home Living                          Prior Function            PT Goals (current goals can now be found in the care plan section) Progress towards PT goals: Progressing toward goals    Frequency    7X/week      PT Plan      Co-evaluation              AM-PAC PT 6 Clicks Mobility   Outcome Measure  Help needed turning from your back to your side while in a flat bed without using bedrails?: A Lot Help needed moving from lying on your back to sitting on the side of a flat bed without using bedrails?: A Lot Help needed moving to and from a bed to a chair (including a wheelchair)?: A Lot Help needed standing up from a chair using your arms (e.g., wheelchair or bedside chair)?: A Lot Help needed to walk in hospital room?: Total Help needed climbing 3-5 steps with a railing? : Total 6 Click Score: 10    End of Session Equipment Utilized During Treatment: Gait belt Activity Tolerance: Patient limited by pain;Patient limited by fatigue Patient left: in chair;with call bell/phone within reach;with chair alarm set;Other (comment) Nurse Communication: Mobility status;Precautions;Weight bearing status;Patient requests pain meds PT Visit Diagnosis: Other abnormalities of gait and mobility (R26.89);Muscle weakness (generalized) (M62.81);History of falling (Z91.81);Pain Pain - Right/Left: Left Pain - part of body: Hip     Time: 9155-9091 PT Time Calculation (min) (ACUTE ONLY): 24 min  Charges:    $Therapeutic Exercise: 8-22  mins $Therapeutic Activity: 8-22 mins PT General Charges $$ ACUTE PT VISIT: 1 Visit                    Lauraine Gills, PTA 11/25/23, 10:26 AM

## 2023-11-25 NOTE — Inpatient Diabetes Management (Signed)
 Inpatient Diabetes Program Recommendations  AACE/ADA: New Consensus Statement on Inpatient Glycemic Control   Target Ranges:  Prepandial:   less than 140 mg/dL      Peak postprandial:   less than 180 mg/dL (1-2 hours)      Critically ill patients:  140 - 180 mg/dL    Latest Reference Range & Units 11/24/23 08:06 11/24/23 11:56 11/24/23 15:58 11/24/23 20:26 11/25/23 00:44 11/25/23 04:13 11/25/23 07:37  Glucose-Capillary 70 - 99 mg/dL 837 (H)  Novolog  2 units  Lantus  15 units 234 (H)  Novolog  3 units 259 (H)  Novolog  5 units 294 (H)  Novolog  5 units 298 (H)  Novolog  5 units 175 (H)  Novolog  2 units 134 (H)  Novolog  1 unit   Review of Glycemic Control  Diabetes history: DM2 Outpatient Diabetes medications: Tresiba  50 units QPM, Metformin  XR 500 mg TID, Trulicity 0.75 mg Qweek (Friday) Current orders for Inpatient glycemic control: Lantus  15 units Q24H, Novolog  0-9 units Q4H   Inpatient Diabetes Program Recommendations:    Insulin :  CBGs ranged from 134-298 mg/dl over the past 24 hours with Novolog  correction Q4H.  Please consider increasing Lantus  to 20 units Q24H.  Thanks, Earnie Gainer, RN, MSN, CDCES Diabetes Coordinator Inpatient Diabetes Program [phone] (Team Pager from 8am to 5pm)

## 2023-11-25 NOTE — Progress Notes (Signed)
 Nutrition Follow-up  DOCUMENTATION CODES:   Not applicable  INTERVENTION:   -Continue carb modified diet -Continue MVI with minerals daily -D/c Glucerna shake -Magic cup BID with meals, each supplement provides 290 kcal and 9 grams of protein   NUTRITION DIAGNOSIS:   Increased nutrient needs related to post-op healing as evidenced by estimated needs.  Ongoing  GOAL:   Patient will meet greater than or equal to 90% of their needs  Progressing   MONITOR:   PO intake, Supplement acceptance  REASON FOR ASSESSMENT:   Consult Assessment of nutrition requirement/status, Hip fracture protocol  ASSESSMENT:   Pt with medical history significant for type diabetes mellitus, essential hypertension and dyslipidemia and intracranial aneurysm for which she declined intervention in 2023 at [facility], who presented with acute onset of vertigo with subsequent fall while washing her dishes and manage left hip pain.  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  8/27- s/p Left Hip Intramedullary nail   Reviewed I/O's: +230 ml x 24 hours and +5.3 L since admission  UOP: 2.3 L x 24 hours   Spoke with pt at bedside, who was pleasant and in good sprits today. Pt reports feeling okay today, stating that she did not sleep well secondary to neuropathy pain. Pt endorses poor appetite, however, improving over the past day or so. RD observed pt consume 75% of breakfast during visit. Per pt, she was becoming tired of receiving house trays, but was able to contact dining services to receive meals that she preferred.   Noted meal completions 60-100%. Pt has been refusing Glucerna supplements.   PTA, pt reports that she had lost about 100# over the past 6 months, which she attributes to  decreased appetite and Trulicity. Pt reports Truclity has curbed her appetite and her clothes have been following off of me, especially my underwear. RD discussed importance of ensure adequate nutritional intake while on medication by consuming small, frequent meals to preserve lean body mass.   Reviewed wt hx; pt has experienced a 11.4% wt loss over the past 9 months, which is not significant for time frame. Suspect some wt loss is related to truclity.   Discussed importance of good meal and supplement intake to promote healing.   Per CIR admissions notes, plan to determine plan for SNF vs CIR at discharge.   Labs reviewed: CBGS: 134-367 (inpatient orders for glycemic control are 0-9 units insulin  aspart every 4 hours and 15 units insulin  glargine daily).    NUTRITION - FOCUSED PHYSICAL EXAM:  Flowsheet Row Most Recent Value  Orbital Region No depletion  Upper Arm Region No depletion  Thoracic and Lumbar Region No depletion  Buccal Region No depletion  Temple Region No depletion  Clavicle and Acromion Bone Region No depletion  Scapular Bone Region No depletion  Dorsal Hand No depletion  Patellar Region Mild depletion  Anterior Thigh Region Mild depletion  Posterior Calf Region Mild depletion  Edema (RD Assessment) None  Hair Reviewed  Eyes Reviewed  Mouth Reviewed  Skin Reviewed  Nails Reviewed    Diet Order:   Diet Order             Diet Carb Modified Fluid consistency: Thin; Room service appropriate? Yes  Diet effective now  EDUCATION NEEDS:   No education needs have been identified at this time  Skin:  Skin Assessment: Skin Integrity Issues: Skin Integrity Issues:: Incisions Incisions: closed lt hip, closed lt leg  Last BM:  11/14/23  Height:   Ht Readings from Last 1 Encounters:  11/18/23 5' 4 (1.626 m)    Weight:   Wt Readings from Last 1 Encounters:  11/18/23 69.9 kg    Ideal Body Weight:  54.5 kg  BMI:  Body mass index is  26.43 kg/m.  Estimated Nutritional Needs:   Kcal:  1750-1950  Protein:  90-105 grams  Fluid:  1.7-1.9 L    Margery ORN, RD, LDN, CDCES Registered Dietitian III Certified Diabetes Care and Education Specialist If unable to reach this RD, please use RD Inpatient group chat on secure chat between hours of 8am-4 pm daily

## 2023-11-26 ENCOUNTER — Inpatient Hospital Stay (HOSPITAL_COMMUNITY)

## 2023-11-26 DIAGNOSIS — I63411 Cerebral infarction due to embolism of right middle cerebral artery: Secondary | ICD-10-CM

## 2023-11-26 DIAGNOSIS — M25512 Pain in left shoulder: Secondary | ICD-10-CM | POA: Diagnosis not present

## 2023-11-26 LAB — CBC WITH DIFFERENTIAL/PLATELET
Abs Immature Granulocytes: 0.02 10*3/uL (ref 0.00–0.07)
Basophils Absolute: 0 10*3/uL (ref 0.0–0.1)
Basophils Relative: 0 %
Eosinophils Absolute: 0.1 10*3/uL (ref 0.0–0.5)
Eosinophils Relative: 1 %
HCT: 30.2 % — ABNORMAL LOW (ref 36.0–46.0)
Hemoglobin: 9.8 g/dL — ABNORMAL LOW (ref 12.0–15.0)
Immature Granulocytes: 0 %
Lymphocytes Relative: 28 %
Lymphs Abs: 2 10*3/uL (ref 0.7–4.0)
MCH: 29.3 pg (ref 26.0–34.0)
MCHC: 32.5 g/dL (ref 30.0–36.0)
MCV: 90.4 fL (ref 80.0–100.0)
Monocytes Absolute: 0.9 10*3/uL (ref 0.1–1.0)
Monocytes Relative: 13 %
Neutro Abs: 4.1 10*3/uL (ref 1.7–7.7)
Neutrophils Relative %: 58 %
Platelets: 232 10*3/uL (ref 150–400)
RBC: 3.34 MIL/uL — ABNORMAL LOW (ref 3.87–5.11)
RDW: 13.2 % (ref 11.5–15.5)
WBC: 7 10*3/uL (ref 4.0–10.5)
nRBC: 0 % (ref 0.0–0.2)

## 2023-11-26 LAB — URINE CULTURE: Culture: NO GROWTH

## 2023-11-26 LAB — GLUCOSE, CAPILLARY
Glucose-Capillary: 129 mg/dL — ABNORMAL HIGH (ref 70–99)
Glucose-Capillary: 168 mg/dL — ABNORMAL HIGH (ref 70–99)
Glucose-Capillary: 198 mg/dL — ABNORMAL HIGH (ref 70–99)
Glucose-Capillary: 241 mg/dL — ABNORMAL HIGH (ref 70–99)
Glucose-Capillary: 329 mg/dL — ABNORMAL HIGH (ref 70–99)
Glucose-Capillary: 359 mg/dL — ABNORMAL HIGH (ref 70–99)

## 2023-11-26 LAB — COMPREHENSIVE METABOLIC PANEL WITH GFR
ALT: 14 U/L (ref 0–44)
AST: 16 U/L (ref 15–41)
Albumin: 2.2 g/dL — ABNORMAL LOW (ref 3.5–5.0)
Alkaline Phosphatase: 96 U/L (ref 38–126)
Anion gap: 10 (ref 5–15)
BUN: 15 mg/dL (ref 8–23)
CO2: 26 mmol/L (ref 22–32)
Calcium: 9 mg/dL (ref 8.9–10.3)
Chloride: 99 mmol/L (ref 98–111)
Creatinine, Ser: 1.18 mg/dL — ABNORMAL HIGH (ref 0.44–1.00)
GFR, Estimated: 49 mL/min — ABNORMAL LOW
Glucose, Bld: 133 mg/dL — ABNORMAL HIGH (ref 70–99)
Potassium: 4.3 mmol/L (ref 3.5–5.1)
Sodium: 135 mmol/L (ref 135–145)
Total Bilirubin: 0.5 mg/dL (ref 0.0–1.2)
Total Protein: 5.7 g/dL — ABNORMAL LOW (ref 6.5–8.1)

## 2023-11-26 MED ORDER — TRAMADOL HCL 50 MG PO TABS
50.0000 mg | ORAL_TABLET | Freq: Four times a day (QID) | ORAL | Status: DC
  Administered 2023-11-26 – 2023-12-10 (×56): 50 mg via ORAL
  Filled 2023-11-26 (×56): qty 1

## 2023-11-26 NOTE — Discharge Summary (Signed)
 Physician Discharge Summary  Patient ID: [name] MRN: [id number] DOB/AGE: 1951/01/04 73 y.o.  Admit date: 11/25/2023 Discharge date: 12/10/2023  Discharge Diagnoses:  Principal Problem:   Embolic stroke [facility]) Active Problems:   Intertrochanteric fracture of left hip (HCC)   Pressure injury of skin DVT prophylaxis Left nondisplaced intertrochanteric hip fracture of the proximal left femur Acute blood loss anemia Hypertension Diabetes mellitus AKI on CKD stage III Tobacco use Hyperlipidemia Constipation Thrush Urinary retention  Discharged Condition: Stable  Significant Diagnostic Studies: VAS US  LOWER EXTREMITY VENOUS (DVT) Result Date: 11/25/2023  Lower Venous DVT Study Patient Name:  [name]  Date of Exam:   11/25/2023 Medical Rec #: [id number]         Accession #:    [id number] Date of Birth: 71-28-1952         Patient Gender: F Patient Age:   73 years Exam Location:  [facility] Procedure:      VAS US  LOWER EXTREMITY VENOUS (DVT) Referring Phys: TORIBIO PITCH --------------------------------------------------------------------------------  Indications: Swelling, and Fracture left hip 11/19/23-.  Comparison Study: No priors. Performing Technologist: Ricka Sturdivant-Jones RDMS, RVT  Examination Guidelines: A complete evaluation includes B-mode imaging, spectral Doppler, color Doppler, and power Doppler as needed of all accessible portions of each vessel. Bilateral testing is considered an integral part of a complete examination. Limited examinations for reoccurring indications may be performed as noted. The reflux portion of the exam is performed with the patient in reverse Trendelenburg.  +---------+---------------+---------+-----------+----------+--------------+ RIGHT    CompressibilityPhasicitySpontaneityPropertiesThrombus Aging +---------+---------------+---------+-----------+----------+--------------+ CFV      Full           Yes      Yes                                  +---------+---------------+---------+-----------+----------+--------------+ SFJ      Full                                                        +---------+---------------+---------+-----------+----------+--------------+ FV Prox  Full                                                        +---------+---------------+---------+-----------+----------+--------------+ FV Mid   Full           Yes      Yes                                 +---------+---------------+---------+-----------+----------+--------------+ FV DistalFull                                                        +---------+---------------+---------+-----------+----------+--------------+ PFV      Full                                                        +---------+---------------+---------+-----------+----------+--------------+  POP      Full           Yes      Yes                                 +---------+---------------+---------+-----------+----------+--------------+ PTV      Full                                                        +---------+---------------+---------+-----------+----------+--------------+ PERO     Full                                                        +---------+---------------+---------+-----------+----------+--------------+   +---------+---------------+---------+-----------+----------+--------------+ LEFT     CompressibilityPhasicitySpontaneityPropertiesThrombus Aging +---------+---------------+---------+-----------+----------+--------------+ CFV      Full           Yes      Yes                                 +---------+---------------+---------+-----------+----------+--------------+ SFJ      Full                                                        +---------+---------------+---------+-----------+----------+--------------+ FV Prox  Full                                                         +---------+---------------+---------+-----------+----------+--------------+ FV Mid   Full           Yes      Yes                                 +---------+---------------+---------+-----------+----------+--------------+ FV DistalFull                                                        +---------+---------------+---------+-----------+----------+--------------+ PFV      Full                                                        +---------+---------------+---------+-----------+----------+--------------+ POP      Full           Yes      Yes                                 +---------+---------------+---------+-----------+----------+--------------+  PTV      Full                                                        +---------+---------------+---------+-----------+----------+--------------+ PERO     Full                                                        +---------+---------------+---------+-----------+----------+--------------+     Summary: BILATERAL: - No evidence of deep vein thrombosis seen in the lower extremities, bilaterally. -No evidence of popliteal cyst, bilaterally.   *See table(s) above for measurements and observations. Electronically signed by Debby Robertson on 11/25/2023 at 4:48:46 PM.    Final    ECHOCARDIOGRAM COMPLETE Result Date: 11/20/2023    ECHOCARDIOGRAM REPORT   Patient Name:   [name] Date of Exam: 11/20/2023 Medical Rec #:  [id number]        Height:       64.0 in Accession #:    [id number]       Weight:       154.0 lb Date of Birth:  1950-12-16        BSA:          1.751 m Patient Age:    73 years         BP:           130/66 mmHg Patient Gender: F                HR:           78 bpm. Exam Location:  [facility] Procedure: 2D Echo, Cardiac Doppler and Color Doppler (Both Spectral and Color            Flow Doppler were utilized during procedure). Indications:     Stroke I63.9  History:         Patient has no prior history of Echocardiogram  examinations.                  Stroke.  Sonographer:     Rosina Dunk Referring Phys:  [id number] DRUE ONEIDA POTTER Diagnosing Phys: Kathleen Lunger MD IMPRESSIONS  1. Left ventricular ejection fraction, by estimation, is 35 to 40%. Left ventricular ejection fraction by 2D MOD biplane is 36.4 %. The left ventricle has moderately decreased function. The left ventricle demonstrates global hypokinesis. There is mild asymmetric left ventricular hypertrophy. Left ventricular diastolic parameters are consistent with Grade I diastolic dysfunction (impaired relaxation).  2. Right ventricular systolic function is low normal. The right ventricular size is normal. There is normal pulmonary artery systolic pressure. The estimated right ventricular systolic pressure is 26.9 mmHg.  3. The mitral valve is normal in structure. Trivial mitral valve regurgitation. No evidence of mitral stenosis.  4. Tricuspid valve regurgitation is mild to moderate.  5. The aortic valve is tricuspid. There is mild calcification of the aortic valve. Aortic valve regurgitation is not visualized. Aortic valve sclerosis/calcification is present, without any evidence of aortic stenosis.  6. The inferior vena cava is normal in size with greater than 50% respiratory variability, suggesting right atrial pressure of 3 mmHg. FINDINGS  Left Ventricle: Left ventricular ejection fraction, by  estimation, is 35 to 40%. Left ventricular ejection fraction by 2D MOD biplane is 36.4 %. The left ventricle has moderately decreased function. The left ventricle demonstrates global hypokinesis. Strain was performed and the global longitudinal strain is indeterminate. The left ventricular internal cavity size was normal in size. There is mild asymmetric left ventricular hypertrophy. Left ventricular diastolic parameters are consistent with Grade  I diastolic dysfunction (impaired relaxation). Right Ventricle: The right ventricular size is normal. No increase in right  ventricular wall thickness. Right ventricular systolic function is low normal. There is normal pulmonary artery systolic pressure. The tricuspid regurgitant velocity is 2.34 m/s,  and with an assumed right atrial pressure of 5 mmHg, the estimated right ventricular systolic pressure is 26.9 mmHg. Left Atrium: Left atrial size was normal in size. Right Atrium: Right atrial size was normal in size. Pericardium: There is no evidence of pericardial effusion. Mitral Valve: The mitral valve is normal in structure. Trivial mitral valve regurgitation. No evidence of mitral valve stenosis. MV peak gradient, 4.5 mmHg. The mean mitral valve gradient is 2.0 mmHg. Tricuspid Valve: The tricuspid valve is normal in structure. Tricuspid valve regurgitation is mild to moderate. No evidence of tricuspid stenosis. Aortic Valve: The aortic valve is tricuspid. There is mild calcification of the aortic valve. Aortic valve regurgitation is not visualized. Aortic valve sclerosis/calcification is present, without any evidence of aortic stenosis. Aortic valve mean gradient measures 4.0 mmHg. Aortic valve peak gradient measures 6.7 mmHg. Aortic valve area, by VTI measures 1.10 cm. Pulmonic Valve: The pulmonic valve was not well visualized. Pulmonic valve regurgitation is trivial. No evidence of pulmonic stenosis. Aorta: The aortic root and ascending aorta are structurally normal, with no evidence of dilitation. Venous: The pulmonary veins were not well visualized. The inferior vena cava is normal in size with greater than 50% respiratory variability, suggesting right atrial pressure of 3 mmHg. IAS/Shunts: No atrial level shunt detected by color flow Doppler. Additional Comments: 3D was performed not requiring image post processing on an independent workstation and was indeterminate.  LEFT VENTRICLE PLAX 2D                        Biplane EF (MOD) LVIDd:         5.01 cm         LV Biplane EF:   Left LVIDs:         3.77 cm                           ventricular LV PW:         1.35 cm                          ejection LV IVS:        0.82 cm                          fraction by LVOT diam:     1.70 cm                          2D MOD LV SV:         28                               biplane is LV SV  Index:   16                               36.4 %. LVOT Area:     2.27 cm                                Diastology                                LV e' medial:    4.45 cm/s LV Volumes (MOD)               LV E/e' medial:  17.0 LV vol d, MOD    68.4 ml       LV e' lateral:   4.45 cm/s A2C:                           LV E/e' lateral: 17.0 LV vol d, MOD    65.8 ml A4C: LV vol s, MOD    37.1 ml A2C: LV vol s, MOD    45.3 ml A4C: LV SV MOD A2C:   31.3 ml LV SV MOD A4C:   65.8 ml LV SV MOD BP:    24.6 ml RIGHT VENTRICLE             IVC RV Basal diam:  3.38 cm     IVC diam: 1.48 cm RV Mid diam:    1.87 cm RV S prime:     11.30 cm/s TAPSE (M-mode): 1.6 cm LEFT ATRIUM             Index        RIGHT ATRIUM           Index LA diam:        2.40 cm 1.37 cm/m   RA Area:     11.70 cm LA Vol (A2C):   49.0 ml 27.99 ml/m  RA Volume:   27.40 ml  15.65 ml/m LA Vol (A4C):   50.6 ml 28.90 ml/m LA Biplane Vol: 49.9 ml 28.50 ml/m  AORTIC VALVE                    PULMONIC VALVE AV Area (Vmax):    1.10 cm     PV Vmax:        0.75 m/s AV Area (Vmean):   1.07 cm     PV Vmean:       51.900 cm/s AV Area (VTI):     1.10 cm     PV VTI:         0.133 m AV Vmax:           129.00 cm/s  PV Peak grad:   2.3 mmHg AV Vmean:          96.300 cm/s  PV Mean grad:   1.0 mmHg AV VTI:            0.258 m      RVOT Peak grad: 2 mmHg AV Peak Grad:      6.7 mmHg AV Mean Grad:      4.0 mmHg LVOT Vmax:         62.50 cm/s LVOT Vmean:        45.200 cm/s LVOT VTI:  0.125 m LVOT/AV VTI ratio: 0.48  AORTA Ao Root diam: 2.80 cm Ao Asc diam:  2.80 cm MITRAL VALVE                TRICUSPID VALVE MV Area (PHT): 5.58 cm     TR Peak grad:   21.9 mmHg MV Area VTI:   0.99 cm     TR Vmax:        234.00 cm/s MV Peak  grad:  4.5 mmHg MV Mean grad:  2.0 mmHg     SHUNTS MV Vmax:       1.06 m/s     Systemic VTI:  0.12 m MV Vmean:      69.2 cm/s    Systemic Diam: 1.70 cm MV Decel Time: 136 msec     Pulmonic VTI:  0.136 m MV E velocity: 75.70 cm/s MV A velocity: 101.00 cm/s MV E/A ratio:  0.75 Kathleen Lunger MD Electronically signed by Kathleen Lunger MD Signature Date/Time: 11/20/2023/12:04:38 PM    Final    CT ANGIO HEAD NECK W WO CM Result Date: 11/20/2023 CLINICAL DATA:  Follow-up examination for stroke. EXAM: CT ANGIOGRAPHY HEAD AND NECK WITH AND WITHOUT CONTRAST TECHNIQUE: Multidetector CT imaging of the head and neck was performed using the standard protocol during bolus administration of intravenous contrast. Multiplanar CT image reconstructions and MIPs were obtained to evaluate the vascular anatomy. Carotid stenosis measurements (when applicable) are obtained utilizing NASCET criteria, using the distal internal carotid diameter as the denominator. RADIATION DOSE REDUCTION: This exam was performed according to the departmental dose-optimization program which includes automated exposure control, adjustment of the mA and/or kV according to patient size and/or use of iterative reconstruction technique. CONTRAST:  75mL OMNIPAQUE  IOHEXOL  350 MG/ML SOLN COMPARISON:  Prior MRI from 11/19/2023 FINDINGS: CT HEAD FINDINGS Brain: Generalized age-related cerebral atrophy with mild chronic microvascular ischemic disease. Few scatter remote lacunar infarcts noted about the deep gray nuclei. Previously identified infarct involving the posterior right basal ganglia again seen, grossly stable. No other visible acute large vessel territory infarct by CT. No acute intracranial hemorrhage. No mass lesion or midline shift. No hydrocephalus or extra-axial fluid collection. Vascular: No abnormal hyperdense vessel. Scattered vascular calcifications noted within the carotid siphons. Skull: Scalp soft tissues within normal limits.  Calvarium intact.  Sinuses/Orbits: Globes and orbital soft tissues within normal limits. Visualized paranasal sinuses and mastoid air cells are largely clear. Other: None. Review of the MIP images confirms the above findings CTA NECK FINDINGS Aortic arch: Limits visualized arch within normal limits for caliber with standard branch pattern. Moderate aortic atherosclerosis. No significant stenosis about the origin the great vessels. Right carotid system: Right common and internal carotid arteries are patent without dissection. Atheromatous change about the right carotid bulb without hemodynamically significant greater than 50% stenosis. Left carotid system: Left common and internal carotid arteries are patent without dissection. Mild atheromatous change about the left mandible without hemodynamically severe greater than 50% stenosis. Vertebral arteries: Both vertebral arteries arise from subclavian arteries. Proximal left vertebral artery not well seen due to adjacent venous contamination. Visualized vertebral arteries patent without stenosis or dissection. Skeleton: No worrisome osseous lesions. Advanced spondylosis noted throughout the visualized cervicothoracic spine. Patient is edentulous. Other neck: No other acute finding. Upper chest: No other acute finding. Review of the MIP images confirms the above findings CTA HEAD FINDINGS Anterior circulation: Atheromatous change about the carotid siphons with associated mild to moderate stenoses bilaterally. A1 segments patent bilaterally. Normal anterior communicating artery  complex. Anterior cerebral arteries patent without significant stenosis. Normal in stenosis or occlusion. 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 No proximal MCA branch occlusion. Distal MCA branches perfused and symmetric. Posterior circulation: Both V4 segments patent without stenosis. 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 Left PICA patent at its origin. Right PICA not well seen. Basilar patent without stenosis. Superior cerebellar arteries patent bilaterally. Both PCAs primarily supplied via the basilar. Atheromatous change about the PCAs with associated moderate left P2 and severe right P3 stenoses (series 13, image 23). Arteries Venous sinuses: Grossly patent allowing for timing the contrast bolus. Anatomic variants: None significant. Review of the MIP images confirms the above findings IMPRESSION: CT HEAD: 1. No significant interval change in previously identified infarct involving the posterior right basal ganglia. No associated hemorrhage or mass effect. 2. No other new acute intracranial abnormality. 3. Underlying age-related cerebral atrophy with mild chronic small vessel ischemic disease. CTA HEAD AND NECK: 1. Negative CTA for large vessel occlusion or other emergent finding. 2. Atheromatous change about the carotid siphons with associated mild to moderate stenoses bilaterally. 3. Atheromatous change about the PCAs with associated moderate left P2 and severe right P3 stenoses. 4. 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 5.  Aortic Atherosclerosis (ICD10-I70.0). Electronically Signed   By: Morene Hoard M.D.   On: 11/20/2023 03:36   DG HIP UNILAT WITH PELVIS 2-3 VIEWS LEFT Result Date: 11/19/2023 EXAM: 2 or 3 VIEW(S) XRAY OF THE PELVIS AND LEFT HIP 11/19/2023 02:36:00 PM COMPARISON: None available. CLINICAL HISTORY: [id number] Surgery, elective [id number]. Left IM Nail; FINDINGS: Six fluoroscopic spot views of the left hip submitted from the operating room. Femoral intramedullary nail with transtrochanteric and distal locking screw fixation traverses proximal femur. Known fracture is not well demonstrated on the current exam. Fluoroscopy dose 18.40mg y; fluoroscopy time 1 min 43 sec  IMPRESSION: 1. Procedural fluoroscopy during left proximal femur fracture fixation. Electronically signed by: Andrea Gasman MD 11/19/2023 05:16 PM EDT RP Workstation: [id number]   DG C-Arm 1-60 Min-No Report Result Date: 11/19/2023 Fluoroscopy was utilized by the requesting physician.  No radiographic interpretation.   MR HIP LEFT WO CONTRAST Result Date: 11/19/2023 CLINICAL DATA:  Fall, intertrochanteric fracture EXAM: MR OF THE LEFT HIP WITHOUT CONTRAST TECHNIQUE: Multiplanar, multisequence MR imaging was performed. No intravenous contrast was administered. COMPARISON:  CT scan 11/17/2023 FINDINGS: Bones: MRI confirms the left intertrochanteric fracture, with high visibility of the fracture plane along the greater trochanter and mid intertrochanteric region, reduced visibility of lesser trochanteric extension. This extension is best seen extending along the proximal margin of the lesser trochanter posteriorly for example on image 18 series 3. No other fracture identified. Accentuated T2 signal in the pubic symphysis. Articular cartilage and labrum Articular cartilage:  Moderate degenerative chondral thinning Labrum:  Grossly intact. Joint or bursal effusion Joint effusion: Upper normal amount of fluid in the left hip joint without overt effusion. Bursae: Mild trochanteric bursitis. Muscles and tendons Muscles and tendons: Edema in the quadratus femoris muscle, image 25 series 14. Chronic appearing partial tearing of the right hamstring tendon. Mild edema tracks around the distal iliopsoas tendon. Other findings Miscellaneous: Lower lumbar spondylosis and degenerative disc disease. IMPRESSION: 1. MRI confirms the left intertrochanteric fracture, with high visibility of the fracture plane  along the greater trochanter and mid intertrochanteric region, reduced visibility of lesser trochanteric extension which is best seen posteriorly. 2. Mild trochanteric bursitis. 3. Edema in the quadratus femoris muscle. 4.  Chronic appearing partial tearing of the right (contralateral) hamstring tendon. 5. Lower lumbar spondylosis and degenerative disc disease. Electronically Signed   By: Ryan Salvage M.D.   On: 11/19/2023 09:25   MR BRAIN WO CONTRAST Result Date: 11/19/2023 CLINICAL DATA:  Neuro deficit, acute, stroke suspected EXAM: MRI HEAD WITHOUT CONTRAST TECHNIQUE: Multiplanar, multiecho pulse sequences of the brain and surrounding structures were obtained without intravenous contrast. COMPARISON:  CT head 11/19/2023. FINDINGS: Brain: Acute infarct in the posterior limb of the right internal capsule superiorly. Possible punctate acute infarct in the left middle cerebellar peduncle versus artifact (see series 5, image 15). Remote lacunar infarcts in the pons, right corona radiata and left basal ganglia. Scattered T2/FLAIR hyperintensities the white matter, compatible with chronic microvascular ischemic disease. No acute hemorrhage, mass lesion, midline shift or hydrocephalus. Cerebral atrophy. Evidence of prior hemorrhage in the inferior left basal ganglia. Vascular: Major arterial flow voids are maintained at the skull base. Skull and upper cervical spine: Normal marrow signal. Sinuses/Orbits: Negative. Other: No mastoid effusions. IMPRESSION: 1. Acute infarct in the posterior limb of the right internal capsule superiorly. 2. Possible punctate acute infarct in the left middle cerebellar peduncle versus artifact. 3. Remote lacunar infarcts in the pons, left basal ganglia and right corona radiata. Electronically Signed   By: Gilmore GORMAN Molt M.D.   On: 11/19/2023 03:39   DG Chest Portable 1 View Result Date: 11/19/2023 CLINICAL DATA:  Left hip fracture sent for preoperative evaluation. EXAM: PORTABLE CHEST 1 VIEW COMPARISON:  March 01, 2023 FINDINGS: The cardiac silhouette is enlarged and unchanged in size. Mild, stable atelectasis and/or scarring is seen within the left lung base. No pleural effusion or pneumothorax  is identified. Multilevel degenerative changes seen throughout the thoracic spine. IMPRESSION: Stable cardiomegaly with mild left basilar atelectasis and/or scarring. Electronically Signed   By: Suzen Dials M.D.   On: 11/19/2023 02:44   CT Hip Left Wo Contrast Result Date: 11/19/2023 CLINICAL DATA:  Status post fall. EXAM: CT OF THE LEFT HIP WITHOUT CONTRAST TECHNIQUE: Multidetector CT imaging of the left hip was performed according to the standard protocol. Multiplanar CT image reconstructions were also generated. RADIATION DOSE REDUCTION: This exam was performed according to the departmental dose-optimization program which includes automated exposure control, adjustment of the mA and/or kV according to patient size and/or use of iterative reconstruction technique. COMPARISON:  None Available. FINDINGS: Bones/Joint/Cartilage An acute, nondisplaced inter trochanteric fracture of the proximal left femur is seen. There is no evidence of dislocation. No significant arthropathy or focal bone lesions are identified. Ligaments Suboptimally assessed by CT. Muscles and Tendons Limited in evaluation and otherwise unremarkable. Soft tissues Very mild subcutaneous inflammatory fat stranding is seen along the lateral aspect of the left hip. IMPRESSION: Acute, nondisplaced intertrochanteric fracture of the proximal left femur. Electronically Signed   By: Suzen Dials M.D.   On: 11/19/2023 01:36   CT Cervical Spine Wo Contrast Result Date: 11/19/2023 CLINICAL DATA:  Dizziness with subsequent fall. EXAM: CT CERVICAL SPINE WITHOUT CONTRAST TECHNIQUE: Multidetector CT imaging of the cervical spine was performed without intravenous contrast. Multiplanar CT image reconstructions were also generated. RADIATION DOSE REDUCTION: This exam was performed according to the departmental dose-optimization program which includes automated exposure control, adjustment of the mA and/or kV according to patient size and/or use of  iterative reconstruction technique. COMPARISON:  October 24, 2023 FINDINGS: Alignment: There is reversal of the normal cervical spine lordosis. Skull base and vertebrae: No acute fracture. No primary bone lesion or focal pathologic process. Soft tissues and spinal canal: No prevertebral fluid or swelling. No visible canal hematoma. Disc levels: Marked severity multilevel endplate sclerosis, anterior osteophyte formation and posterior bony spurring are seen at the levels of C3-C4, C4-C5, C5-C6, C6-C7 and C7-T1. Marked severity intervertebral disc space narrowing is also seen at C3-C4, C4-C5, C5-C6, C6-C7 and C7-T1. Bilateral marked severity multilevel facet joint hypertrophy is noted. Upper chest: Negative. Other: None. IMPRESSION: Marked severity multilevel degenerative changes without evidence of an acute fracture or subluxation. Electronically Signed   By: Suzen Dials M.D.   On: 11/19/2023 01:00   CT HEAD WO CONTRAST ( ) Result Date: 11/19/2023 CLINICAL DATA:  Dizziness and subsequent fall. EXAM: CT HEAD WITHOUT CONTRAST TECHNIQUE: Contiguous axial images were obtained from the base of the skull through the vertex without intravenous contrast. RADIATION DOSE REDUCTION: This exam was performed according to the departmental dose-optimization program which includes automated exposure control, adjustment of the mA and/or kV according to patient size and/or use of iterative reconstruction technique. COMPARISON:  None Available. FINDINGS: Brain: There is generalized cerebral atrophy with widening of the extra-axial spaces and ventricular dilatation. There are areas of decreased attenuation within the white matter tracts of the supratentorial brain, consistent with microvascular disease changes. Chronic left basal ganglia lacunar infarcts are seen. Vascular: No hyperdense vessel or unexpected calcification. Skull: Normal. Negative for fracture or focal lesion. Sinuses/Orbits: No acute finding. Other: None.  IMPRESSION: 1. Generalized cerebral atrophy and microvascular disease changes of the supratentorial brain. 2. Chronic left basal ganglia lacunar infarcts. 3. No acute intracranial abnormality. Electronically Signed   By: Suzen Dials M.D.   On: 11/19/2023 00:58   DG Hip Unilat With Pelvis 2-3 Views Left Result Date: 11/19/2023 CLINICAL DATA:  Dizziness with subsequent fall. EXAM: DG HIP (WITH OR WITHOUT PELVIS) 2-3V LEFT COMPARISON:  None Available. FINDINGS: There is no evidence of acute hip fracture or dislocation. Mild degenerative changes are seen involving both hips in the form of joint space narrowing. Marked severity degenerative changes are present within the visualized portion of the lower lumbar spine. IMPRESSION: No acute osseous abnormality. Electronically Signed   By: Suzen Dials M.D.   On: 11/19/2023 00:53    Labs:  Basic Metabolic Panel: Recent Labs  Lab 11/19/23 0635 11/20/23 0421 11/21/23 0325 11/22/23 0622  NA 140 138 136 136  K 3.7 3.8 3.3* 3.4*  CL 105 107 101 102  CO2 24 25 25 27   GLUCOSE 308* 222* 157* 121*  BUN 14 15 10 8   CREATININE 1.08* 1.20* 0.89 0.92  CALCIUM  8.5* 8.4* 8.6* 8.4*    CBC: Recent Labs  Lab 11/20/23 0421 11/21/23 0325 11/22/23 0622  WBC 8.7 8.3 7.9  HGB 10.9* 11.5* 10.3*  HCT 33.9* 35.2* 32.0*  MCV 90.2 89.1 88.6  PLT 171 168 167    CBG: Recent Labs  Lab 11/25/23 1149 11/25/23 1659 11/25/23 2035 11/26/23 0018 11/26/23 0424  GLUCAP 235* 330* 218* 168* 129*    Brief HPI:   [name] is a 73 y.o. right-handed female with history significant for diabetes mellitus, CKD stage III, hypertension, hyperlipidemia, intracranial aneurysm for which she declined intervention 2023 at [facility], tobacco use.  Per chart review lives with brother and family members.  1 level home with a ramped entrance.  Reportedly modified  independent prior to admission.  Presented to [facility] 11/19/2023 with acute onset of vertigo and subsequent fall  while washing her dishes with reports of left hip pain.  She was unable to get up after the fall.  Admission chemistries unremarkable except glucose 370 creatinine 1.15 troponin 19-18, hemoglobin A1c 12.4, EKG normal sinus rhythm.  Cranial CT scan showed generalized cerebral atrophy with microvascular disease changes of the supratentorial brain.  Chronic left basal ganglia lacunar infarct.  No acute changes.  CT cervical spine negative.  CT of the left hip showed acute nondisplaced intertrochanteric fracture of the proximal left femur.  MRI showed acute infarct in the posterior limb of the right internal capsule superiorly.  Possible punctate acute infarct in the left middle cerebellar peduncle versus artifact.  Remote lacunar infarct in the pons, left basal ganglia and right corona radiata.  Patient did not receive TNK.  Echocardiogram with ejection fraction of 35 to 40%.  Grade 1 diastolic dysfunction.  The left ventricle demonstrating global hypokinesis.  Patient was cleared for surgical intervention and underwent left hip intramedullary nailing 11/19/2023 per Dr. Arthea Sheer.  Weightbearing as tolerated left lower extremity.  Acute blood loss anemia 10.3 and monitored.  Neurology follow-up in regards to CVA maintained on aspirin  and Plavix  indefinitely.  Subcutaneous Lovenox  for DVT prophylaxis.  Tolerating a regular diet.  Therapy evaluations completed due to patient's decreased functional mobility was admitted for a comprehensive rehab program.   Hospital Course: [name] was admitted to rehab 11/25/2023 for inpatient therapies to consist of PT, ST and OT at least three hours five days a week. Past admission physiatrist, therapy team and rehab RN have worked together to provide customized collaborative inpatient rehab.  Pertaining to patient's infarct posterior limb of the right internal capsule.  Remote lacunar infarct in the pons left basal ganglia and right corona radiata as well as history of  intracranial aneurysm declined intervention 2023.  Patient was followed by neurology services maintained on low-dose aspirin  and Plavix  indefinitely follow-up outpatient neurology.  Patient was cleared to begin Lovenox  for DVT prophylaxis.  Hospital course complicated by left nondisplaced intertrochanteric hip fracture of the proximal left femur status post IM nailing 11/19/2023 weightbearing as tolerated.  Neurovascular sensation intact.  Pain management with the use of Neurontin  scheduled with oxycodone  and tramadol  for breakthrough pain as well as Voltaren  gel.  She was using Robaxin  for muscle spasms.  Acute blood loss anemia no bleeding episodes and monitored.  Blood pressure controlled with Norvasc  Coreg  and lisinopril  and monitored with increased mobility and patient would need outpatient follow-up.  Blood sugars had some initial variables hemoglobin A1c 12.4 insulin  therapy as directed patient had been on Glucophage  and Trulicity/Tresiba  prior to admission.  AKI on CKD stage III monitoring of chemistries remaining stable.  Lipitor ongoing for hyperlipidemia.  Patient did have a history of tobacco use maintained on NicoDerm patch receiving counseling in regards to cessation of nicotine  products.  Bouts of constipation resolved with laxative assistance.  Initially upon admission to rehab services patient with some elevated postvoid residual scan for 484 caths for 550 placed on Flomax  and monitored voiding improved and Flomax  discontinued.   Blood pressures were monitored on TID basis and remained controlled and monitored  Diabetes has been monitored with ac/hs CBG checks and SSI was use prn for tighter BS control.    Rehab course: During patient's stay in rehab weekly team conferences were held to monitor patient's progress, set goals and discuss barriers to  discharge. At admission, patient required max assist squat pivot transfers moderate assist step pivot transfers moderate assist to feet with a left  platform rolling walker  He/She  has had improvement in activity tolerance, balance, postural control as well as ability to compensate for deficits. He/She has had improvement in functional use RUE/LUE  and RLE/LLE as well as improvement in awareness.  Perform sit to stand transfers functional ability close supervision verbal cues for hand placement.  Perform step to 4 inch step with left lower extremity with bilateral upper extremity support rolling walker.  Performed supine to sit edge of bed with overall minimal assist verbal cues to scoot towards head of bed.  Perform sit to stand pivot transfers throughout sessions with rolling walker contact-guard verbal cues to increase weightbearing on upper extremities when advancing right lower extremity due to increased pain.  She can ambulate approximately 10 to 20 feet rolling walker contact-guard verbal cues to increase weightbearing bilateral upper extremities.  Grooming completed at sink modified independent.  Upper body dressing donned brawl and overhead shirt with set up assist.  Lower body dressing patient donned pants with moderate assist.  Footwear donned socks with assistance for time management.  Bathing completed at sink overall minimal assist.  During SLP sessions patient completing memory book from previous sessions.  Patient independent recall medication management tasks.  SLP prompted completion of meal budgeting tasks.  Family did not feel they could provide necessary physical assistance at home and patient was discharged to skilled nursing facility.       Disposition:  There are no questions and answers to display.         Diet: Diabetic diet  Special Instructions: No driving smoking or alcohol  Weightbearing as tolerated left lower extremity  Medications at discharge. 1.  Tylenol  as needed 2.  Norvasc  10 mg p.o. daily 3.  Aspirin  81 mg p.o. daily 4.  Lipitor 40 mg p.o. daily 5.  Zyban  150 mg twice daily 6.  Os-Cal 1 tablet  daily 7.  Coreg  25 mg p.o. twice daily 8.  Plavix  75 mg p.o. daily 9.  Vitamin B12 1000 mcg p.o. daily 10.  Voltaren  gel 4 g 4 times daily to affected area 11.  Colace 100 mg p.o. twice daily 12.  MiraLAX  daily.  Hold for loose stools 13.  Neurontin  800 mg p.o. 3 times daily 14.  Lisinopril  40 mg p.o. daily 15.  Robaxin  500 mg every 8 hours as needed muscle spasms 16.  Multivitamin daily 17.  NicoDerm patch taper as directed 18.  Oxycodone  5 mg every 4 hours as needed pain 19.  Glucophage  500 mg daily 20.  Tresiba  40 units daily 21.  Trulicity 0.75 mg into the skin weekly    30-35 minutes were spent completing discharge summary and discharge planning   Discharge Instructions     Ambulatory referral to Neurology   Complete by: As directed    An appointment is requested in approximately: 4 weeks posterior limb right internal capsule infarction        Follow-up Information     Bender, Stefano Iles, MD Follow up.   Specialty: Family Medicine Why: Call for appointment Contact information: [address] [place] [address] [phone]         Lorelle Hussar, MD Follow up.   Specialty: Orthopedic Surgery Why: Call for appointment Contact information: [address] [place] [address] [phone]  Signed: Toribio PARAS Amayah Staheli 11/26/2023, 4:41 AM

## 2023-11-26 NOTE — Plan of Care (Signed)
  Problem: RH Balance Goal: LTG: Patient will maintain dynamic sitting balance (OT) Description: LTG:  Patient will maintain dynamic sitting balance with assistance during activities of daily living (OT) Flowsheets (Taken 11/26/2023 1557) LTG: Pt will maintain dynamic sitting balance during ADLs with: Independent with assistive device Goal: LTG Patient will maintain dynamic standing with ADLs (OT) Description: LTG:  Patient will maintain dynamic standing balance with assist during activities of daily living (OT)  Flowsheets (Taken 11/26/2023 1557) LTG: Pt will maintain dynamic standing balance during ADLs with: Contact Guard/Touching assist   Problem: Sit to Stand Goal: LTG:  Patient will perform sit to stand in prep for activites of daily living with assistance level (OT) Description: LTG:  Patient will perform sit to stand in prep for activites of daily living with assistance level (OT) Flowsheets (Taken 11/26/2023 1557) LTG: PT will perform sit to stand in prep for activites of daily living with assistance level: Independent with assistive device   Problem: RH Bathing Goal: LTG Patient will bathe all body parts with assist levels (OT) Description: LTG: Patient will bathe all body parts with assist levels (OT) Flowsheets (Taken 11/26/2023 1557) LTG: Pt will perform bathing with assistance level/cueing: Minimal Assistance - Patient > 75%   Problem: RH Dressing Goal: LTG Patient will perform lower body dressing w/assist (OT) Description: LTG: Patient will perform lower body dressing with assist, with/without cues in positioning using equipment (OT) Flowsheets (Taken 11/26/2023 1557) LTG: Pt will perform lower body dressing with assistance level of: Minimal Assistance - Patient > 75%   Problem: RH Toileting Goal: LTG Patient will perform toileting task (3/3 steps) with assistance level (OT) Description: LTG: Patient will perform toileting task (3/3 steps) with assistance level (OT)  Flowsheets  (Taken 11/26/2023 1557) LTG: Pt will perform toileting task (3/3 steps) with assistance level: Minimal Assistance - Patient > 75%   Problem: RH Toilet Transfers Goal: LTG Patient will perform toilet transfers w/assist (OT) Description: LTG: Patient will perform toilet transfers with assist, with/without cues using equipment (OT) Flowsheets (Taken 11/26/2023 1557) LTG: Pt will perform toilet transfers with assistance level of: Contact Guard/Touching assist   Problem: RH Tub/Shower Transfers Goal: LTG Patient will perform tub/shower transfers w/assist (OT) Description: LTG: Patient will perform tub/shower transfers with assist, with/without cues using equipment (OT) Flowsheets (Taken 11/26/2023 1557) LTG: Pt will perform tub/shower stall transfers with assistance level of: Minimal Assistance - Patient > 75%   Problem: RH Memory Goal: LTG Patient will demonstrate ability for day to day recall/carry over during activities of daily living with assistance level (OT) Description: LTG:  Patient will demonstrate ability for day to day recall/carry over during activities of daily living with assistance level (OT). Flowsheets (Taken 11/26/2023 1557) LTG:  Patient will demonstrate ability for day to day recall/carry over during activities of daily living with assistance level (OT):  Supervision  Minimal Assistance - Patient > 75%   Problem: RH Attention Goal: LTG Patient will demonstrate this level of attention during functional activites (OT) Description: LTG:  Patient will demonstrate this level of attention during functional activites  (OT) Flowsheets (Taken 11/26/2023 1557) Patient will demonstrate this level of attention during functional activites: Selective Patient will demonstrate above attention level in the following environment: Home LTG: Patient will demonstrate this level of attention during functional activites (OT): Minimal Assistance - Patient > 75%

## 2023-11-26 NOTE — Progress Notes (Signed)
 PROGRESS NOTE   Subjective/Complaints:  Left shoulder pain noted per OT.  No speech  issues  Objective:   VAS US  LOWER EXTREMITY VENOUS (DVT) Result Date: 11/25/2023  Lower Venous DVT Study Patient Name:  [name]  Date of Exam:   11/25/2023 Medical Rec #: [id number]         Accession #:    [id number] Date of Birth: 05-21-1950         Patient Gender: F Patient Age:   73 years Exam Location:  [facility] Procedure:      VAS US  LOWER EXTREMITY VENOUS (DVT) Referring Phys: TORIBIO PITCH --------------------------------------------------------------------------------  Indications: Swelling, and Fracture left hip 11/19/23-.  Comparison Study: No priors. Performing Technologist: Ricka Sturdivant-Jones RDMS, RVT  Examination Guidelines: A complete evaluation includes B-mode imaging, spectral Doppler, color Doppler, and power Doppler as needed of all accessible portions of each vessel. Bilateral testing is considered an integral part of a complete examination. Limited examinations for reoccurring indications may be performed as noted. The reflux portion of the exam is performed with the patient in reverse Trendelenburg.  +---------+---------------+---------+-----------+----------+--------------+ RIGHT    CompressibilityPhasicitySpontaneityPropertiesThrombus Aging +---------+---------------+---------+-----------+----------+--------------+ CFV      Full           Yes      Yes                                 +---------+---------------+---------+-----------+----------+--------------+ SFJ      Full                                                        +---------+---------------+---------+-----------+----------+--------------+ FV Prox  Full                                                        +---------+---------------+---------+-----------+----------+--------------+ FV Mid   Full           Yes      Yes                                  +---------+---------------+---------+-----------+----------+--------------+ FV DistalFull                                                        +---------+---------------+---------+-----------+----------+--------------+ PFV      Full                                                        +---------+---------------+---------+-----------+----------+--------------+  POP      Full           Yes      Yes                                 +---------+---------------+---------+-----------+----------+--------------+ PTV      Full                                                        +---------+---------------+---------+-----------+----------+--------------+ PERO     Full                                                        +---------+---------------+---------+-----------+----------+--------------+   +---------+---------------+---------+-----------+----------+--------------+ LEFT     CompressibilityPhasicitySpontaneityPropertiesThrombus Aging +---------+---------------+---------+-----------+----------+--------------+ CFV      Full           Yes      Yes                                 +---------+---------------+---------+-----------+----------+--------------+ SFJ      Full                                                        +---------+---------------+---------+-----------+----------+--------------+ FV Prox  Full                                                        +---------+---------------+---------+-----------+----------+--------------+ FV Mid   Full           Yes      Yes                                 +---------+---------------+---------+-----------+----------+--------------+ FV DistalFull                                                        +---------+---------------+---------+-----------+----------+--------------+ PFV      Full                                                         +---------+---------------+---------+-----------+----------+--------------+ POP      Full           Yes      Yes                                 +---------+---------------+---------+-----------+----------+--------------+  PTV      Full                                                        +---------+---------------+---------+-----------+----------+--------------+ PERO     Full                                                        +---------+---------------+---------+-----------+----------+--------------+     Summary: BILATERAL: - No evidence of deep vein thrombosis seen in the lower extremities, bilaterally. -No evidence of popliteal cyst, bilaterally.   *See table(s) above for measurements and observations. Electronically signed by Debby Robertson on 11/25/2023 at 4:48:46 PM.    Final    Recent Labs    11/26/23 0452  WBC 7.0  HGB 9.8*  HCT 30.2*  PLT 232   Recent Labs    11/26/23 0452  NA 135  K 4.3  CL 99  CO2 26  GLUCOSE 133*  BUN 15  CREATININE 1.18*  CALCIUM  9.0    Intake/Output Summary (Last 24 hours) at 11/26/2023 0921 Last data filed at 11/26/2023 0800 Gross per 24 hour  Intake 120 ml  Output 1008 ml  Net -888 ml        Physical Exam: Vital Signs Blood pressure (!) 124/59, pulse 78, temperature 98.3 F (36.8 C), temperature source Oral, resp. rate 19, height 5' 4 (1.626 m), weight 73.9 kg, SpO2 97%.   General: No acute distress Mood and affect are appropriate Heart: Regular rate and rhythm no rubs murmurs or extra sounds Lungs: Clear to auscultation, breathing unlabored, no rales or wheezes Abdomen: Positive bowel sounds, soft nontender to palpation, nondistended Extremities: No clubbing, cyanosis, or edema Skin: No evidence of breakdown, no evidence of rash Neurologic: Cranial nerves II through XII intact, motor strength is 5/5 in right deltoid, bicep, tricep, grip, hip flexor, knee extensors, ankle dorsiflexor and plantar flexor Left delt and  biceps limited by pain normal grip, WF, WE,  LLE limited by pain for HF, KE, 4/5 ankle PF and DF Sensory exam normal sensation to light touch and proprioception in bilateral upper and lower extremities  Musculoskeletal: Full range of motion in all 4 extremities. No joint swelling   Assessment/Plan: 1. Functional deficits which require 3+ hours per day of interdisciplinary therapy in a comprehensive inpatient rehab setting. Physiatrist is providing close team supervision and 24 hour management of active medical problems listed below. Physiatrist and rehab team continue to assess barriers to discharge/monitor patient progress toward functional and medical goals  Care Tool:  Bathing              Bathing assist       Upper Body Dressing/Undressing Upper body dressing        Upper body assist      Lower Body Dressing/Undressing Lower body dressing            Lower body assist       Toileting Toileting    Toileting assist       Transfers Chair/bed transfer  Transfers assist           Locomotion Ambulation   Ambulation assist  Walk 10 feet activity   Assist           Walk 50 feet activity   Assist           Walk 150 feet activity   Assist           Walk 10 feet on uneven surface  activity   Assist           Wheelchair     Assist               Wheelchair 50 feet with 2 turns activity    Assist            Wheelchair 150 feet activity     Assist          Blood pressure (!) 124/59, pulse 78, temperature 98.3 F (36.8 C), temperature source Oral, resp. rate 19, height 5' 4 (1.626 m), weight 73.9 kg, SpO2 97%.  Medical Problem List and Plan: 1. 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  Possible punctate infarct left middle cerebellar peduncle versus artifact.  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             -patient may  shower-cover L hip dressing with occlusive dressing             -ELOS/Goals: 13-15 days- mod I to supervision             Admit to CIR 2.  Antithrombotics: -DVT/anticoagulation:  Pharmaceutical: Lovenox .  Check vascular study             -antiplatelet therapy: Aspirin  81 mg daily and Plavix  75 mg daily 3. Pain Management: Neurontin  800 mg 3 times daily, Voltaren  gel 4 g 4 times daily to affected area, oxycodone  5 mg every 4 hours as needed, tramadol  50 mg every 6 hours as needed, Robaxin  500 mg every 8 hours as needed- of note, doesn't like to take meds- encouraged her to do so prior to therapy 4. Mood/Behavior/Sleep: Wellbutrin  150 mg twice daily, trazodone  25 mg nightly as needed             -antipsychotic agents: N/A 5. Neuropsych/cognition: This patient is capable of making decisions on her own behalf. 6. Skin/Wound Care: Routine skin checks 7. Fluids/Electrolytes/Nutrition: Routine in and outs with follow-up chemistries 8.  Left nondisplaced intertrochanteric hip fracture of the proximal left femur.  Status post IM nailing 11/19/2023.  Weightbearing as tolerated 9.  Acute blood loss anemia.  Follow-up CBC 10.  Hypertension.  Norvasc  10 mg daily, Coreg  25 mg twice daily, lisinopril  40 mg daily.  Monitor with increased mobility Vitals:   11/26/23 0427 11/26/23 0750  BP: 135/63 (!) 124/59  Pulse: 78 78  Resp: 19   Temp: 98.3 F (36.8 C)   SpO2: 97%     11.  Diabetes mellitus.  Hemoglobin A1c 12.4.  Currently on Lantus  insulin  15 units every 24 hours.  Check blood sugars AC and at bedtime.  Prior to admission patient on Glucophage  500 mg daily, Trulicity weekly CBG (last 3)  Recent Labs    11/26/23 0018 11/26/23 0424 11/26/23 0757  GLUCAP 168* 129* 241*    12.  AKI on CKD stage IIIB.  Follow-up chemistries    Latest Ref Rng & Units 11/26/2023    4:52 AM 11/22/2023    6:22 AM  11/21/2023  3:25 AM  BMP  Glucose 70 - 99 mg/dL 866  878  842   BUN 8 - 23 mg/dL 15  8  10    Creatinine 0.44 - 1.00 mg/dL 8.81  9.07  9.10   Sodium 135 - 145 mmol/L 135  136  136   Potassium 3.5 - 5.1 mmol/L 4.3  3.4  3.3   Chloride 98 - 111 mmol/L 99  102  101   CO2 22 - 32 mmol/L 26  27  25    Calcium  8.9 - 10.3 mg/dL 9.0  8.4  8.6     13.  Tobacco abuse.  NicoDerm patch.  Provide counseling 14.  Hyperlipidemia.  Lipitor 15.  Constipation.  Adjust bowel program as needed- gave Sorbitol  30cc since lBM 4-5 days ago- if doesn't have good results, suggest Soap Suds enema.  16. Urinary retention with overflow- ordered bladder scans and to cath q6 hours prn if bladder scan >350cc- also ordered U/A and Cx, and Flomax  0.4 mg q supper -at this time U/A is in process- I ordered in/out caths if volumes >350cc myself  17. Mild spasticity- monitor for symptoms 18. Of note, pt's mother is 63 years old and still doing functionally well! 19. Thrush- ordered nystatin  5ml q6 hours for mild thrush- might need Diflucan- monitor        LOS: 1 days A FACE TO FACE EVALUATION WAS PERFORMED  [name] 11/26/2023, 9:21 AM

## 2023-11-26 NOTE — Evaluation (Signed)
 Occupational Therapy Assessment and Plan  Patient Details  Name: [name] MRN: [id number] Date of Birth: [birth year]  OT Diagnosis: acute pain, hemiplegia affecting non-dominant side, muscle weakness (generalized), pain in joint, and swelling of limb Rehab Potential: Rehab Potential (ACUTE ONLY): Fair ELOS: 12-14 days   Today's Date: 11/26/2023 OT Individual Time: 9159-9049 OT Individual Time Calculation (min): 70 min     Hospital Problem: Principal Problem:   Embolic stroke (HCC) Active Problems:   Intertrochanteric fracture of left hip (HCC)   Pressure injury of skin   Past Medical History:  Past Medical History:  Diagnosis Date   Diabetes mellitus without complication (HCC)    Past Surgical History:  Past Surgical History:  Procedure Laterality Date   INTRAMEDULLARY (IM) NAIL INTERTROCHANTERIC Left 11/19/2023   Procedure: FIXATION, FRACTURE, INTERTROCHANTERIC, WITH INTRAMEDULLARY ROD;  Surgeon: Lorelle Hussar, MD;  Location: [facility] ORS;  Service: Orthopedics;  Laterality: Left;   IR ANGIO INTRA EXTRACRAN SEL INTERNAL CAROTID BILAT MOD SED  07/05/2021   IR ANGIO VERTEBRAL SEL VERTEBRAL UNI R MOD SED  07/05/2021   IR RADIOLOGIST EVAL & MGMT  07/16/2021   IR US  GUIDE VASC ACCESS RIGHT  07/05/2021    Assessment & Plan Clinical Impression: [name] is a 73 year old right-handed female with history significant for diabetes mellitus- A1c now 12.4 , CKD stage IIIb, hypertension, hyperlipidemia intracranial aneurysm for which she declined intervention 2023 at [facility], tobacco use. Per chart review patient lives with brother and other family members. 1 level home with ramped entrance. Reportedly modified independent at baseline for ADLs ambulating with a rolling walker household distances. Presented to [facility] 11/19/2023 with acute onset of vertigo and subsequent fall while washing her dishes with reports of left hip pain. She was unable to get up after the fall. Admission chemistries  unremarkable except glucose 370, creatinine 1.15, troponin 19-18, hemoglobin A1c 12.4, EKG normal sinus rhythm. Cranial CT scan showed generalized cerebral atrophy with microvascular disease changes of the supratentorial brain. Chronic left basal ganglia lacunar infarct. No acute changes. CT cervical spine negative. CT of the left hip showed acute nondisplaced intertrochanteric fracture of the proximal left femur. MRI showed acute infarct in the posterior limb of the right internal capsule superiorly. Possible punctate acute infarct in the left middle cerebellar peduncle versus artifact. Remote lacunar infarct in the pons, left basal ganglia and right corona radiata. Patient did not receive TNK. Echocardiogram with ejection fraction of 35 to 40%. Grade 1 diastolic dysfunction. The left ventricle demonstrating global hypokinesis. Patient was cleared for surgical intervention and underwent left hip intramedullary nail 11/19/2023 per Dr. Hussar Lorelle. Weightbearing as tolerated left lower extremity. Acute blood loss anemia 10.3 and monitored. Neurology follow-up in regards to CVA maintained on aspirin  and Plavix  indefinitely. Patient maintained on Lovenox  for DVT prophylaxis. Tolerating a regular consistency diet. Therapy evaluations completed due to patient's decreased functional mobility was admitted for a comprehensive rehab program. Patient transferred to CIR on 11/25/2023 .    Patient currently requires total with basic self-care skills secondary to muscle weakness and muscle joint tightness, unbalanced muscle activation, decreased attention, decreased problem solving, decreased safety awareness, decreased memory, and delayed processing, and decreased sitting balance, decreased standing balance, hemiplegia, and decreased balance strategies.  Prior to hospitalization, patient could complete BADL  with modified independent .  Patient will benefit from skilled intervention to decrease level of assist with basic  self-care skills and increase independence with basic self-care skills prior to discharge home with care partner.  Anticipate patient will require minimal physical assistance and follow up home health.  OT - End of Session Activity Tolerance: Decreased this session Endurance Deficit: Yes OT Assessment Rehab Potential (ACUTE ONLY): Fair OT Barriers to Discharge: Medication compliance OT Patient demonstrates impairments in the following area(s): Balance;Behavior;Pain;Cognition;Edema;Safety;Endurance;Motor OT Basic ADL's Functional Problem(s): Bathing;Dressing;Toileting OT Transfers Functional Problem(s): Toilet;Tub/Shower OT Additional Impairment(s): None OT Plan OT Intensity: Minimum of 1-2 x/day, 45 to 90 minutes OT Frequency: 5 out of 7 days OT Duration/Estimated Length of Stay: 12-14 days OT Treatment/Interventions: Balance/vestibular training;Disease mangement/prevention;Patient/family education;Self Care/advanced ADL retraining;Therapeutic Exercise;UE/LE Coordination activities;Wheelchair propulsion/positioning;UE/LE Strength taining/ROM;Therapeutic Activities;Psychosocial support;Pain management;Functional mobility training;DME/adaptive equipment instruction;Discharge planning;Cognitive remediation/compensation OT Self Feeding Anticipated Outcome(s): Independent OT Basic Self-Care Anticipated Outcome(s): Min assist OT Toileting Anticipated Outcome(s): Min assist OT Bathroom Transfers Anticipated Outcome(s): Min assist OT Recommendation Recommendations for Other Services: None Patient destination: Home Follow Up Recommendations: Home health OT Equipment Recommended: To be determined   OT Evaluation Precautions/Restrictions  Precautions Precautions: Fall Recall of Precautions/Restrictions: Impaired Restrictions Weight Bearing Restrictions Per Provider Order: Yes LLE Weight Bearing Per Provider Order: Weight bearing as tolerated Other Position/Activity Restrictions: Patient has  a boil on her buttocks that limits ability to sit   Vital Signs Therapy Vitals Temp: 98.6 F (37 C) Temp Source: Oral Pulse Rate: 75 Resp: 18 BP: (!) 146/71 Patient Position (if appropriate): Lying Oxygen Therapy SpO2: 95 % O2 Device: Room Air Pain Pain Assessment Pain Scale: 0-10 Pain Score: 10-Worst pain ever Pain Location: Hip Pain Orientation: Left Pain Descriptors / Indicators: Guarding;Grimacing;Stabbing Pain Onset: On-going Home Living/Prior Functioning Home Living Family/patient expects to be discharged to:: Private residence Living Arrangements: Children, Other relatives, Parent Available Help at Discharge: Family, Available PRN/intermittently Type of Home: House Home Access: Ramped entrance Home Layout: One level Bathroom Shower/Tub: Engineer, manufacturing systems: Handicapped height Additional Comments: Pt has been using her mom's w/c in community at times.  Pt's brother is in/out during the day; pt's son works 10:30 am to 9 pm.  Pt (and pt's family) assist with caregiving for her mother.  Lives With: Family IADL History Homemaking Responsibilities: Yes Meal Prep Responsibility: Primary Laundry Responsibility: Primary Cleaning Responsibility: Secondary Shopping Responsibility: Secondary Child Care Responsibility: No Current License: Yes Mode of Transportation: Car Occupation: Retired Prior Function Level of Independence: Independent with transfers, Requires assistive device for independence, Independent with gait Vocation: Retired Administrator, sports Baseline Vision/History: 1 Wears glasses Ability to See in Adequate Light: 1 Impaired Patient Visual Report: Blurring of vision Vision Assessment?: No apparent visual deficits Perception  Perception: Within Functional Limits Praxis Praxis: WFL Cognition Cognition Overall Cognitive Status: Impaired/Different from baseline Arousal/Alertness: Awake/alert Orientation Level: Person;Place;Situation Person:  Oriented Place: Oriented Situation: Oriented Memory: Impaired Memory Impairment: Decreased short term memory;Decreased recall of new information;Retrieval deficit Decreased Short Term Memory: Verbal basic;Functional basic Attention: Sustained Sustained Attention: Impaired Sustained Attention Impairment: Functional basic Awareness: Impaired Awareness Impairment: Emergent impairment Problem Solving: Impaired Problem Solving Impairment: Verbal basic;Functional basic Executive Function: Organizing;Self Monitoring Organizing: Impaired Organizing Impairment: Functional basic Self Monitoring: Impaired Self Monitoring Impairment: Functional basic Behaviors: Poor frustration tolerance;Lability Safety/Judgment: Impaired Brief Interview for Mental Status (BIMS) Repetition of Three Words (First Attempt): 3 Temporal Orientation: Year: Correct Temporal Orientation: Month: Accurate within 5 days Temporal Orientation: Day: Correct Recall: Sock: Yes, no cue required Recall: Blue: Yes, no cue required Recall: Bed: No, could not recall BIMS Summary Score: 13 Sensation Sensation Light Touch: Appears Intact Hot/Cold: Appears Intact Proprioception: Impaired by gross assessment Stereognosis: Not tested Coordination Gross  Motor Movements are Fluid and Coordinated: No Fine Motor Movements are Fluid and Coordinated: Yes Heel Shin Test: unable 2/2 pain. Motor  Motor Motor: Other (comment) Motor - Skilled Clinical Observations: mild L hemi  Trunk/Postural Assessment  Cervical Assessment Cervical Assessment: Within Functional Limits Thoracic Assessment Thoracic Assessment: Within Functional Limits Lumbar Assessment Lumbar Assessment: Exceptions to [facility] [place] Postural Control Postural Control: Within Functional Limits  Balance Balance Balance Assessed: Yes Static Sitting Balance Static Sitting - Level of Assistance: 5: Stand by assistance Dynamic Sitting Balance Dynamic Sitting - Level of  Assistance: 5: Stand by assistance Static Standing Balance Static Standing - Balance Support: Bilateral upper extremity supported Static Standing - Level of Assistance: 3: Mod assist Dynamic Standing Balance Dynamic Standing - Balance Support: Bilateral upper extremity supported Dynamic Standing - Level of Assistance: 2: Max assist Extremity/Trunk Assessment RUE Assessment RUE Assessment: Within Functional Limits LUE Assessment LUE Assessment: Exceptions to [facility] Active Range of Motion (AROM) Comments: Patient has limited active shoulder movement in initial 40* of motion (flexion) She reports this started after fall at nail salon  Care Tool Care Tool Self Care Eating Eating activity did not occur: Refused      Oral Care    Oral Care Assist Level: Minimal Assistance - Patient > 75%    Bathing     Body parts bathed by helper: Right arm;Left arm;Chest;Abdomen;Front perineal area;Buttocks;Right upper leg;Left upper leg;Right lower leg;Left lower leg;Face   Assist Level: Total Assistance - Patient < 25% (nursing bath prior to therapy session)    Upper Body Dressing(including orthotics)   What is the patient wearing?: Bra;Pull over shirt   Assist Level: Maximal Assistance - Patient 25 - 49%    Lower Body Dressing (excluding footwear)   What is the patient wearing?: Incontinence brief;Pants Assist for lower body dressing: Total Assistance - Patient < 25%    Putting on/Taking off footwear   What is the patient wearing?: Non-skid slipper socks Assist for footwear: Total Assistance - Patient < 25%       Care Tool Toileting Toileting activity   Assist for toileting: Dependent - Patient 0%     Care Tool Bed Mobility Roll left and right activity   Roll left and right assist level: Minimal Assistance - Patient > 75%    Sit to lying activity   Sit to lying assist level: Moderate Assistance - Patient 50 - 74%    Lying to sitting on side of bed activity   Lying to sitting on side  of bed assist level: the ability to move from lying on the back to sitting on the side of the bed with no back support.: Moderate Assistance - Patient 50 - 74%     Care Tool Transfers Sit to stand transfer   Sit to stand assist level: Moderate Assistance - Patient 50 - 74%    Chair/bed transfer   Chair/bed transfer assist level: Moderate Assistance - Patient 50 - 74%     Toilet transfer   Assist Level: Maximal Assistance - Patient 24 - 49%     Care Tool Cognition  Expression of Ideas and Wants Expression of Ideas and Wants: 4. Without difficulty (complex and basic) - expresses complex messages without difficulty and with speech that is clear and easy to understand  Understanding Verbal and Non-Verbal Content Understanding Verbal and Non-Verbal Content: 3. Usually understands - understands most conversations, but misses some part/intent of message. Requires cues at times to understand   Memory/Recall Ability Memory/Recall Ability : Current  season;That he or she is in a hospital/hospital unit   Refer to Care Plan for Long Term Goals  SHORT TERM GOAL WEEK 1 OT Short Term Goal 1 (Week 1): Patient will complete toilet transfer with mod assist OT Short Term Goal 2 (Week 1): Patient will don pants with mod assist OT Short Term Goal 3 (Week 1): Patient will complete toileting with mod assist  Recommendations for other services: None    Skilled Therapeutic Intervention:  Patient received supported sitting in bed with nursing providing care.  Patient completing oral care at bedside.  Patient fully bathed and dressed prior to this session.  Patient with report of pain in left hip, as well in her buttocks when sitting - patient reports she has a boil which is uncomfortable.  Patient needs increased assistance to transition to sit at edge of bed.  Patient in sitting does not shift her weight onto left hip.  Patient with limited range of motion in left shoulder in initial 40* of flexion - she reports  this has been this way since her fall.  Spoke with MD during rounding to share this finding.   Patient indicated need to void and in attempting to transfer her to the toilet, she had an incontinent episode.  Patient assisted to clean herself and don clean brief.  Patient very focused on incision areas stating they are very tender.  Left up in chair at end of session with personal items in reach.   ADL ADL Eating: Unable to assess Grooming: Minimal assistance Where Assessed-Grooming: Bed level Upper Body Bathing: Dependent Where Assessed-Upper Body Bathing: Bed level Lower Body Bathing: Dependent Where Assessed-Lower Body Bathing: Bed level Upper Body Dressing: Maximal assistance Where Assessed-Upper Body Dressing: Wheelchair Lower Body Dressing: Dependent Where Assessed-Lower Body Dressing: Bed level Toileting: Dependent Where Assessed-Toileting: Teacher, adult education: Maximal Dentist Method: Surveyor, minerals: Gaffer: Unable to assess Tub/Shower Transfer Method: Unable to assess [employer] Transfer: Unable to assess [employer] Transfer Method: Unable to assess Mobility  Bed Mobility Bed Mobility: Rolling Right;Rolling Left;Supine to Sit;Sit to Supine Rolling Right: Minimal Assistance - Patient > 75% Rolling Left: Minimal Assistance - Patient > 75% Supine to Sit: Moderate Assistance - Patient 50-74% Sit to Supine: Moderate Assistance - Patient 50-74% Transfers Sit to Stand: Moderate Assistance - Patient 50-74% Stand to Sit: Moderate Assistance - Patient 50-74%   Discharge Criteria: Patient will be discharged from OT if patient refuses treatment 3 consecutive times without medical reason, if treatment goals not met, if there is a change in medical status, if patient makes no progress towards goals or if patient is discharged from hospital.  The above assessment, treatment plan, treatment alternatives and  goals were discussed and mutually agreed upon: by patient  Advit Trethewey M 11/26/2023, 3:48 PM

## 2023-11-26 NOTE — Patient Care Conference (Signed)
 Inpatient RehabilitationTeam Conference and Plan of Care Update Date: 11/26/2023   Time: 10:09 AM    Patient Name: [name]      Medical Record Number: [id number]  Date of Birth: 08-14-1950 Sex: Female         Room/Bed: 4W08C/4W08C-01 Payor Info: Payor: Advertising copywriter MEDICARE / Plan: [facility] : [facility] MEDICARE / Product Type: *No Product type* /    Admit Date/Time:  11/25/2023  2:08 PM  Primary Diagnosis:  Embolic stroke [facility])  Hospital Problems: Principal Problem:   Embolic stroke (HCC) Active Problems:   Intertrochanteric fracture of left hip (HCC)   Pressure injury of skin    Expected Discharge Date: Expected Discharge Date:  (Evals pending)  Team Members Present: Physician leading conference: Dr. Prentice Compton Social Worker Present: Rhoda Clement, LCSW Nurse Present: Barnie Ronde, RN PT Present: Sherlean Perks, PT;Dominic Sandoval, PTA OT Present: Katheryn Mines, OT SLP Present: Blaise Alderman, SLP     Current Status/Progress Goal Weekly Team Focus  Bowel/Bladder   Pt continent of bowel/bladder. Bladder scan q 6 hours, I/O cath >350.   Remain continent of bowel/bladder during stay.   Toilet q 4 hours while awake and PRN.    Swallow/Nutrition/ Hydration   pending eval           ADL's      Evals pending          Mobility      Evals pending         Communication   pending eval            Safety/Cognition/ Behavioral Observations  pending eval            Pain   Pt c/o L hip pain 9/10 and medicated with PRN Oxycodone . Positive effect noted.   Pain to remain less than or equal to 3 on pain scale.   Assess for pain q shift and PRN.    Skin   Two small incisions L hip with honycomb dressings. Stage 2 Sacrum with foam.   Skin to remain free from further breakdown and s/sx of infection.  Assess skin q shift and PRN.      Discharge Planning:  new evaluation unsure of the plan lives with mom who is elderly and needs assist, brother who is in  and out and son who works. Will try to confirm plan when see today   Team Discussion: Patient admitted post embolic CVA with history of multiple strokes and rotator cuff repair re- injured with fall and left hip fracture.  Patient on target to meet rehab goals: Evals pending, progress limited by pain in left LE.   *See Care Plan and progress notes for long and short-term goals.   Revisions to Treatment Plan:  N/a   Teaching Needs: Safety, medications, transfers, toileting, weight bearing restrictions, etc.  Current Barriers to Discharge: Decreased caregiver support, Home enviroment access/layout, and Weight bearing restrictions  Possible Resolutions to Barriers: Family education     Medical Summary Current Status: left shoulder and hip pain inhibiting motion  Barriers to Discharge: Medical stability;Uncontrolled Pain   Possible Resolutions to [employer]: schedule pain meds, xray shoulder   Continued Need for Acute Rehabilitation Level of Care: The patient requires daily medical management by a physician with specialized training in physical medicine and rehabilitation for the following reasons: Direction of a multidisciplinary physical rehabilitation program to maximize functional independence : Yes Medical management of patient stability for increased activity during participation in an intensive rehabilitation regime.:  Yes Analysis of laboratory values and/or radiology reports with any subsequent need for medication adjustment and/or medical intervention. : Yes   I attest that I was present, lead the team conference, and concur with the assessment and plan of the team.   [name] 11/26/2023, 1:26 PM

## 2023-11-26 NOTE — Evaluation (Signed)
 Speech Language Pathology Assessment and Plan  Patient Details  Name: [name] MRN: [id number] Date of Birth: 10-08-1950  SLP Diagnosis: Cognitive Impairments  Rehab Potential: Good (prior CVAs) ELOS: 10-14 days    Today's Date: 11/26/2023 SLP Individual Time: 1330-1430 SLP Individual Time Calculation (min): 60 min   Hospital Problem: Principal Problem:   Embolic stroke (HCC) Active Problems:   Intertrochanteric fracture of left hip (HCC)   Pressure injury of skin  Past Medical History:  Past Medical History:  Diagnosis Date   Diabetes mellitus without complication (HCC)    Past Surgical History:  Past Surgical History:  Procedure Laterality Date   INTRAMEDULLARY (IM) NAIL INTERTROCHANTERIC Left 11/19/2023   Procedure: FIXATION, FRACTURE, INTERTROCHANTERIC, WITH INTRAMEDULLARY ROD;  Surgeon: Lorelle Hussar, MD;  Location: [facility] ORS;  Service: Orthopedics;  Laterality: Left;   IR ANGIO INTRA EXTRACRAN SEL INTERNAL CAROTID BILAT MOD SED  07/05/2021   IR ANGIO VERTEBRAL SEL VERTEBRAL UNI R MOD SED  07/05/2021   IR RADIOLOGIST EVAL & MGMT  07/16/2021   IR US  GUIDE VASC ACCESS RIGHT  07/05/2021    Assessment / Plan / Recommendation Clinical Impression HPI: Pt is a 73 y/o female with PMH of DM, HTN, HLD, and intracranial aneurysm who presented to [facility] on 11/18/23 after sudden onset vertigo causing a fall. In ED BP 157/73, with hyperglycemia, albumin 3.3, and creatinine 1.15. CT revealed acute nondisplaced intertrochanteric fx of the proximal L femur. Orthopedics was consulted and recommended operative management, and pt underwent IMN of L femur fx per Dr. Lorelle on 8/27. Given nature of fall MRI was ordered in ED and revealed an acute infarct in the posterior limb of the right internal capsule as well as a punctate infarct in the left middle cerebellar peduncle. Neurology consulted and she endorsed unsteadiness x2 weeks. CTA head/neck negative for LVO. Echo revealed 35-40% EF.  Recommendations for DAPT indefinitely. Therapy ongoing and pt was recommended for CIR.   Clinical Impression:  Patient was evaluated via the Cognistat to assess cognitive linguistic functioning. Patient scored with strengths in expressive/receptive language, orientation and attention. Patient with mild deficits in executive functioning skills, moderate deficits in visual spatial construction and severe deficits in delayed recall. Informally, noted deficits in short/long term memory (difficulty recalling information re: jobs, family), sustained attention and slowed processing speed. Observed x1 instance of word finding difficulty likely related to memory rather than true language. Patients son reports significant change in cognitive abilities since recent stroke - reporting patient can no longer manage computer and recall complex biographical information. Patient would benefit from IP ST services targeting sustained attention, memory, basic-mild complex problem solving and emergent awareness.  Pt would benefit from skilled ST services to maximize cognition in order to maximize functional independence at d/c. Anticipate patient will require supervision at d/c and f/u SLP services.    Skilled Therapeutic Interventions          Patient evaluated using a standardized cognitive linguistic assessment to assess current cognitive and communicative function See above for details.    SLP Assessment  Patient will need skilled Speech Lanaguage Pathology Services during CIR admission    Recommendations  SLP Diet Recommendations: Age appropriate regular solids;Thin Liquid Administration via: Cup;Straw Medication Administration: Whole meds with liquid Compensations: Slow rate;Small sips/bites Postural Changes and/or Swallow Maneuvers: Seated upright 90 degrees Oral Care Recommendations: Oral care BID Patient destination: Home Follow up Recommendations: Home Health SLP;Outpatient SLP Equipment Recommended: None  recommended by SLP    SLP Frequency  3 to 5 out of 7 days   SLP Duration  SLP Intensity  SLP Treatment/Interventions 10-14 days  Minumum of 1-2 x/day, 30 to 90 minutes  Cognitive remediation/compensation;Internal/external aids;Cueing hierarchy;Functional tasks;Patient/family education;Therapeutic Activities    Pain None reported to SLP  SLP Evaluation Cognition Overall Cognitive Status: Impaired/Different from baseline Arousal/Alertness: Awake/alert Orientation Level: Oriented X4 Year: 2025 Month: September Day of Week: Correct Attention: Sustained Sustained Attention: Impaired Sustained Attention Impairment: Verbal complex;Functional complex Memory: Impaired Memory Impairment: Decreased short term memory;Decreased recall of new information Decreased Short Term Memory: Verbal basic;Functional basic Awareness: Impaired Awareness Impairment: Emergent impairment Problem Solving: Impaired Problem Solving Impairment: Verbal basic;Functional basic Safety/Judgment: Impaired  Comprehension Auditory Comprehension Overall Auditory Comprehension: Appears within functional limits for tasks assessed Expression Expression Primary Mode of Expression: Verbal Verbal Expression Overall Verbal Expression: Appears within functional limits for tasks assessed Oral Motor Oral Motor/Sensory Function Overall Oral Motor/Sensory Function: Within functional limits Motor Speech Overall Motor Speech: Appears within functional limits for tasks assessed  Care Tool Care Tool Cognition Ability to hear (with hearing aid or hearing appliances if normally used Ability to hear (with hearing aid or hearing appliances if normally used): 0. Adequate - no difficulty in normal conservation, social interaction, listening to TV   Expression of Ideas and Wants Expression of Ideas and Wants: 4. Without difficulty (complex and basic) - expresses complex messages without difficulty and with speech that is clear  and easy to understand   Understanding Verbal and Non-Verbal Content Understanding Verbal and Non-Verbal Content: 3. Usually understands - understands most conversations, but misses some part/intent of message. Requires cues at times to understand  Memory/Recall Ability Memory/Recall Ability : Current season;That he or she is in a hospital/hospital unit    Short Term Goals: Week 1: SLP Short Term Goal 1 (Week 1): Patient will demonstrate sustained attention during functional tasks for >5 minutes given mod multimodal A SLP Short Term Goal 2 (Week 1): Patient will recall new, daily information given mod multimodal A SLP Short Term Goal 3 (Week 1): Patient will demonstrate simple problem solving skills during functional tasks given min multimodal A SLP Short Term Goal 4 (Week 1): Patient will demonstrate emergent awareness of mistakes given min verbal A  Refer to Care Plan for Long Term Goals  Recommendations for other services: None   Discharge Criteria: Patient will be discharged from SLP if patient refuses treatment 3 consecutive times without medical reason, if treatment goals not met, if there is a change in medical status, if patient makes no progress towards goals or if patient is discharged from hospital.  The above assessment, treatment plan, treatment alternatives and goals were discussed and mutually agreed upon: by patient and by family  Devean Skoczylas M.A., CCC-SLP 11/26/2023, 2:34 PM

## 2023-11-26 NOTE — Progress Notes (Signed)
 Inpatient Rehabilitation Care Coordinator Assessment and Plan Patient Details  Name: [name] MRN: [id number] Date of Birth: [birth year]  Today's Date: 11/26/2023  Hospital Problems: Principal Problem:   Embolic stroke [facility]) Active Problems:   Intertrochanteric fracture of left hip (HCC)   Pressure injury of skin  Past Medical History:  Past Medical History:  Diagnosis Date   Diabetes mellitus without complication (HCC)    Past Surgical History:  Past Surgical History:  Procedure Laterality Date   INTRAMEDULLARY (IM) NAIL INTERTROCHANTERIC Left 11/19/2023   Procedure: FIXATION, FRACTURE, INTERTROCHANTERIC, WITH INTRAMEDULLARY ROD;  Surgeon: Kathleen Hussar, MD;  Location: [facility] ORS;  Service: Orthopedics;  Laterality: Left;   IR ANGIO INTRA EXTRACRAN SEL INTERNAL CAROTID BILAT MOD SED  07/05/2021   IR ANGIO VERTEBRAL SEL VERTEBRAL UNI R MOD SED  07/05/2021   IR RADIOLOGIST EVAL & MGMT  07/16/2021   IR US  GUIDE VASC ACCESS RIGHT  07/05/2021   Social History:  reports that she has been smoking cigarettes. She started smoking about 50 years ago. She has a 25.3 pack-year smoking history. She has never been exposed to tobacco smoke. She has never used smokeless tobacco. She reports that she does not currently use alcohol. She reports that she does not use drugs.  Family / Support Systems Marital Status: Separated How Long?: pt unsure how long do not live together Patient Roles: Parent, Caregiver, Other (Comment), Spouse (sibling) Spouse/Significant Other: [name] was visiting pt today can not assist pt Children: [name] Other Supports: Brother and Mom-100 yo Anticipated Caregiver: No one can provide care according to pt she will need to be mod/i to return home Ability/Limitations of Caregiver: Son works10:30-9:00pm  Brother is in and out, Mom who is 7 yo goes to [place] during the day and needs assist when at [employer] pt's brother does Caregiver  Availability: Intermittent Family Dynamics: Recently separated from husband she lives with her son, brother and Mom now and all have obligations and will not be providing care to her. Husband is here visiting but will not assist pt at discharge, according to him  Social History Preferred language: English Religion: None Cultural Background: NA Education: HS Health Literacy - How often do you need to have someone help you when you read instructions, pamphlets, or other written material from your doctor or pharmacy?: Never Writes: Yes Employment Status: Retired Marine scientist Issues: Separated from husband unsure if legal or not Guardian/Conservator: None-according to MD pt is capable of making her own decisions while here   Abuse/Neglect Abuse/Neglect Assessment Can Be Completed: Yes Physical Abuse: Denies Verbal Abuse: Denies Sexual Abuse: Denies Exploitation of patient/patient's resources: Denies Self-Neglect: Denies  Patient response to: Social Isolation - How often do you feel lonely or isolated from those around you?: Never  Emotional Status Pt's affect, behavior and adjustment status: Pt is motivated to recover but does have leg and shoulder issue which limit her also. She does not want to go to a NH and plans to go home. Not sure who will help her if needed though Recent Psychosocial Issues: other health issues-shoulder bothering her, MD having a x-ray done Psychiatric History: No history seems to have a lot going on and may benefit from seeing neuro-psych while here Substance Abuse History: Tobacco quit a few months ago  Patient / Family Perceptions, Expectations & Goals Pt/Family understanding of illness & functional limitations: Pt can explain her hip, shoulder and CVA. She seems to have more issues with her shoulder and hip then  the stroke. She is moving and trying but is limited. She does talk with the MD and feels understands her plan moving  forward. Premorbid pt/family roles/activities: mom, daughter, sibling, retiree, friend, caregiver, etc Anticipated changes in roles/activities/participation: resume Pt/family expectations/goals: Pt states:   I hope to do well here and go home I don't want to go to a nursing home.  Husband did not say anything while visiting  [employer]: None Premorbid Home Care/DME Agencies: None Transportation available at discharge: family Is the patient able to respond to transportation needs?: Yes In the past 12 months, has lack of transportation kept you from medical appointments or from getting medications?: No In the past 12 months, has lack of transportation kept you from meetings, work, or from getting things needed for daily living?: No Resource referrals recommended: Neuropsychology  Discharge Planning Living Arrangements: Children, Other relatives, Parent Support Systems: Children, Parent, Other relatives, Friends/neighbors, Spouse/significant other Type of Residence: Private residence Insurance Resources: Media planner (specify) ([employer] Medicare) Surveyor, quantity Resources: Tree surgeon, Family Support Financial Screen Referred: No Living Expenses: Lives with family Money Management: Patient, Family Does the patient have any problems obtaining your medications?: No Home Management: self Patient/Family Preliminary Plans: Return home with brother, mom and son all have other obligations and can only provide intermittent assist. Pt will need to be mod/i to return home safely from here. Will await team's evaluations and work on safe plan for her Care Coordinator Barriers to Discharge: Decreased caregiver support, Insurance for SNF coverage, Lack of/limited family support Care Coordinator Anticipated Follow Up Needs: HH/OP  Clinical Impression Pleasant female who has a lot going on with shoulder issues, CVA's and Hip fracture. She is recently separated and now lives  with brother, mom and son. There is no one to provide assist and pt will need to be mod/I to go home safely. Will await therapy evaluations and work in best plan for her.  [name] 11/26/2023, 1:43 PM

## 2023-11-26 NOTE — Progress Notes (Signed)
 Inpatient Rehabilitation  Patient information reviewed and entered into eRehab system by [facility]. Karen Kays., CCC/SLP, PPS Coordinator.  Information including medical coding, functional ability and quality indicators will be reviewed and updated through discharge.

## 2023-11-26 NOTE — Plan of Care (Signed)
  Problem: RH Problem Solving Goal: LTG Patient will demonstrate problem solving for (SLP) Description: LTG:  Patient will demonstrate problem solving for basic/complex daily situations with cues  (SLP) Flowsheets (Taken 11/26/2023 1437) LTG: Patient will demonstrate problem solving for (SLP): Basic daily situations LTG Patient will demonstrate problem solving for: Supervision   Problem: RH Memory Goal: LTG Patient will demonstrate ability for day to day (SLP) Description: LTG:   Patient will demonstrate ability for day to day recall/carryover during cognitive/linguistic activities with assist  (SLP) Flowsheets (Taken 11/26/2023 1437) LTG: Patient will demonstrate ability for day to day recall:  New information  Biographical information LTG: Patient will demonstrate ability for day to day recall/carryover during cognitive/linguistic activities with assist (SLP): Minimal Assistance - Patient > 75%   Problem: RH Attention Goal: LTG Patient will demonstrate this level of attention during functional activites (SLP) Description: LTG:  Patient will will demonstrate this level of attention during functional activites (SLP) Flowsheets (Taken 11/26/2023 1437) Patient will demonstrate during cognitive/linguistic activities the attention type of: Sustained LTG: Patient will demonstrate this level of attention during cognitive/linguistic activities with assistance of (SLP): Supervision Number of minutes patient will demonstrate attention during cognitive/linguistic activities: 10   Problem: RH Awareness Goal: LTG: Patient will demonstrate awareness during functional activites type of (SLP) Description: LTG: Patient will demonstrate awareness during functional activites type of (SLP) Flowsheets (Taken 11/26/2023 1437) Patient will demonstrate during cognitive/linguistic activities awareness type of: Emergent LTG: Patient will demonstrate awareness during cognitive/linguistic activities with assistance of  (SLP): Minimal Assistance - Patient > 75%

## 2023-11-26 NOTE — Evaluation (Signed)
 Physical Therapy Assessment and Plan  Patient Details  Name: [name] MRN: [id number] Date of Birth: 07/08/1950  PT Diagnosis: Abnormal posture, Abnormality of gait, Difficulty walking, Hemiplegia non-dominant, Impaired sensation, Muscle weakness, and Pain in L hip Rehab Potential: Good ELOS: 10-14 days   Today's Date: 11/26/2023 PT Individual Time: 1047-1200 PT Individual Time Calculation (min): 73 min    Hospital Problem: Principal Problem:   Embolic stroke (HCC) Active Problems:   Intertrochanteric fracture of left hip (HCC)   Pressure injury of skin   Past Medical History:  Past Medical History:  Diagnosis Date   Diabetes mellitus without complication (HCC)    Past Surgical History:  Past Surgical History:  Procedure Laterality Date   INTRAMEDULLARY (IM) NAIL INTERTROCHANTERIC Left 11/19/2023   Procedure: FIXATION, FRACTURE, INTERTROCHANTERIC, WITH INTRAMEDULLARY ROD;  Surgeon: Lorelle Hussar, MD;  Location: [facility] ORS;  Service: Orthopedics;  Laterality: Left;   IR ANGIO INTRA EXTRACRAN SEL INTERNAL CAROTID BILAT MOD SED  07/05/2021   IR ANGIO VERTEBRAL SEL VERTEBRAL UNI R MOD SED  07/05/2021   IR RADIOLOGIST EVAL & MGMT  07/16/2021   IR US  GUIDE VASC ACCESS RIGHT  07/05/2021    Assessment & Plan Clinical Impression: [name] is a 73 year old right-handed female with history significant for diabetes mellitus- A1c now 12.4 , CKD stage IIIb, hypertension, hyperlipidemia intracranial aneurysm for which she declined intervention 2023 at [facility], tobacco use. Per chart review patient lives with brother and other family members. 1 level home with ramped entrance. Reportedly modified independent at baseline for ADLs ambulating with a rolling walker household distances. Presented to [facility] 11/19/2023 with acute onset of vertigo and subsequent fall while washing her dishes with reports of left hip pain. She was unable to get up after the fall. Admission chemistries unremarkable  except glucose 370, creatinine 1.15, troponin 19-18, hemoglobin A1c 12.4, EKG normal sinus rhythm. Cranial CT scan showed generalized cerebral atrophy with microvascular disease changes of the supratentorial brain. Chronic left basal ganglia lacunar infarct. No acute changes. CT cervical spine negative. CT of the left hip showed acute nondisplaced intertrochanteric fracture of the proximal left femur. MRI showed acute infarct in the posterior limb of the right internal capsule superiorly. Possible punctate acute infarct in the left middle cerebellar peduncle versus artifact. Remote lacunar infarct in the pons, left basal ganglia and right corona radiata. Patient did not receive TNK. Echocardiogram with ejection fraction of 35 to 40%. Grade 1 diastolic dysfunction. The left ventricle demonstrating global hypokinesis. Patient was cleared for surgical intervention and underwent left hip intramedullary nail 11/19/2023 per Dr. Hussar Lorelle. Weightbearing as tolerated left lower extremity. Acute blood loss anemia 10.3 and monitored. Neurology follow-up in regards to CVA maintained on aspirin  and Plavix  indefinitely. Patient maintained on Lovenox  for DVT prophylaxis. Tolerating a regular consistency diet. Therapy evaluations completed due to patient's decreased functional mobility was admitted for a comprehensive rehab program   Patient currently requires max with mobility secondary to muscle weakness and decreased coordination and L hip pain..  Prior to hospitalization, patient was modified independent  with mobility and lived with Family in a House home.  Home access is  Ramped entrance.  Patient will benefit from skilled PT intervention to maximize safe functional mobility, minimize fall risk, and decrease caregiver burden for planned discharge home with 24 hour supervision.  Anticipate patient will benefit from follow up HH at discharge.  PT - End of Session Activity Tolerance: Tolerates 10 - 20 min activity  with multiple  rests Endurance Deficit: Yes PT Assessment Rehab Potential (ACUTE/IP ONLY): Good PT Barriers to Discharge: Other (comments) PT Barriers to Discharge Comments: increased pain w/ decreased ability to WB LLE. PT Patient demonstrates impairments in the following area(s): Balance;Sensory;Endurance;Motor;Pain PT Transfers Functional Problem(s): Bed Mobility;Bed to Chair;Car;Furniture PT Locomotion Functional Problem(s): Ambulation;Wheelchair Mobility;Stairs PT Plan PT Intensity: Minimum of 1-2 x/day ,45 to 90 minutes PT Frequency: 5 out of 7 days PT Duration Estimated Length of Stay: 10-14 days PT Treatment/Interventions: Ambulation/gait training;Community reintegration;Neuromuscular re-education;Stair training;UE/LE Strength taining/ROM;Wheelchair propulsion/positioning;Pain management;Discharge planning;Therapeutic Activities;UE/LE Coordination activities;Therapeutic Exercise;Patient/family education;Functional mobility training PT Transfers Anticipated Outcome(s): Mod I PT Locomotion Anticipated Outcome(s): Mod I PT Recommendation Recommendations for Other Services: None Follow Up Recommendations: Home health PT Patient destination: Home Equipment Recommended: To be determined Equipment Details: pt has SPC, RW and use of w/c from mother.   PT Evaluation Precautions/Restrictions Precautions Precautions: Fall Recall of Precautions/Restrictions: Intact Restrictions Weight Bearing Restrictions Per Provider Order: Yes LLE Weight Bearing Per Provider Order: Weight bearing as tolerated Other Position/Activity Restrictions: Patient has a boil on her buttocks that limits ability to sit General Chart Reviewed: Yes Family/Caregiver Present: Yes Vital Signs  Pain Pain Assessment Pain Scale: 0-10 Pain Score: 10-Worst pain ever Pain Location: Hip Pain Orientation: Left Pain Descriptors / Indicators: Guarding;Grimacing;Stabbing Pain Onset: On-going Pain Intervention(s):  Medication (See eMAR);Repositioned Pain Interference Pain Interference Pain Effect on Sleep: 3. Frequently Pain Interference with Therapy Activities: 4. Almost constantly Pain Interference with Day-to-Day Activities: 4. Almost constantly Home Living/Prior Functioning Home Living Available Help at Discharge: Family;Available PRN/intermittently Type of Home: House Home Access: Ramped entrance Home Layout: One level Bathroom Shower/Tub: Tub/shower unit (has a tub chair but has been sponge bathing.) Bathroom Toilet: Handicapped height Additional Comments: Pt has been using her mom's w/c in community at times.  Pt's brother is in/out during the day; pt's son works 10:30 am to 9 pm.  Pt (and pt's family) assist with caregiving for her mother.  Lives With: Family Prior Function Level of Independence: Independent with transfers;Requires assistive device for independence;Independent with gait Vocation: Retired Vision/Perception  Vision - History Ability to See in Adequate Light: 1 Impaired Perception Perception: Within Functional Limits Praxis Praxis: WFL  Cognition Overall Cognitive Status: Impaired/Different from baseline Arousal/Alertness: Awake/alert Orientation Level: Oriented X4 Attention: Sustained Sustained Attention: Appears intact Memory: Impaired Memory Impairment: Retrieval deficit Awareness: Appears intact Problem Solving: Impaired Problem Solving Impairment: Functional basic Executive Function: Organizing;Self Monitoring Organizing: Impaired Organizing Impairment: Functional basic Self Monitoring: Impaired Self Monitoring Impairment: Functional basic Safety/Judgment: Impaired Sensation Sensation Light Touch: Appears Intact (although neuropathy into calves.) Hot/Cold: (P) Appears Intact Proprioception: (P) Appears Intact Stereognosis: (P) Appears Intact Coordination Gross Motor Movements are Fluid and Coordinated: No Heel Shin Test: unable 2/2 pain. Motor   Motor Motor: Other (comment) Motor - Skilled Clinical Observations: mild L hemi   Trunk/Postural Assessment  Cervical Assessment Cervical Assessment: Within Functional Limits Thoracic Assessment Thoracic Assessment: Within Functional Limits Lumbar Assessment Lumbar Assessment: Exceptions to [facility] (post pelvic tilt.) Postural Control Postural Control: Within Functional Limits  Balance Balance Balance Assessed: Yes Static Sitting Balance Static Sitting - Level of Assistance: 5: Stand by assistance Dynamic Sitting Balance Dynamic Sitting - Level of Assistance: 5: Stand by assistance Static Standing Balance Static Standing - Balance Support: Bilateral upper extremity supported Static Standing - Level of Assistance: 3: Mod assist Dynamic Standing Balance Dynamic Standing - Balance Support: Bilateral upper extremity supported Dynamic Standing - Level of Assistance: 2: Max assist Extremity Assessment  LUE Assessment LUE Assessment: Exceptions to [facility] Active Range of Motion (AROM) Comments: Patient has limited active shoulder movement in initial 40* of motion (flexion) RLE Assessment RLE Assessment: Within Functional Limits LLE Assessment LLE Assessment: Exceptions to [facility] General Strength Comments: grossly 3/5 knee, 2/5 hips, although not fully tested 2/2 pain.  Care Tool Care Tool Bed Mobility Roll left and right activity   Roll left and right assist level: Minimal Assistance - Patient > 75%    Sit to lying activity   Sit to lying assist level: Moderate Assistance - Patient 50 - 74%    Lying to sitting on side of bed activity   Lying to sitting on side of bed assist level: the ability to move from lying on the back to sitting on the side of the bed with no back support.: Moderate Assistance - Patient 50 - 74%     Care Tool Transfers Sit to stand transfer   Sit to stand assist level: Moderate Assistance - Patient 50 - 74%    Chair/bed transfer   Chair/bed transfer  assist level: Maximal Assistance - Patient 25 - 49%    Car transfer Car transfer activity did not occur: Safety/medical concerns        Care Tool Locomotion Ambulation   Assist level: Maximal Assistance - Patient 25 - 49% Assistive device: Walker-rolling Max distance: 3  Walk 10 feet activity Walk 10 feet activity did not occur: Safety/medical concerns       Walk 50 feet with 2 turns activity Walk 50 feet with 2 turns activity did not occur: Safety/medical concerns      Walk 150 feet activity Walk 150 feet activity did not occur: Safety/medical concerns      Walk 10 feet on uneven surfaces activity Walk 10 feet on uneven surfaces activity did not occur: Safety/medical concerns      Stairs Stair activity did not occur: Safety/medical concerns        Walk up/down 1 step activity Walk up/down 1 step or curb (drop down) activity did not occur: Safety/medical concerns      Walk up/down 4 steps activity Walk up/down 4 steps activity did not occur: Safety/medical concerns      Walk up/down 12 steps activity Walk up/down 12 steps activity did not occur: Safety/medical concerns      Pick up small objects from floor Pick up small object from the floor (from standing position) activity did not occur: Safety/medical concerns      Wheelchair Is the patient using a wheelchair?: Yes Type of Wheelchair: Manual   Wheelchair assist level: Supervision/Verbal cueing Max wheelchair distance: 25  Wheel 50 feet with 2 turns activity   Assist Level: Moderate Assistance - Patient 50 - 74%  Wheel 150 feet activity   Assist Level: Dependent - Patient 0%    Refer to Care Plan for Long Term Goals  SHORT TERM GOAL WEEK 1 PT Short Term Goal 1 (Week 1): Pt will transfer sup to sit w/ min A PT Short Term Goal 2 (Week 1): Pt will transfer sit to stand w/ min A consistently. PT Short Term Goal 3 (Week 1): Pt will amb x 25' w/ RW and min A  Recommendations for other services: None   Skilled  Therapeutic Intervention Evaluation completed (see details above and below) with education on PT POC and goals and individual treatment initiated with focus on  strengthening, transfers, gait, endurance.  Pt presents seated in w/c and agreeable to therapy.  Pt transfers  sit to stand w/ mod A and cues for hand placement.  Pt able to perform w/c > bed transfer w/ max A and RW, cues for maintaining upright stance to utilize UE strength for minimal RLE foot clearance 2/2 10/10 pain LLE.  Pt required mod A for sit <>supine.  Pt amb x 3' w/ RW and max A and constant cues for foot clearance and posture.  Pt negotiated w/c w/ supervision x 25' before fatiguing.  Pt remained sitting in w/c w/ chair alarm on and all needs in reach, family member present.   Mobility Bed Mobility Bed Mobility: Rolling Right;Rolling Left;Supine to Sit;Sit to Supine Rolling Right: Minimal Assistance - Patient > 75% Rolling Left: Minimal Assistance - Patient > 75% Supine to Sit: Moderate Assistance - Patient 50-74% Sit to Supine: Moderate Assistance - Patient 50-74% Transfers Transfers: Sit to Stand;Stand to Sit;Stand Pivot Transfers Sit to Stand: Moderate Assistance - Patient 50-74% Stand to Sit: Moderate Assistance - Patient 50-74% Stand Pivot Transfers: Moderate Assistance - Patient 50 - 74% Stand Pivot Transfer Details: Verbal cues for safe use of DME/AE;Verbal cues for precautions/safety;Verbal cues for sequencing Stand Pivot Transfer Details (indicate cue type and reason): for use of UES Transfer (Assistive device): Rolling walker Locomotion  Gait Ambulation: Yes Gait Assistance: Maximal Assistance - Patient 25-49% Gait Distance (Feet): 3 Feet Assistive device: Rolling walker Gait Assistance Details: Verbal cues for safe use of DME/AE;Verbal cues for sequencing;Verbal cues for gait pattern Gait Gait velocity: decreased Stairs / Additional Locomotion Stairs: No Wheelchair Mobility Wheelchair Mobility:  Yes Wheelchair Assistance: Doctor, general practice: Both upper extremities Wheelchair Parts Management: Needs assistance Distance: 25   Discharge Criteria: Patient will be discharged from PT if patient refuses treatment 3 consecutive times without medical reason, if treatment goals not met, if there is a change in medical status, if patient makes no progress towards goals or if patient is discharged from hospital.  The above assessment, treatment plan, treatment alternatives and goals were discussed and mutually agreed upon: by patient and by family  Reyes SHAUNNA Sierra 11/26/2023, 12:33 PM

## 2023-11-26 NOTE — Progress Notes (Signed)
 Inpatient Rehabilitation Center Individual Statement of Services  Patient Name:  [name]  Date:  11/26/2023  Welcome to the Inpatient Rehabilitation Center.  Our goal is to provide you with an individualized program based on your diagnosis and situation, designed to meet your specific needs.  With this comprehensive rehabilitation program, you will be expected to participate in at least 3 hours of rehabilitation therapies Monday-Friday, with modified therapy programming on the weekends.  Your rehabilitation program will include the following services:  Physical Therapy (PT), Occupational Therapy (OT), Speech Therapy (ST), 24 hour per day rehabilitation nursing, Therapeutic Recreaction (TR), Neuropsychology, Care Coordinator, Rehabilitation Medicine, Nutrition Services, and Pharmacy Services  Weekly team conferences will be held on Wednesday to discuss your progress.  Your Inpatient Rehabilitation Care Coordinator will talk with you frequently to get your input and to update you on team discussions.  Team conferences with you and your family in attendance may also be held.  Expected length of stay: 12-14 days  Overall anticipated outcome: independent with device-PT min-OT bathing and dressing  Depending on your progress and recovery, your program may change. Your Inpatient Rehabilitation Care Coordinator will coordinate services and will keep you informed of any changes. Your Inpatient Rehabilitation Care Coordinator's name and contact numbers are listed  below.  The following services may also be recommended but are not provided by the Inpatient Rehabilitation Center:  Driving Evaluations Home Health Rehabiltiation Services Outpatient Rehabilitation Services    Arrangements will be made to provide these services after discharge if needed.  Arrangements include referral to agencies that provide these services.  Your insurance has been verified to be:  [facility] Medicare Your primary doctor  is:  [facility]  Pertinent information will be shared with your doctor and your insurance company.  Inpatient Rehabilitation Care Coordinator:  [name], KEN [phone] or [name]  Information discussed with and copy given to patient by: [name], 11/26/2023, 1:46 PM

## 2023-11-26 NOTE — Plan of Care (Signed)
  Problem: Consults Goal: RH STROKE PATIENT EDUCATION Description: See Patient Education module for education specifics  Outcome: Progressing   Problem: RH BOWEL ELIMINATION Goal: RH STG MANAGE BOWEL WITH ASSISTANCE Description: STG Manage Bowel with mod I  Assistance. Outcome: Progressing Goal: RH STG MANAGE BOWEL W/MEDICATION W/ASSISTANCE Description: STG Manage Bowel with Medication with mod I Assistance. Outcome: Progressing   Problem: RH SAFETY Goal: RH STG ADHERE TO SAFETY PRECAUTIONS W/ASSISTANCE/DEVICE Description: STG Adhere to Safety Precautions With cues Assistance/Device. Outcome: Progressing   Problem: RH PAIN MANAGEMENT Goal: RH STG PAIN MANAGED AT OR BELOW PT'S PAIN GOAL Description: Pain < 4 with prns Outcome: Progressing   Problem: RH KNOWLEDGE DEFICIT Goal: RH STG INCREASE KNOWLEDGE OF DIABETES Description: Patient and family will be able to manage DM using educational resources for medications and dietary modification independently Outcome: Progressing Goal: RH STG INCREASE KNOWLEDGE OF HYPERTENSION Description: Patient and family will be able to manage HTN using educational resources for medications and dietary modification independently Outcome: Progressing Goal: RH STG INCREASE KNOWLEGDE OF HYPERLIPIDEMIA Description: Patient and family will be able to manage HLD using educational resources for medications and dietary modification independently Outcome: Progressing Goal: RH STG INCREASE KNOWLEDGE OF STROKE PROPHYLAXIS Description: Patient and family will be able to manage secondary risks using educational resources for medications and dietary modification independently Outcome: Progressing

## 2023-11-27 ENCOUNTER — Inpatient Hospital Stay (HOSPITAL_COMMUNITY)

## 2023-11-27 DIAGNOSIS — I63411 Cerebral infarction due to embolism of right middle cerebral artery: Secondary | ICD-10-CM | POA: Diagnosis not present

## 2023-11-27 DIAGNOSIS — M25512 Pain in left shoulder: Secondary | ICD-10-CM | POA: Diagnosis not present

## 2023-11-27 LAB — GLUCOSE, CAPILLARY
Glucose-Capillary: 123 mg/dL — ABNORMAL HIGH (ref 70–99)
Glucose-Capillary: 273 mg/dL — ABNORMAL HIGH (ref 70–99)
Glucose-Capillary: 274 mg/dL — ABNORMAL HIGH (ref 70–99)
Glucose-Capillary: 306 mg/dL — ABNORMAL HIGH (ref 70–99)
Glucose-Capillary: 310 mg/dL — ABNORMAL HIGH (ref 70–99)

## 2023-11-27 MED ORDER — INSULIN ASPART 100 UNIT/ML IJ SOLN
0.0000 [IU] | Freq: Three times a day (TID) | INTRAMUSCULAR | Status: DC
  Administered 2023-11-27: 5 [IU] via SUBCUTANEOUS
  Administered 2023-11-27 – 2023-11-28 (×2): 7 [IU] via SUBCUTANEOUS
  Administered 2023-11-28: 5 [IU] via SUBCUTANEOUS
  Administered 2023-11-28: 2 [IU] via SUBCUTANEOUS
  Administered 2023-11-28: 7 [IU] via SUBCUTANEOUS
  Administered 2023-11-29: 5 [IU] via SUBCUTANEOUS
  Administered 2023-11-29: 7 [IU] via SUBCUTANEOUS
  Administered 2023-11-29 (×2): 1 [IU] via SUBCUTANEOUS
  Administered 2023-11-30: 5 [IU] via SUBCUTANEOUS
  Administered 2023-11-30: 3 [IU] via SUBCUTANEOUS
  Administered 2023-11-30: 2 [IU] via SUBCUTANEOUS
  Administered 2023-11-30: 1 [IU] via SUBCUTANEOUS
  Administered 2023-12-01: 3 [IU] via SUBCUTANEOUS
  Administered 2023-12-01 (×2): 5 [IU] via SUBCUTANEOUS
  Administered 2023-12-01: 1 [IU] via SUBCUTANEOUS
  Administered 2023-12-02 (×3): 3 [IU] via SUBCUTANEOUS
  Administered 2023-12-02: 5 [IU] via SUBCUTANEOUS
  Administered 2023-12-03 (×2): 2 [IU] via SUBCUTANEOUS
  Administered 2023-12-03: 1 [IU] via SUBCUTANEOUS
  Administered 2023-12-04: 2 [IU] via SUBCUTANEOUS
  Administered 2023-12-04 – 2023-12-05 (×3): 1 [IU] via SUBCUTANEOUS
  Administered 2023-12-05 – 2023-12-06 (×2): 3 [IU] via SUBCUTANEOUS
  Administered 2023-12-06: 1 [IU] via SUBCUTANEOUS
  Administered 2023-12-06: 2 [IU] via SUBCUTANEOUS
  Administered 2023-12-06: 1 [IU] via SUBCUTANEOUS
  Administered 2023-12-07: 3 [IU] via SUBCUTANEOUS
  Administered 2023-12-07: 2 [IU] via SUBCUTANEOUS
  Administered 2023-12-07: 1 [IU] via SUBCUTANEOUS
  Administered 2023-12-07 – 2023-12-08 (×2): 3 [IU] via SUBCUTANEOUS
  Administered 2023-12-08: 2 [IU] via SUBCUTANEOUS
  Administered 2023-12-08: 5 [IU] via SUBCUTANEOUS
  Administered 2023-12-08 – 2023-12-09 (×4): 2 [IU] via SUBCUTANEOUS
  Administered 2023-12-09: 3 [IU] via SUBCUTANEOUS
  Administered 2023-12-10 (×2): 2 [IU] via SUBCUTANEOUS

## 2023-11-27 MED ORDER — METFORMIN HCL 500 MG PO TABS
500.0000 mg | ORAL_TABLET | Freq: Every day | ORAL | Status: DC
  Administered 2023-11-28 – 2023-12-01 (×4): 500 mg via ORAL
  Filled 2023-11-27 (×4): qty 1

## 2023-11-27 NOTE — Progress Notes (Signed)
 Speech Language Pathology Daily Session Note  Patient Details  Name: [name] MRN: [id number] Date of Birth: [birth year]  Today's Date: 11/27/2023 SLP Individual Time: 9049-8965 SLP Individual Time Calculation (min): 44 min  Short Term Goals: Week 1: SLP Short Term Goal 1 (Week 1): Patient will demonstrate sustained attention during functional tasks for >5 minutes given mod multimodal A SLP Short Term Goal 2 (Week 1): Patient will recall new, daily information given mod multimodal A SLP Short Term Goal 3 (Week 1): Patient will demonstrate simple problem solving skills during functional tasks given min multimodal A SLP Short Term Goal 4 (Week 1): Patient will demonstrate emergent awareness of mistakes given min verbal A  Skilled Therapeutic Interventions: Skilled therapy session focused on cognitive goals. SLP facilitated session by educating patient on memory strategies (WRAP: write, repeat, associate, picture) and providing examples of each. SLP also provided patient with memory book and educated on use. Patient completed book for todays session with minA. Patient oriented to self, location and time, though required re-education on CVA dx (patient just recalled fx femur). Patient left in chair with alarm set and call bell in reach. Continue POC   Pain Constant 7/10 leg pain  Therapy/Group: Individual Therapy  Fifi Schindler M.A., CCC-SLP 11/27/2023, 7:41 AM

## 2023-11-27 NOTE — Progress Notes (Signed)
 Occupational Therapy Session Note  Patient Details  Name: [name] MRN: [id number] Date of Birth: Jan 14, 1951  Today's Date: 11/27/2023 OT Individual Time: 0848-0949+1415-1510( 55 mins) OT Individual Time Calculation (min): 61 min    Short Term Goals: Week 1:  OT Short Term Goal 1 (Week 1): Patient will complete toilet transfer with mod assist OT Short Term Goal 2 (Week 1): Patient will don pants with mod assist OT Short Term Goal 3 (Week 1): Patient will complete toileting with mod assist  Skilled Therapeutic Interventions/Progress Updates:  Session 1: Pt greeted supine in bed, pt agreeable to OT intervention.    Unrated pain reported during session, pain meds provided during session.   Transfers/bed mobility/functional mobility:  Pt completed supine>sit with MODA to manage LLE and elevate trunk. Pt completed sit>stands with MINA, MODA to pivot to w/c with RW as pt heavily leaning on RW needing w/c to be pulled up early.    ADLs:  Grooming: pt completed seated grooming at sink with supervision.  UB dressing:pt donned [place] shirt with set- up assist  LB dressing: pt donned pants and brief with MAX A Footwear: pt donned socks with total A, decreased ability to reach to feet d/t pain.   Bathing: pt completed LB bathing in standing with MAX A d/t incontinent urine void. Pt unable to access anterior/posterior periarea d/t shoulder pain.                Ended session with pt seated in w/c with all needs within reach and safety belt alarm activated.            Session 2: Pt greeted seated in w/c, pt agreeable to OT intervention.    Pt reports 10/10 pain during session, rest breaks provided and asked nurse for pain meds.  Pts friend cheryl present. Channing provided information that pt needs to be independent at DC as pt was the caregiver to her bed bound mother. Spent time discussing available assistance for pt, pts son is there but works 2 pm- 9pm. Pts brother comes in at night but to  assist with pts mother.     Transfers/bed mobility/functional mobility:  Worked on sit>stands at parallel bars with pt needing MODA to pull up into standing with pt preferring to not put weight through LLE despite encouragement.   Utilized stedy for back to bed transfer d/t pain, pt able to stand to stedy with MINA +2, dependent transfer back to bed. Pt needed MODA to elevate BLEs back to bed. Pt able to assist with pulling up in bed with bed in reverse trendelenburg.    Cognition: filled out memory book at end of session with pt reporting that we cooked breakfast during session. Also noted pt asking her friend why she couldn't have a cigarette. Pt unable to recall why she was in the hospital, showed pt her xrays from hip surgery to increase carryover.                 Ended session with pt supine in bed with all needs within reach and bed alarm activated.                            Therapy Documentation Precautions:  Precautions Precautions: Fall Recall of Precautions/Restrictions: Impaired Restrictions Weight Bearing Restrictions Per Provider Order: Yes LLE Weight Bearing Per Provider Order: Weight bearing as tolerated Other Position/Activity Restrictions: Patient has a boil on her buttocks that limits ability to  sit   Therapy/Group: Individual Therapy  Ronal Gift [facility] 11/27/2023, 12:01 PM

## 2023-11-27 NOTE — Progress Notes (Signed)
 Physical Therapy Session Note  Patient Details  Name: [name] MRN: [id number] Date of Birth: 20-Dec-1950  Today's Date: 11/27/2023 PT Individual Time: 1103-1158 PT Individual Time Calculation (min): 55 min   Short Term Goals: Week 1:  PT Short Term Goal 1 (Week 1): Pt will transfer sup to sit w/ min A PT Short Term Goal 2 (Week 1): Pt will transfer sit to stand w/ min A consistently. PT Short Term Goal 3 (Week 1): Pt will amb x 25' w/ RW and min A  Skilled Therapeutic Interventions/Progress Updates: Patient sitting in WC on entrance to room. Patient alert and agreeable to PT session.   Patient reported 8/10 pain on L thigh/knee, and that the room is spinning while sitting in WC. PTA cued pt to follow finger horizontally/vertically with pt stating it triggered dizziness (noted decreased smooth pursuit in all directions looked at). Pt stated vertical follow triggered vertigo faster (pt reported that when she fell prior to admission, she felt like she was falling backwards vs the room spinning horizontally). Pt educated on habituating interventions during therapy sessions, and to locate vertical lines at eye level to orient self to upright position. Pt transported from room<>day room gym in [facility]. PTA assisted with PROM of L LE through hip flexion with cues/education provided for pt to breathe through pain (pt also distracted by giving past history of personal hobbies). Pt then guided through PROM of L knee extension/flexion with pt presenting with increased tension on knee flexors which made it difficult to achieve extension. Nsg notified at end of session to provide medication if able. Pt guided through gaze stabilization intervention with pt requiring increased multimodal cuing on sequence. Pt reported decreased peripheral ability to keep eye on finger when turning head to L vs R. Objectively, pt presented with decreased peripheral field on R vs L when cued to say now when pt saw green squig.  Pt guided through tracking objects on wall with LED laser headlamp to habituate self to head turns with pt stating increase in vertigo with vertical turns vs horizontal (pt sitting in WC). Pt cued to state when 7/10 dizziness and to rest (one occurrence). Pt performed sit<>stand from WC<RW with modA + 2 for safety due to pt pain in L LE. Pt stood in RW and cued to perform same head turns to objects on wall with pt reporting increase in vertigo when PTA giving distractions on L side and required seated rest break.   Patient sitting in WC at end of session with brakes locked, belt alarm set, and all needs within reach.      Therapy Documentation Precautions:  Precautions Precautions: Fall Recall of Precautions/Restrictions: Impaired Restrictions Weight Bearing Restrictions Per Provider Order: Yes LLE Weight Bearing Per Provider Order: Weight bearing as tolerated Other Position/Activity Restrictions: Patient has a boil on her buttocks that limits ability to sit   Therapy/Group: Individual Therapy  Chana Lindstrom PTA 11/27/2023, 12:13 PM

## 2023-11-27 NOTE — Progress Notes (Signed)
 PROGRESS NOTE   Subjective/Complaints:  No issues overnite , pt not aware whether she is taking pain meds lots of meds in cup ROS- neg CP, SOB, N/V/D Objective:   VAS US  LOWER EXTREMITY VENOUS (DVT) Result Date: 11/25/2023  Lower Venous DVT Study Patient Name:  [name] Barnard  Date of Exam:   11/25/2023 Medical Rec #: [id number]         Accession #:    [id number] Date of Birth: [birth year]         Patient Gender: F Patient Age:   73 years Exam Location:  [facility] Procedure:      VAS US  LOWER EXTREMITY VENOUS (DVT) Referring Phys: TORIBIO PITCH --------------------------------------------------------------------------------  Indications: Swelling, and Fracture left hip 11/19/23-.  Comparison Study: No priors. Performing Technologist: Ricka Sturdivant-Jones RDMS, RVT  Examination Guidelines: A complete evaluation includes B-mode imaging, spectral Doppler, color Doppler, and power Doppler as needed of all accessible portions of each vessel. Bilateral testing is considered an integral part of a complete examination. Limited examinations for reoccurring indications may be performed as noted. The reflux portion of the exam is performed with the patient in reverse Trendelenburg.  +---------+---------------+---------+-----------+----------+--------------+ RIGHT    CompressibilityPhasicitySpontaneityPropertiesThrombus Aging +---------+---------------+---------+-----------+----------+--------------+ CFV      Full           Yes      Yes                                 +---------+---------------+---------+-----------+----------+--------------+ SFJ      Full                                                        +---------+---------------+---------+-----------+----------+--------------+ FV Prox  Full                                                         +---------+---------------+---------+-----------+----------+--------------+ FV Mid   Full           Yes      Yes                                 +---------+---------------+---------+-----------+----------+--------------+ FV DistalFull                                                        +---------+---------------+---------+-----------+----------+--------------+ PFV      Full                                                        +---------+---------------+---------+-----------+----------+--------------+  POP      Full           Yes      Yes                                 +---------+---------------+---------+-----------+----------+--------------+ PTV      Full                                                        +---------+---------------+---------+-----------+----------+--------------+ PERO     Full                                                        +---------+---------------+---------+-----------+----------+--------------+   +---------+---------------+---------+-----------+----------+--------------+ LEFT     CompressibilityPhasicitySpontaneityPropertiesThrombus Aging +---------+---------------+---------+-----------+----------+--------------+ CFV      Full           Yes      Yes                                 +---------+---------------+---------+-----------+----------+--------------+ SFJ      Full                                                        +---------+---------------+---------+-----------+----------+--------------+ FV Prox  Full                                                        +---------+---------------+---------+-----------+----------+--------------+ FV Mid   Full           Yes      Yes                                 +---------+---------------+---------+-----------+----------+--------------+ FV DistalFull                                                         +---------+---------------+---------+-----------+----------+--------------+ PFV      Full                                                        +---------+---------------+---------+-----------+----------+--------------+ POP      Full           Yes      Yes                                 +---------+---------------+---------+-----------+----------+--------------+  PTV      Full                                                        +---------+---------------+---------+-----------+----------+--------------+ PERO     Full                                                        +---------+---------------+---------+-----------+----------+--------------+     Summary: BILATERAL: - No evidence of deep vein thrombosis seen in the lower extremities, bilaterally. -No evidence of popliteal cyst, bilaterally.   *See table(s) above for measurements and observations. Electronically signed by Debby Robertson on 11/25/2023 at 4:48:46 PM.    Final    Recent Labs    11/26/23 0452  WBC 7.0  HGB 9.8*  HCT 30.2*  PLT 232   Recent Labs    11/26/23 0452  NA 135  K 4.3  CL 99  CO2 26  GLUCOSE 133*  BUN 15  CREATININE 1.18*  CALCIUM  9.0    Intake/Output Summary (Last 24 hours) at 11/27/2023 0848 Last data filed at 11/26/2023 1520 Gross per 24 hour  Intake 120 ml  Output 18 ml  Net 102 ml        Physical Exam: Vital Signs Blood pressure (!) 148/79, pulse 86, temperature 99 F (37.2 C), temperature source Oral, resp. rate 18, height 5' 4 (1.626 m), weight 73.9 kg, SpO2 95%.   General: No acute distress Mood and affect are appropriate Heart: Regular rate and rhythm no rubs murmurs or extra sounds Lungs: Clear to auscultation, breathing unlabored, no rales or wheezes Abdomen: Positive bowel sounds, soft nontender to palpation, nondistended Extremities: No clubbing, cyanosis, or edema Skin: No evidence of breakdown, no evidence of rash Neurologic: Cranial nerves II through XII  intact, motor strength is 5/5 in right deltoid, bicep, tricep, grip, hip flexor, knee extensors, ankle dorsiflexor and plantar flexor Left delt and biceps limited by pain but at least 3-/5  normal grip, WF, WE,  LLE limited by pain for HF, KE, 4/5 ankle PF and DF Sensory exam normal sensation to light touch and proprioception in bilateral upper and lower extremities  Musculoskeletal: Full range of motion in all 4 extremities. No joint swelling   Assessment/Plan: 1. Functional deficits which require 3+ hours per day of interdisciplinary therapy in a comprehensive inpatient rehab setting. Physiatrist is providing close team supervision and 24 hour management of active medical problems listed below. Physiatrist and rehab team continue to assess barriers to discharge/monitor patient progress toward functional and medical goals  Care Tool:  Bathing        Body parts bathed by helper: Right arm, Left arm, Chest, Abdomen, Front perineal area, Buttocks, Right upper leg, Left upper leg, Right lower leg, Left lower leg, Face     Bathing assist Assist Level: Total Assistance - Patient < 25% (nursing bath prior to therapy session)     Upper Body Dressing/Undressing Upper body dressing   What is the patient wearing?: Bra, Pull over shirt    Upper body assist Assist Level: Maximal Assistance - Patient 25 - 49%    Lower Body Dressing/Undressing Lower body dressing  What is the patient wearing?: Incontinence brief, Pants     Lower body assist Assist for lower body dressing: Total Assistance - Patient < 25%     Toileting Toileting    Toileting assist Assist for toileting: Dependent - Patient 0%     Transfers Chair/bed transfer  Transfers assist     Chair/bed transfer assist level: Moderate Assistance - Patient 50 - 74%     Locomotion Ambulation   Ambulation assist      Assist level: Maximal Assistance - Patient 25 - 49% Assistive device: Walker-rolling Max  distance: 3   Walk 10 feet activity   Assist  Walk 10 feet activity did not occur: Safety/medical concerns        Walk 50 feet activity   Assist Walk 50 feet with 2 turns activity did not occur: Safety/medical concerns         Walk 150 feet activity   Assist Walk 150 feet activity did not occur: Safety/medical concerns         Walk 10 feet on uneven surface  activity   Assist Walk 10 feet on uneven surfaces activity did not occur: Safety/medical concerns         Wheelchair     Assist Is the patient using a wheelchair?: Yes Type of Wheelchair: Manual    Wheelchair assist level: Supervision/Verbal cueing Max wheelchair distance: 25    Wheelchair 50 feet with 2 turns activity    Assist        Assist Level: Moderate Assistance - Patient 50 - 74%   Wheelchair 150 feet activity     Assist      Assist Level: Dependent - Patient 0%   Blood pressure (!) 148/79, pulse 86, temperature 99 F (37.2 C), temperature source Oral, resp. rate 18, height 5' 4 (1.626 m), weight 73.9 kg, SpO2 95%.  Medical Problem List and Plan: 1. 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  Possible punctate infarct left middle cerebellar peduncle versus artifact.  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             -patient may  shower-cover L hip dressing with occlusive dressing             -ELOS/Goals: 13-15 days- mod I to supervision             Admit to CIR 2.  Antithrombotics: -DVT/anticoagulation:  Pharmaceutical: Lovenox .  Check vascular study             -antiplatelet therapy: Aspirin  81 mg daily and Plavix  75 mg daily 3. Pain Management: Neurontin  800 mg 3 times daily, Voltaren  gel 4 g 4 times daily to affected area, oxycodone  5 mg every 4 hours as needed, tramadol  50 mg every 6 hours as needed, Robaxin   500 mg every 8 hours as needed- of note, doesn't like to take meds- encouraged her to do so prior to therapy 4. Mood/Behavior/Sleep: Wellbutrin  150 mg twice daily, trazodone  25 mg nightly as needed             -antipsychotic agents: N/A 5. Neuropsych/cognition: This patient is capable of making decisions on her own behalf. 6. Skin/Wound Care: Routine skin checks 7. Fluids/Electrolytes/Nutrition: Routine in and outs with follow-up chemistries 8.  Left nondisplaced intertrochanteric hip fracture of the proximal left femur.  Status post IM nailing 11/19/2023.  Weightbearing as tolerated 9.  Acute blood loss anemia.  Follow-up CBC 10.  Hypertension.  Norvasc  10 mg daily, Coreg  25 mg twice daily, lisinopril  40 mg daily.  Monitor with increased mobility Vitals:   11/26/23 1938 11/27/23 0320  BP: 139/65 (!) 148/79  Pulse: 80 86  Resp: 16 18  Temp: 98.3 F (36.8 C) 99 F (37.2 C)  SpO2: 97% 95%    11.  Diabetes mellitus.  Hemoglobin A1c 12.4.  Currently on Lantus  insulin  15 units every 24 hours.  Check blood sugars AC and at bedtime.  Prior to admission patient on Glucophage  500 mg daily, Trulicity weekly CBG (last 3)  Recent Labs    11/26/23 2102 11/27/23 0001 11/27/23 0401  GLUCAP 359* 273* 123*    12.  AKI on CKD stage IIIB.  Follow-up chemistries    Latest Ref Rng & Units 11/26/2023    4:52 AM 11/22/2023    6:22 AM 11/21/2023    3:25 AM  BMP  Glucose 70 - 99 mg/dL 866  878  842   BUN 8 - 23 mg/dL 15  8  10    Creatinine 0.44 - 1.00 mg/dL 8.81  9.07  9.10   Sodium 135 - 145 mmol/L 135  136  136   Potassium 3.5 - 5.1 mmol/L 4.3  3.4  3.3   Chloride 98 - 111 mmol/L 99  102  101   CO2 22 - 32 mmol/L 26  27  25    Calcium  8.9 - 10.3 mg/dL 9.0  8.4  8.6     13.  Tobacco abuse.  NicoDerm patch.  Provide counseling 14.  Hyperlipidemia.  Lipitor 15.  Constipation.  Adjust bowel program as needed- gave Sorbitol  30cc since lBM 4-5 days ago- if doesn't have good results, suggest Soap Suds  enema.  16. Urinary retention with overflow- ordered bladder scans and to cath q6 hours prn if bladder scan >350cc- also ordered U/A and Cx, and Flomax  0.4 mg q supper -at this time U/A is in process- I ordered in/out caths if volumes >350cc myself  17. Mild spasticity- monitor for symptoms 18. Of note, pt's mother is 14 years old and still doing functionally well! 19. Thrush- ordered nystatin  5ml q6 hours for mild thrush- might need Diflucan- monitor    20.  Left shoulder pain with hx of RCR s/p recent fall - check Xray     LOS: 2 days A FACE TO FACE EVALUATION WAS PERFORMED  [name] 11/27/2023, 8:48 AM

## 2023-11-28 DIAGNOSIS — M25512 Pain in left shoulder: Secondary | ICD-10-CM | POA: Diagnosis not present

## 2023-11-28 DIAGNOSIS — F09 Unspecified mental disorder due to known physiological condition: Secondary | ICD-10-CM

## 2023-11-28 DIAGNOSIS — I63411 Cerebral infarction due to embolism of right middle cerebral artery: Secondary | ICD-10-CM | POA: Diagnosis not present

## 2023-11-28 DIAGNOSIS — I63429 Cerebral infarction due to embolism of unspecified anterior cerebral artery: Secondary | ICD-10-CM | POA: Diagnosis not present

## 2023-11-28 LAB — GLUCOSE, CAPILLARY
Glucose-Capillary: 195 mg/dL — ABNORMAL HIGH (ref 70–99)
Glucose-Capillary: 278 mg/dL — ABNORMAL HIGH (ref 70–99)
Glucose-Capillary: 308 mg/dL — ABNORMAL HIGH (ref 70–99)
Glucose-Capillary: 327 mg/dL — ABNORMAL HIGH (ref 70–99)

## 2023-11-28 MED ORDER — INSULIN GLARGINE 100 UNIT/ML ~~LOC~~ SOLN
20.0000 [IU] | SUBCUTANEOUS | Status: DC
  Administered 2023-11-28 – 2023-12-10 (×13): 20 [IU] via SUBCUTANEOUS
  Filled 2023-11-28 (×14): qty 0.2

## 2023-11-28 NOTE — Consult Note (Signed)
 Neuropsychological Consultation Comprehensive Inpatient Rehab   Patient:   [name]   DOB:   Dec 02, 1950  MR Number:  [id number]  Location:  [facility] [address] CENTER A [address] [place] [address] Dept: [phone] Loc: [phone]           Date of Service:   11/28/2023  Start Time:   11:30 AM End Time:   12:30 PM  Provider/Observer:  Norleen Asa, Psy.D.       Clinical Neuropsychologist       Billing Code/Service: [phone]  Reason for Service:    [name] is a 73 year old right-handed female referred for neuropsychological consultation during admission to the comprehensive inpatient rehabilitation unit for evaluation of new cognitive changes.  HISTORY OF PRESENTING ILLNESS: Admitted to [facility] on 11/19/2023 following an acute onset of vertigo, resulting in a fall with left hip pain. Was unable to ambulate after the fall. Subsequently admitted to inpatient rehabilitation due to decreased functional mobility and cognitive changes post-CVA.  PAST MEDICAL HISTORY: - Diabetes Mellitus (HbA1c 12.4) - Chronic Kidney Disease, Stage 3b - Hypertension - Hyperlipidemia - Intracranial aneurysm (intervention declined 2023 at [facility]) - Tobacco use  HOSPITAL COURSE & IMAGING REVIEW: - Initial cranial CT showed generalized cerebral atrophy, microvascular/small vessel disease changes, and a chronic left basal ganglia lacunar infarct. No acute changes noted. - CT of the cervical spine was negative. - CT of the left hip revealed an acute, nondisplaced fracture of the proximal left femur. - MRI of the brain showed an acute infarct in the posterior limb of the right internal capsule superiorly. A possible acute infarct in the left middle cerebellar was noted versus artifact. Remote lacunar infarcts were also seen in the pons, left basal ganglia, and right corona radiata. - TNK was not  administered. - Underwent left hip orthopedic surgery. Is currently weight bearing as tolerated on the left lower extremity.   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   Pressure injury of skin 11/25/2023   Closed left hip fracture, initial encounter (HCC) 11/19/2023   Essential hypertension 11/19/2023   Type 2 diabetes mellitus with peripheral neuropathy (HCC) 11/19/2023   Tobacco abuse 11/19/2023   Dyslipidemia 11/19/2023   Type 2 diabetes mellitus with hypoglycemia without coma, with long-term current use of insulin  (HCC) 05/27/2021   Hypertension 05/27/2021   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 her dress was Appropriate and she was Well Groomed and her manners were Appropriate to the situation.  her participation was indicative of Appropriate and Redirectable behaviors.  There were physical disabilities noted.  she displayed an appropriate level of cooperation and motivation.    Interactions:    Active Appropriate  Attention:   within normal limits and attention span and concentration were age appropriate  Memory:   within normal limits; recent and remote memory intact  Visuo-spatial:   not examined  Speech (Volume):  normal  Speech:   normal; normal  Thought  Process:  Coherent and Relevant  Coherent and Linear  Though Content:  WNL; not suicidal and not homicidal  Orientation:   person, place, and  situation  Judgment:   Fair  Planning:   Fair  Affect:    Appropriate  Mood:    Euthymic  Insight:   Good  Intelligence:   normal  Family Med/Psych History: History reviewed. No pertinent family history.   Impression/DX:   The patient was generally oriented with positive mood state.  She was aware that she had had a stroke and was aware of her hip surgery and recalled aspects of her ongoing interventions.  The patient reports that it is been difficult for her to have extended hospital stay but has been generally managing and reports positive mood state.  Patient describes ongoing pain in her hip but is aware this can take time for recovery.  Disposition/Plan:  Today we worked on coping and adjustment issues with history of prior stroke and previous unsuccessful attempts to manage a brain aneurysm and now significant fall with hip fracture.        Electronically Signed   _______________________ Norleen Asa, Psy.D. Clinical Neuropsychologist

## 2023-11-28 NOTE — IPOC Note (Signed)
 Overall Plan of Care [facility]) Patient Details Name: [name] MRN: [id number] DOB: Jul 22, 1950  Admitting Diagnosis: Embolic stroke [facility])  Hospital Problems: Principal Problem:   Embolic stroke [facility]) Active Problems:   Intertrochanteric fracture of left hip (HCC)   Pressure injury of skin   Cognitive and neurobehavioral dysfunction     Functional Problem List: Nursing Endurance, Medication Management, Pain, Safety, Bowel  PT Balance, Sensory, Endurance, Motor, Pain  OT Balance, Behavior, Pain, Cognition, Edema, Safety, Endurance, Motor  SLP Cognition  TR         Basic ADL's: OT Bathing, Dressing, Toileting     Advanced  ADL's: OT       Transfers: PT Bed Mobility, Bed to Chair, Car, Occupational psychologist, Research scientist (life sciences): PT Ambulation, Psychologist, prison and probation services, Stairs     Additional Impairments: OT None  SLP Social Cognition   Problem Solving, Memory, Attention, Awareness  TR      Anticipated Outcomes Item Anticipated Outcome  Self Feeding Independent  Swallowing      Basic self-care  Min assist  Toileting  Min assist   Bathroom Transfers Min assist  Bowel/Bladder  manage bowel w mod I assist  Transfers  Mod I  Locomotion  Mod I  Communication     Cognition  supervision-minA  Pain  PAin < 4 with prns  Safety/Judgment  manage safety w cues   Therapy Plan: PT Intensity: Minimum of 1-2 x/day ,45 to 90 minutes PT Frequency: 5 out of 7 days PT Duration Estimated Length of Stay: 10-14 days OT Intensity: Minimum of 1-2 x/day, 45 to 90 minutes OT Frequency: 5 out of 7 days OT Duration/Estimated Length of Stay: 12-14 days SLP Intensity: Minumum of 1-2 x/day, 30 to 90 minutes SLP Frequency: 3 to 5 out of 7 days SLP Duration/Estimated Length of Stay: 10-14 days   Team Interventions: Nursing Interventions Patient/Family Education, Medication Management, Bowel Management, Disease Management/Prevention, Pain Management, Discharge Planning   PT interventions Ambulation/gait training, Community reintegration, Neuromuscular re-education, Stair training, UE/LE Strength taining/ROM, Wheelchair propulsion/positioning, Pain management, Discharge planning, Therapeutic Activities, UE/LE Coordination activities, Therapeutic Exercise, Patient/family education, Functional mobility training  OT Interventions Warden/ranger, Disease mangement/prevention, Patient/family education, Self Care/advanced ADL retraining, Therapeutic Exercise, UE/LE Coordination activities, Wheelchair propulsion/positioning, UE/LE Strength taining/ROM, Therapeutic Activities, Psychosocial support, Pain management, Functional mobility training, DME/adaptive equipment instruction, Discharge planning, Cognitive remediation/compensation  SLP Interventions Cognitive remediation/compensation, Internal/external aids, Cueing hierarchy, Functional tasks, Patient/family education, Therapeutic Activities  TR Interventions    SW/CM Interventions Discharge Planning, Psychosocial Support, Patient/Family Education   Barriers to Discharge MD  Medical stability  Nursing Decreased caregiver support 1 level ramped entry, pt has been using her mom's w/c in community at times.  Pt's brother is in/out during the day; pt's son works 10:30 am to 9 pm.  Pt (and pt's family) assist with caregiving for her mother  PT Other (comments) increased pain w/ decreased ability to WB LLE.  OT Medication compliance    SLP      SW Decreased caregiver support, Insurance for SNF coverage, Lack of/limited family support     Team Discharge Planning: Destination: PT-Home ,OT- Home , SLP-Home Projected Follow-up: PT-Home health PT, OT-  Home health OT, SLP-Home Health SLP, Outpatient SLP Projected Equipment Needs: PT-To be determined, OT- To be determined, SLP-None recommended by SLP Equipment Details: PT-pt has SPC, RW and use of w/c from mother., OT-  Patient/family involved in discharge  planning: PT- Patient, Family member/caregiver,  OT-Patient,  SLP-Patient, Family member/caregiver  MD ELOS: 12-14d Medical Rehab Prognosis:  Good Assessment: The patient has been admitted for CIR therapies with the diagnosis of embolic stroke. The team will be addressing functional mobility, strength, stamina, balance, safety, adaptive techniques and equipment, self-care, bowel and bladder mgt, patient and caregiver education, anemia on anticoagulation . Goals have been set at [facility]- Mod I. Anticipated discharge destination is Home .  See Team Conference Notes for weekly updates to the plan of care

## 2023-11-28 NOTE — Progress Notes (Signed)
 PROGRESS NOTE   Subjective/Complaints:  No issues overnite other than dysuria dn freq , left shoulder pain better  Recent UA  C and S neg but increasing symptoms   ROS- neg CP, SOB, N/V/D Objective:   DG Shoulder Left Result Date: 11/27/2023 CLINICAL DATA:  Left shoulder pain.  History of rotator cuff injury. EXAM: LEFT SHOULDER - 2+ VIEW COMPARISON:  None Available. FINDINGS: There is no evidence of fracture or dislocation. Mild degenerative changes are seen involving the left acromioclavicular and glenohumeral joints. Soft tissues are unremarkable. IMPRESSION: Mild degenerative joint disease. No acute abnormality seen. Electronically Signed   By: Lynwood Landy Raddle M.D.   On: 11/27/2023 12:57   Recent Labs    11/26/23 0452  WBC 7.0  HGB 9.8*  HCT 30.2*  PLT 232   Recent Labs    11/26/23 0452  NA 135  K 4.3  CL 99  CO2 26  GLUCOSE 133*  BUN 15  CREATININE 1.18*  CALCIUM  9.0    Intake/Output Summary (Last 24 hours) at 11/28/2023 0902 Last data filed at 11/28/2023 0600 Gross per 24 hour  Intake 177 ml  Output 100 ml  Net 77 ml        Physical Exam: Vital Signs Blood pressure 125/64, pulse 85, temperature 98 F (36.7 C), resp. rate 18, height 5' 4 (1.626 m), weight 73.9 kg, SpO2 93%.   General: No acute distress Mood and affect are appropriate Heart: Regular rate and rhythm no rubs murmurs or extra sounds Lungs: Clear to auscultation, breathing unlabored, no rales or wheezes Abdomen: Positive bowel sounds, soft nontender to palpation, nondistended Extremities: No clubbing, cyanosis, or edema Skin: No evidence of breakdown, no evidence of rash Neurologic: Cranial nerves II through XII intact, motor strength is 5/5 in right deltoid, bicep, tricep, grip, hip flexor, knee extensors, ankle dorsiflexor and plantar flexor Left delt and biceps limited by pain but at least 3-/5  normal grip, WF, WE,  LLE limited by  pain for HF, KE, 4/5 ankle PF and DF Sensory exam normal sensation to light touch and proprioception in bilateral upper and lower extremities  Musculoskeletal: Reduced ROM Bilateral shoulder abd and flexion , left shoulder 75% range , right shoulder ~50% range, pain at end range  No joint swelling  Left HF, KE limited by pain  Assessment/Plan: 1. Functional deficits which require 3+ hours per day of interdisciplinary therapy in a comprehensive inpatient rehab setting. Physiatrist is providing close team supervision and 24 hour management of active medical problems listed below. Physiatrist and rehab team continue to assess barriers to discharge/monitor patient progress toward functional and medical goals  Care Tool:  Bathing        Body parts bathed by helper: Right arm, Left arm, Chest, Abdomen, Front perineal area, Buttocks, Right upper leg, Left upper leg, Right lower leg, Left lower leg, Face     Bathing assist Assist Level: Total Assistance - Patient < 25% (nursing bath prior to therapy session)     Upper Body Dressing/Undressing Upper body dressing   What is the patient wearing?: Pull over shirt    Upper body assist Assist Level: Set up assist  Lower Body Dressing/Undressing Lower body dressing      What is the patient wearing?: Incontinence brief, Pants     Lower body assist Assist for lower body dressing: Maximal Assistance - Patient 25 - 49%     Toileting Toileting    Toileting assist Assist for toileting: Dependent - Patient 0%     Transfers Chair/bed transfer  Transfers assist     Chair/bed transfer assist level: Moderate Assistance - Patient 50 - 74%     Locomotion Ambulation   Ambulation assist      Assist level: Maximal Assistance - Patient 25 - 49% Assistive device: Walker-rolling Max distance: 3   Walk 10 feet activity   Assist  Walk 10 feet activity did not occur: Safety/medical concerns        Walk 50 feet  activity   Assist Walk 50 feet with 2 turns activity did not occur: Safety/medical concerns         Walk 150 feet activity   Assist Walk 150 feet activity did not occur: Safety/medical concerns         Walk 10 feet on uneven surface  activity   Assist Walk 10 feet on uneven surfaces activity did not occur: Safety/medical concerns         Wheelchair     Assist Is the patient using a wheelchair?: Yes Type of Wheelchair: Manual    Wheelchair assist level: Supervision/Verbal cueing Max wheelchair distance: 25    Wheelchair 50 feet with 2 turns activity    Assist        Assist Level: Moderate Assistance - Patient 50 - 74%   Wheelchair 150 feet activity     Assist      Assist Level: Dependent - Patient 0%   Blood pressure 125/64, pulse 85, temperature 98 F (36.7 C), resp. rate 18, height 5' 4 (1.626 m), weight 73.9 kg, SpO2 93%.  Medical Problem List and Plan: 1. 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  Possible punctate infarct left middle cerebellar peduncle versus artifact.  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             -patient may  shower-cover L hip dressing with occlusive dressing             -ELOS/Goals: 13-15 days- mod I to supervision             Admit to CIR 2.  Antithrombotics: -DVT/anticoagulation:  Pharmaceutical: Lovenox .  Check vascular study             -antiplatelet therapy: Aspirin  81 mg daily and Plavix  75 mg daily 3. Pain Management: Neurontin  800 mg 3 times daily, Voltaren  gel 4 g 4 times daily to affected area, oxycodone  5 mg every 4 hours as needed, tramadol  50 mg every 6 hours as needed, Robaxin  500 mg every 8 hours as needed- of note, doesn't like to take meds- encouraged her to do so prior to therapy 4. Mood/Behavior/Sleep: Wellbutrin  150 mg twice daily,  trazodone  25 mg nightly as needed             -antipsychotic agents: N/A 5. Neuropsych/cognition: This patient is capable of making decisions on her own behalf. 6. Skin/Wound Care: Routine skin checks 7. Fluids/Electrolytes/Nutrition: Routine in and outs with follow-up chemistries 8.  Left nondisplaced intertrochanteric hip fracture of the proximal left femur.  Status post IM  nailing 11/19/2023.  Weightbearing as tolerated 9.  Acute blood loss anemia.  Follow-up CBC 10.  Hypertension.  Norvasc  10 mg daily, Coreg  25 mg twice daily, lisinopril  40 mg daily.  Monitor with increased mobility Vitals:   11/27/23 2001 11/28/23 0452  BP: 126/68 125/64  Pulse: 75 85  Resp: 18 18  Temp: 98.2 F (36.8 C) 98 F (36.7 C)  SpO2: 98% 93%    11.  Diabetes mellitus.  Hemoglobin A1c 12.4.  Currently on Lantus  insulin  15 units every 24 hours.  Check blood sugars AC and at bedtime.  Prior to admission patient on Glucophage  500 mg daily, Trulicity weekly CBG (last 3)  Recent Labs    11/27/23 1637 11/27/23 2114 11/28/23 0617  GLUCAP 274* 306* 195*  Increase Lantus  to 20U 9/5 Metformin  started 9/5  12.  AKI on CKD stage IIIB.  Follow-up chemistries    Latest Ref Rng & Units 11/26/2023    4:52 AM 11/22/2023    6:22 AM 11/21/2023    3:25 AM  BMP  Glucose 70 - 99 mg/dL 866  878  842   BUN 8 - 23 mg/dL 15  8  10    Creatinine 0.44 - 1.00 mg/dL 8.81  9.07  9.10   Sodium 135 - 145 mmol/L 135  136  136   Potassium 3.5 - 5.1 mmol/L 4.3  3.4  3.3   Chloride 98 - 111 mmol/L 99  102  101   CO2 22 - 32 mmol/L 26  27  25    Calcium  8.9 - 10.3 mg/dL 9.0  8.4  8.6     13.  Tobacco abuse.  NicoDerm patch.  Provide counseling 14.  Hyperlipidemia.  Lipitor 15.  Constipation.  Adjust bowel program as needed- gave Sorbitol  30cc since lBM 4-5 days ago- if doesn't have good results, suggest Soap Suds enema.  16. Urinary retention with overflow- ordered bladder scans and to cath q6 hours prn if bladder scan >350cc- also  ordered U/A and Cx, and Flomax  0.4 mg q supper -at this time U/A is in process- I ordered in/out caths if volumes >350cc UA C and S neg 9/3 will repeat given escalating sx , if neg consider pyridium 17. Mild spasticity- monitor for symptoms 18. Of note, pt's mother is 47 years old and still doing functionally well! 19. Thrush- ordered nystatin  5ml q6 hours for mild thrush- might need Diflucan- monitor    20.  Left shoulder pain with hx of RCR s/p recent fall -  Xray with mild OA, pain improving with time/PT,OT    LOS: 3 days A FACE TO FACE EVALUATION WAS PERFORMED  Prentice FORBES Compton 11/28/2023, 9:02 AM

## 2023-11-28 NOTE — Progress Notes (Signed)
 Speech Language Pathology Daily Session Note  Patient Details  Name: Gayathri Futrell MRN: [id number] Date of Birth: 1950-09-07  Today's Date: 11/28/2023 SLP Individual Time: 1230-1328 SLP Individual Time Calculation (min): 58 min  Short Term Goals: Week 1: SLP Short Term Goal 1 (Week 1): Patient will demonstrate sustained attention during functional tasks for >5 minutes given mod multimodal A SLP Short Term Goal 2 (Week 1): Patient will recall new, daily information given mod multimodal A SLP Short Term Goal 3 (Week 1): Patient will demonstrate simple problem solving skills during functional tasks given min multimodal A SLP Short Term Goal 4 (Week 1): Patient will demonstrate emergent awareness of mistakes given min verbal A  Skilled Therapeutic Interventions: Skilled therapy session focused on cognitive goals. SLP facilitated session by prompting patient to recall information re: activities completed in therapy this AM. Patient able to recall completing PT/OT, though not sure what she did. SLP encouraged use of memory book to aid in recall. Patient independently oriented to self, situation, location and time this date, though unable to recall memory strategies discussed yesterday SLP encouraged patient to teach-back of strategies (patient used to be a Runner, broadcasting/film/video). SLP re-educated. SLP targeted problem solving goals through money management task. Patient counted change according to verbalized amount given supervisionA. Patient left in chair with alarm set and call bell in reach. Continue POC.   Pain 7/10 leg pain - nursing aware  Therapy/Group: Individual Therapy  Angela Vazguez M.A., CCC-SLP 11/28/2023, 7:42 AM

## 2023-11-28 NOTE — Progress Notes (Signed)
 Occupational Therapy Session Note  Patient Details  Name: [name] MRN: [id number] Date of Birth: [birth year]  Today's Date: 11/28/2023 OT Individual Time: 9261-9174 OT Individual Time Calculation (min): 47 min    Short Term Goals: Week 1:  OT Short Term Goal 1 (Week 1): Patient will complete toilet transfer with mod assist OT Short Term Goal 2 (Week 1): Patient will don pants with mod assist OT Short Term Goal 3 (Week 1): Patient will complete toileting with mod assist  Skilled Therapeutic Interventions/Progress Updates:  Pt greeted supine in bed, pt agreeable to OT intervention.      Transfers/bed mobility/functional mobility:  Pt completed supine>sit with an emphasis on hooking LLE for pain mgmt. Pt able to complete bed mobility today with more of MIN A with increased time and assist only needed for trunk mgmt. Pt did need 2 trials to make it to EOB d/t pain.   Walker not in room, therefore for time mgmt utilized stedy for transfers with pt able to stand to stedy with CGA.    ADLs:  Grooming: pt completed seated hair care with set- up assist.  UB dressing: donned [place] shirt with set- up assist.  LB dressing: donned pants with MODA, initiated LB AE training with reacher however pt unable to kickout LLE to utilize reacher effectively d/t pain. Pt stood in stedy with pt able to assist with pulling pants to waist line on R side.  Footwear: donned shoes/socks with total A for time mgmt   Cognition: pt oriented to date month and year but unsure of day of week. Pt able to state 1/3 tasks completed in session while filling out memory book.                 Ended session with pt seated in w/c at sink with all needs within reach, alerted NT to check on pt.       Therapy Documentation Precautions:  Precautions Precautions: Fall Recall of Precautions/Restrictions: Impaired Restrictions Weight Bearing Restrictions Per Provider Order: Yes LLE Weight Bearing Per Provider Order:  Weight bearing as tolerated Other Position/Activity Restrictions: Patient has a boil on her buttocks that limits ability to sit  Pain: 10/10 pain reported in LLE, rest breaks provided and pain meds given during session.    Therapy/Group: Individual Therapy  Ronal Gift [facility] 11/28/2023, 12:08 PM

## 2023-11-28 NOTE — Progress Notes (Signed)
 Physical Therapy Session Note  Patient Details  Name: [name] MRN: [id number] Date of Birth: [birth year]  Today's Date: 11/28/2023 PT Individual Time: 1005-1116 PT Individual Time Calculation (min): 71 min   Short Term Goals: Week 1:  PT Short Term Goal 1 (Week 1): Pt will transfer sup to sit w/ min A PT Short Term Goal 2 (Week 1): Pt will transfer sit to stand w/ min A consistently. PT Short Term Goal 3 (Week 1): Pt will amb x 25' w/ RW and min A  Skilled Therapeutic Interventions/Progress Updates: Patient sitting in [facility] with nsg and rehab tech present on entrance to room. Patient alert and agreeable to PT session.   Patient reported unrated pain in L thigh around IM site. Rest breaks provided to assist as needed and breathing/distractions to further assist. Pt reported increase in dizziness when watching nsg leave room quickly with door shutting (VOR performed)  Therapeutic Activity: Transfers: Pt performed sit<>stands throughout session with min/modA in preparation for functional mobility to RW. Provided VC for hand placement and anterior lean.  Neuromuscular Re-ed: - Pt performed VOR cancellation with PTA moving soccer ball to increase speed with pt reporting increase in vertigo with quick movements that quickly dissipated with rest.  Therapeutic Exercise: Pt performed the following exercises with therapist providing the described cuing and facilitation for improvement. - Pt standing in RW in preparation for ambulation in order to increase WB tolerance on L LE. Pt very hesitant to step with R increased WB on B UE due to increase in pain (pt educated that pain will be prevalent, and to breathe through it as it will be beneficial to the healing process). Pt ambulated total of 6' with 2 rest breaks and increased cuing on WB on B UE's on 2nd round with notable difference to take longer steps.  - Pt performing AAROM on L LE sitting in WC of knee extension (small ROM with L UE  assisting). PTA than assisted with CGA (initially told pt that PTA was providing minA to show pt that she could do movement, and that fear of pain is preventing her from progressing). Pt then performed LAQ to available ROM that would increase after multiple reps. Pt reported pain in back of L knee when trying to extend. PTA performed manual therapy following. - Pt ambulated roughly 5-6' in RW following manual therapy with added distractions. Pt with improved step length and WB on L LE. Pt required min/modA to prevent anterior LOB.  - Pt performed step to 2 step with L LE and B UE support on RW and VC for weight shift to right. Pt required CGA and performed till fatigue (less than 10 reps)  Manual Therapy: Palpation of L distal semitendinosus/semimembranosus performed with trigger points/tension noted. Education and rationale provided with pt agreeing to participate in intervention. - Light trigger point release ito stated with soft tissue mobilization to follow throughout. Increased time required to adjust to pt's tolerance. Pt presented with ability to achieve great knee extension ROM following with reports of decrease in tightness/pain.   Patient sitting in WC at end of session with brakes locked, safety belt alarm set, and all needs within reach.      Therapy Documentation Precautions:  Precautions Precautions: Fall Recall of Precautions/Restrictions: Impaired Restrictions Weight Bearing Restrictions Per Provider Order: Yes LLE Weight Bearing Per Provider Order: Weight bearing as tolerated Other Position/Activity Restrictions: Patient has a boil on her buttocks that limits ability to sit  Therapy/Group: Individual Therapy  Shayna Eblen PTA 11/28/2023, 12:24 PM

## 2023-11-29 DIAGNOSIS — S72145D Nondisplaced intertrochanteric fracture of left femur, subsequent encounter for closed fracture with routine healing: Secondary | ICD-10-CM | POA: Diagnosis not present

## 2023-11-29 DIAGNOSIS — Z794 Long term (current) use of insulin: Secondary | ICD-10-CM

## 2023-11-29 DIAGNOSIS — E1165 Type 2 diabetes mellitus with hyperglycemia: Secondary | ICD-10-CM | POA: Diagnosis not present

## 2023-11-29 DIAGNOSIS — I63411 Cerebral infarction due to embolism of right middle cerebral artery: Secondary | ICD-10-CM | POA: Diagnosis not present

## 2023-11-29 DIAGNOSIS — N390 Urinary tract infection, site not specified: Secondary | ICD-10-CM | POA: Diagnosis not present

## 2023-11-29 DIAGNOSIS — A499 Bacterial infection, unspecified: Secondary | ICD-10-CM

## 2023-11-29 LAB — GLUCOSE, CAPILLARY
Glucose-Capillary: 126 mg/dL — ABNORMAL HIGH (ref 70–99)
Glucose-Capillary: 298 mg/dL — ABNORMAL HIGH (ref 70–99)
Glucose-Capillary: 302 mg/dL — ABNORMAL HIGH (ref 70–99)
Glucose-Capillary: 181 mg/dL — ABNORMAL HIGH (ref 70–99)
Glucose-Capillary: 142 mg/dL — ABNORMAL HIGH (ref 70–99)

## 2023-11-29 MED ORDER — CEPHALEXIN 250 MG PO CAPS
500.0000 mg | ORAL_CAPSULE | Freq: Two times a day (BID) | ORAL | Status: DC
  Administered 2023-11-29 – 2023-12-01 (×5): 500 mg via ORAL
  Filled 2023-11-29 (×5): qty 2

## 2023-11-29 MED ORDER — PHENAZOPYRIDINE HCL 100 MG PO TABS
100.0000 mg | ORAL_TABLET | Freq: Three times a day (TID) | ORAL | Status: AC
  Administered 2023-11-29 – 2023-12-02 (×9): 100 mg via ORAL
  Filled 2023-11-29 (×9): qty 1

## 2023-11-29 MED ORDER — FLUCONAZOLE 150 MG PO TABS
150.0000 mg | ORAL_TABLET | Freq: Once | ORAL | Status: AC
  Administered 2023-11-29: 150 mg via ORAL
  Filled 2023-11-29: qty 1

## 2023-11-29 NOTE — Progress Notes (Signed)
 PROGRESS NOTE   Subjective/Complaints:  Pt with frustration that she's still having burning when she urinates and in general in her perineal area. She denies odor, fever, or discharge.   ROS: Patient denies fever, rash, sore throat, blurred vision, dizziness, nausea, vomiting, diarrhea, cough, shortness of breath or chest pain, joint or back/neck pain, headache, or mood change.    Objective:   DG Shoulder Left Result Date: 11/27/2023 CLINICAL DATA:  Left shoulder pain.  History of rotator cuff injury. EXAM: LEFT SHOULDER - 2+ VIEW COMPARISON:  None Available. FINDINGS: There is no evidence of fracture or dislocation. Mild degenerative changes are seen involving the left acromioclavicular and glenohumeral joints. Soft tissues are unremarkable. IMPRESSION: Mild degenerative joint disease. No acute abnormality seen. Electronically Signed   By: Lynwood Landy Raddle M.D.   On: 11/27/2023 12:57   No results for input(s): WBC, HGB, HCT, PLT in the last 72 hours.  No results for input(s): NA, K, CL, CO2, GLUCOSE, BUN, CREATININE, CALCIUM  in the last 72 hours.   Intake/Output Summary (Last 24 hours) at 11/29/2023 0940 Last data filed at 11/29/2023 0741 Gross per 24 hour  Intake 337 ml  Output --  Net 337 ml        Physical Exam: Vital Signs Blood pressure (!) 111/58, pulse 74, temperature 98.3 F (36.8 C), temperature source Oral, resp. rate 18, height 5' 4 (1.626 m), weight 73.9 kg, SpO2 98%.   Constitutional: No distress . Vital signs reviewed. HEENT: NCAT, EOMI, oral membranes moist Neck: supple Cardiovascular: RRR without murmur. No JVD    Respiratory/Chest: CTA Bilaterally without wheezes or rales. Normal effort    GI/Abdomen: BS +, non-tender, non-distended Ext: no clubbing, cyanosis, or edema Psych: pleasant and cooperative  Skin: No evidence of breakdown, no evidence of rash Neurologic: Cranial nerves  II through XII intact, motor strength is 5/5 in right deltoid, bicep, tricep, grip, hip flexor, knee extensors, ankle dorsiflexor and plantar flexor Left delt and biceps limited by pain but at least 3-/5  normal grip, WF, WE,  LLE limited by pain for HF, KE, 4/5 ankle PF and DF Sensory exam normal sensation to light touch and proprioception in bilateral upper and lower extremities  Musculoskeletal: Reduced ROM Bilateral shoulder abd and flexion , left shoulder 75% range , right shoulder ~50% range, pain at end range  No joint swelling  Left HF, KE limited by pain --no changes   Assessment/Plan: 1. Functional deficits which require 3+ hours per day of interdisciplinary therapy in a comprehensive inpatient rehab setting. Physiatrist is providing close team supervision and 24 hour management of active medical problems listed below. Physiatrist and rehab team continue to assess barriers to discharge/monitor patient progress toward functional and medical goals  Care Tool:  Bathing        Body parts bathed by helper: Right arm, Left arm, Chest, Abdomen, Front perineal area, Buttocks, Right upper leg, Left upper leg, Right lower leg, Left lower leg, Face     Bathing assist Assist Level: Total Assistance - Patient < 25% (nursing bath prior to therapy session)     Upper Body Dressing/Undressing Upper body dressing   What is the patient  wearing?: Pull over shirt    Upper body assist Assist Level: Set up assist    Lower Body Dressing/Undressing Lower body dressing      What is the patient wearing?: Pants     Lower body assist Assist for lower body dressing: Moderate Assistance - Patient 50 - 74%     Toileting Toileting    Toileting assist Assist for toileting: Dependent - Patient 0%     Transfers Chair/bed transfer  Transfers assist     Chair/bed transfer assist level: Moderate Assistance - Patient 50 - 74%     Locomotion Ambulation   Ambulation assist       Assist level: Maximal Assistance - Patient 25 - 49% Assistive device: Walker-rolling Max distance: 3   Walk 10 feet activity   Assist  Walk 10 feet activity did not occur: Safety/medical concerns        Walk 50 feet activity   Assist Walk 50 feet with 2 turns activity did not occur: Safety/medical concerns         Walk 150 feet activity   Assist Walk 150 feet activity did not occur: Safety/medical concerns         Walk 10 feet on uneven surface  activity   Assist Walk 10 feet on uneven surfaces activity did not occur: Safety/medical concerns         Wheelchair     Assist Is the patient using a wheelchair?: Yes Type of Wheelchair: Manual    Wheelchair assist level: Supervision/Verbal cueing Max wheelchair distance: 25    Wheelchair 50 feet with 2 turns activity    Assist        Assist Level: Moderate Assistance - Patient 50 - 74%   Wheelchair 150 feet activity     Assist      Assist Level: Dependent - Patient 0%   Blood pressure (!) 111/58, pulse 74, temperature 98.3 F (36.8 C), temperature source Oral, resp. rate 18, height 5' 4 (1.626 m), weight 73.9 kg, SpO2 98%.  Medical Problem List and Plan: 1. 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  Possible punctate infarct left middle cerebellar peduncle versus artifact.  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             -patient may  shower-cover L hip dressing with occlusive dressing             -ELOS/Goals: 13-15 days- mod I to supervision             -Continue CIR therapies including PT, OT  2.  Antithrombotics: -DVT/anticoagulation:  Pharmaceutical: Lovenox .  Check vascular study             -antiplatelet therapy: Aspirin  81 mg daily and Plavix  75 mg daily 3. Pain Management: Neurontin  800 mg 3 times daily, Voltaren  gel  4 g 4 times daily to affected area, oxycodone  5 mg every 4 hours as needed, tramadol  50 mg every 6 hours as needed, Robaxin  500 mg every 8 hours as needed- of note, doesn't like to take meds- encouraged her to do so prior to therapy 4. Mood/Behavior/Sleep: Wellbutrin  150 mg twice daily, trazodone  25 mg nightly as needed             -antipsychotic agents: N/A 5. Neuropsych/cognition: This patient is capable of making decisions on her own behalf. 6. Skin/Wound Care: Routine skin checks 7.  Fluids/Electrolytes/Nutrition: Routine in and outs with follow-up chemistries 8.  Left nondisplaced intertrochanteric hip fracture of the proximal left femur.  Status post IM nailing 11/19/2023.  Weightbearing as tolerated 9.  Acute blood loss anemia.  Follow-up CBC 10.  Hypertension.  Norvasc  10 mg daily, Coreg  25 mg twice daily, lisinopril  40 mg daily.     9/6 good control Vitals:   11/28/23 2053 11/29/23 0526  BP: 126/64 (!) 111/58  Pulse: 82 74  Resp: 18 18  Temp: 98.5 F (36.9 C) 98.3 F (36.8 C)  SpO2: 96% 98%    11.  Diabetes mellitus.  Hemoglobin A1c 12.4.  Currently on Lantus  insulin  15 units every 24 hours.  Check blood sugars AC and at bedtime.  Prior to admission patient on Glucophage  500 mg daily, Trulicity weekly CBG (last 3)  Recent Labs    11/28/23 1722 11/28/23 2052 11/29/23 0529  GLUCAP 308* 327* 126*  Increase Lantus  to 20U 9/5 Metformin  started 9/5 9/6 above just started yesterday--cbg fair this morning--obsv for response  12.  AKI on CKD stage IIIB.  Follow-up chemistries    Latest Ref Rng & Units 11/26/2023    4:52 AM 11/22/2023    6:22 AM 11/21/2023    3:25 AM  BMP  Glucose 70 - 99 mg/dL 866  878  842   BUN 8 - 23 mg/dL 15  8  10    Creatinine 0.44 - 1.00 mg/dL 8.81  9.07  9.10   Sodium 135 - 145 mmol/L 135  136  136   Potassium 3.5 - 5.1 mmol/L 4.3  3.4  3.3   Chloride 98 - 111 mmol/L 99  102  101   CO2 22 - 32 mmol/L 26  27  25    Calcium  8.9 - 10.3 mg/dL 9.0  8.4  8.6      13.  Tobacco abuse.  NicoDerm patch.  Provide counseling 14.  Hyperlipidemia.  Lipitor 15.  Constipation.  Adjust bowel program as needed- gave Sorbitol  30cc since lBM 4-5 days ago- if doesn't have good results, suggest Soap Suds enema.  16. Urinary retention with overflow- ordered bladder scans and to cath q6 hours prn if bladder scan >350cc- also ordered U/A and Cx, and Flomax  0.4 mg q supper -at this time U/A is in process- I ordered in/out caths if volumes >350cc  -9/6 continued sx, incontinen too    -repeat UCx. Begin empiric keflex      -pyridium  for sx     -will give a dose of diflucan  x 1 given #19  17. Mild spasticity- monitor for symptoms 18. Of note, pt's mother is 86 years old and still doing functionally well! 19. Thrush- ordered nystatin  5ml q6 hours for mild thrush- might need Diflucan - monitor    20.  Left shoulder pain with hx of RCR s/p recent fall -  Xray with mild OA, pain improving with time/PT,OT--better 9/6?    LOS: 4 days A FACE TO FACE EVALUATION WAS PERFORMED  Arthea ONEIDA Gunther 11/29/2023, 9:40 AM

## 2023-11-29 NOTE — Progress Notes (Signed)
 Speech Language Pathology Daily Session Note  Patient Details  Name: [name] MRN: [id number] Date of Birth: 03-02-1951  Today's Date: 11/29/2023 SLP Individual Time: 1300-1353 SLP Individual Time Calculation (min): 53 min  Short Term Goals: Week 1: SLP Short Term Goal 1 (Week 1): Patient will demonstrate sustained attention during functional tasks for >5 minutes given mod multimodal A SLP Short Term Goal 2 (Week 1): Patient will recall new, daily information given mod multimodal A SLP Short Term Goal 3 (Week 1): Patient will demonstrate simple problem solving skills during functional tasks given min multimodal A SLP Short Term Goal 4 (Week 1): Patient will demonstrate emergent awareness of mistakes given min verbal A  Skilled Therapeutic Interventions: SLP conducted skilled therapy session targeting cognitive goals. SLP targeted memory book completion during beginning half of session. Patient required mod assist to recall information from prior therapy sessions as well as individual therapy sessions that took place. During session, patient noticeably tangential, required min to mod cues to reorient to therapy tasks throughout. During conversation, patient benefited from supervision assist intermittently for complex word finding and recalled specific details from her past/organized stories with modI. Patient was left in room with call bell in reach and alarm set. SLP will continue to target goals per plan of care.        Pain Pain Assessment Pain Scale: 0-10 Pain Score: 6  Pain Location: Leg Pain Intervention(s): Medication (See eMAR)  Therapy/Group: Individual Therapy  Rosina Downy, M.A., CCC-SLP  Yamil Dougher A Viliami Bracco 11/29/2023, 1:53 PM

## 2023-11-29 NOTE — Progress Notes (Signed)
 Physical Therapy Session Note  Patient Details  Name: [name] MRN: [id number] Date of Birth: 1950-07-04  Today's Date: 11/29/2023 PT Individual Time: 1107-1157 PT Individual Time Calculation (min): 50 min   Short Term Goals: Week 1:  PT Short Term Goal 1 (Week 1): Pt will transfer sup to sit w/ min A PT Short Term Goal 2 (Week 1): Pt will transfer sit to stand w/ min A consistently. PT Short Term Goal 3 (Week 1): Pt will amb x 25' w/ RW and min A  Skilled Therapeutic Interventions/Progress Updates: Patient sitting in [facility] with nsg present on entrance to room. Patient alert and agreeable to PT session.   Patient reported no pain at beginning of session (with no movement) and reported increased in pain in L thigh during exercises. Pt presented with decreased recall of entire reason of hospital stay (pt forgot she had L hip surgery). Pt also presented with difficulty in recalling earlier session in morning and previous day. Pt transported to day room gym in [facility] dependently. Pt performed sit<>stands from WC<>RW with heavy minA and VC to extend L LE past R LE to decrease WB on L LE to avoid increase in pain (same cue when sitting with pt reporting decrease in feelings of falling forward). Pt stood in RW and cued to take few steps with pt reporting increase in pain (took 4 small steps) and required seated rest break. Pt performed AAROM with LAQ with L LE (R LE assisting) to achieve full available extension. 2 rounds till fatigue with rest break with 3rd round of pt performing AROM with reports of pain in back of knee with movement. Noted swelling back of knee with nsg notified and tender to touch. Covering physician and attending nsg notified to provide appropriate interventions to assist. Pt continued with seated therex with hip flexion L LE with cues to control eccentric. Pt continued to report pain and was transported back to room in [facility] dependently.   Patient sitting in WC at end of session with  brakes locked, belt alarm set, and all needs within reach.      Therapy Documentation Precautions:  Precautions Precautions: Fall Recall of Precautions/Restrictions: Impaired Restrictions Weight Bearing Restrictions Per Provider Order: Yes LLE Weight Bearing Per Provider Order: Weight bearing as tolerated Other Position/Activity Restrictions: Patient has a boil on her buttocks that limits ability to sit   Therapy/Group: Individual Therapy  Laurell Coalson PTA 11/29/2023, 12:05 PM

## 2023-11-29 NOTE — Progress Notes (Signed)
 Occupational Therapy Session Note  Patient Details  Name: [name] MRN: [id number] Date of Birth: 1950-04-13  Today's Date: 11/29/2023 OT Individual Time: 9269-9154 OT Individual Time Calculation (min): 75 min    Short Term Goals: Week 1:  OT Short Term Goal 1 (Week 1): Patient will complete toilet transfer with mod assist OT Short Term Goal 2 (Week 1): Patient will don pants with mod assist OT Short Term Goal 3 (Week 1): Patient will complete toileting with mod assist  Skilled Therapeutic Interventions/Progress Updates:  Patient agreeable to participate in OT session. Reports no pain level at current, with movement pain increases to 6/10. Patient received in bed ready for therapy. Patient completed bed mobility with CGA to mi A with HOB elevated with a leg lok on affected LE. Patient completed transfer with min A with RW. Patient set up for bathing at sink. Completed UB bathing SU, LB bathing completed with .Patient completed dressing with SU for UB, LB completed with mod A with AE. Patient educated on AE use and proper positioning for most effective use. Patient completed sit to stand x2 with min A with some complaints of pain. Pain medication administered by RN. Patient required increased time for self care with max verbal cues for attention. Left in chair alarm on, all verbalized needs in reach.   Therapy Documentation Precautions:  Precautions Precautions: Fall Recall of Precautions/Restrictions: Impaired Restrictions Weight Bearing Restrictions Per Provider Order: Yes LLE Weight Bearing Per Provider Order: Weight bearing as tolerated Other Position/Activity Restrictions: Patient has a boil on her buttocks that limits ability to sit   Therapy/Group: Individual Therapy  D'mariea L Brighton Pilley 11/29/2023, 7:19 AM

## 2023-11-30 DIAGNOSIS — I63411 Cerebral infarction due to embolism of right middle cerebral artery: Secondary | ICD-10-CM | POA: Diagnosis not present

## 2023-11-30 DIAGNOSIS — N390 Urinary tract infection, site not specified: Secondary | ICD-10-CM | POA: Diagnosis not present

## 2023-11-30 DIAGNOSIS — S72145D Nondisplaced intertrochanteric fracture of left femur, subsequent encounter for closed fracture with routine healing: Secondary | ICD-10-CM | POA: Diagnosis not present

## 2023-11-30 DIAGNOSIS — E1165 Type 2 diabetes mellitus with hyperglycemia: Secondary | ICD-10-CM | POA: Diagnosis not present

## 2023-11-30 LAB — GLUCOSE, CAPILLARY
Glucose-Capillary: 167 mg/dL — ABNORMAL HIGH (ref 70–99)
Glucose-Capillary: 130 mg/dL — ABNORMAL HIGH (ref 70–99)
Glucose-Capillary: 214 mg/dL — ABNORMAL HIGH (ref 70–99)
Glucose-Capillary: 288 mg/dL — ABNORMAL HIGH (ref 70–99)

## 2023-11-30 NOTE — Progress Notes (Signed)
 PROGRESS NOTE   Subjective/Complaints:  Pt says burning is better with urination. Reports that she had a nightmare last night and not sure what brought it on. Feels ok this morning  ROS: Patient denies fever, rash, sore throat, blurred vision, dizziness, nausea, vomiting, diarrhea, cough, shortness of breath or chest pain, joint or back/neck pain, headache, or mood change.     Objective:   No results found.  No results for input(s): WBC, HGB, HCT, PLT in the last 72 hours.  No results for input(s): NA, K, CL, CO2, GLUCOSE, BUN, CREATININE, CALCIUM  in the last 72 hours.   Intake/Output Summary (Last 24 hours) at 11/30/2023 0848 Last data filed at 11/30/2023 0741 Gross per 24 hour  Intake 693 ml  Output --  Net 693 ml        Physical Exam: Vital Signs Blood pressure 108/64, pulse 77, temperature 98.9 F (37.2 C), temperature source Oral, resp. rate 18, height 5' 4 (1.626 m), weight 73.9 kg, SpO2 95%.   Constitutional: No distress . Vital signs reviewed. HEENT: NCAT, EOMI, oral membranes moist Neck: supple Cardiovascular: RRR without murmur. No JVD    Respiratory/Chest: CTA Bilaterally without wheezes or rales. Normal effort    GI/Abdomen: BS +, non-tender, non-distended Ext: no clubbing, cyanosis, or edema Psych: pleasant and cooperative  Skin: No evidence of breakdown, no evidence of rash Neurologic: Cranial nerves II through XII intact, motor strength is 5/5 in right deltoid, bicep, tricep, grip, hip flexor, knee extensors, ankle dorsiflexor and plantar flexor Left delt and biceps limited by pain but at least 3-/5  normal grip, WF, WE,  LLE limited by pain for HF, KE, 4/5 ankle PF and DF Sensory exam normal sensation to light touch and proprioception in bilateral upper and lower extremities. Prior neuro assessment is c/w 11/30/2023 exam.   Musculoskeletal: Reduced ROM Bilateral shoulder abd  and flexion , left shoulder 75% range , right shoulder ~50% range, pain at end range  No joint swelling  Left HF, KE limited by pain --stable exam   Assessment/Plan: 1. Functional deficits which require 3+ hours per day of interdisciplinary therapy in a comprehensive inpatient rehab setting. Physiatrist is providing close team supervision and 24 hour management of active medical problems listed below. Physiatrist and rehab team continue to assess barriers to discharge/monitor patient progress toward functional and medical goals  Care Tool:  Bathing        Body parts bathed by helper: Right arm, Left arm, Chest, Abdomen, Front perineal area, Buttocks, Right upper leg, Left upper leg, Right lower leg, Left lower leg, Face     Bathing assist Assist Level: Total Assistance - Patient < 25% (nursing bath prior to therapy session)     Upper Body Dressing/Undressing Upper body dressing   What is the patient wearing?: Pull over shirt    Upper body assist Assist Level: Set up assist    Lower Body Dressing/Undressing Lower body dressing      What is the patient wearing?: Pants     Lower body assist Assist for lower body dressing: Moderate Assistance - Patient 50 - 74%     Toileting Toileting    Toileting assist Assist  for toileting: Dependent - Patient 0%     Transfers Chair/bed transfer  Transfers assist     Chair/bed transfer assist level: Moderate Assistance - Patient 50 - 74%     Locomotion Ambulation   Ambulation assist      Assist level: Maximal Assistance - Patient 25 - 49% Assistive device: Walker-rolling Max distance: 3   Walk 10 feet activity   Assist  Walk 10 feet activity did not occur: Safety/medical concerns        Walk 50 feet activity   Assist Walk 50 feet with 2 turns activity did not occur: Safety/medical concerns         Walk 150 feet activity   Assist Walk 150 feet activity did not occur: Safety/medical concerns          Walk 10 feet on uneven surface  activity   Assist Walk 10 feet on uneven surfaces activity did not occur: Safety/medical concerns         Wheelchair     Assist Is the patient using a wheelchair?: Yes Type of Wheelchair: Manual    Wheelchair assist level: Supervision/Verbal cueing Max wheelchair distance: 25    Wheelchair 50 feet with 2 turns activity    Assist        Assist Level: Moderate Assistance - Patient 50 - 74%   Wheelchair 150 feet activity     Assist      Assist Level: Dependent - Patient 0%   Blood pressure 108/64, pulse 77, temperature 98.9 F (37.2 C), temperature source Oral, resp. rate 18, height 5' 4 (1.626 m), weight 73.9 kg, SpO2 95%.  Medical Problem List and Plan: 1. 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  Possible punctate infarct left middle cerebellar peduncle versus artifact.  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             -patient may  shower-cover L hip dressing with occlusive dressing             -ELOS/Goals: 13-15 days- mod I to supervision             -Continue CIR therapies including PT, OT  2.  Antithrombotics: -DVT/anticoagulation:  Pharmaceutical: Lovenox .  Check vascular study             -antiplatelet therapy: Aspirin  81 mg daily and Plavix  75 mg daily 3. Pain Management: Neurontin  800 mg 3 times daily, Voltaren  gel 4 g 4 times daily to affected area, oxycodone  5 mg every 4 hours as needed, tramadol  50 mg every 6 hours as needed, Robaxin  500 mg every 8 hours as needed- of note, doesn't like to take meds- encouraged her to do so prior to therapy 4. Mood/Behavior/Sleep: Wellbutrin  150 mg twice daily, trazodone  25 mg nightly as needed             -antipsychotic agents: N/A 5. Neuropsych/cognition: This patient is capable of making decisions on her own  behalf. 6. Skin/Wound Care: Routine skin checks 7. Fluids/Electrolytes/Nutrition: Routine in and outs with follow-up chemistries 8.  Left nondisplaced intertrochanteric hip fracture of the proximal left femur.  Status post IM nailing 11/19/2023.  Weightbearing as tolerated 9.  Acute blood loss anemia.  Follow-up CBC in am 9/8 10.  Hypertension.  Norvasc  10 mg daily, Coreg  25 mg twice daily, lisinopril  40 mg daily.     9/7 good control Vitals:  11/30/23 0440 11/30/23 0806  BP: (!) 110/56 108/64  Pulse: 77   Resp: 18   Temp: 98.9 F (37.2 C)   SpO2: 95%     11.  Diabetes mellitus.  Hemoglobin A1c 12.4.  Currently on Lantus  insulin  15 units every 24 hours.  Check blood sugars AC and at bedtime.  Prior to admission patient on Glucophage  500 mg daily, Trulicity weekly CBG (last 3)  Recent Labs    11/29/23 2010 11/29/23 2223 11/30/23 0445  GLUCAP 181* 142* 167*  Increase Lantus  to 20U 9/5 Metformin  started 9/5 9/7 fair control, increase metformin  to bid  12.  AKI on CKD stage IIIB.  Follow-up chemistries    Latest Ref Rng & Units 11/26/2023    4:52 AM 11/22/2023    6:22 AM 11/21/2023    3:25 AM  BMP  Glucose 70 - 99 mg/dL 866  878  842   BUN 8 - 23 mg/dL 15  8  10    Creatinine 0.44 - 1.00 mg/dL 8.81  9.07  9.10   Sodium 135 - 145 mmol/L 135  136  136   Potassium 3.5 - 5.1 mmol/L 4.3  3.4  3.3   Chloride 98 - 111 mmol/L 99  102  101   CO2 22 - 32 mmol/L 26  27  25    Calcium  8.9 - 10.3 mg/dL 9.0  8.4  8.6     13.  Tobacco abuse.  NicoDerm patch.  Provide counseling 14.  Hyperlipidemia.  Lipitor 15.  Constipation.  Adjust bowel program as needed- gave Sorbitol  30cc since lBM 4-5 days ago- if doesn't have good results, suggest Soap Suds enema.  16. Urinary retention with overflow- ordered bladder scans and to cath q6 hours prn if bladder scan >350cc- also ordered U/A and Cx, and Flomax  0.4 mg q supper -at this time U/A is in process- I ordered in/out caths if volumes >350cc  -9/6  continued sx, incontinen too    -repeat UCx. Begin empiric keflex      -pyridium  for sx     -will give a dose of diflucan  x 1 given #19 9/7 dysuria better -continue keflex  and pyridium  -repeat UCX pending 17. Mild spasticity- monitor for symptoms 18. Of note, pt's mother is 79 years old and still doing functionally well! 19. Thrush- ordered nystatin  5ml q6 hours for mild thrush- might need Diflucan - monitor    20.  Left shoulder pain with hx of RCR s/p recent fall -  Xray with mild OA, pain improving with time/PT,OT--   -9/7 pt reports improvement    LOS: 5 days A FACE TO FACE EVALUATION WAS PERFORMED  [name] 11/30/2023, 8:48 AM

## 2023-12-01 LAB — BASIC METABOLIC PANEL WITH GFR
Anion gap: 9 (ref 5–15)
BUN: 30 mg/dL — ABNORMAL HIGH (ref 8–23)
CO2: 27 mmol/L (ref 22–32)
Calcium: 9.6 mg/dL (ref 8.9–10.3)
Chloride: 100 mmol/L (ref 98–111)
Creatinine, Ser: 1.38 mg/dL — ABNORMAL HIGH (ref 0.44–1.00)
GFR, Estimated: 41 mL/min — ABNORMAL LOW (ref >60)
Glucose, Bld: 128 mg/dL — ABNORMAL HIGH (ref 70–99)
Potassium: 4.3 mmol/L (ref 3.5–5.1)
Sodium: 136 mmol/L (ref 135–145)

## 2023-12-01 LAB — URINE CULTURE: Culture: >=100000 — AB

## 2023-12-01 LAB — GLUCOSE, CAPILLARY
Glucose-Capillary: 139 mg/dL — ABNORMAL HIGH (ref 70–99)
Glucose-Capillary: 209 mg/dL — ABNORMAL HIGH (ref 70–99)
Glucose-Capillary: 285 mg/dL — ABNORMAL HIGH (ref 70–99)
Glucose-Capillary: 342 mg/dL — ABNORMAL HIGH (ref 70–99)

## 2023-12-01 MED ORDER — METFORMIN HCL 850 MG PO TABS
850.0000 mg | ORAL_TABLET | Freq: Every day | ORAL | Status: DC
  Administered 2023-12-02 – 2023-12-03 (×2): 850 mg via ORAL
  Filled 2023-12-01 (×2): qty 1

## 2023-12-01 MED ORDER — NITROFURANTOIN MONOHYD MACRO 100 MG PO CAPS
100.0000 mg | ORAL_CAPSULE | Freq: Two times a day (BID) | ORAL | Status: AC
  Administered 2023-12-01 – 2023-12-07 (×14): 100 mg via ORAL
  Filled 2023-12-01 (×15): qty 1

## 2023-12-01 MED ORDER — BISACODYL 10 MG RE SUPP
10.0000 mg | Freq: Once | RECTAL | Status: AC
  Administered 2023-12-01: 10 mg via RECTAL
  Filled 2023-12-01: qty 1

## 2023-12-01 NOTE — Progress Notes (Signed)
 Physical Therapy Session Note  Patient Details  Name: [name] MRN: [id number] Date of Birth: 1950-09-27  Today's Date: 12/01/2023 PT Individual Time: 0800-0845 PT Individual Time Calculation (min): 45 min   Short Term Goals: Week 1:  PT Short Term Goal 1 (Week 1): Pt will transfer sup to sit w/ min A PT Short Term Goal 2 (Week 1): Pt will transfer sit to stand w/ min A consistently. PT Short Term Goal 3 (Week 1): Pt will amb x 25' w/ RW and min A  Skilled Therapeutic Interventions/Progress Updates: Pt presents semi-reclined in bed and agreeable to therapy.  Pain increased as mobility increased, rest breaks given.  Pt able to don socks in long sitting and pull up pants after total A to thread pants over LLE, but able to perform on RLE.  Pt bridged w/ B LES, but required rolling side to side to complete w/ PT A.  Pt transferred sup to sit w/ mod/min A and cues.  Pt donned pull-over shirt w/ set-up and Total A for shoes.  Pt transferred sit to stand w/ min A and cues for breathing.  Pt able to take steps to pivot bed > w/c, mod A and cues for posture and foot clearance RLE.  Pt performed LE there ex for LAQ, initially AAROM L but improving to AROM but not complete ROM.  Pt remaines sitting in w/c w/ chair alarm on and all needs in reach.     Therapy Documentation Precautions:  Precautions Precautions: Fall Recall of Precautions/Restrictions: Impaired Restrictions Weight Bearing Restrictions Per Provider Order: Yes LLE Weight Bearing Per Provider Order: Weight bearing as tolerated Other Position/Activity Restrictions: Patient has a boil on her buttocks that limits ability to sit General:   Vital Signs: Therapy Vitals Temp: 98.8 F (37.1 C) Pulse Rate: 75 Resp: 17 BP: 118/67 Patient Position (if appropriate): Lying Oxygen Therapy SpO2: 94 % O2 Device: Room Air Pain:0/10 initially, increased to 7/10 w/ WB. Pain Assessment Pain Scale: 0-10 Pain Score: 0-No  pain    Therapy/Group: Individual Therapy  [name] P Heike Pounds 12/01/2023, 8:48 AM

## 2023-12-01 NOTE — Progress Notes (Signed)
 Speech Language Pathology Daily Session Note  Patient Details  Name: [name] MRN: [id number] Date of Birth: 13-Mar-1951  Today's Date: 12/01/2023 SLP Individual Time: 9095-9042 SLP Individual Time Calculation (min): 53 min 7 missed minutes due to toileting at beginning and end of session Session 2: 1300-1344 - 44 minutes   Short Term Goals: Week 1: SLP Short Term Goal 1 (Week 1): Patient will demonstrate sustained attention during functional tasks for >5 minutes given mod multimodal A SLP Short Term Goal 2 (Week 1): Patient will recall new, daily information given mod multimodal A SLP Short Term Goal 3 (Week 1): Patient will demonstrate simple problem solving skills during functional tasks given min multimodal A SLP Short Term Goal 4 (Week 1): Patient will demonstrate emergent awareness of mistakes given min verbal A  Skilled Therapeutic Interventions: Session 1: Skilled therapy session focused on cognitive goals. Initial few minutes of session missed due to toileting. SLP targeted memory goals through review of memory book from over the weekend and completion of memory book for activities thus far today. SLP re-educated patient on memory strategies and encouraged use through paragraph retention task. Patient answered questions re: paragraph with 85% accuracy given supervisionA after a 10 minute delay. Patient left in [facility] with alarm set and call bell in reach. Continue POC. Session 2:  Skilled therapy session focused on cognitive goals. SLP facilitated session by prompting patient to complete medication management activity. Patient utilized written and verbalized directions to place 'pills' in BID pill box. Patient with x2 mistakes, requiring minA to corrent and consistent cues on differenciating AM/PM. SLP continued to challenge patient by prompting patient to identify medication errors. Patient required modA to locate and correct simple errors. Patient left in [facility] with alarm set and call bell  in reach. Continue POC   Pain Session 1: pain in knee- constant  Session 2:  none reported to slp  Therapy/Group: Individual Therapy  Mariany Mackintosh M.A., CCC-SLP 12/01/2023, 9:03 AM

## 2023-12-01 NOTE — Progress Notes (Signed)
 PROGRESS NOTE   Subjective/Complaints:  C/o constipation but no abd pain , would like a laxative after therapy   ROS: Patient denies fever, rash, sore throat, blurred vision, dizziness, nausea, vomiting, diarrhea, cough, shortness of breath or chest pain, joint or back/neck pain, headache, or mood change.     Objective:   No results found.  No results for input(s): WBC, HGB, HCT, PLT in the last 72 hours.  Recent Labs    12/01/23 0527  NA 136  K 4.3  CL 100  CO2 27  GLUCOSE 128*  BUN 30*  CREATININE 1.38*  CALCIUM  9.6     Intake/Output Summary (Last 24 hours) at 12/01/2023 1017 Last data filed at 12/01/2023 0800 Gross per 24 hour  Intake 720 ml  Output --  Net 720 ml        Physical Exam: Vital Signs Blood pressure 118/67, pulse 75, temperature 98.8 F (37.1 C), resp. rate 17, height 5' 4 (1.626 m), weight 73.9 kg, SpO2 94%.    General: No acute distress Mood and affect are appropriate Heart: Regular rate and rhythm no rubs murmurs or extra sounds Lungs: Clear to auscultation, breathing unlabored, no rales or wheezes Abdomen: Positive bowel sounds, soft nontender to palpation, nondistended Extremities: No clubbing, cyanosis, or edema   Skin: No evidence of breakdown, no evidence of rash Neurologic: Cranial nerves II through XII intact, motor strength is 5/5 in right deltoid, bicep, tricep, grip, hip flexor, knee extensors, ankle dorsiflexor and plantar flexor Left delt and biceps limited by pain but at least 3-/5  normal grip, WF, WE,  LLE limited by pain for HF, KE, 4/5 ankle PF and DF Sensory exam normal sensation to light touch and proprioception in bilateral upper and lower extremities. Prior neuro assessment is c/w 12/01/2023 exam.   Musculoskeletal: Reduced ROM Bilateral shoulder abd and flexion , left shoulder 75% range , right shoulder ~50% range, pain at end range  No joint swelling   Left HF, KE limited by pain --stable exam   Assessment/Plan: 1. Functional deficits which require 3+ hours per day of interdisciplinary therapy in a comprehensive inpatient rehab setting. Physiatrist is providing close team supervision and 24 hour management of active medical problems listed below. Physiatrist and rehab team continue to assess barriers to discharge/monitor patient progress toward functional and medical goals  Care Tool:  Bathing        Body parts bathed by helper: Right arm, Left arm, Chest, Abdomen, Front perineal area, Buttocks, Right upper leg, Left upper leg, Right lower leg, Left lower leg, Face     Bathing assist Assist Level: Total Assistance - Patient < 25% (nursing bath prior to therapy session)     Upper Body Dressing/Undressing Upper body dressing   What is the patient wearing?: Pull over shirt    Upper body assist Assist Level: Set up assist    Lower Body Dressing/Undressing Lower body dressing      What is the patient wearing?: Pants     Lower body assist Assist for lower body dressing: Moderate Assistance - Patient 50 - 74%     Toileting Toileting    Toileting assist Assist for toileting: Dependent -  Patient 0%     Transfers Chair/bed transfer  Transfers assist     Chair/bed transfer assist level: Moderate Assistance - Patient 50 - 74%     Locomotion Ambulation   Ambulation assist      Assist level: Maximal Assistance - Patient 25 - 49% Assistive device: Walker-rolling Max distance: 3   Walk 10 feet activity   Assist  Walk 10 feet activity did not occur: Safety/medical concerns        Walk 50 feet activity   Assist Walk 50 feet with 2 turns activity did not occur: Safety/medical concerns         Walk 150 feet activity   Assist Walk 150 feet activity did not occur: Safety/medical concerns         Walk 10 feet on uneven surface  activity   Assist Walk 10 feet on uneven surfaces activity did  not occur: Safety/medical concerns         Wheelchair     Assist Is the patient using a wheelchair?: Yes Type of Wheelchair: Manual    Wheelchair assist level: Supervision/Verbal cueing Max wheelchair distance: 25    Wheelchair 50 feet with 2 turns activity    Assist        Assist Level: Moderate Assistance - Patient 50 - 74%   Wheelchair 150 feet activity     Assist      Assist Level: Dependent - Patient 0%   Blood pressure 118/67, pulse 75, temperature 98.8 F (37.1 C), resp. rate 17, height 5' 4 (1.626 m), weight 73.9 kg, SpO2 94%.  Medical Problem List and Plan: 1. 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  Possible punctate infarct left middle cerebellar peduncle versus artifact.  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             -patient may  shower-cover L hip dressing with occlusive dressing             -ELOS/Goals: 13-15 days- mod I to supervision             -Continue CIR therapies including PT, OT  2.  Antithrombotics: -DVT/anticoagulation:  Pharmaceutical: Lovenox .  Check vascular study             -antiplatelet therapy: Aspirin  81 mg daily and Plavix  75 mg daily 3. Pain Management: Neurontin  800 mg 3 times daily, Voltaren  gel 4 g 4 times daily to affected area, oxycodone  5 mg every 4 hours as needed, tramadol  50 mg every 6 hours as needed, Robaxin  500 mg every 8 hours as needed- of note, doesn't like to take meds- encouraged her to do so prior to therapy 4. Mood/Behavior/Sleep: Wellbutrin  150 mg twice daily, trazodone  25 mg nightly as needed             -antipsychotic agents: N/A 5. Neuropsych/cognition: This patient is capable of making decisions on her own behalf. 6. Skin/Wound Care: Routine skin checks 7. Fluids/Electrolytes/Nutrition: Routine in and outs with follow-up chemistries 8.   Left nondisplaced intertrochanteric hip fracture of the proximal left femur.  Status post IM nailing 11/19/2023.  Weightbearing as tolerated 9.  Acute blood loss anemia.  Follow-up CBC in am 9/8 10.  Hypertension.  Norvasc  10 mg daily, Coreg  25 mg twice daily, lisinopril  40 mg daily.     9/7 good control Vitals:   11/30/23 1957 12/01/23 9443  BP: 117/65 118/67  Pulse: 71 75  Resp: 17 17  Temp: 98 F (36.7 C) 98.8 F (37.1 C)  SpO2: 99% 94%    11.  Diabetes mellitus.  Hemoglobin A1c 12.4.  Currently on Lantus  insulin  15 units every 24 hours.  Check blood sugars AC and at bedtime.  Prior to admission patient on Glucophage  500 mg daily, Trulicity weekly CBG (last 3)  Recent Labs    11/30/23 1705 11/30/23 2152 12/01/23 0555  GLUCAP 214* 288* 139*  Increase Lantus  to 20U 9/5- am CBG well controlled Metformin  started 9/5 Increase to 850mg  qam on 9/9 Using 10-20U Novalog SSI per 24 h 12.  AKI on CKD stage IIIB.  Follow-up chemistries    Latest Ref Rng & Units 12/01/2023    5:27 AM 11/26/2023    4:52 AM 11/22/2023    6:22 AM  BMP  Glucose 70 - 99 mg/dL 871  866  878   BUN 8 - 23 mg/dL 30  15  8    Creatinine 0.44 - 1.00 mg/dL 8.61  8.81  9.07   Sodium 135 - 145 mmol/L 136  135  136   Potassium 3.5 - 5.1 mmol/L 4.3  4.3  3.4   Chloride 98 - 111 mmol/L 100  99  102   CO2 22 - 32 mmol/L 27  26  27    Calcium  8.9 - 10.3 mg/dL 9.6  9.0  8.4     13.  Tobacco abuse.  NicoDerm patch.  Provide counseling 14.  Hyperlipidemia.  Lipitor 15.  Constipation.  Adjust bowel program as needed- gave Sorbitol  30cc since lBM 4-5 days ago- if doesn't have good results, suggest Soap Suds enema.  16. Urinary retention with overflow- ordered bladder scans and to cath q6 hours prn if bladder scan >350cc- also ordered U/A and Cx, and Flomax  0.4 mg q supper -at this time U/A is in process- I ordered in/out caths if volumes >350cc  + Citrobacter and Enterobacter No sensitivities to Keflex  Both sensitive to  nitrofurantoin  as well as Bactrim po.  Given some elevation of Creat will rx nitrofurantoin  x 7d  17. Mild spasticity- monitor for symptoms 18. Of note, pt's mother is 92 years old and still doing functionally well! 19. Thrush- ordered nystatin  5ml q6 hours for mild thrush- might need Diflucan - monitor    20.  Left shoulder pain with hx of RCR s/p recent fall -  Xray with mild OA, pain improving with time/PT,OT--   -9/7 pt reports improvement    LOS: 6 days A FACE TO FACE EVALUATION WAS PERFORMED  [name] 12/01/2023, 10:17 AM

## 2023-12-01 NOTE — Plan of Care (Signed)
   Problem: Education: Goal: Knowledge of General Education information will improve Description: Including pain rating scale, medication(s)/side effects and non-pharmacologic comfort measures Outcome: Progressing   Problem: Health Behavior/Discharge Planning: Goal: Ability to manage health-related needs will improve Outcome: Progressing   Problem: Nutrition: Goal: Adequate nutrition will be maintained Outcome: Progressing

## 2023-12-01 NOTE — Progress Notes (Signed)
 Occupational Therapy Session Note  Patient Details  Name: [name] MRN: [id number] Date of Birth: 10/23/1950  Today's Date: 12/01/2023 OT Individual Time: 1420-1508 OT Individual Time Calculation (min): 48 min    Short Term Goals: Week 1:  OT Short Term Goal 1 (Week 1): Patient will complete toilet transfer with mod assist OT Short Term Goal 2 (Week 1): Patient will don pants with mod assist OT Short Term Goal 3 (Week 1): Patient will complete toileting with mod assist  Skilled Therapeutic Interventions/Progress Updates:  Pt greeted seated on toilet with NT present, pt agreeable to OT intervention.    Session limited by pain in rectum from constipation, provided rest breaks as needed and adjusted session to pt tolerance.   Transfers/bed mobility/functional mobility: pt completed short distance functional ambulation in room ~ 7 ftx2 with RW and MINA, decreased stride length noted with antalgic gait.   Therapeutic activity:  Worked on sit>stands and standing tolerance using connect 4 game. Pt completed x5 stands total A with MIN A and MIN cues for hand placement. Pt able to stand for 20 secs during first trial but then progressed to 1 min and 30 secs.   ADLs:  Grooming: pt completed hand hygiene at sink with supervision.   Transfers: pt completed stand pivot transfer off of toilet with MINA, MIN cues needed for hand placement and technique as pt sitting prematurely in w/c.  Toileting: pt with continent BM, MAX A provided for clothing mgmt, pt reports cleansing self prior to OTA entering.   Cognition: pt unable to recall this therapist from previous sessions and unable to state any tasks completed during session when OTA wrote in memory book.                 Ended session with pt supine in bed with all needs within reach and bed alarm activated.                    Therapy Documentation Precautions:  Precautions Precautions: Fall Recall of Precautions/Restrictions:  Impaired Restrictions Weight Bearing Restrictions Per Provider Order: Yes LLE Weight Bearing Per Provider Order: Weight bearing as tolerated Other Position/Activity Restrictions: Patient has a boil on her buttocks that limits ability to sit    Therapy/Group: Individual Therapy  Ronal Gift [facility] 12/01/2023, 3:20 PM

## 2023-12-02 LAB — GLUCOSE, CAPILLARY
Glucose-Capillary: 221 mg/dL — ABNORMAL HIGH (ref 70–99)
Glucose-Capillary: 241 mg/dL — ABNORMAL HIGH (ref 70–99)
Glucose-Capillary: 227 mg/dL — ABNORMAL HIGH (ref 70–99)
Glucose-Capillary: 291 mg/dL — ABNORMAL HIGH (ref 70–99)

## 2023-12-02 MED ORDER — BETHANECHOL CHLORIDE 10 MG PO TABS
5.0000 mg | ORAL_TABLET | Freq: Three times a day (TID) | ORAL | Status: DC
Start: 2023-12-02 — End: 2023-12-03
  Administered 2023-12-02 – 2023-12-03 (×3): 5 mg via ORAL
  Filled 2023-12-02 (×3): qty 1

## 2023-12-02 MED ORDER — DOCUSATE SODIUM 100 MG PO CAPS
200.0000 mg | ORAL_CAPSULE | Freq: Two times a day (BID) | ORAL | Status: DC
  Administered 2023-12-02 – 2023-12-07 (×10): 200 mg via ORAL
  Filled 2023-12-02 (×8): qty 2

## 2023-12-02 NOTE — Progress Notes (Signed)
 Speech Language Pathology Daily Session Note  Patient Details  Name: [name] MRN: [id number] Date of Birth: 18-Sep-1950  Today's Date: 12/02/2023 SLP Individual Time: 1445-1530 SLP Individual Time Calculation (min): 45 min  Short Term Goals: Week 1: SLP Short Term Goal 1 (Week 1): Patient will demonstrate sustained attention during functional tasks for >5 minutes given mod multimodal A SLP Short Term Goal 2 (Week 1): Patient will recall new, daily information given mod multimodal A SLP Short Term Goal 3 (Week 1): Patient will demonstrate simple problem solving skills during functional tasks given min multimodal A SLP Short Term Goal 4 (Week 1): Patient will demonstrate emergent awareness of mistakes given min verbal A  Skilled Therapeutic Interventions: Skilled therapy session focused on cognitive goals. Upon entrance, patient completing memory book from previous PT session. Patient shared activities completed in PT, however with difficulty sharing activities from OT first thing this morning. Patient independently recalled medication management task from ST yesterday. To target problem solving goals, SLP prompted completion of meal budgeting task. Patient was tasked to budget for an appetizer, meal, dessert and drink for under $50. Once patient chose meal, she created a grocery list with minA. Patient left in chair with alarm set and call bell in reach. Continue POC.    Pain None reported   Therapy/Group: Individual Therapy  Jini Horiuchi M.A., CCC-SLP 12/02/2023, 7:52 AM

## 2023-12-02 NOTE — Progress Notes (Signed)
 PROGRESS NOTE   Subjective/Complaints:  No issues overnite, multiple BMs, still has left hip and knee pain with ambulation .  Knee pain chronic per pt   ROS: Patient denies fever, rash, sore throat, blurred vision, dizziness, nausea, vomiting, diarrhea, cough, shortness of breath or chest pain, joint or back/neck pain, headache, or mood change.     Objective:   No results found.  No results for input(s): WBC, HGB, HCT, PLT in the last 72 hours.  Recent Labs    12/01/23 0527  NA 136  K 4.3  CL 100  CO2 27  GLUCOSE 128*  BUN 30*  CREATININE 1.38*  CALCIUM  9.6     Intake/Output Summary (Last 24 hours) at 12/02/2023 0949 Last data filed at 12/02/2023 0846 Gross per 24 hour  Intake 240 ml  Output 1113 ml  Net -873 ml        Physical Exam: Vital Signs Blood pressure (!) 146/77, pulse 85, temperature 98.9 F (37.2 C), temperature source Oral, resp. rate 16, height 5' 4 (1.626 m), weight 73.9 kg, SpO2 96%.    General: No acute distress Mood and affect are appropriate Heart: Regular rate and rhythm no rubs murmurs or extra sounds Lungs: Clear to auscultation, breathing unlabored, no rales or wheezes Abdomen: Positive bowel sounds, soft nontender to palpation, nondistended Extremities: No clubbing, cyanosis, or edema   Skin: No evidence of breakdown, no evidence of rash Neurologic: Cranial nerves II through XII intact, motor strength is 5/5 in right deltoid, bicep, tricep, grip, hip flexor, knee extensors, ankle dorsiflexor and plantar flexor Left delt and biceps limited by pain but at least 3-/5  normal grip, WF, WE,  LLE limited by pain for HF, KE, 4/5 ankle PF and DF Sensory exam normal sensation to light touch and proprioception in bilateral upper and lower extremities. Prior neuro assessment is c/w 12/02/2023 exam.   Musculoskeletal: Reduced ROM Bilateral shoulder abd and flexion , left shoulder 75%  range , right shoulder ~50% range, pain at end range  No joint swelling  Left HF, KE limited by pain --stable exam Left knee contracture lack 20 deg from full ext, + bony enlargement of joint  Assessment/Plan: 1. Functional deficits which require 3+ hours per day of interdisciplinary therapy in a comprehensive inpatient rehab setting. Physiatrist is providing close team supervision and 24 hour management of active medical problems listed below. Physiatrist and rehab team continue to assess barriers to discharge/monitor patient progress toward functional and medical goals  Care Tool:  Bathing        Body parts bathed by helper: Right arm, Left arm, Chest, Abdomen, Front perineal area, Buttocks, Right upper leg, Left upper leg, Right lower leg, Left lower leg, Face     Bathing assist Assist Level: Total Assistance - Patient < 25% (nursing bath prior to therapy session)     Upper Body Dressing/Undressing Upper body dressing   What is the patient wearing?: Pull over shirt    Upper body assist Assist Level: Set up assist    Lower Body Dressing/Undressing Lower body dressing      What is the patient wearing?: Pants     Lower body assist Assist  for lower body dressing: Moderate Assistance - Patient 50 - 74%     Toileting Toileting    Toileting assist Assist for toileting: Maximal Assistance - Patient 25 - 49%     Transfers Chair/bed transfer  Transfers assist     Chair/bed transfer assist level: Minimal Assistance - Patient > 75%     Locomotion Ambulation   Ambulation assist      Assist level: Maximal Assistance - Patient 25 - 49% Assistive device: Walker-rolling Max distance: 3   Walk 10 feet activity   Assist  Walk 10 feet activity did not occur: Safety/medical concerns        Walk 50 feet activity   Assist Walk 50 feet with 2 turns activity did not occur: Safety/medical concerns         Walk 150 feet activity   Assist Walk 150 feet  activity did not occur: Safety/medical concerns         Walk 10 feet on uneven surface  activity   Assist Walk 10 feet on uneven surfaces activity did not occur: Safety/medical concerns         Wheelchair     Assist Is the patient using a wheelchair?: Yes Type of Wheelchair: Manual    Wheelchair assist level: Supervision/Verbal cueing Max wheelchair distance: 25    Wheelchair 50 feet with 2 turns activity    Assist        Assist Level: Moderate Assistance - Patient 50 - 74%   Wheelchair 150 feet activity     Assist      Assist Level: Dependent - Patient 0%   Blood pressure (!) 146/77, pulse 85, temperature 98.9 F (37.2 C), temperature source Oral, resp. rate 16, height 5' 4 (1.626 m), weight 73.9 kg, SpO2 96%.  Medical Problem List and Plan: 1. 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  Possible punctate infarct left middle cerebellar peduncle versus artifact.  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             -patient may  shower-cover L hip dressing with occlusive dressing             -ELOS/Goals: 13-15 days- mod I to supervision             -Continue CIR therapies including PT, OT  2.  Antithrombotics: -DVT/anticoagulation:  Pharmaceutical: Lovenox .  Check vascular study             -antiplatelet therapy: Aspirin  81 mg daily and Plavix  75 mg daily 3. Pain Management: Neurontin  800 mg 3 times daily, Voltaren  gel 4 g 4 times daily to affected area, oxycodone  5 mg every 4 hours as needed, tramadol  50 mg every 6 hours as needed, Robaxin  500 mg every 8 hours as needed- of note, doesn't like to take meds- encouraged her to do so prior to therapy 4. Mood/Behavior/Sleep: Wellbutrin  150 mg twice daily, trazodone  25 mg nightly as needed             -antipsychotic agents: N/A 5. Neuropsych/cognition:  This patient is capable of making decisions on her own behalf. 6. Skin/Wound Care: Routine skin checks 7. Fluids/Electrolytes/Nutrition: Routine in and outs with follow-up chemistries 8.  Left nondisplaced intertrochanteric hip fracture of the proximal left femur.  Status post IM nailing 11/19/2023.  Weightbearing as tolerated 9.  Acute blood loss anemia.  Follow-up CBC in am  9/8 10.  Hypertension.  Norvasc  10 mg daily, Coreg  25 mg twice daily, lisinopril  40 mg daily.     9/7 good control Vitals:   12/01/23 1934 12/02/23 0533  BP: (!) 160/73 (!) 146/77  Pulse: 80 85  Resp: 16 16  Temp: 98.2 F (36.8 C) 98.9 F (37.2 C)  SpO2: 94% 96%    11.  Diabetes mellitus.  Hemoglobin A1c 12.4.  Currently on Lantus  insulin  15 units every 24 hours.  Check blood sugars AC and at bedtime.  Prior to admission patient on Glucophage  500 mg daily, Trulicity weekly CBG (last 3)  Recent Labs    12/01/23 1702 12/01/23 2058 12/02/23 0552  GLUCAP 285* 342* 221*  Increase Lantus  to 20U 9/5- am CBG well controlled Metformin  started 9/5 Increase to 850mg  qam on 9/9- monitor results but may need to add novalog at meals Using 10-20U Novalog SSI per 24 h 12.  AKI on CKD stage IIIB.  Follow-up chemistries    Latest Ref Rng & Units 12/01/2023    5:27 AM 11/26/2023    4:52 AM 11/22/2023    6:22 AM  BMP  Glucose 70 - 99 mg/dL 871  866  878   BUN 8 - 23 mg/dL 30  15  8    Creatinine 0.44 - 1.00 mg/dL 8.61  8.81  9.07   Sodium 135 - 145 mmol/L 136  135  136   Potassium 3.5 - 5.1 mmol/L 4.3  4.3  3.4   Chloride 98 - 111 mmol/L 100  99  102   CO2 22 - 32 mmol/L 27  26  27    Calcium  8.9 - 10.3 mg/dL 9.6  9.0  8.4     13.  Tobacco abuse.  NicoDerm patch.  Provide counseling 14.  Hyperlipidemia.  Lipitor 15.  Constipation.  Adjust bowel program as needed- gave Sorbitol  30cc since lBM 4-5 days ago- if doesn't have good results, suggest Soap Suds enema.  16. Urinary retention with overflow- ordered bladder scans and to  cath q6 hours prn if bladder scan >350cc- also ordered U/A and Cx, and Flomax  0.4 mg q supper -at this time U/A is in process- I ordered in/out caths if volumes >350cc  + Citrobacter and Enterobacter No sensitivities to Keflex  Both sensitive to nitrofurantoin  as well as Bactrim po.  Given some elevation of Creat will rx nitrofurantoin  x 7d  17. Mild spasticity- monitor for symptoms 18. Of note, pt's mother is 65 years old and still doing functionally well! 19. Thrush- ordered nystatin  5ml q6 hours for mild thrush- might need Diflucan - monitor    20.  Left shoulder pain with hx of RCR s/p recent fall -  Xray with mild OA, pain improving with time/PT,OT--   -9/9 resolved   21.  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

## 2023-12-02 NOTE — Progress Notes (Signed)
 Occupational Therapy Session Note  Patient Details  Name: [name] MRN: [id number] Date of Birth: 11-Apr-1950  Today's Date: 12/02/2023 OT Individual Time: 9264-9173 OT Individual Time Calculation (min): 51 min    Short Term Goals: Week 1:  OT Short Term Goal 1 (Week 1): Patient will complete toilet transfer with mod assist OT Short Term Goal 2 (Week 1): Patient will don pants with mod assist OT Short Term Goal 3 (Week 1): Patient will complete toileting with mod assist  Skilled Therapeutic Interventions/Progress Updates:  Pt greeted supine in bed , pt agreeable to OT intervention.      Transfers/bed mobility/functional mobility: pt completed supine>sit with CGA. Pt completed sit>stand from EOB with RW and CGA, Min cues needed for hand placement. Pt completed stand pivots with RW and CGA.    ADLs:  Grooming: pt completed grooming at sink MODI UB dressing: donned bra and OH shirt with set- up assist.  LB dressing: pt donned pants with MODA d/t decreased ability to reach to BLEs Footwear: donned socks with total A for time mgmt, pt has used sock aid in the past and would benefit from practicing again  Bathing: pt completed bathing at sink with overall MINA. Pt declined washing periarea but able figure 4 to wash RLE, assist needed to wash LLE only.  Ended session with pt seated in w/c with all needs within reach and safety belt alarm activated.                    Therapy Documentation Precautions:  Precautions Precautions: Fall Recall of Precautions/Restrictions: Impaired Restrictions Weight Bearing Restrictions Per Provider Order: Yes LLE Weight Bearing Per Provider Order: Weight bearing as tolerated Other Position/Activity Restrictions: Patient has a boil on her buttocks that limits ability to sit  Pain: no pain     Therapy/Group: Individual Therapy  Ronal Gift [facility] 12/02/2023, 11:12 AM

## 2023-12-02 NOTE — Progress Notes (Signed)
 Physical Therapy Session Note  Patient Details  Name: [name] MRN: [id number] Date of Birth: 1950-09-06  Today's Date: 12/02/2023 PT Individual Time: 0905-1003; 1307 - 1347 PT Individual Time Calculation (min): 58 min; 40 min   Short Term Goals: Week 1:  PT Short Term Goal 1 (Week 1): Pt will transfer sup to sit w/ min A PT Short Term Goal 2 (Week 1): Pt will transfer sit to stand w/ min A consistently. PT Short Term Goal 3 (Week 1): Pt will amb x 25' w/ RW and min A  SESSION 1 Skilled Therapeutic Interventions/Progress Updates: Patient sitting in [facility] following nsg care on entrance to room. Patient alert and agreeable to PT session.   Patient reported no pain when to moving in chair, and unrated pain in L thigh when increasing physical activity. Pt provided with continued education to breathe through the pain and change in body mechanics noted below to decrease WB on L LE when pain increases.  Therapeutic Activity: Transfers: Pt performed sit<>stand transfers throughout session in preparation for functional mobility with close supervision and VC for hand placement and to extend L LE further than R when L LE has increased pain to increase WB compensation on R LE.  Therapeutic Exercise: Pt performed the following exercises with therapist providing the described cuing and facilitation for improvement. - Pt ambulated 9' x 1, 14' x 1 (after seated rest) in RW with WC follow for safety and CGA (pt has improved anterior lean bias this session vs last week). Pt cued to perform standing rest breaks vs sitting and to breath through the pain in L LE and to increase upright posture. Pt also cued to increase WB on UE's when in stance phase on L LE to avoid increase pain.  - Pt performed step to 4 step with L LE with B UE support on RW. Pt required VC to increase L hip flexion when stepping off of step vs sliding, and to weight shift to R. Pt required seated rest break. Pt then performed step to  1.5 step with R LE to increase WB tolerance/strength on L LE with pt doing so x 2 with B UE support and increased time to advance as pt fearful of pain and also reported increase in L LE giving out. PTA cued pt to perform same step to without L UE support with pt presenting with increase in hesitation that required L HHA on L and pt doing so with no increase in WB on PTA hand. Pt cued to perform again without L UE support with pt presenting with increase in hesitation, despite PTA blocking L knee to avoid buckling with pt ultimately requiring seated rest. - Manual resistance of LAQ/seated knee flexion (alternating) with cues to breath through  Patient sitting in WC at end of session with brakes locked, belt alarm set, and all needs within reach.\  SESSION 2 Skilled Therapeutic Interventions/Progress Updates: Patient sitting in WC on entrance to room. Patient alert and agreeable to PT session.   Therapeutic Activity: Bed Mobility: Pt performed supine<>sit on EOB with overall minA and VC to scoot towards HOB at 45* angle and to advance R LE onto bed with L UE on railing to act as leverage (able to self advance R LE, and required minA to advance L LE due to pain). Pt required VC to keep B LE's closer together when advancing off of bed due to reports of pain in L medial thigh with PTA providing minA to assist  with advancing and truncal elevation. Transfers: Pt performed sit<>stand pivot transfers throughout session with RW and with CGA and VC to increase WB on UE's when advancing R LE due to pain increase in L LE. Pt required increased time to transfer from WC<>EOB.   Therapeutic Exercise: Pt performed the following exercises with therapist providing the described cuing and facilitation for improvement. - Pt performed supine L hip abduction. Pt reported increase in pain/tightness around L medial thigh. Pt positioned with R knee in flexion and cues to engage core musculature and to breath through movement  (PTA originally passively moved LE through hip abduction with pt reporting decrease in pain/tightness). Pt performed x 8 L hip abduction to available AROM with PTA providing minA with pt presenting ability to have greater ROM following cues. - Pt ambulated roughly 10' in RW with CGA and VC to increase WB B UE to offset load on L LE. Pt cued to decrease step length as to avoid unnecessary increased stance time on L LE. Pt required seated rest break.  Patient sitting in WC at end of session with brakes locked, belt alarm set, and all needs within reach.       Therapy Documentation Precautions:  Precautions Precautions: Fall Recall of Precautions/Restrictions: Impaired Restrictions Weight Bearing Restrictions Per Provider Order: Yes LLE Weight Bearing Per Provider Order: Weight bearing as tolerated Other Position/Activity Restrictions: Patient has a boil on her buttocks that limits ability to sit  Therapy/Group: Individual Therapy  Mayo Owczarzak PTA 12/02/2023, 1:54 PM

## 2023-12-02 NOTE — Plan of Care (Signed)
   Problem: Health Behavior/Discharge Planning: Goal: Ability to manage health-related needs will improve Outcome: Progressing

## 2023-12-02 NOTE — Progress Notes (Signed)
 PROGRESS NOTE   Subjective/Complaints:  C/o constipation but no abd pain , would like a laxative after therapy   ROS: Patient denies fever, rash, sore throat, blurred vision, dizziness, nausea, vomiting, diarrhea, cough, shortness of breath or chest pain, joint or back/neck pain, headache, or mood change.     Objective:   No results found.  No results for input(s): WBC, HGB, HCT, PLT in the last 72 hours.  Recent Labs    12/01/23 0527  NA 136  K 4.3  CL 100  CO2 27  GLUCOSE 128*  BUN 30*  CREATININE 1.38*  CALCIUM  9.6     Intake/Output Summary (Last 24 hours) at 12/02/2023 0904 Last data filed at 12/02/2023 0846 Gross per 24 hour  Intake 240 ml  Output 1113 ml  Net -873 ml        Physical Exam: Vital Signs Blood pressure (!) 146/77, pulse 85, temperature 98.9 F (37.2 C), temperature source Oral, resp. rate 16, height 5' 4 (1.626 m), weight 73.9 kg, SpO2 96%.    General: No acute distress Mood and affect are appropriate Heart: Regular rate and rhythm no rubs murmurs or extra sounds Lungs: Clear to auscultation, breathing unlabored, no rales or wheezes Abdomen: Positive bowel sounds, soft nontender to palpation, nondistended Extremities: No clubbing, cyanosis, or edema   Skin: No evidence of breakdown, no evidence of rash Neurologic: Cranial nerves II through XII intact, motor strength is 5/5 in right deltoid, bicep, tricep, grip, hip flexor, knee extensors, ankle dorsiflexor and plantar flexor Left delt and biceps limited by pain but at least 3-/5  normal grip, WF, WE,  LLE limited by pain for HF, KE, 4/5 ankle PF and DF Sensory exam normal sensation to light touch and proprioception in bilateral upper and lower extremities. Prior neuro assessment is c/w 12/02/2023 exam.   Musculoskeletal: Reduced ROM Bilateral shoulder abd and flexion , left shoulder 75% range , right shoulder ~50% range, pain at  end range  No joint swelling  Left HF, KE limited by pain --stable exam   Assessment/Plan: 1. Functional deficits which require 3+ hours per day of interdisciplinary therapy in a comprehensive inpatient rehab setting. Physiatrist is providing close team supervision and 24 hour management of active medical problems listed below. Physiatrist and rehab team continue to assess barriers to discharge/monitor patient progress toward functional and medical goals  Care Tool:  Bathing        Body parts bathed by helper: Right arm, Left arm, Chest, Abdomen, Front perineal area, Buttocks, Right upper leg, Left upper leg, Right lower leg, Left lower leg, Face     Bathing assist Assist Level: Total Assistance - Patient < 25% (nursing bath prior to therapy session)     Upper Body Dressing/Undressing Upper body dressing   What is the patient wearing?: Pull over shirt    Upper body assist Assist Level: Set up assist    Lower Body Dressing/Undressing Lower body dressing      What is the patient wearing?: Pants     Lower body assist Assist for lower body dressing: Moderate Assistance - Patient 50 - 74%     Editor, commissioning  assist Assist for toileting: Maximal Assistance - Patient 25 - 49%     Transfers Chair/bed transfer  Transfers assist     Chair/bed transfer assist level: Minimal Assistance - Patient > 75%     Locomotion Ambulation   Ambulation assist      Assist level: Maximal Assistance - Patient 25 - 49% Assistive device: Walker-rolling Max distance: 3   Walk 10 feet activity   Assist  Walk 10 feet activity did not occur: Safety/medical concerns        Walk 50 feet activity   Assist Walk 50 feet with 2 turns activity did not occur: Safety/medical concerns         Walk 150 feet activity   Assist Walk 150 feet activity did not occur: Safety/medical concerns         Walk 10 feet on uneven surface  activity   Assist Walk 10  feet on uneven surfaces activity did not occur: Safety/medical concerns         Wheelchair     Assist Is the patient using a wheelchair?: Yes Type of Wheelchair: Manual    Wheelchair assist level: Supervision/Verbal cueing Max wheelchair distance: 25    Wheelchair 50 feet with 2 turns activity    Assist        Assist Level: Moderate Assistance - Patient 50 - 74%   Wheelchair 150 feet activity     Assist      Assist Level: Dependent - Patient 0%   Blood pressure (!) 146/77, pulse 85, temperature 98.9 F (37.2 C), temperature source Oral, resp. rate 16, height 5' 4 (1.626 m), weight 73.9 kg, SpO2 96%.  Medical Problem List and Plan: 1. 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  Possible punctate infarct left middle cerebellar peduncle versus artifact.  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             -patient may  shower-cover L hip dressing with occlusive dressing             -ELOS/Goals: 13-15 days- mod I to supervision             -Continue CIR therapies including PT, OT  2.  Antithrombotics: -DVT/anticoagulation:  Pharmaceutical: Lovenox .  Check vascular study             -antiplatelet therapy: Aspirin  81 mg daily and Plavix  75 mg daily 3. Pain Management: Neurontin  800 mg 3 times daily, Voltaren  gel 4 g 4 times daily to affected area, oxycodone  5 mg every 4 hours as needed, tramadol  50 mg every 6 hours as needed, Robaxin  500 mg every 8 hours as needed- of note, doesn't like to take meds- encouraged her to do so prior to therapy 4. Mood/Behavior/Sleep: Wellbutrin  150 mg twice daily, trazodone  25 mg nightly as needed             -antipsychotic agents: N/A 5. Neuropsych/cognition: This patient is capable of making decisions on her own behalf. 6. Skin/Wound Care: Routine skin checks 7.  Fluids/Electrolytes/Nutrition: Routine in and outs with follow-up chemistries 8.  Left nondisplaced intertrochanteric hip fracture of the proximal left femur.  Status post IM nailing 11/19/2023.  Weightbearing as tolerated 9.  Acute blood loss anemia.  Follow-up CBC in am 9/8 10.  Hypertension.  Norvasc  10 mg daily, Coreg  25 mg twice daily, lisinopril  40 mg daily.  9/7 good control Vitals:   12/01/23 1934 12/02/23 0533  BP: (!) 160/73 (!) 146/77  Pulse: 80 85  Resp: 16 16  Temp: 98.2 F (36.8 C) 98.9 F (37.2 C)  SpO2: 94% 96%    11.  Diabetes mellitus.  Hemoglobin A1c 12.4.  Currently on Lantus  insulin  15 units every 24 hours.  Check blood sugars AC and at bedtime.  Prior to admission patient on Glucophage  500 mg daily, Trulicity weekly CBG (last 3)  Recent Labs    12/01/23 1702 12/01/23 2058 12/02/23 0552  GLUCAP 285* 342* 221*  Increase Lantus  to 20U 9/5- am CBG well controlled Metformin  started 9/5 Increase to 850mg  qam on 9/9- monitor results but may need to add novalog at meals Using 10-20U Novalog SSI per 24 h 12.  AKI on CKD stage IIIB.  Follow-up chemistries    Latest Ref Rng & Units 12/01/2023    5:27 AM 11/26/2023    4:52 AM 11/22/2023    6:22 AM  BMP  Glucose 70 - 99 mg/dL 871  866  878   BUN 8 - 23 mg/dL 30  15  8    Creatinine 0.44 - 1.00 mg/dL 8.61  8.81  9.07   Sodium 135 - 145 mmol/L 136  135  136   Potassium 3.5 - 5.1 mmol/L 4.3  4.3  3.4   Chloride 98 - 111 mmol/L 100  99  102   CO2 22 - 32 mmol/L 27  26  27    Calcium  8.9 - 10.3 mg/dL 9.6  9.0  8.4     13.  Tobacco abuse.  NicoDerm patch.  Provide counseling 14.  Hyperlipidemia.  Lipitor 15.  Constipation.  Adjust bowel program as needed- gave Sorbitol  30cc since lBM 4-5 days ago- if doesn't have good results, suggest Soap Suds enema.  16. Urinary retention with overflow- ordered bladder scans and to cath q6 hours prn if bladder scan >350cc- also ordered U/A and Cx, and Flomax  0.4 mg q supper -at this  time U/A is in process- I ordered in/out caths if volumes >350cc  + Citrobacter and Enterobacter No sensitivities to Keflex  Both sensitive to nitrofurantoin  as well as Bactrim po.  Given some elevation of Creat will rx nitrofurantoin  x 7d  17. Mild spasticity- monitor for symptoms 18. Of note, pt's mother is 69 years old and still doing functionally well! 19. Thrush- ordered nystatin  5ml q6 hours for mild thrush- might need Diflucan - monitor    20.  Left shoulder pain with hx of RCR s/p recent fall -  Xray with mild OA, pain improving with time/PT,OT--   -9/9 resolved    LOS: 7 days A FACE TO FACE EVALUATION WAS PERFORMED  [name] 12/02/2023, 9:04 AM

## 2023-12-03 LAB — GLUCOSE, CAPILLARY
Glucose-Capillary: 136 mg/dL — ABNORMAL HIGH (ref 70–99)
Glucose-Capillary: 200 mg/dL — ABNORMAL HIGH (ref 70–99)
Glucose-Capillary: 160 mg/dL — ABNORMAL HIGH (ref 70–99)
Glucose-Capillary: 65 mg/dL — ABNORMAL LOW (ref 70–99)
Glucose-Capillary: 104 mg/dL — ABNORMAL HIGH (ref 70–99)

## 2023-12-03 MED ORDER — INSULIN ASPART 100 UNIT/ML IJ SOLN
3.0000 [IU] | Freq: Three times a day (TID) | INTRAMUSCULAR | Status: DC
  Administered 2023-12-03 – 2023-12-08 (×15): 3 [IU] via SUBCUTANEOUS

## 2023-12-03 MED ORDER — BETHANECHOL CHLORIDE 10 MG PO TABS
10.0000 mg | ORAL_TABLET | Freq: Three times a day (TID) | ORAL | Status: DC
  Administered 2023-12-03 – 2023-12-05 (×6): 10 mg via ORAL
  Filled 2023-12-03 (×6): qty 1

## 2023-12-03 NOTE — Plan of Care (Signed)
  Problem: Consults Goal: RH STROKE PATIENT EDUCATION Description: See Patient Education module for education specifics  12/03/2023 1100 by Aleta Mike, RN Outcome: Progressing 12/03/2023 1058 by Aleta Mike, RN Outcome: Progressing   Problem: RH BOWEL ELIMINATION Goal: RH STG MANAGE BOWEL WITH ASSISTANCE Description: STG Manage Bowel with mod I  Assistance. 12/03/2023 1100 by Aleta Mike, RN Outcome: Progressing 12/03/2023 1058 by Aleta Mike, RN Outcome: Progressing Goal: RH STG MANAGE BOWEL W/MEDICATION W/ASSISTANCE Description: STG Manage Bowel with Medication with mod I Assistance. 12/03/2023 1100 by Aleta Mike, RN Outcome: Progressing 12/03/2023 1058 by Aleta Mike, RN Outcome: Progressing   Problem: RH SAFETY Goal: RH STG ADHERE TO SAFETY PRECAUTIONS W/ASSISTANCE/DEVICE Description: STG Adhere to Safety Precautions With cues Assistance/Device. 12/03/2023 1100 by Aleta Mike, RN Outcome: Progressing 12/03/2023 1058 by Aleta Mike, RN Outcome: Progressing   Problem: RH PAIN MANAGEMENT Goal: RH STG PAIN MANAGED AT OR BELOW PT'S PAIN GOAL Description: Pain < 4 with prns 12/03/2023 1100 by Aleta Mike, RN Outcome: Progressing 12/03/2023 1058 by Aleta Mike, RN Outcome: Progressing   Problem: RH KNOWLEDGE DEFICIT Goal: RH STG INCREASE KNOWLEDGE OF DIABETES Description: Patient and family will be able to manage DM using educational resources for medications and dietary modification independently 12/03/2023 1100 by Aleta Mike, RN Outcome: Progressing 12/03/2023 1058 by Aleta Mike, RN Outcome: Progressing Goal: RH STG INCREASE KNOWLEDGE OF HYPERTENSION Description: Patient and family will be able to manage HTN using educational resources for medications and dietary modification independently 12/03/2023 1100 by Aleta Mike, RN Outcome: Progressing 12/03/2023 1058 by Aleta Mike, RN Outcome: Progressing Goal: RH STG INCREASE KNOWLEGDE OF  HYPERLIPIDEMIA Description: Patient and family will be able to manage HLD using educational resources for medications and dietary modification independently 12/03/2023 1100 by Aleta Mike, RN Outcome: Progressing 12/03/2023 1058 by Aleta Mike, RN Outcome: Progressing Goal: RH STG INCREASE KNOWLEDGE OF STROKE PROPHYLAXIS Description: Patient and family will be able to manage secondary risks using educational resources for medications and dietary modification independently 12/03/2023 1100 by Aleta Mike, RN Outcome: Progressing 12/03/2023 1058 by Aleta Mike, RN Outcome: Progressing

## 2023-12-03 NOTE — Patient Care Conference (Incomplete)
 Inpatient RehabilitationTeam Conference and Plan of Care Update Date: 12/03/2023   Time: 6:57 AM    Patient Name: [name]      Medical Record Number: [id number]  Date of Birth: 1950/04/12 Sex: Female         Room/Bed: 4W08C/4W08C-01 Payor Info: Payor: Advertising copywriter MEDICARE / Plan: [facility] MEDICARE / Product Type: *No Product type* /    Admit Date/Time:  11/25/2023  2:08 PM  Primary Diagnosis:  Embolic stroke [facility])  Hospital Problems: Principal Problem:   Embolic stroke (HCC) Active Problems:   Intertrochanteric fracture of left hip (HCC)   Pressure injury of skin   Cognitive and neurobehavioral dysfunction    Expected Discharge Date: Expected Discharge Date:  (Evals pending)  Team Members Present:       Current Status/Progress Goal Weekly Team Focus  Bowel/Bladder      ***          Swallow/Nutrition/ Hydration      ***         ADL's   set- up for UB ADLS, MODA for LB ADLs, MAX A for 3/3 toileting tasks, stand pivot transfers with RW and MINA, limited by pain and impaired cog/memory. family support at home?  *** MINA- MODI   pain mgmt, memory strategies for safety with ADLs, functional mobility, DME needs, family ed?    Mobility   Bed mobility = minA; Transfers = CGA (can stand to RW with close supervision); Ambulation = CGA up to 14'  *** overall modI  Barriers; Good d/c home, self limiting due to pain L LE; Focus = WB tolerance L LE, pain management, ambulation, transfers, bed mobility, dynamic standing balance    Communication      ***          Safety/Cognition/ Behavioral Observations  mod deficits in awareness, attention, problem solving, mod-severe w/ memory  *** minA   short term memory - use of memory book, sustained attention to task, basic problem solving, awareness    Pain      ***          Skin      ***           Discharge Planning:      Team Discussion: *** Patient on target to meet rehab goals: {IP REHAB YES/NO WITH  TPOIRJMID:[id number]}  *See Care Plan and progress notes for long and short-term goals.   Revisions to Treatment Plan:  ***  Teaching Needs: ***  Current Barriers to Discharge: {BARRIERS TO IPDRYJMHZ:[id number]}  Possible Resolutions to Barriers: ***     Medical Summary               I attest that I was present, lead the team conference, and concur with the assessment and plan of the team.   Joao Mccurdy 12/03/2023, 6:57 AM

## 2023-12-03 NOTE — Plan of Care (Signed)
  Problem: Consults Goal: RH STROKE PATIENT EDUCATION Description: See Patient Education module for education specifics  Outcome: Progressing   Problem: RH BOWEL ELIMINATION Goal: RH STG MANAGE BOWEL WITH ASSISTANCE Description: STG Manage Bowel with mod I  Assistance. Outcome: Progressing Goal: RH STG MANAGE BOWEL W/MEDICATION W/ASSISTANCE Description: STG Manage Bowel with Medication with mod I Assistance. Outcome: Progressing   Problem: RH SAFETY Goal: RH STG ADHERE TO SAFETY PRECAUTIONS W/ASSISTANCE/DEVICE Description: STG Adhere to Safety Precautions With cues Assistance/Device. Outcome: Progressing   Problem: RH PAIN MANAGEMENT Goal: RH STG PAIN MANAGED AT OR BELOW PT'S PAIN GOAL Description: Pain < 4 with prns Outcome: Progressing   Problem: RH KNOWLEDGE DEFICIT Goal: RH STG INCREASE KNOWLEDGE OF DIABETES Description: Patient and family will be able to manage DM using educational resources for medications and dietary modification independently Outcome: Progressing Goal: RH STG INCREASE KNOWLEDGE OF HYPERTENSION Description: Patient and family will be able to manage HTN using educational resources for medications and dietary modification independently Outcome: Progressing Goal: RH STG INCREASE KNOWLEGDE OF HYPERLIPIDEMIA Description: Patient and family will be able to manage HLD using educational resources for medications and dietary modification independently Outcome: Progressing Goal: RH STG INCREASE KNOWLEDGE OF STROKE PROPHYLAXIS Description: Patient and family will be able to manage secondary risks using educational resources for medications and dietary modification independently Outcome: Progressing

## 2023-12-03 NOTE — Progress Notes (Signed)
 Speech Language Pathology Weekly Progress and Session Note  Patient Details  Name: [name] MRN: [id number] Date of Birth: July 11, 1950  Beginning of progress report period: November 26, 2023 End of progress report period: December 03, 2023  Today's Date: 12/03/2023 SLP Individual Time: 8699-8654 SLP Individual Time Calculation (min): 45 min  Short Term Goals: Week 1: SLP Short Term Goal 1 (Week 1): Patient will demonstrate sustained attention during functional tasks for >5 minutes given mod multimodal A SLP Short Term Goal 1 - Progress (Week 1): Met SLP Short Term Goal 2 (Week 1): Patient will recall new, daily information given mod multimodal A SLP Short Term Goal 2 - Progress (Week 1): Met SLP Short Term Goal 3 (Week 1): Patient will demonstrate simple problem solving skills during functional tasks given min multimodal A SLP Short Term Goal 3 - Progress (Week 1): Met SLP Short Term Goal 4 (Week 1): Patient will demonstrate emergent awareness of mistakes given min verbal A SLP Short Term Goal 4 - Progress (Week 1): Met    New Short Term Goals: Week 2: SLP Short Term Goal 1 (Week 2): STG = LTG due to ELOS  Weekly Progress Updates: Pt has made great gains and has met 4 of 4 STGs this reporting period due to improved cognition. Currently, patient continues to require min-mod A for memory, attention, problem solving and awareness.  Pt/family eduction ongoing. Pt would benefit from continued ST intervention to maximize cognitive skills in order to maximize functional independence at d/c.    Intensity: Minumum of 1-2 x/day, 30 to 90 minutes Frequency: 3 to 5 out of 7 days Duration/Length of Stay: 9/17 Treatment/Interventions: Cognitive remediation/compensation;Internal/external aids;Cueing hierarchy;Functional tasks;Patient/family education;Therapeutic Activities   Daily Session  Skilled Therapeutic Interventions:  Skilled therapy session focused on cognitive goals. Upon  entrance, patient greeted therapist by name and shared activities completed in PT/OT this AM with minA. SLP targeted problem solving goals by prompting patient to complete simple mathematical calculations to ensure budget was kept from grocery task initiated yesterday. Patient required minA occasionally modA to complete due to difficulty reading handwriting. Patient continues to be tangential and verbose, though with improvements in short term memory this date. Patient left in [facility] with alarm set and call bell in reach. Continue POC.      Pain None reported to SLP  Therapy/Group: Individual Therapy  Vallen Calabrese M.A., CCC-SLP 12/03/2023, 12:10 PM

## 2023-12-03 NOTE — Progress Notes (Addendum)
 PROGRESS NOTE   Subjective/Complaints:  Discussed severity of left knee OA and need for TKR in future No other issues overnite  Still req intermittent I/O cath, per nsg just since UTI ROS: Patient denies fever, rash, sore throat, blurred vision, dizziness, nausea, vomiting, diarrhea, cough, shortness of breath or chest pain, joint or back/neck pain, headache, or mood change.     Objective:   No results found.  No results for input(s): WBC, HGB, HCT, PLT in the last 72 hours.  Recent Labs    12/01/23 0527  NA 136  K 4.3  CL 100  CO2 27  GLUCOSE 128*  BUN 30*  CREATININE 1.38*  CALCIUM  9.6     Intake/Output Summary (Last 24 hours) at 12/03/2023 0942 Last data filed at 12/02/2023 1900 Gross per 24 hour  Intake 480 ml  Output 218 ml  Net 262 ml        Physical Exam: Vital Signs Blood pressure 134/77, pulse 73, temperature 98.5 F (36.9 C), resp. rate 18, height 5' 4 (1.626 m), weight 73.9 kg, SpO2 95%.    General: No acute distress Mood and affect are appropriate Heart: Regular rate and rhythm no rubs murmurs or extra sounds Lungs: Clear to auscultation, breathing unlabored, no rales or wheezes Abdomen: Positive bowel sounds, soft nontender to palpation, nondistended Extremities: No clubbing, cyanosis, or edema   Skin: No evidence of breakdown, no evidence of rash Neurologic: Cranial nerves II through XII intact, motor strength is 5/5 in right deltoid, bicep, tricep, grip, hip flexor, knee extensors, ankle dorsiflexor and plantar flexor Left delt and biceps limited by pain but at least 3-/5  deltoid ,biceps, triceps  grip, WF, WE,  LLE limited by pain for HF, KE, 4/5 ankle PF and DF Sensory exam normal sensation to light touch and proprioception in bilateral upper and lower extremities. Prior neuro assessment is c/w 12/03/2023 exam.   Musculoskeletal: Reduced ROM Bilateral shoulder abd and flexion  , left shoulder 75% range , right shoulder ~50% range, pain at end range  No joint swelling  Left HF, KE limited by pain --stable exam Left knee contracture lack 20 deg from full ext, + bony enlargement of joint  Assessment/Plan: 1. Functional deficits which require 3+ hours per day of interdisciplinary therapy in a comprehensive inpatient rehab setting. Physiatrist is providing close team supervision and 24 hour management of active medical problems listed below. Physiatrist and rehab team continue to assess barriers to discharge/monitor patient progress toward functional and medical goals  Care Tool:  Bathing        Body parts bathed by helper: Right arm, Left arm, Chest, Abdomen, Front perineal area, Buttocks, Right upper leg, Left upper leg, Right lower leg, Left lower leg, Face     Bathing assist Assist Level: Minimal Assistance - Patient > 75%     Upper Body Dressing/Undressing Upper body dressing   What is the patient wearing?: Pull over shirt, Bra    Upper body assist Assist Level: Set up assist    Lower Body Dressing/Undressing Lower body dressing      What is the patient wearing?: Pants     Lower body assist Assist for lower  body dressing: Moderate Assistance - Patient 50 - 74%     Toileting Toileting    Toileting assist Assist for toileting: Maximal Assistance - Patient 25 - 49%     Transfers Chair/bed transfer  Transfers assist     Chair/bed transfer assist level: Minimal Assistance - Patient > 75%     Locomotion Ambulation   Ambulation assist      Assist level: Maximal Assistance - Patient 25 - 49% Assistive device: Walker-rolling Max distance: 3   Walk 10 feet activity   Assist  Walk 10 feet activity did not occur: Safety/medical concerns        Walk 50 feet activity   Assist Walk 50 feet with 2 turns activity did not occur: Safety/medical concerns         Walk 150 feet activity   Assist Walk 150 feet activity did not  occur: Safety/medical concerns         Walk 10 feet on uneven surface  activity   Assist Walk 10 feet on uneven surfaces activity did not occur: Safety/medical concerns         Wheelchair     Assist Is the patient using a wheelchair?: Yes Type of Wheelchair: Manual    Wheelchair assist level: Supervision/Verbal cueing Max wheelchair distance: 25    Wheelchair 50 feet with 2 turns activity    Assist        Assist Level: Moderate Assistance - Patient 50 - 74%   Wheelchair 150 feet activity     Assist      Assist Level: Dependent - Patient 0%   Blood pressure 134/77, pulse 73, temperature 98.5 F (36.9 C), resp. rate 18, height 5' 4 (1.626 m), weight 73.9 kg, SpO2 95%.  Medical Problem List and Plan: 1. 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  Possible punctate infarct left middle cerebellar peduncle versus artifact.  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             -patient may  shower-cover L hip dressing with occlusive dressing             -ELOS/Goals: 13-15 days- mod I to supervision           Team conference today please see physician documentation under team conference tab, met with team  to discuss problems,progress, and goals. Formulized individual treatment plan based on medical history, underlying problem and comorbidities.  2.  Antithrombotics: -DVT/anticoagulation:  Pharmaceutical: Lovenox .  Check vascular study             -antiplatelet therapy: Aspirin  81 mg daily and Plavix  75 mg daily 3. Pain Management: Neurontin  800 mg 3 times daily, Voltaren  gel 4 g 4 times daily to affected area, oxycodone  5 mg every 4 hours as needed, tramadol  50 mg every 6 hours as needed, Robaxin  500 mg every 8 hours as needed- of note, doesn't like to take meds- encouraged her to do so prior to therapy 4.  Mood/Behavior/Sleep: Wellbutrin  150 mg twice daily, trazodone  25 mg nightly as needed             -antipsychotic agents: N/A 5. Neuropsych/cognition: This patient is capable of making decisions on her own behalf. 6. Skin/Wound Care: Routine skin checks 7. Fluids/Electrolytes/Nutrition: Routine in and outs with follow-up chemistries 8.  Left nondisplaced intertrochanteric hip fracture of the proximal left femur.  Status post  IM nailing 11/19/2023.  Weightbearing as tolerated 9.  Acute blood loss anemia.  Follow-up CBC in am 9/8 10.  Hypertension.  Norvasc  10 mg daily, Coreg  25 mg twice daily, lisinopril  40 mg daily.     9/7 good control Vitals:   12/02/23 2014 12/03/23 0427  BP: 129/70 134/77  Pulse: 70 73  Resp: 18 18  Temp: (!) 97.5 F (36.4 C) 98.5 F (36.9 C)  SpO2: 97% 95%    11.  Diabetes mellitus.  Hemoglobin A1c 12.4.  Currently on Lantus  insulin  15 units every 24 hours.  Check blood sugars AC and at bedtime.  Prior to admission patient on Glucophage  500 mg daily, Trulicity weekly CBG (last 3)  Recent Labs    12/02/23 1708 12/02/23 2025 12/03/23 0621  GLUCAP 227* 291* 136*  Increase Lantus  to 20U 9/5- am CBG well controlled Metformin  started 9/5 D/C metformin  Start novalog 3U TID 12.  AKI on CKD stage IIIB.  Follow-up chemistries    Latest Ref Rng & Units 12/01/2023    5:27 AM 11/26/2023    4:52 AM 11/22/2023    6:22 AM  BMP  Glucose 70 - 99 mg/dL 871  866  878   BUN 8 - 23 mg/dL 30  15  8    Creatinine 0.44 - 1.00 mg/dL 8.61  8.81  9.07   Sodium 135 - 145 mmol/L 136  135  136   Potassium 3.5 - 5.1 mmol/L 4.3  4.3  3.4   Chloride 98 - 111 mmol/L 100  99  102   CO2 22 - 32 mmol/L 27  26  27    Calcium  8.9 - 10.3 mg/dL 9.6  9.0  8.4     13.  Tobacco abuse.  NicoDerm patch.  Provide counseling 14.  Hyperlipidemia.  Lipitor 15.  Constipation.  Adjust bowel program as needed- gave Sorbitol  30cc since lBM 4-5 days ago- if doesn't have good results, suggest Soap Suds enema.   16. Urinary retention with overflow- ordered bladder scans and to cath q6 hours prn if bladder scan >350cc- also ordered U/A and Cx, and Flomax  0.4 mg q supper -at this time U/A is in process- I ordered in/out caths if volumes >350cc  + Citrobacter and Enterobacter No sensitivities to Keflex  Both sensitive to nitrofurantoin  as well as Bactrim po.  Given some elevation of Creat will rx nitrofurantoin  x 7d  17. Mild spasticity- monitor for symptoms 18. Of note, pt's mother is 34 years old and still doing functionally well! 19. Thrush- ordered nystatin  5ml q6 hours for mild thrush- might need Diflucan - monitor    20.  Left shoulder pain with hx of RCR s/p recent fall -  Xray with mild OA, pain improving with time/PT,OT--   -9/9 resolved   21.  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

## 2023-12-03 NOTE — Progress Notes (Signed)
 Occupational Therapy Weekly Progress Note  Patient Details  Name: [name] MRN: [id number] Date of Birth: 1950-08-23  Beginning of progress report period: November 26, 2023 End of progress report period: December 03, 2023  Today's Date: 12/03/2023 OT Individual Time: 0830-0920 OT Individual Time Calculation (min): 50 min    Patient has met 3 of 3 short term goals.  Pt has made excellent progress this week and no longer has major limitations from dizziness. Her tolerance to pain and movement in L hip has also improved. She is now actively lifting her L arm and was able to get on her bra and shirt without help today.  Pt needs to continue to work on mobility for bathtub transfers and strength for increased independence with self care.   Patient continues to demonstrate the following deficits: muscle weakness and muscle joint tightness, unbalanced muscle activation and decreased coordination, decreased problem solving, decreased memory, and delayed processing, and decreased standing balance, hemiplegia, and decreased balance strategies and therefore will continue to benefit from skilled OT intervention to enhance overall performance with BADL.  Patient progressing toward long term goals..  Continue plan of care.  Problem: RH Bathing Goal: LTG Patient will bathe all body parts with assist levels (OT) Description: LTG: Patient will bathe all body parts with assist levels (OT) Flowsheets (Taken 12/03/2023 1221) LTG: Pt will perform bathing with assistance level/cueing: (LTG upgraded due to progress.) Supervision/Verbal cueing Note: LTG upgraded due to progress.    Problem: RH Dressing Goal: LTG Patient will perform lower body dressing w/assist (OT) Description: LTG: Patient will perform lower body dressing with assist, with/without cues in positioning using equipment (OT) Flowsheets (Taken 12/03/2023 1221) LTG: Pt will perform lower body dressing with assistance level of: (LTG upgraded  due to progress.) Contact Guard/Touching assist Note: LTG upgraded due to progress.    Problem: RH Toileting Goal: LTG Patient will perform toileting task (3/3 steps) with assistance level (OT) Description: LTG: Patient will perform toileting task (3/3 steps) with assistance level (OT)  Flowsheets (Taken 12/03/2023 1221) LTG: Pt will perform toileting task (3/3 steps) with assistance level: (LTG upgraded due to progress.) Contact Guard/Touching assist Note: LTG upgraded due to progress.    Problem: RH Tub/Shower Transfers Goal: LTG Patient will perform tub/shower transfers w/assist (OT) Description: LTG: Patient will perform tub/shower transfers with assist, with/without cues using equipment (OT) Flowsheets (Taken 12/03/2023 1221) LTG: Pt will perform tub/shower transfers from: Tub/shower combination   Problem: RH Tub/Shower Transfers Goal: LTG Patient will perform tub/shower transfers w/assist (OT) Description: LTG: Patient will perform tub/shower transfers with assist, with/without cues using equipment (OT) Flowsheets (Taken 12/03/2023 1221) LTG: Pt will perform tub/shower transfers from: Tub/shower combination             OT Short Term Goals Week 1:  OT Short Term Goal 1 (Week 1): Patient will complete toilet transfer with mod assist OT Short Term Goal 1 - Progress (Week 1): Met OT Short Term Goal 2 (Week 1): Patient will don pants with mod assist OT Short Term Goal 2 - Progress (Week 1): Met OT Short Term Goal 3 (Week 1): Patient will complete toileting with mod assist OT Short Term Goal 3 - Progress (Week 1): Met Week 2:  OT Short Term Goal 1 (Week 2): Pt  will complete toilet transfers with min A OT Short Term Goal 2 (Week 2): Pt will complete toileting with min A OT Short Term Goal 3 (Week 2): Pt will complete bathing with S. OT  Short Term Goal 4 (Week 2): Pt will complete tub bench transfer with mod A.  Skilled Therapeutic Interventions/Progress Updates:    Pt received in  bed and agreeable to bathing and dressing this morning. Asked pt if she needed to toilet first.  Pt moving much more efficiently today with less A. Pt continues to have pain in L hip but able to tolerate moving fairly well.  Pt transferred to wc and then toilet. Her brief was soaked (documented in flow sheet). Pt explained she was having a UTI.  Pt sat on BSC to finish toileting and bathing. Transferred to Wc to dress. See ADL documentation below.  Pt resting in wc with all needs met.  Belt alarm on, call light in reach.   Therapy Documentation Precautions:  Precautions Precautions: Fall Recall of Precautions/Restrictions: Impaired Restrictions Weight Bearing Restrictions Per Provider Order: Yes LLE Weight Bearing Per Provider Order: Weight bearing as tolerated Other Position/Activity Restrictions: Patient has a boil on her buttocks that limits ability to sit   Pain: Pain Assessment Pain Scale: 0-10 Pain Score: 5  Pain Location: Back Pain Intervention(s): Medication (See eMAR) ADL: ADL Eating: Set up Grooming: Setup Where Assessed-Grooming: Sitting at sink Upper Body Bathing: Supervision/safety Where Assessed-Upper Body Bathing: Other (Comment) (BSC) Lower Body Bathing: Moderate assistance Where Assessed-Lower Body Bathing: Other (Comment) (BSC) Upper Body Dressing: Supervision/safety Where Assessed-Upper Body Dressing: Wheelchair Lower Body Dressing: Moderate assistance Where Assessed-Lower Body Dressing: Wheelchair Toileting: Moderate assistance Where Assessed-Toileting: Teacher, adult education: Curator Method: Surveyor, minerals: Raised toilet seat, Grab bars Tub/Shower Transfer: Unable to assess Tub/Shower Transfer Method: Unable to assess [employer] Transfer: Unable to assess [employer] Method: Unable to assess   Therapy/Group: Individual Therapy  Dejion Grillo 12/03/2023, 12:42 PM

## 2023-12-03 NOTE — Progress Notes (Signed)
 Physical Therapy Session Note  Patient Details  Name: [name] MRN: [id number] Date of Birth: 03-Apr-1950  Today's Date: 12/03/2023 PT Individual Time: 8954-8884 PT Individual Time Calculation (min): 30 min  Today's Date: 12/03/2023 PT Missed Time: 15 Minutes Missed Time Reason: Nursing care  Short Term Goals: Week 1:  PT Short Term Goal 1 (Week 1): Pt will transfer sup to sit w/ min A PT Short Term Goal 2 (Week 1): Pt will transfer sit to stand w/ min A consistently. PT Short Term Goal 3 (Week 1): Pt will amb x 25' w/ RW and min A  Skilled Therapeutic Interventions/Progress Updates: Patient sitting in [facility] following personal care with nsg on entrance to room. Patient alert and agreeable to PT session.   Patient reported unrated pain in L LE (posterior musculature) with rest breaks and standing mobility performed to assist.  Therapeutic Activity: Transfers: Pt performed sit<>stand transfers throughout session with RW and with close supervision and VC to reach back to control descent.  Therapeutic Exercise: Pt performed the following exercises with therapist providing the described cuing and facilitation for improvement. - Pt ambulated 17' in RW with WC follow and CGA. Pt cued to increase WB on UE when L LE increases in pain when advancing R LE through swing phase. Pt required increased time to cover distance and added cuing to increase upright standing posture. Pt noted to have slightly observable increase in cadence this session vs previous sessions. Pt required seated rest break - Pt performed step to 2 step with R LE in order to increase WB tolerance on L LE. Pt required CGA to step to step, but minA when stepping off of step due to slight buckling noted on L LE (L>R). Pt performed x 3 with added cuing to increase WB on UE to assist with decreasing pain in L LE. Pt required seated rest break  Patient sitting in WC at end of session with brakes locked, belt alarm set, and all needs  within reach.      Therapy Documentation Precautions:  Precautions Precautions: Fall Recall of Precautions/Restrictions: Impaired Restrictions Weight Bearing Restrictions Per Provider Order: Yes LLE Weight Bearing Per Provider Order: Weight bearing as tolerated Other Position/Activity Restrictions: Patient has a boil on her buttocks that limits ability to sit  Therapy/Group: Individual Therapy  Choya Tornow PTA 12/03/2023, 12:31 PM

## 2023-12-03 NOTE — Progress Notes (Signed)
 Patient ID: [name], female   DOB: 1950/10/27, 74 y.o.   MRN: [id number]  Met with pt and her husband is present to give her team conference update. Pt gave worker permission to speak in front of husband, since separated. Discussed goals of CGA with some min assist and her need to have someone at home with her 24/7. She reports her son's have told her they can not take care of her and she would need to go to a NH for her care. Discussed will try to get coverage for her to go to a SNF. She would like to go to one in [place] due to she lives there. Will start the process.

## 2023-12-03 NOTE — Patient Care Conference (Signed)
 Inpatient RehabilitationTeam Conference and Plan of Care Update Date: 12/03/2023   Time: 10:22 AM    Patient Name: [name]      Medical Record Number: [id number]  Date of Birth: 05/08/1950 Sex: Female         Room/Bed: 4W08C/4W08C-01 Payor Info: Payor: [employer]vertising copy[employer] MEDICARE / Plan: [facility] MEDICARE / Product Type: *No Product type* /    Admit Date/Time:  11/25/2023  2:08 PM  Primary Diagnosis:  Embolic stroke [facility])  Hospital Problems: Principal Problem:   Embolic stroke [facility]) Active Problems:   Intertrochanteric fracture of left hip (HCC)   Pressure injury of skin   Cognitive and neurobehavioral dysfunction    Expected Discharge Date: Expected Discharge Date: 12/10/23  Team Members Present: Physician leading conference: Dr. Prentice Compton Social Worker Present: Rhoda Clement, LCSW Nurse Present: Barnie Ronde, RN PT Present: Sherlean Perks, PT;Dominic Freddi, PTA OT Present: Recardo Maxwell, OT SLP Present: Blaise Alderman, SLP     Current Status/Progress Goal Weekly Team Focus  Bowel/Bladder   Pt is continent x2.   Make sure pt's call bell answered on time, to prevent incontinency.   Skin will be prevented from PUs.    Swallow/Nutrition/ Hydration               ADL's   set- up for UB ADLS, MODA for LB ADLs, MAX A for 3/3 toileting tasks, stand pivot transfers with RW and MINA, limited by pain and impaired cog/memory. family support at home?   MINA- MODI   pain mgmt, memory strategies for safety with ADLs, functional mobility, DME needs, family ed?    Mobility   Bed mobility = minA; Transfers = CGA (can stand to RW with close supervision); Ambulation = CGA up to 14'   overall modI  Barriers; Good d/c home, self limiting due to pain L LE; Focus = WB tolerance L LE, pain management, ambulation, transfers, bed mobility, dynamic standing balance    Communication                Safety/Cognition/ Behavioral Observations  mod deficits in awareness,  attention, problem solving, mod-severe w/ memory   minA   short term memory - use of memory book, sustained attention to task, basic problem solving, awareness    Pain   Pt denies pain and discomfort.   Not in distress. Address pt's pain.   Medications on order to address pt's pain.    Skin   N/A              Discharge Planning:  Pt wants to go home limited support at home-mom at [employer] during the day and is 71 yo, son works during the day and brother comes at night to care for their mom. Goals PT-Mod/i and OT-mod/max big variation in goals. Does have attention issues. With such high level for mobility insurance probably would not cover SNF and pt doesn't want to go to one, she also has managed medicare plan   Team Discussion: Patient admitted post CVA with left shoulder pain and hip pain/tightness. New onset incontinence; requiring I+O cath for elimination of bladder with a UTI.  Patient on target to meet rehab goals: yes, currently needs min assist for ADLs. Needs CGA - close supervision for sit -stand but able to ambulate up to 14'. Goals for discharge set for CGA - Mod I overall with min assist for cognition.  *See Care Plan and progress notes for long and short-term goals.   Revisions to Treatment  Plan:  UTI treated Memory book initiation   Teaching Needs: Safety, medications including insulin  administration, CBG monitoring, dietary modification, transfers, toileting, etc.   Current Barriers to Discharge: Home enviroment access/layout and Lack of/limited family support  Possible Resolutions to Barriers: Family education Medicaid application pending     Medical Summary Current Status: improved pain in Left shoulder , still has hip pain from fracture, Left shoulder xray unremarkable, reduce fluid intake  Barriers to Discharge: Medical stability   Possible Resolutions to [employer] Focus: adjust pain meds,encourage oral intake monitor BMET   Continued Need for  Acute Rehabilitation Level of Care: The patient requires daily medical management by a physician with specialized training in physical medicine and rehabilitation for the following reasons: Direction of a multidisciplinary physical rehabilitation program to maximize functional independence : Yes Medical management of patient stability for increased activity during participation in an intensive rehabilitation regime.: Yes Analysis of laboratory values and/or radiology reports with any subsequent need for medication adjustment and/or medical intervention. : Yes   I attest that I was present, lead the team conference, and concur with the assessment and plan of the team.   Fredericka Sober B 12/03/2023, 4:42 PM

## 2023-12-03 NOTE — Progress Notes (Signed)
 Occupational Therapy Session Note  Patient Details  Name: [name] MRN: [id number] Date of Birth: [birth year]  Today's Date: 12/03/2023 OT Individual Time: 1417-1530 OT Individual Time Calculation (min): 73 min    Short Term Goals: Week 1:  OT Short Term Goal 1 (Week 1): Patient will complete toilet transfer with mod assist OT Short Term Goal 2 (Week 1): Patient will don pants with mod assist OT Short Term Goal 3 (Week 1): Patient will complete toileting with mod assist  Skilled Therapeutic Interventions/Progress Updates:     Pt received sitting up in wc dressed and ready for the day upon OT arrival, politely declining need for ADLs this session. Pt presenting to be in good spirits receptive to skilled OT session reporting pain in her L hip without numerical value given- OT offering intermittent rest breaks, repositioning, and therapeutic support to optimize participation in therapy session. Focused this session on L UE NMR to improved overall functional use, functional transfer training, and ADL retraining. Pt easily distracted and self limiting throughout session requiring consistent verbal cues for attention to task, increased time for task initiation, and increased time for rest breaks. MAX therapeutic support and encouragement provided. Transported Pt to therapy gym total A in wc for energy conservation. Stand pivot wc > EOM using RW MIN A +increased time and verbal/tactile cues for weight shifting. Pt noted to have muscle tightness in L pectoralis, anterior deltoid, and traps. Engaged Pt in completing gentle chest opener, shoulder internal/external rotation, and lateral neck flexion stretches with decreased stiffness noted following. Worked on scapular mobilization in combination with functional reaching with OT facilitating scapular rotation and providing verbal/tactile cues for trunk positioning and reaching pattern. Assisted Pt ot supine MOD A d/t pain in L hip. Engaged Pt in  completing shoulder flexion and chest press with 1# weighted dowel and chest flies with 0.5# wrist weights donned to facilitate NMR to L UE. Worked on coordinating B UE movements for improved motor learning. OT providing verbal/tactile cues for technique and muscle engagement. Pt then requesting to use restroom. Supine > EOM MAX A. Stand pivot EOM > wc using RW MIN A for balance +verbal cues for technique. Transported Pt back to room via wc. Stand pivot wc > elevated toilet seat using RW MIN A. Pt able to doff pants from waist with MIN A for standing balance. Increased time provided on toilet d/t need for BM. Handed Pt off to NT at end of session to address toileting needs.      Therapy Documentation Precautions:  Precautions Precautions: Fall Recall of Precautions/Restrictions: Impaired Restrictions Weight Bearing Restrictions Per Provider Order: Yes LLE Weight Bearing Per Provider Order: Weight bearing as tolerated Other Position/Activity Restrictions: Patient has a boil on her buttocks that limits ability to sit  Therapy/Group: Individual Therapy  Katheryn SHAUNNA [place] 12/03/2023, 7:53 AM

## 2023-12-03 NOTE — Plan of Care (Signed)
  Problem: RH Bathing Goal: LTG Patient will bathe all body parts with assist levels (OT) Description: LTG: Patient will bathe all body parts with assist levels (OT) Flowsheets (Taken 12/03/2023 1221) LTG: Pt will perform bathing with assistance level/cueing: (LTG upgraded due to progress.) Supervision/Verbal cueing Note: LTG upgraded due to progress.    Problem: RH Dressing Goal: LTG Patient will perform lower body dressing w/assist (OT) Description: LTG: Patient will perform lower body dressing with assist, with/without cues in positioning using equipment (OT) Flowsheets (Taken 12/03/2023 1221) LTG: Pt will perform lower body dressing with assistance level of: (LTG upgraded due to progress.) Contact Guard/Touching assist Note: LTG upgraded due to progress.    Problem: RH Toileting Goal: LTG Patient will perform toileting task (3/3 steps) with assistance level (OT) Description: LTG: Patient will perform toileting task (3/3 steps) with assistance level (OT)  Flowsheets (Taken 12/03/2023 1221) LTG: Pt will perform toileting task (3/3 steps) with assistance level: (LTG upgraded due to progress.) Contact Guard/Touching assist Note: LTG upgraded due to progress.    Problem: RH Tub/Shower Transfers Goal: LTG Patient will perform tub/shower transfers w/assist (OT) Description: LTG: Patient will perform tub/shower transfers with assist, with/without cues using equipment (OT) Flowsheets (Taken 12/03/2023 1221) LTG: Pt will perform tub/shower transfers from: Tub/shower combination   Problem: RH Tub/Shower Transfers Goal: LTG Patient will perform tub/shower transfers w/assist (OT) Description: LTG: Patient will perform tub/shower transfers with assist, with/without cues using equipment (OT) Flowsheets (Taken 12/03/2023 1221) LTG: Pt will perform tub/shower transfers from: Tub/shower combination

## 2023-12-04 LAB — GLUCOSE, CAPILLARY
Glucose-Capillary: 153 mg/dL — ABNORMAL HIGH (ref 70–99)
Glucose-Capillary: 165 mg/dL — ABNORMAL HIGH (ref 70–99)
Glucose-Capillary: 121 mg/dL — ABNORMAL HIGH (ref 70–99)
Glucose-Capillary: 144 mg/dL — ABNORMAL HIGH (ref 70–99)
Glucose-Capillary: 191 mg/dL — ABNORMAL HIGH (ref 70–99)

## 2023-12-04 NOTE — Plan of Care (Signed)
  Problem: Consults Goal: RH STROKE PATIENT EDUCATION Description: See Patient Education module for education specifics  Outcome: Progressing   Problem: RH BOWEL ELIMINATION Goal: RH STG MANAGE BOWEL WITH ASSISTANCE Description: STG Manage Bowel with mod I  Assistance. Outcome: Progressing Goal: RH STG MANAGE BOWEL W/MEDICATION W/ASSISTANCE Description: STG Manage Bowel with Medication with mod I Assistance. Outcome: Progressing   Problem: RH SAFETY Goal: RH STG ADHERE TO SAFETY PRECAUTIONS W/ASSISTANCE/DEVICE Description: STG Adhere to Safety Precautions With cues Assistance/Device. Outcome: Progressing   Problem: RH PAIN MANAGEMENT Goal: RH STG PAIN MANAGED AT OR BELOW PT'S PAIN GOAL Description: Pain < 4 with prns Outcome: Progressing   Problem: RH KNOWLEDGE DEFICIT Goal: RH STG INCREASE KNOWLEDGE OF DIABETES Description: Patient and family will be able to manage DM using educational resources for medications and dietary modification independently Outcome: Progressing Goal: RH STG INCREASE KNOWLEDGE OF HYPERTENSION Description: Patient and family will be able to manage HTN using educational resources for medications and dietary modification independently Outcome: Progressing Goal: RH STG INCREASE KNOWLEGDE OF HYPERLIPIDEMIA Description: Patient and family will be able to manage HLD using educational resources for medications and dietary modification independently Outcome: Progressing Goal: RH STG INCREASE KNOWLEDGE OF STROKE PROPHYLAXIS Description: Patient and family will be able to manage secondary risks using educational resources for medications and dietary modification independently Outcome: Progressing

## 2023-12-04 NOTE — Progress Notes (Signed)
 Speech Language Pathology Daily Session Note  Patient Details  Name: [name] MRN: [id number] Date of Birth: 11/25/1950  Today's Date: 12/04/2023 SLP Individual Time: 8968-8884 SLP Individual Time Calculation (min): 44 min  Short Term Goals: Week 2: SLP Short Term Goal 1 (Week 2): STG = LTG due to ELOS  Skilled Therapeutic Interventions: Skilled therapy session focused on cognitive goals. SLP facilitated session by prompting patient to complete memory book. Patient recalled activities completed in OT indepenedently and PT with minA. SLP then targeted problem solving through gift shop task. Patient created budget of $20 and was instructed to choose item for self, friend and child. Patient utilized addition to ensure adherance to budget and required supervisionA. Patient left in [facility] with alarm set and call bell in reach. Continue POC.   Pain None reported   Therapy/Group: Individual Therapy  Ermie Glendenning M.A., CCC-SLP 12/04/2023, 7:42 AM

## 2023-12-04 NOTE — Progress Notes (Signed)
 Physical Therapy Session Note  Patient Details  Name: [name] MRN: [id number] Date of Birth: April 29, 1950  Today's Date: 12/04/2023 PT Individual Time: 0930-1000, 1430-1500 PT Individual Time Calculation (min): 30 min, 30 min   Short Term Goals: Week 1:  PT Short Term Goal 1 (Week 1): Pt will transfer sup to sit w/ min A PT Short Term Goal 2 (Week 1): Pt will transfer sit to stand w/ min A consistently. PT Short Term Goal 3 (Week 1): Pt will amb x 25' w/ RW and min A  Skilled Therapeutic Interventions/Progress Updates:  AM Session: Pt received upright in WC, agreeable to therapy w/ min encouragement. Pt ambulated ~11ft w/ WC follow before experiencing LOB to L side. Pt required maxA to prevent fall, WC follow to sit w/ maxA safely. Pt reported episode of vertigo just seconds before falling. Pt demonstrated no balance strategies to prevent fall from occurring. Pt given foot ergometer to improve functional activity and BLE endurance. Pt performed 7 minutes w/ supervision. Pt reported R hip/groin pain w/ forward propulsion, tolerated backwards propulsion. Pt demonstrated ability to pull feet out of straps independently. Pt willing to perform foot ergometer between therapy sessions.  Pt left in [facility] w/ call bell, tray table, and all other needs in reach.    PM Session: Pt recd in care of nsg getting to bathroom. PT took over care to complete toileting, required supervision for hygiene and clothing management, assist only to tighten brief. Continent bladder void, documented in flow sheet. Stand pivot transfer using grab bar with CGA to return to w/c, hand hygiene at w/c level.   Pt c/o pain in LLE, unrated. Addressed w/ manual therapy. Soft-tissue mobilization and vibration therapy performed on L gastrocnemius, L biceps femoris tendon, and posterior to L hip incision. Pt reported relief from vibration therapy.  Pt remained upright in WC at end of session, given call bell and all other needs in  reach.   Therapy Documentation Precautions:  Precautions Precautions: Fall Recall of Precautions/Restrictions: Impaired Restrictions Weight Bearing Restrictions Per Provider Order: Yes LLE Weight Bearing Per Provider Order: Weight bearing as tolerated Other Position/Activity Restrictions: Patient has a boil on her buttocks that limits ability to sit   Therapy/Group: Individual Therapy  Oneil Grumbles 12/04/2023, 12:46 PM

## 2023-12-04 NOTE — NC FL2 (Signed)
 [place]  MEDICAID FL2 LEVEL OF CARE FORM     IDENTIFICATION  Patient Name: [name] Birthdate: Jul 17, 1950 Sex: female Admission Date (Current Location): 11/25/2023  [facility] and [place] Number:  Chiropodist and Address:  [facility] [place], [address], [place], [place] [address]      Provider Number: [id number]  Attending Physician Name and Address:  Carilyn Prentice BRAVO, MD  Relative Name and Phone Number:  [place] [phone]    Current Level of Care: Other (Comment) (rehab) Recommended Level of Care: Skilled Nursing Facility Prior Approval Number:    Date Approved/Denied:   PASRR Number: [id number]  Discharge Plan: SNF    Current Diagnoses: Patient Active Problem List   Diagnosis Date Noted   Cognitive and neurobehavioral dysfunction 11/28/2023   Intertrochanteric fracture of left hip (HCC) 11/25/2023   Embolic stroke (HCC) 11/25/2023   Pressure injury of skin 11/25/2023   Closed left hip fracture, initial encounter (HCC) 11/19/2023   Essential hypertension 11/19/2023   Type 2 diabetes mellitus with peripheral neuropathy (HCC) 11/19/2023   Tobacco abuse 11/19/2023   Dyslipidemia 11/19/2023   Type 2 diabetes mellitus with hypoglycemia without coma, with long-term current use of insulin  (HCC) 05/27/2021   Hypertension 05/27/2021   Cervical spine degeneration 05/27/2021   Aortic atherosclerosis (HCC) 05/27/2021   Hypomagnesemia 05/27/2021   Syncope 05/26/2021    Orientation RESPIRATION BLADDER Height & Weight     Self, Time, Situation, Place  Normal Continent Weight: 162 lb 14.7 oz (73.9 kg) Height:  5' 4 (162.6 cm)  BEHAVIORAL SYMPTOMS/MOOD NEUROLOGICAL BOWEL NUTRITION STATUS      Continent Diet (Carb mod thin liquids)  AMBULATORY STATUS COMMUNICATION OF NEEDS Skin   Limited Assist Verbally Surgical wounds                       Personal Care Assistance Level of Assistance  Bathing, Dressing Bathing  Assistance: Limited assistance   Dressing Assistance: Limited assistance     Functional Limitations Info             SPECIAL CARE FACTORS FREQUENCY  PT (By licensed PT), OT (By licensed OT), Speech therapy     PT Frequency: 5x week OT Frequency: 5x week     Speech Therapy Frequency: 5x week      Contractures Contractures Info: Not present    Additional Factors Info  Code Status, Allergies Code Status Info: DNR Allergies Info: Honey Bee venom, Hydrocodone , Mercury Phenylmercuri nitrate           Current Medications (12/04/2023):  This is the current hospital active medication list Current Facility-Administered Medications  Medication Dose Route Frequency Provider Last Rate Last Admin   acetaminophen  (TYLENOL ) tablet 325-650 mg  325-650 mg Oral Q6H PRN Angiulli, Daniel J, PA-C       amLODipine  (NORVASC ) tablet 10 mg  10 mg Oral Daily Angiulli, Daniel J, PA-C   10 mg at 12/04/23 9178   aspirin  EC tablet 81 mg  81 mg Oral Daily Angiulli, Daniel J, PA-C   81 mg at 12/04/23 9178   atorvastatin  (LIPITOR) tablet 40 mg  40 mg Oral Daily Angiulli, Daniel J, PA-C   40 mg at 12/04/23 9178   bethanechol  (URECHOLINE ) tablet 10 mg  10 mg Oral TID Carilyn Prentice BRAVO, MD   10 mg at 12/04/23 9178   buPROPion  (WELLBUTRIN  SR) 12 hr tablet 150 mg  150 mg Oral BID Angiulli, Daniel J, PA-C  150 mg at 12/04/23 9177   calcium -vitamin D (OSCAL WITH D) 500-5 MG-MCG per tablet 1 tablet  1 tablet Oral Daily Angiulli, Daniel J, PA-C   1 tablet at 12/04/23 9177   carvedilol  (COREG ) tablet 25 mg  25 mg Oral BID WC AngiulliToribio PARAS, PA-C   25 mg at 12/04/23 9178   clopidogrel  (PLAVIX ) tablet 75 mg  75 mg Oral Daily Angiulli, Daniel J, PA-C   75 mg at 12/04/23 9178   cyanocobalamin  (VITAMIN B12) tablet 1,000 mcg  1,000 mcg Oral Daily Angiulli, Daniel J, PA-C   1,000 mcg at 12/04/23 9178   diclofenac  Sodium (VOLTAREN ) 1 % topical gel 4 g  4 g Topical QID [place] Toribio PARAS, PA-C   4 g at 12/04/23 9176    docusate sodium  (COLACE) capsule 200 mg  200 mg Oral BID Carilyn Prentice BRAVO, MD   200 mg at 12/04/23 9178   enoxaparin  (LOVENOX ) injection 40 mg  40 mg Subcutaneous Q24H Pegge Toribio PARAS, PA-C   40 mg at 12/04/23 9178   gabapentin  (NEURONTIN ) capsule 800 mg  800 mg Oral TID Angiulli, Daniel J, PA-C   800 mg at 12/04/23 9178   insulin  aspart (novoLOG ) injection 0-9 Units  0-9 Units Subcutaneous TID AC & HS Angiulli, Daniel J, PA-C   2 Units at 12/04/23 9351   insulin  aspart (novoLOG ) injection 3 Units  3 Units Subcutaneous TID WC Carilyn Prentice BRAVO, MD   3 Units at 12/04/23 9175   insulin  glargine (LANTUS ) injection 20 Units  20 Units Subcutaneous Q24H Carilyn Prentice BRAVO, MD   20 Units at 12/03/23 1143   lactulose  (CHRONULAC ) 10 GM/15ML solution 30 g  30 g Oral BID PRN Angiulli, Daniel J, PA-C       lidocaine  (XYLOCAINE ) 2 % jelly 1 Application  1 Application Urethral PRN Lovorn, Megan, MD   1 Application at 11/25/23 1500   lisinopril  (ZESTRIL ) tablet 40 mg  40 mg Oral Daily Angiulli, Daniel J, PA-C   40 mg at 12/04/23 9178   methocarbamol  (ROBAXIN ) tablet 500 mg  500 mg Oral Q8H PRN Angiulli, Daniel J, PA-C   500 mg at 11/29/23 9158   multivitamin with minerals tablet 1 tablet  1 tablet Oral Daily Angiulli, Daniel J, PA-C   1 tablet at 12/04/23 9178   nicotine  (NICODERM CQ  - dosed in mg/24 hours) patch 21 mg  21 mg Transdermal Daily Angiulli, Daniel J, PA-C   21 mg at 12/04/23 9176   nitrofurantoin  (macrocrystal-monohydrate) (MACROBID ) capsule 100 mg  100 mg Oral Q12H Carilyn Prentice BRAVO, MD   100 mg at 12/04/23 9177   ondansetron  (ZOFRAN ) tablet 4 mg  4 mg Oral Q6H PRN Angiulli, Daniel J, PA-C       Or   ondansetron  (ZOFRAN ) injection 4 mg  4 mg Intravenous Q6H PRN Angiulli, Daniel J, PA-C       oxyCODONE  (Oxy IR/ROXICODONE ) immediate release tablet 5 mg  5 mg Oral Q4H PRN AngiulliToribio PARAS, PA-C   5 mg at 11/30/23 1244   tamsulosin  (FLOMAX ) capsule 0.4 mg  0.4 mg Oral QPC supper Lovorn,  Megan, MD   0.4 mg at 12/03/23 1712   traMADol  (ULTRAM ) tablet 50 mg  50 mg Oral Q6H Kirsteins, Prentice BRAVO, MD   50 mg at 12/04/23 9355   traZODone  (DESYREL ) tablet 25 mg  25 mg Oral QHS PRN Angiulli, Daniel J, PA-C   25 mg at 12/03/23 2041     Discharge Medications: Please  see discharge summary for a list of discharge medications.  Relevant Imaging Results:  Relevant Lab Results:   Additional Information SSN: [id number]  Kauri Garson, [place] MATSU, LCSW

## 2023-12-04 NOTE — Progress Notes (Signed)
 PROGRESS NOTE   Subjective/Complaints:  Pt in good spirits. Denies bladder issues now. Pain controlled. Working on UE in bed when I entered  ROS: Patient denies fever, rash, sore throat, blurred vision, dizziness, nausea, vomiting, diarrhea, cough, shortness of breath or chest pain, joint or back/neck pain, headache, or mood change.     Objective:   No results found.  No results for input(s): WBC, HGB, HCT, PLT in the last 72 hours.  No results for input(s): NA, K, CL, CO2, GLUCOSE, BUN, CREATININE, CALCIUM  in the last 72 hours.    Intake/Output Summary (Last 24 hours) at 12/04/2023 1036 Last data filed at 12/04/2023 0824 Gross per 24 hour  Intake 773 ml  Output --  Net 773 ml        Physical Exam: Vital Signs Blood pressure 114/60, pulse 70, temperature 98.3 F (36.8 C), temperature source Oral, resp. rate 18, height 5' 4 (1.626 m), weight 73.9 kg, SpO2 98%.    Constitutional: No distress . Vital signs reviewed. HEENT: NCAT, EOMI, oral membranes moist Neck: supple Cardiovascular: RRR without murmur. No JVD    Respiratory/Chest: CTA Bilaterally without wheezes or rales. Normal effort    GI/Abdomen: BS +, non-tender, non-distended Ext: no clubbing, cyanosis, or edema Psych: pleasant and cooperative  Skin: No evidence of breakdown, no evidence of rash Neurologic: Cranial nerves II through XII intact, motor strength is 5/5 in right deltoid, bicep, tricep, grip, hip flexor, knee extensors, ankle dorsiflexor and plantar flexor Left delt and biceps limited by pain but at least 3-/5  deltoid ,biceps, triceps  grip, WF, WE. Was able to do 5-4 left arm lifts before it became difficult to lift arm above shoulder level LLE limited by pain for HF, KE, 4/5 ankle PF and DF Sensory exam normal sensation to light touch and proprioception in bilateral upper and lower extremities. Prior neuro assessment is  c/w 12/04/2023 exam.   Musculoskeletal: Reduced ROM Bilateral shoulder abd and flexion , left shoulder 75% range , right shoulder ~50% range, pain at end range  No joint swelling  Left HF, KE limited by pain --no changes Left knee contracture lack 20 deg from full ext, + bony enlargement of joint  Assessment/Plan: 1. Functional deficits which require 3+ hours per day of interdisciplinary therapy in a comprehensive inpatient rehab setting. Physiatrist is providing close team supervision and 24 hour management of active medical problems listed below. Physiatrist and rehab team continue to assess barriers to discharge/monitor patient progress toward functional and medical goals  Care Tool:  Bathing    Body parts bathed by patient: Right arm, Left arm, Chest, Abdomen, Front perineal area, Right upper leg, Left upper leg, Face   Body parts bathed by helper: Right lower leg, Left lower leg, Buttocks     Bathing assist Assist Level: Minimal Assistance - Patient > 75%     Upper Body Dressing/Undressing Upper body dressing   What is the patient wearing?: Pull over shirt, Bra    Upper body assist Assist Level: Set up assist    Lower Body Dressing/Undressing Lower body dressing      What is the patient wearing?: Pants, Incontinence brief  Lower body assist Assist for lower body dressing: Moderate Assistance - Patient 50 - 74%     Toileting Toileting    Toileting assist Assist for toileting: Minimal Assistance - Patient > 75%     Transfers Chair/bed transfer  Transfers assist     Chair/bed transfer assist level: Minimal Assistance - Patient > 75%     Locomotion Ambulation   Ambulation assist      Assist level: Maximal Assistance - Patient 25 - 49% Assistive device: Walker-rolling Max distance: 3   Walk 10 feet activity   Assist  Walk 10 feet activity did not occur: Safety/medical concerns        Walk 50 feet activity   Assist Walk 50 feet with 2  turns activity did not occur: Safety/medical concerns         Walk 150 feet activity   Assist Walk 150 feet activity did not occur: Safety/medical concerns         Walk 10 feet on uneven surface  activity   Assist Walk 10 feet on uneven surfaces activity did not occur: Safety/medical concerns         Wheelchair     Assist Is the patient using a wheelchair?: Yes Type of Wheelchair: Manual    Wheelchair assist level: Supervision/Verbal cueing Max wheelchair distance: 25    Wheelchair 50 feet with 2 turns activity    Assist        Assist Level: Moderate Assistance - Patient 50 - 74%   Wheelchair 150 feet activity     Assist      Assist Level: Dependent - Patient 0%   Blood pressure 114/60, pulse 70, temperature 98.3 F (36.8 C), temperature source Oral, resp. rate 18, height 5' 4 (1.626 m), weight 73.9 kg, SpO2 98%.  Medical Problem List and Plan: 1. 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  Possible punctate infarct left middle cerebellar peduncle versus artifact.  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             -patient may  shower-cover L hip dressing with occlusive dressing             -ELOS/Goals: 13-15 days- mod I to supervision             -Continue CIR therapies including PT, OT, and SLP   2.  Antithrombotics: -DVT/anticoagulation:  Pharmaceutical: Lovenox .  Check vascular study             -antiplatelet therapy: Aspirin  81 mg daily and Plavix  75 mg daily 3. Pain Management: Neurontin  800 mg 3 times daily, Voltaren  gel 4 g 4 times daily to affected area, oxycodone  5 mg every 4 hours as needed, tramadol  50 mg every 6 hours as needed, Robaxin  500 mg every 8 hours as needed- of note, doesn't like to take meds- encouraged her to do so prior to therapy  -no changes 4.  Mood/Behavior/Sleep: Wellbutrin  150 mg twice daily, trazodone  25 mg nightly as needed             -antipsychotic agents: N/A 5. Neuropsych/cognition: This patient is capable of making decisions on her own behalf. 6. Skin/Wound Care: Routine skin checks 7. Fluids/Electrolytes/Nutrition: Routine in and outs with follow-up chemistries 8.  Left nondisplaced intertrochanteric hip fracture of the proximal left femur.  Status post IM nailing 11/19/2023.  Weightbearing as tolerated 9.  Acute  blood loss anemia.  Follow-up CBC in am 9/8 10.  Hypertension.  Norvasc  10 mg daily, Coreg  25 mg twice daily, lisinopril  40 mg daily.     9/11 good control Vitals:   12/04/23 0305 12/04/23 0800  BP: 125/67 114/60  Pulse: 70 70  Resp: 18   Temp: 98.3 F (36.8 C)   SpO2: 98%     11.  Diabetes mellitus.  Hemoglobin A1c 12.4.  Currently on Lantus  insulin  15 units every 24 hours.  Check blood sugars AC and at bedtime.  Prior to admission patient on Glucophage  500 mg daily, Trulicity weekly CBG (last 3)  Recent Labs    12/03/23 2210 12/04/23 0547 12/04/23 0825  GLUCAP 104* 153* 165*  Increase Lantus  to 20U 9/5- am CBG well controlled Metformin  started 9/5 and then stopped Started novalog 3U TID-- 9/10--obsv for response 12.  AKI on CKD stage IIIB.  Follow-up chemistries    Latest Ref Rng & Units 12/01/2023    5:27 AM 11/26/2023    4:52 AM 11/22/2023    6:22 AM  BMP  Glucose 70 - 99 mg/dL 871  866  878   BUN 8 - 23 mg/dL 30  15  8    Creatinine 0.44 - 1.00 mg/dL 8.61  8.81  9.07   Sodium 135 - 145 mmol/L 136  135  136   Potassium 3.5 - 5.1 mmol/L 4.3  4.3  3.4   Chloride 98 - 111 mmol/L 100  99  102   CO2 22 - 32 mmol/L 27  26  27    Calcium  8.9 - 10.3 mg/dL 9.6  9.0  8.4     13.  Tobacco abuse.  NicoDerm patch.  Provide counseling 14.  Hyperlipidemia.  Lipitor 15.  Constipation.  Adjust bowel program as needed- gave Sorbitol  30cc since lBM 4-5 days ago- if doesn't have good results, suggest Soap Suds  enema.  16. Urinary retention with overflow- ordered bladder scans and to cath q6 hours prn if bladder scan >350cc- also ordered U/A and Cx, and Flomax  0.4 mg q supper -at this time U/A is in process- I ordered in/out caths if volumes >350cc  + Citrobacter and Enterobacter No sensitivities to Keflex  Both sensitive to nitrofurantoin  as well as Bactrim po.  Given some elevation of Creat will rx nitrofurantoin  x 7d thru sunday  -bladder sx much improved 17. Mild spasticity- monitor for symptoms 18. Of note, pt's mother is 32 years old and still doing functionally well! 19. Thrush- ordered nystatin  5ml q6 hours for mild thrush- might need Diflucan - monitor    20.  Left shoulder pain with hx of RCR s/p recent fall -  Xray with mild OA, pain improving with time/PT,OT--   -9/9 resolved   21.  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

## 2023-12-04 NOTE — Progress Notes (Signed)
 Physical Therapy Session Note  Patient Details  Name: [name] MRN: [id number] Date of Birth: 24-Jan-1951  Today's Date: 12/04/2023 PT Individual Time: 1320-1400 PT Individual Time Calculation (min): 40 min   Short Term Goals: Week 1:  PT Short Term Goal 1 (Week 1): Pt will transfer sup to sit w/ min A PT Short Term Goal 2 (Week 1): Pt will transfer sit to stand w/ min A consistently. PT Short Term Goal 3 (Week 1): Pt will amb x 25' w/ RW and min A  Skilled Therapeutic Interventions/Progress Updates: Patient sitting in [facility] following nsg personal care (missed time charted) on entrance to room. Patient alert and agreeable to PT session.   Patient reported unrated ongoing pain on L LE (rest breaks and active movement provided to assist). Pt performed sit<>stands to RW during session with supervision. Pt transported from room<>day room gym in [facility] for time management/energy conservation. Pt required increased encouragement and education this session to breathe through pain, and to reposition self in standing when pain increases (decreased emergent awareness present due to pt requiring cues to problem solve how to avoid L LOB and increase in pain through WB on L LE).   Therapeutic Exercise: Pt performed the following exercises with therapist providing the described cuing and facilitation for improvement. - L LAQ x 10 followed by dorsiflexion/plantarflexion on L with manual resistance to prime L musculature prior to ambulation. - Pt ambulated 18' in RW with overall CGA and some moments of light minA to prevent L LOB due to pt's narrow BOS presentation on occasion. Pt cued to increase. Pt reported need to sit due to feeling like she was falling to L and increasing pain on L LE. PTA cued pt to increase L lateral step to widen BOS with pt reporting decrease in symptoms and ambulated few more feet. Pt required cues to maintain upright posture. Pt required seated rest break. - Pt on kinetron with set  up to emphasize B posterior chain LE musculature. Pt required increased time/effort/multimodal cuing to press into pedal one at a time vs B. Despite resistance set to lowest setting, pt unable to achieve (difficult to determine if that was due to strength or coordination).   Patient sitting in WC at end of session with brakes locked, belt alarm set, and all needs within reach.      Therapy Documentation Precautions:  Precautions Precautions: Fall Recall of Precautions/Restrictions: Impaired Restrictions Weight Bearing Restrictions Per Provider Order: Yes LLE Weight Bearing Per Provider Order: Weight bearing as tolerated Other Position/Activity Restrictions: Patient has a boil on her buttocks that limits ability to sit  Therapy/Group: Individual Therapy  Eithan Beagle PTA 12/04/2023, 3:50 PM

## 2023-12-04 NOTE — Progress Notes (Signed)
 Physical Therapy Weekly Progress Note  Patient Details  Name: [name] MRN: [id number] Date of Birth: [birth year]  Beginning of progress report period: November 26, 2023 End of progress report period: December 04, 2023  Patient has met 2 of 3 short term goals. Pt is making functional progress towards LTG's. Pt currently performs transfers via RW with supervision, which is a vast improvement from requiring closer to maxA at evaluation. Pt is progressing towards goal of ambulating 25' but has been limited due to pain in L LE (VC to breathe through pain and distractions with conversations have assisted - pt has ambulated up to 50' with overall CGA/minA in RW, which is still an improvement from 3' at evaluation). Pt also presents as self limiting due to fear of pain, and requires continued encouragement/recall of pain management (breathing through pain and repositioning). Pt goals will need to be adjusted to mirror anticipated level of functional mobility at d/c.   Patient continues to demonstrate the following deficits muscle weakness, decreased cardiorespiratoy endurance, unbalanced muscle activation, decreased awareness, decreased problem solving, and decreased safety awareness, and decreased standing balance, decreased balance strategies, and hemipareisis and therefore will continue to benefit from skilled PT intervention to increase functional independence with mobility.  Patient progressing toward long term goals..  Plan of care revisions: adjustments made to accommodate for pt's progress, CLOF and expectations for progress.  PT Short Term Goals Week 1:  PT Short Term Goals - Week 1 PT Short Term Goal 1 (Week 1): Pt will transfer sup to sit w/ min A PT Short Term Goal 1 - Progress (Week 1): Met PT Short Term Goal 2 (Week 1): Pt will transfer sit to stand w/ min A consistently. PT Short Term Goal 2 - Progress (Week 1): Met PT Short Term Goal 3 (Week 1): Pt will amb x 25' w/ RW and min  A PT Short Term Goal 3 - Progress (Week 1): Progressing toward goal PT Short Term Goals - Week 2 PT Short Term Goal 1 (Week 2): STG = LTG d/t ELOS  Skilled Therapeutic Interventions/Progress Updates:  Ambulation/gait training;Community reintegration;Neuromuscular re-education;Stair training;UE/LE Strength taining/ROM;Wheelchair propulsion/positioning;Pain management;Discharge planning;Therapeutic Activities;UE/LE Coordination activities;Therapeutic Exercise;Patient/family education;Functional mobility training;Functional electrical stimulation;Disease management/prevention;Balance/vestibular training;DME/adaptive equipment instruction;Psychosocial support   Therapy Documentation Precautions:  Precautions Precautions: Fall Recall of Precautions/Restrictions: Impaired Restrictions Weight Bearing Restrictions Per Provider Order: Yes LLE Weight Bearing Per Provider Order: Weight bearing as tolerated Other Position/Activity Restrictions: Patient has a boil on her buttocks that limits ability to sit  Dominic Sandoval PTA Mliss Milliner PT, DPT, CSRS 12/04/2023, 3:56 PM

## 2023-12-04 NOTE — Progress Notes (Signed)
 Occupational Therapy Session Note  Patient Details  Name: [name] MRN: [id number] Date of Birth: 05-08-1950  Today's Date: 12/04/2023 OT Individual Time: 9164-9064 OT Individual Time Calculation (min): 60 min    Short Term Goals: Week 2:  OT Short Term Goal 1 (Week 2): Pt  will complete toilet transfers with min A OT Short Term Goal 2 (Week 2): Pt will complete toileting with min A OT Short Term Goal 3 (Week 2): Pt will complete bathing with S. OT Short Term Goal 4 (Week 2): Pt will complete tub bench transfer with mod A.  Skilled Therapeutic Interventions/Progress Updates:    Pt received in bed and agreeable to a shower as long as she had a shower cap.  Provided one to pt. Prior to getting out of bed had pt work on warm up exercises of reaching B hands over head.  Pt now able to lift arm to 150 degrees.  She was able to sit up to EOB with Supervision. Light CGA to stand to walker and min A to transfer to w/c then to toilet.  Stepped with RW from toilet to tub bench with then having to turn around with min A and cues to keep walker close to her body.  When L leg hurt too much, cues to scoot R foot forward just a few inches. This allowed her to move more comfortably.  Pt sat on tub bench to shower and then returned to wc to dress. See ADL documentation below.  She is using her L arm more effectively with self care. Overall, good participation with improved standing balance and tolerance.  Pt's PT arrived for her next session.   Therapy Documentation Precautions:  Precautions Precautions: Fall Recall of Precautions/Restrictions: Impaired Restrictions Weight Bearing Restrictions Per Provider Order: Yes LLE Weight Bearing Per Provider Order: Weight bearing as tolerated Other Position/Activity Restrictions: Patient has a boil on her buttocks that limits ability to sit    Vital Signs: Therapy Vitals Pulse Rate: 70 BP: 114/60 Patient Position (if appropriate): Lying Pain:   No c/o pain at rest, 6/10 with weight bearing during transfers.  Modified to have pt rest in between transfers.  ADL: ADL Eating: Set up Grooming: Setup Where Assessed-Grooming: Sitting at sink Upper Body Bathing: Supervision/safety Where Assessed-Upper Body Bathing: Shower Lower Body Bathing: Moderate assistance Where Assessed-Lower Body Bathing: Shower Upper Body Dressing: Supervision/safety Where Assessed-Upper Body Dressing: Wheelchair Lower Body Dressing: Moderate assistance Where Assessed-Lower Body Dressing: Wheelchair Toileting: Moderate assistance Where Assessed-Toileting: Teacher, adult education: Curator Method: Surveyor, minerals: Raised toilet seat, Grab bars Tub/Shower Transfer: Unable to assess Tub/Shower Transfer Method: Unable to assess [employer] Transfer: Unable to assess [employer] Method: Unable to assess    Therapy/Group: Individual Therapy  [name] 12/04/2023, 11:09 AM

## 2023-12-05 LAB — GLUCOSE, CAPILLARY
Glucose-Capillary: 102 mg/dL — ABNORMAL HIGH (ref 70–99)
Glucose-Capillary: 208 mg/dL — ABNORMAL HIGH (ref 70–99)
Glucose-Capillary: 130 mg/dL — ABNORMAL HIGH (ref 70–99)
Glucose-Capillary: 108 mg/dL — ABNORMAL HIGH (ref 70–99)

## 2023-12-05 MED ORDER — BETHANECHOL CHLORIDE 10 MG PO TABS
5.0000 mg | ORAL_TABLET | Freq: Three times a day (TID) | ORAL | Status: AC
Start: 2023-12-05 — End: 2023-12-07
  Administered 2023-12-05 – 2023-12-07 (×6): 5 mg via ORAL
  Filled 2023-12-05 (×6): qty 1

## 2023-12-05 NOTE — Progress Notes (Signed)
 Occupational Therapy Session Note  Patient Details  Name: [name] MRN: [id number] Date of Birth: 1950-09-28  Today's Date: 12/05/2023 OT Individual Time: 1045-1200 OT Individual Time Calculation (min): 75 min    Short Term Goals: Week 1:  OT Short Term Goal 1 (Week 1): Patient will complete toilet transfer with mod assist OT Short Term Goal 1 - Progress (Week 1): Met OT Short Term Goal 2 (Week 1): Patient will don pants with mod assist OT Short Term Goal 2 - Progress (Week 1): Met OT Short Term Goal 3 (Week 1): Patient will complete toileting with mod assist OT Short Term Goal 3 - Progress (Week 1): Met Week 2:  OT Short Term Goal 1 (Week 2): Pt  will complete toilet transfers with min A OT Short Term Goal 2 (Week 2): Pt will complete toileting with min A OT Short Term Goal 3 (Week 2): Pt will complete bathing with S. OT Short Term Goal 4 (Week 2): Pt will complete tub bench transfer with mod A.      Skilled Therapeutic Interventions/Progress Updates:   Pt received in w/c and stated she was ready for therapy. Declined need for bathroom.  Pt continues to have weakness in L shoulder with difficulty lifting arm overhead and having muscular endurance for repeated activity.  Pt taken to main gym to focus on L shoulder AROM using graded tasks (sliding hand up incline, rolling on table with skate board, moving rings on arc.) For therapeutic activity to ensure her [facility] intact, had pt make a beaded bracelet with very small beads and string. Pt moved slowly but was able to string beads without difficulty.  Pt taken back to room to wash hands and helped pt get set up for lunch.  As she was telling her story of how she came to the hospital, pt getting some facts and timeline mixed up.  She continues to have memory impairments, but overall doing well in therapy.     Therapy Documentation Precautions:  Precautions Precautions: Fall Recall of Precautions/Restrictions:  Impaired Restrictions Weight Bearing Restrictions Per Provider Order: Yes LLE Weight Bearing Per Provider Order: Weight bearing as tolerated    Pain: Pain Assessment Pain Scale: 0-10 Pain Score: 0-No pain ADL: ADL Eating: Set up Grooming: Setup Where Assessed-Grooming: Sitting at sink Upper Body Bathing: Supervision/safety Where Assessed-Upper Body Bathing: Shower Lower Body Bathing: Moderate assistance Where Assessed-Lower Body Bathing: Shower Upper Body Dressing: Supervision/safety Where Assessed-Upper Body Dressing: Wheelchair Lower Body Dressing: Moderate assistance Where Assessed-Lower Body Dressing: Wheelchair Toileting: Moderate assistance Where Assessed-Toileting: Teacher, adult education: Curator Method: Surveyor, minerals: Raised toilet seat, Grab bars Tub/Shower Transfer: Unable to assess Tub/Shower Transfer Method: Unable to assess Film/video editor: Minimal assistance Film/video editor Method: Stand pivot [employer]: Emergency planning/management officer, Grab bars    Therapy/Group: Individual Therapy  Alick Lecomte 12/05/2023, 12:19 PM

## 2023-12-05 NOTE — Progress Notes (Signed)
 PROGRESS NOTE   Subjective/Complaints:  Pt up with therapy. Complaining of left knee pain when standing, an old problem.   ROS: Patient denies fever, rash, sore throat, blurred vision, dizziness, nausea, vomiting, diarrhea, cough, shortness of breath or chest pain,  back/neck pain, headache, or mood change.     Objective:   No results found.  No results for input(s): WBC, HGB, HCT, PLT in the last 72 hours.  No results for input(s): NA, K, CL, CO2, GLUCOSE, BUN, CREATININE, CALCIUM  in the last 72 hours.    Intake/Output Summary (Last 24 hours) at 12/05/2023 1115 Last data filed at 12/05/2023 0500 Gross per 24 hour  Intake 360 ml  Output 800 ml  Net -440 ml        Physical Exam: Vital Signs Blood pressure 120/67, pulse 79, temperature 98.3 F (36.8 C), resp. rate 18, height 5' 4 (1.626 m), weight 73.9 kg, SpO2 94%.    Constitutional: No distress . Vital signs reviewed. HEENT: NCAT, EOMI, oral membranes moist Neck: supple Cardiovascular: RRR without murmur. No JVD    Respiratory/Chest: CTA Bilaterally without wheezes or rales. Normal effort    GI/Abdomen: BS +, non-tender, non-distended Ext: no clubbing, cyanosis, or edema Psych: pleasant and cooperative  Skin: No evidence of breakdown, no evidence of rash Neurologic: Cranial nerves II through XII intact, motor strength is 5/5 in right deltoid, bicep, tricep, grip, hip flexor, knee extensors, ankle dorsiflexor and plantar flexor Left delt and biceps limited by pain but at least 3-/5  deltoid ,biceps, triceps  grip, WF, WE. Was able to do 5-4 left arm lifts before it became difficult to lift arm above shoulder level LLE limited by pain for HF, KE, 4/5 ankle PF and DF Sensory exam normal sensation to light touch and proprioception in bilateral upper and lower extremities. Prior neuro assessment is c/w 12/05/2023 exam.   Musculoskeletal:  Reduced ROM Bilateral shoulder abd and flexion , left shoulder 75% range , right shoulder ~50% range, pain at end range  No joint swelling  Left HF, KE limited by pain --no changes Left knee contracture lack 20 deg from full ext, + bony enlargement of joint, pain with palpation along jt lines and wb.   Assessment/Plan: 1. Functional deficits which require 3+ hours per day of interdisciplinary therapy in a comprehensive inpatient rehab setting. Physiatrist is providing close team supervision and 24 hour management of active medical problems listed below. Physiatrist and rehab team continue to assess barriers to discharge/monitor patient progress toward functional and medical goals  Care Tool:  Bathing    Body parts bathed by patient: Right arm, Left arm, Chest, Abdomen, Front perineal area, Right upper leg, Left upper leg, Face   Body parts bathed by helper: Right lower leg, Left lower leg, Buttocks     Bathing assist Assist Level: Minimal Assistance - Patient > 75%     Upper Body Dressing/Undressing Upper body dressing   What is the patient wearing?: Pull over shirt, Bra    Upper body assist Assist Level: Minimal Assistance - Patient > 75% (wearing a thick sweatshirt)    Lower Body Dressing/Undressing Lower body dressing      What is  the patient wearing?: Pants, Incontinence brief     Lower body assist Assist for lower body dressing: Moderate Assistance - Patient 50 - 74%     Toileting Toileting    Toileting assist Assist for toileting: Minimal Assistance - Patient > 75%     Transfers Chair/bed transfer  Transfers assist     Chair/bed transfer assist level: Supervision/Verbal cueing     Locomotion Ambulation   Ambulation assist      Assist level: Minimal Assistance - Patient > 75% Assistive device: Walker-rolling Max distance: 18   Walk 10 feet activity   Assist  Walk 10 feet activity did not occur: Safety/medical concerns  Assist level: Contact  Guard/Touching assist Assistive device: Walker-rolling   Walk 50 feet activity   Assist Walk 50 feet with 2 turns activity did not occur: Safety/medical concerns         Walk 150 feet activity   Assist Walk 150 feet activity did not occur: Safety/medical concerns         Walk 10 feet on uneven surface  activity   Assist Walk 10 feet on uneven surfaces activity did not occur: Safety/medical concerns         Wheelchair     Assist Is the patient using a wheelchair?: Yes Type of Wheelchair: Manual    Wheelchair assist level: Supervision/Verbal cueing Max wheelchair distance: 25    Wheelchair 50 feet with 2 turns activity    Assist        Assist Level: Moderate Assistance - Patient 50 - 74%   Wheelchair 150 feet activity     Assist      Assist Level: Dependent - Patient 0%   Blood pressure 120/67, pulse 79, temperature 98.3 F (36.8 C), resp. rate 18, height 5' 4 (1.626 m), weight 73.9 kg, SpO2 94%.  Medical Problem List and Plan: 1. 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  Possible punctate infarct left middle cerebellar peduncle versus artifact.  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             -patient may  shower-cover L hip dressing with occlusive dressing             -ELOS/Goals: 13-15 days- mod I to supervision           -Continue CIR therapies including PT, OT, and SLP     2.  Antithrombotics: -DVT/anticoagulation:  Pharmaceutical: Lovenox .  Check vascular study             -antiplatelet therapy: Aspirin  81 mg daily and Plavix  75 mg daily 3. Pain Management: Neurontin  800 mg 3 times daily, Voltaren  gel 4 g 4 times daily to affected area, oxycodone  5 mg every 4 hours as needed, tramadol  50 mg every 6 hours as needed, Robaxin  500 mg every 8 hours as needed- of note, doesn't like  to take meds- encouraged her to do so prior to therapy  -9/12 increased left knee pain   -will add voltaren  gel placement to left knee   -?knee sleeve? 4. Mood/Behavior/Sleep: Wellbutrin  150 mg twice daily, trazodone  25 mg nightly as needed             -antipsychotic agents: N/A 5. Neuropsych/cognition: This patient is capable of making decisions on her own behalf. 6. Skin/Wound Care: Routine skin checks 7. Fluids/Electrolytes/Nutrition: Routine in and outs with follow-up chemistries 8.  Left nondisplaced intertrochanteric hip fracture of the proximal left femur.  Status post IM nailing 11/19/2023.  Weightbearing as tolerated 9.  Acute blood loss anemia.  Follow-up CBC in am 9/8 10.  Hypertension.  Norvasc  10 mg daily, Coreg  25 mg twice daily, lisinopril  40 mg daily.     9/12 good control Vitals:   12/04/23 2025 12/05/23 0519  BP: 112/67 120/67  Pulse: 68 79  Resp: 18 18  Temp: 98.4 F (36.9 C) 98.3 F (36.8 C)  SpO2: 100% 94%    11.  Diabetes mellitus.  Hemoglobin A1c 12.4.  Currently on Lantus  insulin  15 units every 24 hours.  Check blood sugars AC and at bedtime.  Prior to admission patient on Glucophage  500 mg daily, Trulicity weekly CBG (last 3)  Recent Labs    12/04/23 1701 12/04/23 2045 12/05/23 0524  GLUCAP 144* 191* 102*  Increase Lantus  to 20U 9/5- am CBG well controlled Metformin  started 9/5 and then stopped 9/12 Started novolog  3U TID-- 9/10--improving control  -increase to 4u tid 12.  AKI on CKD stage IIIB.  Follow-up chemistries    Latest Ref Rng & Units 12/01/2023    5:27 AM 11/26/2023    4:52 AM 11/22/2023    6:22 AM  BMP  Glucose 70 - 99 mg/dL 871  866  878   BUN 8 - 23 mg/dL 30  15  8    Creatinine 0.44 - 1.00 mg/dL 8.61  8.81  9.07   Sodium 135 - 145 mmol/L 136  135  136   Potassium 3.5 - 5.1 mmol/L 4.3  4.3  3.4   Chloride 98 - 111 mmol/L 100  99  102   CO2 22 - 32 mmol/L 27  26  27    Calcium  8.9 - 10.3 mg/dL 9.6  9.0  8.4     13.  Tobacco abuse.   NicoDerm patch.  Provide counseling 14.  Hyperlipidemia.  Lipitor 15.  Constipation.  Adjust bowel program as needed- gave Sorbitol  30cc since lBM 4-5 days ago- if doesn't have good results, suggest Soap Suds enema.  16. Urinary retention with overflow- ordered bladder scans and to cath q6 hours prn if bladder scan >350cc- also ordered U/A and Cx, and Flomax  0.4 mg q supper -at this time U/A is in process- I ordered in/out caths if volumes >350cc  + Citrobacter and Enterobacter No sensitivities to Keflex  Both sensitive to nitrofurantoin  as well as Bactrim po.  Given some elevation of Creat will rx nitrofurantoin  x 7d thru sunday  9/12-bladder sx have resolved   -wean urecholine  to off 17. Mild spasticity- monitor for symptoms 18. Of note, pt's mother is 61 years old and still doing functionally well! 19. Thrush- ordered nystatin  5ml q6 hours for mild thrush- might need Diflucan - monitor    20.  Left shoulder pain with hx of RCR s/p recent fall -  Xray with mild OA, pain improving with time/PT,OT--   -9/9 resolved   21.  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 LOS: 10 days A FACE TO FACE EVALUATION WAS PERFORMED  Arthea ONEIDA Gunther 12/05/2023, 11:15 AM

## 2023-12-05 NOTE — Plan of Care (Signed)
  Problem: Consults Goal: RH STROKE PATIENT EDUCATION Description: See Patient Education module for education specifics  Outcome: Progressing   Problem: RH BOWEL ELIMINATION Goal: RH STG MANAGE BOWEL WITH ASSISTANCE Description: STG Manage Bowel with mod I  Assistance. Outcome: Progressing Goal: RH STG MANAGE BOWEL W/MEDICATION W/ASSISTANCE Description: STG Manage Bowel with Medication with mod I Assistance. Outcome: Progressing   Problem: RH SAFETY Goal: RH STG ADHERE TO SAFETY PRECAUTIONS W/ASSISTANCE/DEVICE Description: STG Adhere to Safety Precautions With cues Assistance/Device. Outcome: Progressing   Problem: RH PAIN MANAGEMENT Goal: RH STG PAIN MANAGED AT OR BELOW PT'S PAIN GOAL Description: Pain < 4 with prns Outcome: Progressing   Problem: RH KNOWLEDGE DEFICIT Goal: RH STG INCREASE KNOWLEDGE OF DIABETES Description: Patient and family will be able to manage DM using educational resources for medications and dietary modification independently Outcome: Progressing Goal: RH STG INCREASE KNOWLEDGE OF HYPERTENSION Description: Patient and family will be able to manage HTN using educational resources for medications and dietary modification independently Outcome: Progressing Goal: RH STG INCREASE KNOWLEGDE OF HYPERLIPIDEMIA Description: Patient and family will be able to manage HLD using educational resources for medications and dietary modification independently Outcome: Progressing Goal: RH STG INCREASE KNOWLEDGE OF STROKE PROPHYLAXIS Description: Patient and family will be able to manage secondary risks using educational resources for medications and dietary modification independently Outcome: Progressing

## 2023-12-05 NOTE — Progress Notes (Signed)
 Speech Language Pathology Daily Session Note  Patient Details  Name: [name] MRN: [id number] Date of Birth: 04/24/1950  Today's Date: 12/05/2023 SLP Individual Time: 1445-1530 SLP Individual Time Calculation (min): 45 min  Short Term Goals: Week 2: SLP Short Term Goal 1 (Week 2): STG = LTG due to ELOS  Skilled Therapeutic Interventions:   Pt greeted in her room. She was up in her [facility] and very pleasant/cooperative throughout tx tasks targeting cognition. She was able to recall ST tx session from prev day w/ minA. She was also able to independently utilize her to memory book to assist w/ recall of today's tx schedule. She did benefit from minA cues to recall times of events and to recall lunch. Of note, these details were not originally written in her memory book. She then completed a time management task utilizing an hourly schedule. She required modA cues for organization, sustained attention, attention to details, and problem solving throughout. She remains tangential overall, however, anticipate this d/t pt personality > attention deficits. At the end of tx tasks, she was left in her chair w/ the alarm set and call light within reach.    Pain  None endorsed  Therapy/Group: Individual Therapy  [name] 12/05/2023, 4:56 PM

## 2023-12-05 NOTE — Progress Notes (Signed)
 Physical Therapy Session Note  Patient Details  Name: [name] MRN: [id number] Date of Birth: [birth year]  Today's Date: 12/05/2023 PT Individual Time: 9195-9181 PT Individual Time Calculation (min): 14 min  And Today's Date: 12/05/2023 PT Individual Time: 9159-9079 PT Individual Time Calculation (min): 40 min And Today's Date: 12/05/2023 PT Missed Time: 21 Minutes Missed Time Reason: Patient unwilling to participate;Other (Comment) (breakfast arrival)   Short Term Goals: Week 1:  PT Short Term Goal 1 (Week 1): Pt will transfer sup to sit w/ min A PT Short Term Goal 1 - Progress (Week 1): Met PT Short Term Goal 2 (Week 1): Pt will transfer sit to stand w/ min A consistently. PT Short Term Goal 2 - Progress (Week 1): Met PT Short Term Goal 3 (Week 1): Pt will amb x 25' w/ RW and min A PT Short Term Goal 3 - Progress (Week 1): Progressing toward goal  Skilled Therapeutic Interventions/Progress Updates:  Patient supine in bed on entrance to room. Patient alert and agreeable to PT session.   Patient with no pain complaint at start of session. But does relate that she has pain in her LLE with mobility.  Breakfast tray arrives during start of session while trying to build therapeutic alliance. Pt relates need for at least 15 min to eat breakfast while it is hot and allowed time to eat. On return to room, pt ready to participate.   Therapeutic Activity: Bed Mobility: Pt performed supine > sit with MinA/ CGA. VC/ tc required for improving technique with roll and push up from sidelying.  Transfers: Pt performed sit<>stand and stand pivot transfers throughout session with SBA/ light CGA using RW. Provided vc/ tc for scoot to EOB and push from bed surface in sit<>stand. Pt with decreased control in descent to sit and cued to maintain slow descent to sit.   Wheelchair Mobility:  Pt propelled wheelchair over 215 feet with BUE. Pace was slow but consistent with one brief rest break around  144ft. Provided vc/ tc for hand positioning on handrim of wheel. Pt relates w/c at home but is too big for her.   Patient seated upright in w/c at end of session with brakes locked, belt alarm set, and all needs within reach.   Therapy Documentation Precautions:  Precautions Precautions: Fall Recall of Precautions/Restrictions: Impaired Restrictions Weight Bearing Restrictions Per Provider Order: Yes LLE Weight Bearing Per Provider Order: Weight bearing as tolerated Other Position/Activity Restrictions: no restrictions General: PT Amount of Missed Time (min): 21 Minutes PT Missed Treatment Reason: Patient unwilling to participate;Other (Comment) (breakfast arrival)  Pain: Minimal pain at rest but increases with WB. Is tolerable to pt.   Therapy/Group: Individual Therapy  Mliss DELENA Milliner PT, DPT, CSRS 12/05/2023, 6:26 PM

## 2023-12-06 DIAGNOSIS — I6349 Cerebral infarction due to embolism of other cerebral artery: Secondary | ICD-10-CM

## 2023-12-06 LAB — GLUCOSE, CAPILLARY
Glucose-Capillary: 142 mg/dL — ABNORMAL HIGH (ref 70–99)
Glucose-Capillary: 239 mg/dL — ABNORMAL HIGH (ref 70–99)
Glucose-Capillary: 153 mg/dL — ABNORMAL HIGH (ref 70–99)
Glucose-Capillary: 142 mg/dL — ABNORMAL HIGH (ref 70–99)

## 2023-12-06 NOTE — Progress Notes (Signed)
 Occupational Therapy Session Note  Patient Details  Name: [name] MRN: [id number] Date of Birth: 02-17-1951  Today's Date: 12/06/2023 OT Individual Time: 1005-1050 OT Individual Time Calculation (min): 45 min    Short Term Goals: Week 1:  OT Short Term Goal 1 (Week 1): Patient will complete toilet transfer with mod assist OT Short Term Goal 1 - Progress (Week 1): Met OT Short Term Goal 2 (Week 1): Patient will don pants with mod assist OT Short Term Goal 2 - Progress (Week 1): Met OT Short Term Goal 3 (Week 1): Patient will complete toileting with mod assist OT Short Term Goal 3 - Progress (Week 1): Met Week 2:  OT Short Term Goal 1 (Week 2): Pt  will complete toilet transfers with min A OT Short Term Goal 2 (Week 2): Pt will complete toileting with min A OT Short Term Goal 3 (Week 2): Pt will complete bathing with S. OT Short Term Goal 4 (Week 2): Pt will complete tub bench transfer with mod A.  Skilled Therapeutic Interventions/Progress Updates:    Pt received in wc finishing up her grooming routine. Pt declined need for bathing.  Pt agreeable to going to tub room to practice tub transfers. Pt began to self propel her wc 20 ft but had difficulty moving it forward in a smooth manner.   In tub room, pt stood to RW with close S and stepped to bench. She sat down and was able to bring B legs into tub with supervision!  She also was able to get B legs out without A.  Only CGA to rise to stand from tub bench seat.  Pt then transferred back to wc.   For UE exercise, pt used arm bike at low setting of 1 and was only able to move pedals very slowly at 8 rpm.    Pt returned to room with all needs met. Alarm belt on.  Therapy Documentation Precautions:  Precautions Precautions: Fall Recall of Precautions/Restrictions: Impaired Restrictions Weight Bearing Restrictions Per Provider Order: Yes LLE Weight Bearing Per Provider Order: Weight bearing as tolerated Other  Position/Activity Restrictions: no restrictions     Pain: Pain Assessment Pain Scale: 0-10 Pain Score: 8  Pain Location: Leg Pain Intervention(s): Medication (See eMAR) ADL: ADL Eating: Set up Grooming: Setup Where Assessed-Grooming: Sitting at sink Upper Body Bathing: Supervision/safety Where Assessed-Upper Body Bathing: Shower Lower Body Bathing: Moderate assistance Where Assessed-Lower Body Bathing: Shower Upper Body Dressing: Supervision/safety Where Assessed-Upper Body Dressing: Wheelchair Lower Body Dressing: Moderate assistance Where Assessed-Lower Body Dressing: Wheelchair Toileting: Moderate assistance Where Assessed-Toileting: Teacher, adult education: Curator Method: Surveyor, minerals: Raised toilet seat, Grab bars Tub/Shower Transfer: Unable to assess Tub/Shower Transfer Method: Unable to assess Film/video editor: Minimal assistance Film/video editor Method: Stand pivot [employer]: Emergency planning/management officer, Grab bars   Therapy/Group: Individual Therapy  Gerhardt Gleed 12/06/2023, 12:36 PM

## 2023-12-06 NOTE — Plan of Care (Signed)
  Problem: Consults Goal: RH STROKE PATIENT EDUCATION Description: See Patient Education module for education specifics  Outcome: Progressing   Problem: RH BOWEL ELIMINATION Goal: RH STG MANAGE BOWEL WITH ASSISTANCE Description: STG Manage Bowel with mod I  Assistance. Outcome: Progressing Goal: RH STG MANAGE BOWEL W/MEDICATION W/ASSISTANCE Description: STG Manage Bowel with Medication with mod I Assistance. Outcome: Progressing   Problem: RH SAFETY Goal: RH STG ADHERE TO SAFETY PRECAUTIONS W/ASSISTANCE/DEVICE Description: STG Adhere to Safety Precautions With cues Assistance/Device. Outcome: Progressing   Problem: RH PAIN MANAGEMENT Goal: RH STG PAIN MANAGED AT OR BELOW PT'S PAIN GOAL Description: Pain < 4 with prns Outcome: Progressing   Problem: RH KNOWLEDGE DEFICIT Goal: RH STG INCREASE KNOWLEDGE OF DIABETES Description: Patient and family will be able to manage DM using educational resources for medications and dietary modification independently Outcome: Progressing Goal: RH STG INCREASE KNOWLEDGE OF HYPERTENSION Description: Patient and family will be able to manage HTN using educational resources for medications and dietary modification independently Outcome: Progressing Goal: RH STG INCREASE KNOWLEGDE OF HYPERLIPIDEMIA Description: Patient and family will be able to manage HLD using educational resources for medications and dietary modification independently Outcome: Progressing Goal: RH STG INCREASE KNOWLEDGE OF STROKE PROPHYLAXIS Description: Patient and family will be able to manage secondary risks using educational resources for medications and dietary modification independently Outcome: Progressing

## 2023-12-06 NOTE — Progress Notes (Signed)
 Physical Therapy Session Note  Patient Details  Name: [name] MRN: [id number] Date of Birth: 1950/04/06  Today's Date: 12/06/2023 PT Individual Time: 1120-1200 PT Individual Time Calculation (min): 40 min   Short Term Goals: Week 1:  PT Short Term Goal 1 (Week 1): Pt will transfer sup to sit w/ min A PT Short Term Goal 1 - Progress (Week 1): Met PT Short Term Goal 2 (Week 1): Pt will transfer sit to stand w/ min A consistently. PT Short Term Goal 2 - Progress (Week 1): Met PT Short Term Goal 3 (Week 1): Pt will amb x 25' w/ RW and min A PT Short Term Goal 3 - Progress (Week 1): Progressing toward goal  Skilled Therapeutic Interventions/Progress Updates: Patient sitting in WC on entrance to room. Patient alert and agreeable to early change in PT session.   Patient reported unrated pain in L LE and that it prevented ability to receive adequate sleep. Rest breaks provided as needed.  Therapeutic Activity: - Pt propelled WC from room<W elevators (roughly 130') and CGA due to pt drifting to R and required VC to increase awareness to avoid bumping into wall/objects. - Pt ascended ramp in [facility] with minA to get over lip of ramp and CGA/light minA to ascend safely up as pt would propel a few inches, then WC would roll backwards a few inches. Pt then descended with minA to control speed on first round. PTA brought pt back up to top of ramp for pt to work on controlling speed down with VC to grip handles on drive wheels a little tighter with pt performing x2 and progressed to close supervision and improved speed - Pt performed car transfer with close supervision/CGA to advance L LE into car due car set up (height increased to simulate pt's car at d/c). Pt able to pivot out of car with supervision. Pt cued to use handle at top of car as pt reported that the car she will transfer in to has one. Pt with supervision to stand/sit in RW from WC/car height. Pt transported back to room in [facility] dependently  due to fatigue.   Patient sitting in WC at end of session with brakes locked, belt alarm set, and all needs within reach.      Therapy Documentation Precautions:  Precautions Precautions: Fall Recall of Precautions/Restrictions: Impaired Restrictions Weight Bearing Restrictions Per Provider Order: Yes LLE Weight Bearing Per Provider Order: Weight bearing as tolerated Other Position/Activity Restrictions: no restrictions   Therapy/Group: Individual Therapy  Florean Hoobler PTA 12/06/2023, 12:09 PM

## 2023-12-06 NOTE — Progress Notes (Signed)
 PROGRESS NOTE   Subjective/Complaints:  No events overnight.  Continues to complain of pain in her left knee, aching, which started 3 days ago. Vitals stable     12/06/2023    4:33 AM 12/05/2023    7:44 PM 12/05/2023    2:49 PM  Vitals with BMI  Systolic 131 126 875  Diastolic 63 60 70  Pulse 72 72 64    Recent Labs    12/05/23 1713 12/05/23 2206 12/06/23 0612  GLUCAP 130* 108* 142*     P.o. intakes appropriate  Continent of bladder  Last BM 9/11   ROS: Patient denies fever, rash, sore throat, blurred vision, dizziness, nausea, vomiting, diarrhea, cough, shortness of breath or chest pain,  back/neck pain, headache, or mood change.  + Left knee pain   Objective:   No results found.  No results for input(s): WBC, HGB, HCT, PLT in the last 72 hours.  No results for input(s): NA, K, CL, CO2, GLUCOSE, BUN, CREATININE, CALCIUM  in the last 72 hours.    Intake/Output Summary (Last 24 hours) at 12/06/2023 1032 Last data filed at 12/06/2023 0444 Gross per 24 hour  Intake 472 ml  Output 200 ml  Net 272 ml        Physical Exam: Vital Signs Blood pressure 131/63, pulse 72, temperature 97.7 F (36.5 C), temperature source Oral, resp. rate 18, height 5' 4 (1.626 m), weight 73.9 kg, SpO2 93%.    Constitutional: No distress . Vital signs reviewed.  Laying in bed. HEENT: NCAT, EOMI, oral membranes moist Neck: supple Cardiovascular: RRR without murmur. No JVD    Respiratory/Chest: CTA Bilaterally without wheezes or rales. Normal effort    GI/Abdomen: BS +, non-tender, non-distended Ext: no clubbing, cyanosis, or edema Psych: pleasant and cooperative  Skin: No evidence of breakdown, no evidence of rash Neurologic: Cranial nerves II through XII intact,  Moving all 4 limbs antigravity against resistance in bed 9-13: See prior exams below  motor strength is 5/5 in right deltoid, bicep,  tricep, grip, hip flexor, knee extensors, ankle dorsiflexor and plantar flexor Left delt and biceps limited by pain but at least 3-/5  deltoid ,biceps, triceps  grip, WF, WE. Was able to do 5-4 left arm lifts before it became difficult to lift arm above shoulder level LLE limited by pain for HF, KE, 4/5 ankle PF and DF Sensory exam normal sensation to light touch and proprioception in bilateral upper and lower extremities.   Musculoskeletal: Reduced ROM Bilateral shoulder abd and flexion , left shoulder 75% range , right shoulder ~50% range, pain at end range  No joint swelling  Left HF, KE limited by pain --no changes Left knee contracture lack 20 deg from full ext, + bony enlargement of joint, pain with palpation at inferior patella but not along lateral or medial joint line, posterior joint, or superior joint.    Assessment/Plan: 1. Functional deficits which require 3+ hours per day of interdisciplinary therapy in a comprehensive inpatient rehab setting. Physiatrist is providing close team supervision and 24 hour management of active medical problems listed below. Physiatrist and rehab team continue to assess barriers to discharge/monitor patient progress toward functional and medical  goals  Care Tool:  Bathing    Body parts bathed by patient: Right arm, Left arm, Chest, Abdomen, Front perineal area, Right upper leg, Left upper leg, Face   Body parts bathed by helper: Right lower leg, Left lower leg, Buttocks     Bathing assist Assist Level: Minimal Assistance - Patient > 75%     Upper Body Dressing/Undressing Upper body dressing   What is the patient wearing?: Pull over shirt, Bra    Upper body assist Assist Level: Minimal Assistance - Patient > 75% (wearing a thick sweatshirt)    Lower Body Dressing/Undressing Lower body dressing      What is the patient wearing?: Pants, Incontinence brief     Lower body assist Assist for lower body dressing: Moderate Assistance -  Patient 50 - 74%     Toileting Toileting    Toileting assist Assist for toileting: Minimal Assistance - Patient > 75%     Transfers Chair/bed transfer  Transfers assist     Chair/bed transfer assist level: Supervision/Verbal cueing     Locomotion Ambulation   Ambulation assist      Assist level: Minimal Assistance - Patient > 75% Assistive device: Walker-rolling Max distance: 18   Walk 10 feet activity   Assist  Walk 10 feet activity did not occur: Safety/medical concerns  Assist level: Contact Guard/Touching assist Assistive device: Walker-rolling   Walk 50 feet activity   Assist Walk 50 feet with 2 turns activity did not occur: Safety/medical concerns         Walk 150 feet activity   Assist Walk 150 feet activity did not occur: Safety/medical concerns         Walk 10 feet on uneven surface  activity   Assist Walk 10 feet on uneven surfaces activity did not occur: Safety/medical concerns         Wheelchair     Assist Is the patient using a wheelchair?: Yes Type of Wheelchair: Manual    Wheelchair assist level: Supervision/Verbal cueing Max wheelchair distance: 25    Wheelchair 50 feet with 2 turns activity    Assist        Assist Level: Moderate Assistance - Patient 50 - 74%   Wheelchair 150 feet activity     Assist      Assist Level: Dependent - Patient 0%   Blood pressure 131/63, pulse 72, temperature 97.7 F (36.5 C), temperature source Oral, resp. rate 18, height 5' 4 (1.626 m), weight 73.9 kg, SpO2 93%.  Medical Problem List and Plan: 1. 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  Possible punctate infarct left middle cerebellar peduncle versus artifact.  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             -patient may  shower-cover L hip dressing with  occlusive dressing             -ELOS/Goals: 13-15 days- mod I to supervision           -Continue CIR therapies including PT, OT, and SLP     2.  Antithrombotics: -DVT/anticoagulation:  Pharmaceutical: Lovenox .  Check vascular study             -antiplatelet therapy: Aspirin  81 mg daily and Plavix  75 mg daily 3. Pain Management: Neurontin  800 mg 3 times daily, Voltaren  gel 4 g 4 times daily to affected area, oxycodone   5 mg every 4 hours as needed, tramadol  50 mg every 6 hours as needed, Robaxin  500 mg every 8 hours as needed- of note, doesn't like to take meds- encouraged her to do so prior to therapy  -9/12 increased left knee pain   -will add voltaren  gel placement to left knee   -?knee sleeve?   9/13: ordered ACE wrap for knee for compression, Voltaren  gel 4 times daily  4. Mood/Behavior/Sleep: Wellbutrin  150 mg twice daily, trazodone  25 mg nightly as needed             -antipsychotic agents: N/A 5. Neuropsych/cognition: This patient is capable of making decisions on her own behalf. 6. Skin/Wound Care: Routine skin checks 7. Fluids/Electrolytes/Nutrition: Routine in and outs with follow-up chemistries 8.  Left nondisplaced intertrochanteric hip fracture of the proximal left femur.  Status post IM nailing 11/19/2023.  Weightbearing as tolerated 9.  Acute blood loss anemia.  Follow-up CBC in am 9/8 10.  Hypertension.  Norvasc  10 mg daily, Coreg  25 mg twice daily, lisinopril  40 mg daily.     9/12 good control Vitals:   12/05/23 1944 12/06/23 0433  BP: 126/60 131/63  Pulse: 72 72  Resp: 18 18  Temp: 98.2 F (36.8 C) 97.7 F (36.5 C)  SpO2: 97% 93%    11.  Diabetes mellitus.  Hemoglobin A1c 12.4.  Currently on Lantus  insulin  15 units every 24 hours.  Check blood sugars AC and at bedtime.  Prior to admission patient on Glucophage  500 mg daily, Trulicity weekly CBG (last 3)  Recent Labs    12/05/23 1713 12/05/23 2206 12/06/23 0612  GLUCAP 130* 108* 142*  Increase Lantus  to 20U 9/5-  am CBG well controlled Metformin  started 9/5 and then stopped 9/12 Started novolog  3U TID-- 9/10--improving control  -increase to 4u tid 9-13: Blood sugar well-controlled.  Continue current regimen.  12.  AKI on CKD stage IIIB.  Follow-up chemistries    Latest Ref Rng & Units 12/01/2023    5:27 AM 11/26/2023    4:52 AM 11/22/2023    6:22 AM  BMP  Glucose 70 - 99 mg/dL 871  866  878   BUN 8 - 23 mg/dL 30  15  8    Creatinine 0.44 - 1.00 mg/dL 8.61  8.81  9.07   Sodium 135 - 145 mmol/L 136  135  136   Potassium 3.5 - 5.1 mmol/L 4.3  4.3  3.4   Chloride 98 - 111 mmol/L 100  99  102   CO2 22 - 32 mmol/L 27  26  27    Calcium  8.9 - 10.3 mg/dL 9.6  9.0  8.4     13.  Tobacco abuse.  NicoDerm patch.  Provide counseling 14.  Hyperlipidemia.  Lipitor 15.  Constipation.  Adjust bowel program as needed- gave Sorbitol  30cc since lBM 4-5 days ago- if doesn't have good results, suggest Soap Suds enema.  16. Urinary retention with overflow- ordered bladder scans and to cath q6 hours prn if bladder scan >350cc- also ordered U/A and Cx, and Flomax  0.4 mg q supper -at this time U/A is in process- I ordered in/out caths if volumes >350cc  + Citrobacter and Enterobacter No sensitivities to Keflex  Both sensitive to nitrofurantoin  as well as Bactrim po.  Given some elevation of Creat will rx nitrofurantoin  x 7d thru sunday  9/12-bladder sx have resolved   -wean urecholine  to off  9-13: Bladder scans 250-108.  Continue to monitor.  All continent  17. Mild spasticity-  monitor for symptoms 18. Of note, pt's mother is 57 years old and still doing functionally well! 19. Thrush- ordered nystatin  5ml q6 hours for mild thrush- might need Diflucan - monitor    20.  Left shoulder pain with hx of RCR s/p recent fall -  Xray with mild OA, pain improving with time/PT,OT--   -9/9 resolved   21.  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

## 2023-12-07 ENCOUNTER — Inpatient Hospital Stay (HOSPITAL_COMMUNITY)

## 2023-12-07 LAB — GLUCOSE, CAPILLARY
Glucose-Capillary: 137 mg/dL — ABNORMAL HIGH (ref 70–99)
Glucose-Capillary: 185 mg/dL — ABNORMAL HIGH (ref 70–99)
Glucose-Capillary: 219 mg/dL — ABNORMAL HIGH (ref 70–99)
Glucose-Capillary: 223 mg/dL — ABNORMAL HIGH (ref 70–99)

## 2023-12-07 MED ORDER — POLYETHYLENE GLYCOL 3350 17 G PO PACK
17.0000 g | PACK | Freq: Every day | ORAL | Status: DC
  Administered 2023-12-07 – 2023-12-10 (×4): 17 g via ORAL
  Filled 2023-12-07 (×4): qty 1

## 2023-12-07 MED ORDER — SENNOSIDES-DOCUSATE SODIUM 8.6-50 MG PO TABS
1.0000 | ORAL_TABLET | Freq: Two times a day (BID) | ORAL | Status: DC
  Administered 2023-12-07 – 2023-12-10 (×7): 1 via ORAL
  Filled 2023-12-07 (×7): qty 1

## 2023-12-07 NOTE — Progress Notes (Signed)
 PROGRESS NOTE   Subjective/Complaints:  No events overnight.   Continues to have 7-9 out of 10 pain in her left leg consistently around the knee.  On exam, she states that it is actually much better after she got to shots that Dr. Arlana ordered this morning; explained to patient there were no shots for pain ordered and likely just related to mobility this morning.  Vitals stable     12/07/2023    4:23 AM 12/06/2023    7:49 PM 12/06/2023    2:49 PM  Vitals with BMI  Systolic 131 130 868  Diastolic 69 93 75  Pulse 72 73 60    Recent Labs    12/06/23 1700 12/06/23 2033 12/07/23 0602  GLUCAP 153* 142* 137*    Bladder scans remain less than 300 Last bowel movement 9-11  ROS: Patient denies fever, rash, sore throat, blurred vision, dizziness, nausea, vomiting, diarrhea, cough, shortness of breath or chest pain,  back/neck pain, headache, or mood change.  + Left knee pain   Objective:   No results found.  No results for input(s): WBC, HGB, HCT, PLT in the last 72 hours.  No results for input(s): NA, K, CL, CO2, GLUCOSE, BUN, CREATININE, CALCIUM  in the last 72 hours.    Intake/Output Summary (Last 24 hours) at 12/07/2023 0942 Last data filed at 12/07/2023 0758 Gross per 24 hour  Intake 720 ml  Output 400 ml  Net 320 ml        Physical Exam: Vital Signs Blood pressure 131/69, pulse 72, temperature 98.3 F (36.8 C), temperature source Oral, resp. rate 18, height 5' 4 (1.626 m), weight 73.9 kg, SpO2 90%.    Constitutional: No distress . Vital signs reviewed.  Sitting up in bedside chair. HEENT: NCAT, EOMI, oral membranes moist Neck: supple Cardiovascular: RRR without murmur. No JVD    Respiratory/Chest: CTA Bilaterally without wheezes or rales. Normal effort    GI/Abdomen: BS +, non-tender, non-distended Ext: no clubbing, cyanosis, or edema Psych: pleasant and cooperative  Skin:  No evidence of breakdown, no evidence of rash Neurologic: Cranial nerves II through XII intact,  Moving all 4 limbs antigravity against resistance in bed 9-13: See prior exams below  motor strength is 5/5 in right deltoid, bicep, tricep, grip, hip flexor, knee extensors, ankle dorsiflexor and plantar flexor Left delt and biceps limited by pain but at least 3-/5  deltoid ,biceps, triceps  grip, WF, WE. Was able to do 5-4 left arm lifts before it became difficult to lift arm above shoulder level LLE limited by pain for HF, KE, 4/5 ankle PF and DF Sensory exam normal sensation to light touch and proprioception in bilateral upper and lower extremities.   Musculoskeletal: Reduced ROM Bilateral shoulder abd and flexion , left shoulder 75% range , right shoulder ~50% range, pain at end range  No joint swelling  Left HF, KE limited by pain --no changes Left knee contracture lack 20 deg from full ext, + bony enlargement of joint, pain with palpation at inferior patella but not along lateral or medial joint line, posterior joint, or superior joint.  Unchanged exam 9-14   Assessment/Plan: 1. Functional deficits which require  3+ hours per day of interdisciplinary therapy in a comprehensive inpatient rehab setting. Physiatrist is providing close team supervision and 24 hour management of active medical problems listed below. Physiatrist and rehab team continue to assess barriers to discharge/monitor patient progress toward functional and medical goals  Care Tool:  Bathing    Body parts bathed by patient: Right arm, Left arm, Chest, Abdomen, Front perineal area, Right upper leg, Left upper leg, Face   Body parts bathed by helper: Right lower leg, Left lower leg, Buttocks     Bathing assist Assist Level: Minimal Assistance - Patient > 75%     Upper Body Dressing/Undressing Upper body dressing   What is the patient wearing?: Pull over shirt, Bra    Upper body assist Assist Level: Minimal  Assistance - Patient > 75% (wearing a thick sweatshirt)    Lower Body Dressing/Undressing Lower body dressing      What is the patient wearing?: Pants, Incontinence brief     Lower body assist Assist for lower body dressing: Moderate Assistance - Patient 50 - 74%     Toileting Toileting    Toileting assist Assist for toileting: Minimal Assistance - Patient > 75%     Transfers Chair/bed transfer  Transfers assist     Chair/bed transfer assist level: Supervision/Verbal cueing     Locomotion Ambulation   Ambulation assist      Assist level: Minimal Assistance - Patient > 75% Assistive device: Walker-rolling Max distance: 18   Walk 10 feet activity   Assist  Walk 10 feet activity did not occur: Safety/medical concerns  Assist level: Contact Guard/Touching assist Assistive device: Walker-rolling   Walk 50 feet activity   Assist Walk 50 feet with 2 turns activity did not occur: Safety/medical concerns         Walk 150 feet activity   Assist Walk 150 feet activity did not occur: Safety/medical concerns         Walk 10 feet on uneven surface  activity   Assist Walk 10 feet on uneven surfaces activity did not occur: Safety/medical concerns         Wheelchair     Assist Is the patient using a wheelchair?: Yes Type of Wheelchair: Manual    Wheelchair assist level: Supervision/Verbal cueing Max wheelchair distance: 25    Wheelchair 50 feet with 2 turns activity    Assist        Assist Level: Moderate Assistance - Patient 50 - 74%   Wheelchair 150 feet activity     Assist      Assist Level: Dependent - Patient 0%   Blood pressure 131/69, pulse 72, temperature 98.3 F (36.8 C), temperature source Oral, resp. rate 18, height 5' 4 (1.626 m), weight 73.9 kg, SpO2 90%.  Medical Problem List and Plan: 1. 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  Possible punctate infarct  left middle cerebellar peduncle versus artifact.  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             -patient may  shower-cover L hip dressing with occlusive dressing             -ELOS/Goals: 13-15 days- mod I to supervision           -Continue CIR therapies including PT, OT, and SLP     2.  Antithrombotics: -DVT/anticoagulation:  Pharmaceutical: Lovenox .  Check  vascular study             -antiplatelet therapy: Aspirin  81 mg daily and Plavix  75 mg daily 3. Pain Management: Neurontin  800 mg 3 times daily, Voltaren  gel 4 g 4 times daily to affected area, oxycodone  5 mg every 4 hours as needed, tramadol  50 mg every 6 hours as needed, Robaxin  500 mg every 8 hours as needed- of note, doesn't like to take meds- encouraged her to do so prior to therapy  -9/12 increased left knee pain   -will add voltaren  gel placement to left knee   -?knee sleeve?   9/13: ordered ACE wrap for knee for compression, Voltaren  gel 4 times daily--9-14 patient has not received Voltaren  yet so we will continue with this plan.  4. Mood/Behavior/Sleep: Wellbutrin  150 mg twice daily, trazodone  25 mg nightly as needed             -antipsychotic agents: N/A 5. Neuropsych/cognition: This patient is capable of making decisions on her own behalf. 6. Skin/Wound Care: Routine skin checks 7. Fluids/Electrolytes/Nutrition: Routine in and outs with follow-up chemistries 8.  Left nondisplaced intertrochanteric hip fracture of the proximal left femur.  Status post IM nailing 11/19/2023.  Weightbearing as tolerated 9.  Acute blood loss anemia.  Follow-up CBC in am 9/8 10.  Hypertension.  Norvasc  10 mg daily, Coreg  25 mg twice daily, lisinopril  40 mg daily.     9/12 good control Vitals:   12/06/23 1949 12/07/23 0423  BP: (!) 130/93 131/69  Pulse: 73 72  Resp: 18 18  Temp: 98.2 F (36.8 C) 98.3 F (36.8 C)  SpO2: 92% 90%     11.  Diabetes mellitus.  Hemoglobin A1c 12.4.  Currently on Lantus  insulin  15 units every 24 hours.  Check blood sugars AC and at bedtime.  Prior to admission patient on Glucophage  500 mg daily, Trulicity weekly CBG (last 3)  Recent Labs    12/06/23 1700 12/06/23 2033 12/07/23 0602  GLUCAP 153* 142* 137*  Increase Lantus  to 20U 9/5- am CBG well controlled Metformin  started 9/5 and then stopped 9/12 Started novolog  3U TID-- 9/10--improving control  -increase to 4u tid 9-13: Blood sugar well-controlled.  Continue current regimen.  12.  AKI on CKD stage IIIB.  Follow-up chemistries    Latest Ref Rng & Units 12/01/2023    5:27 AM 11/26/2023    4:52 AM 11/22/2023    6:22 AM  BMP  Glucose 70 - 99 mg/dL 871  866  878   BUN 8 - 23 mg/dL 30  15  8    Creatinine 0.44 - 1.00 mg/dL 8.61  8.81  9.07   Sodium 135 - 145 mmol/L 136  135  136   Potassium 3.5 - 5.1 mmol/L 4.3  4.3  3.4   Chloride 98 - 111 mmol/L 100  99  102   CO2 22 - 32 mmol/L 27  26  27    Calcium  8.9 - 10.3 mg/dL 9.6  9.0  8.4     13.  Tobacco abuse.  NicoDerm patch.  Provide counseling 14.  Hyperlipidemia.  Lipitor 15.  Constipation.  Adjust bowel program as needed- gave Sorbitol  30cc since lBM 4-5 days ago- if doesn't have good results, suggest Soap Suds enema.   - 9/14: Last bowel movement 9-11; only standing bowel regimen is Colace 200 twice daily.  Switch to Senokot S 1 tabs twice daily and daily MiraLAX .  16. Urinary retention with overflow- ordered bladder scans and to cath q6 hours  prn if bladder scan >350cc- also ordered U/A and Cx, and Flomax  0.4 mg q supper -at this time U/A is in process- I ordered in/out caths if volumes >350cc  + Citrobacter and Enterobacter No sensitivities to Keflex  Both sensitive to nitrofurantoin  as well as Bactrim po.  Given some elevation of Creat will rx nitrofurantoin  x 7d thru sunday  9/12-bladder sx have resolved   -wean urecholine  to off  9-13: Bladder scans 250-108.  Continue to  monitor.  All continent  17. Mild spasticity- monitor for symptoms 18. Of note, pt's mother is 63 years old and still doing functionally well! 19. Thrush- ordered nystatin  5ml q6 hours for mild thrush- might need Diflucan - monitor    20.  Left shoulder pain with hx of RCR s/p recent fall -  Xray with mild OA, pain improving with time/PT,OT--   -9/9 resolved   21.  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  Continue Voltaren  gel 4 times daily.--X-rays with tricompartmental narrowing and suprapatellar effusion, no acute fracture.  -   LOS: 12 days A FACE TO FACE EVALUATION WAS PERFORMED  Joesph JAYSON Likes 12/07/2023, 9:42 AM

## 2023-12-07 NOTE — Progress Notes (Addendum)
 Occupational Therapy Session Note  Patient Details  Name: [name] MRN: [id number] Date of Birth: 15-Oct-1950  Today's Date: 12/07/2023 OT Individual Time: 0225-0330 OT Individual Time Calculation (min): 65 min    Short Term Goals: Week 1:  OT Short Term Goal 1 (Week 1): Patient will complete toilet transfer with mod assist OT Short Term Goal 1 - Progress (Week 1): Met OT Short Term Goal 2 (Week 1): Patient will don pants with mod assist OT Short Term Goal 2 - Progress (Week 1): Met OT Short Term Goal 3 (Week 1): Patient will complete toileting with mod assist OT Short Term Goal 3 - Progress (Week 1): Met  Skilled Therapeutic Interventions/Progress Updates:    Patient seated w/c LOF at the time of arrival near the restroom door, pt indicated that she needed to go to the bathroom.   The pt was transported to the restroom and was able to transfer from sit to stand using the grab bar at [facility] for coming into stand. The pt was able to doff her brief and pants with MinA  for placement on the commode. The pt was able to have a bowel mvmt and was able to complete peri care front and back with close S in standing holding the grabs bars for additional balance.  The pt was able to donn her LB garments a brief and pants with MaxA for the brief and MinA for her pants. The pt was transported to the sink area and was able to wash her hands with s/u A.  The pt went on to complete a simulated task in LB dressing 3x using theraband while using the arm of the w/c and the sink for coming into stand for donning the theraband over her hips and bottom at [facility].  The pt went on to complete UB exericise using yellow theraband 1 set of 10 for shld flexion, horizontal abduction, and shld extension using the  LUE as an assist. At the end of the session, the pt remained at w/c LOF with the call light and bedside table within reach with all additional needs addressed.  The pt's chair alarm was put in place and activated  prior to  exiting the room.  Therapy Documentation Precautions:  Precautions Precautions: Fall Recall of Precautions/Restrictions: Impaired Restrictions Weight Bearing Restrictions Per Provider Order: Yes LLE Weight Bearing Per Provider Order: Weight bearing as tolerated Other Position/Activity Restrictions: no restrictions Therapy/Group: Individual Therapy  Elvera JONETTA Mace 12/07/2023, 5:35 PM

## 2023-12-07 NOTE — Plan of Care (Signed)
  Problem: Consults Goal: RH STROKE PATIENT EDUCATION Description: See Patient Education module for education specifics  Outcome: Progressing   Problem: RH BOWEL ELIMINATION Goal: RH STG MANAGE BOWEL WITH ASSISTANCE Description: STG Manage Bowel with mod I  Assistance. Outcome: Progressing Goal: RH STG MANAGE BOWEL W/MEDICATION W/ASSISTANCE Description: STG Manage Bowel with Medication with mod I Assistance. Outcome: Progressing   Problem: RH SAFETY Goal: RH STG ADHERE TO SAFETY PRECAUTIONS W/ASSISTANCE/DEVICE Description: STG Adhere to Safety Precautions With cues Assistance/Device. Outcome: Progressing   Problem: RH PAIN MANAGEMENT Goal: RH STG PAIN MANAGED AT OR BELOW PT'S PAIN GOAL Description: Pain < 4 with prns Outcome: Progressing   Problem: RH KNOWLEDGE DEFICIT Goal: RH STG INCREASE KNOWLEDGE OF DIABETES Description: Patient and family will be able to manage DM using educational resources for medications and dietary modification independently Outcome: Progressing Goal: RH STG INCREASE KNOWLEDGE OF HYPERTENSION Description: Patient and family will be able to manage HTN using educational resources for medications and dietary modification independently Outcome: Progressing Goal: RH STG INCREASE KNOWLEGDE OF HYPERLIPIDEMIA Description: Patient and family will be able to manage HLD using educational resources for medications and dietary modification independently Outcome: Progressing Goal: RH STG INCREASE KNOWLEDGE OF STROKE PROPHYLAXIS Description: Patient and family will be able to manage secondary risks using educational resources for medications and dietary modification independently Outcome: Progressing

## 2023-12-08 LAB — BASIC METABOLIC PANEL WITH GFR
Anion gap: 9 (ref 5–15)
BUN: 25 mg/dL — ABNORMAL HIGH (ref 8–23)
CO2: 27 mmol/L (ref 22–32)
Calcium: 9.3 mg/dL (ref 8.9–10.3)
Chloride: 97 mmol/L — ABNORMAL LOW (ref 98–111)
Creatinine, Ser: 1.32 mg/dL — ABNORMAL HIGH (ref 0.44–1.00)
GFR, Estimated: 43 mL/min — ABNORMAL LOW (ref >60)
Glucose, Bld: 147 mg/dL — ABNORMAL HIGH (ref 70–99)
Potassium: 4.1 mmol/L (ref 3.5–5.1)
Sodium: 133 mmol/L — ABNORMAL LOW (ref 135–145)

## 2023-12-08 LAB — GLUCOSE, CAPILLARY
Glucose-Capillary: 165 mg/dL — ABNORMAL HIGH (ref 70–99)
Glucose-Capillary: 252 mg/dL — ABNORMAL HIGH (ref 70–99)
Glucose-Capillary: 216 mg/dL — ABNORMAL HIGH (ref 70–99)
Glucose-Capillary: 163 mg/dL — ABNORMAL HIGH (ref 70–99)

## 2023-12-08 MED ORDER — INSULIN ASPART 100 UNIT/ML IJ SOLN
4.0000 [IU] | Freq: Three times a day (TID) | INTRAMUSCULAR | Status: DC
  Administered 2023-12-08 – 2023-12-10 (×7): 4 [IU] via SUBCUTANEOUS

## 2023-12-08 NOTE — Progress Notes (Addendum)
 Patient ID: [name], female   DOB: 12/24/1950, 74 y.o.   MRN: [id number] Met with pt to discuss bed offers and she has accepted [facility] and will begin the insurance auth. Will have a bed on Wednesday. Pt reports the MD may inject her knee but she is thinking about it.  11:41 AM Pt asking to go to the bank to pay a bill since all in her name. Informed her if she leaves she is leaving AMA and not being re-admitted. She asked if I can re-admit myself and was told would need to go thru the ER to do this and if no medical need would not be re-admitted. Discussed hopeful to get insurance to cover for her to go to a SNF but in case they do not would need to go home.   2:05 PM Reached out to [facility] to begin the process for approval for the SNF. Ref number  [id number]. Have faxed clinicals and will await response.

## 2023-12-08 NOTE — Progress Notes (Signed)
 Speech Language Pathology Daily Session Note  Patient Details  Name: [name] MRN: [id number] Date of Birth: Jul 13, 1950  Today's Date: 12/08/2023 SLP Individual Time: 1350-1450 SLP Individual Time Calculation (min): 60 min  Short Term Goals: Week 2: SLP Short Term Goal 1 (Week 2): STG = LTG due to ELOS  Skilled Therapeutic Interventions: Skilled therapy session focused on cognitive goals. SLP facilitated session by reviewing calendar and schedule. Patient with difficulty remembering how to utilize therapy schedule requiring re-education. Patient seemingly more confused this date requiring increased assistance during completion of memory book. Patient required modA to recall activities completed in PT/OT this AM. SLP targeted problem solving goals through calendar completion and comprehension task. Patient accurately completed calendar independently and answered comprehension questions with 85% accuracy. At the end of the session, patient participated in abstract card game with minA. Patient left in [facility] with alarm set and call bell in reach. Continue POC.  Pain None reported   Therapy/Group: Individual Therapy  Lavon Horn M.A., CCC-SLP 12/08/2023, 12:28 PM

## 2023-12-08 NOTE — Progress Notes (Signed)
 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  + Left knee pain   Objective:   DG Knee 1-2 Views Left Result Date: 12/07/2023 CLINICAL DATA:  Left knee pain. EXAM: LEFT KNEE - 1-2 VIEW COMPARISON:  None Available. FINDINGS: There is no acute fracture or dislocation. The bones are osteopenic. Severe arthritic changes of the knee with tricompartmental narrowing and osteophyte/spurring. There is a small suprapatellar effusion. The soft tissues are unremarkable. IMPRESSION: 1. No acute fracture or dislocation. 2. Severe arthritic changes. Electronically Signed   By: Vanetta Chou M.D.   On: 12/07/2023 13:51    No results for input(s): WBC, HGB, HCT, PLT in the last 72 hours.  Recent Labs    12/07/23 2358  NA 133*  K 4.1  CL 97*  CO2 27  GLUCOSE 147*  BUN 25*  CREATININE 1.32*  CALCIUM  9.3      Intake/Output Summary (Last 24 hours) at 12/08/2023 0857 Last data filed at 12/08/2023 0800 Gross per 24 hour  Intake 460 ml  Output 600 ml  Net -140 ml        Physical Exam: Vital Signs Blood pressure 117/62, pulse 75, temperature 98.4 F (36.9 C), temperature source Oral, resp. rate 16, height 5' 4 (1.626 m), weight 73.9 kg, SpO2 97%.  General: No acute distress Mood and affect are appropriate Heart: Regular rate and rhythm no rubs murmurs or extra sounds Lungs: Clear to auscultation, breathing unlabored, no rales or wheezes Abdomen: Positive bowel sounds, soft nontender to palpation, nondistended Extremities: No clubbing,  cyanosis, or edema Skin: No evidence of breakdown, no evidence of rash  Neurologic: Cranial nerves II through XII intact,  Moving all 4 limbs antigravity against resistance in bed 9-13: See prior exams below  motor strength is 5/5 in right deltoid, bicep, tricep, grip, hip flexor, knee extensors, ankle dorsiflexor and plantar flexor Left delt and biceps limited by pain but at least 3-/5  deltoid ,biceps, triceps  grip, WF, WE. LLE limited by pain for 3- HF, KE, 4/5 ankle PF and DF Sensory exam normal sensation to light touch and proprioception in bilateral upper and lower extremities.   Musculoskeletal: full AROM BUE for flexion ! Left knee contracture lack 20 deg from full ext, + bony enlargement of joint, pain with palpation at inferior patella but not along lateral or medial joint line, posterior joint, or superior joint.  Unchanged exam 9-14   Assessment/Plan: 1. Functional deficits which require 3+ hours per day of interdisciplinary therapy in a comprehensive inpatient rehab setting. Physiatrist is providing close team supervision and 24 hour management of active medical problems listed below. Physiatrist and rehab team continue to assess barriers to discharge/monitor patient progress toward functional and medical goals  Care Tool:  Bathing    Body parts bathed by patient:  Right arm, Left arm, Chest, Abdomen, Front perineal area, Right upper leg, Left upper leg, Face   Body parts bathed by helper: Right lower leg, Left lower leg, Buttocks     Bathing assist Assist Level: Minimal Assistance - Patient > 75%     Upper Body Dressing/Undressing Upper body dressing   What is the patient wearing?: Pull over shirt, Bra    Upper body assist Assist Level: Minimal Assistance - Patient > 75% (wearing a thick sweatshirt)    Lower Body Dressing/Undressing Lower body dressing      What is the patient wearing?: Pants, Incontinence brief     Lower body assist Assist for lower body  dressing: Moderate Assistance - Patient 50 - 74%     Toileting Toileting    Toileting assist Assist for toileting: Minimal Assistance - Patient > 75%     Transfers Chair/bed transfer  Transfers assist     Chair/bed transfer assist level: Supervision/Verbal cueing     Locomotion Ambulation   Ambulation assist      Assist level: Minimal Assistance - Patient > 75% Assistive device: Walker-rolling Max distance: 18   Walk 10 feet activity   Assist  Walk 10 feet activity did not occur: Safety/medical concerns  Assist level: Contact Guard/Touching assist Assistive device: Walker-rolling   Walk 50 feet activity   Assist Walk 50 feet with 2 turns activity did not occur: Safety/medical concerns         Walk 150 feet activity   Assist Walk 150 feet activity did not occur: Safety/medical concerns         Walk 10 feet on uneven surface  activity   Assist Walk 10 feet on uneven surfaces activity did not occur: Safety/medical concerns         Wheelchair     Assist Is the patient using a wheelchair?: Yes Type of Wheelchair: Manual    Wheelchair assist level: Supervision/Verbal cueing Max wheelchair distance: 25    Wheelchair 50 feet with 2 turns activity    Assist        Assist Level: Moderate Assistance - Patient 50 - 74%   Wheelchair 150 feet activity     Assist      Assist Level: Dependent - Patient 0%   Blood pressure 117/62, pulse 75, temperature 98.4 F (36.9 C), temperature source Oral, resp. rate 16, height 5' 4 (1.626 m), weight 73.9 kg, SpO2 97%.  Medical Problem List and Plan: 1. 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  Possible punctate infarct left middle cerebellar peduncle versus artifact.  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             -patient may   shower-cover L hip dressing with occlusive dressing             -ELOS/Goals: 13-15 days- mod I to supervision           -Continue CIR therapies including PT, OT, and SLP     2.  Antithrombotics: -DVT/anticoagulation:  Pharmaceutical: Lovenox .  Check vascular study             -antiplatelet therapy: Aspirin  81 mg daily and Plavix  75 mg daily 3. Pain Management: Neurontin  800 mg 3 times daily, Voltaren  gel 4 g 4 times daily to affected area, oxycodone  5 mg every 4 hours as needed, tramadol  50 mg every 6 hours as  needed, Robaxin  500 mg every 8 hours as needed- of note, doesn't like to take meds- encouraged her to do so prior to therapy  -9/12 increased left knee pain   -will add voltaren  gel placement to left knee   -?knee sleeve?   9/13: ordered ACE wrap for knee for compression, Voltaren  gel 4 times daily--9-14 patient has not received Voltaren  yet so we will continue with this plan.  4. Mood/Behavior/Sleep: Wellbutrin  150 mg twice daily, trazodone  25 mg nightly as needed             -antipsychotic agents: N/A 5. Neuropsych/cognition: This patient is capable of making decisions on her own behalf. 6. Skin/Wound Care: Routine skin checks 7. Fluids/Electrolytes/Nutrition: Routine in and outs with follow-up chemistries 8.  Left nondisplaced intertrochanteric hip fracture of the proximal left femur.  Status post IM nailing 11/19/2023.  Weightbearing as tolerated 9.  Acute blood loss anemia.  Follow-up CBC in am 9/8 10.  Hypertension.  Norvasc  10 mg daily, Coreg  25 mg twice daily, lisinopril  40 mg daily.     9/12 good control Vitals:   12/08/23 0357 12/08/23 0821  BP: (!) 120/57 117/62  Pulse: 73 75  Resp: 16   Temp: 99 F (37.2 C) 98.4 F (36.9 C)  SpO2: 97%     11.  Diabetes mellitus.  Hemoglobin A1c 12.4.  Currently on Lantus  insulin  15 units every 24 hours.  Check blood sugars AC and at bedtime.  Prior to admission patient on Glucophage  500 mg daily, Trulicity weekly CBG (last 3)   Recent Labs    12/07/23 1719 12/07/23 2043 12/08/23 0537  GLUCAP 219* 223* 165*  Increase Lantus  to 20U 9/5- am CBG well controlled Metformin  started 9/5 and then stopped 9/12 Started novolog  3U TID-- 9/10--improving control  -increase to 4u tid 9/15   12.  AKI on CKD stage IIIB.  Follow-up chemistries    Latest Ref Rng & Units 12/07/2023   11:58 PM 12/01/2023    5:27 AM 11/26/2023    4:52 AM  BMP  Glucose 70 - 99 mg/dL 852  871  866   BUN 8 - 23 mg/dL 25  30  15    Creatinine 0.44 - 1.00 mg/dL 8.67  8.61  8.81   Sodium 135 - 145 mmol/L 133  136  135   Potassium 3.5 - 5.1 mmol/L 4.1  4.3  4.3   Chloride 98 - 111 mmol/L 97  100  99   CO2 22 - 32 mmol/L 27  27  26    Calcium  8.9 - 10.3 mg/dL 9.3  9.6  9.0     13.  Tobacco abuse.  NicoDerm patch.  Provide counseling 14.  Hyperlipidemia.  Lipitor 15.  Constipation.  Adjust bowel program as needed- gave Sorbitol  30cc since lBM 4-5 days ago- if doesn't have good results, suggest Soap Suds enema.   - 9/14: Last bowel movement type 3 small   16. Urinary retention with overflow- resolved d/c flomax  9/15  9-13: Bladder scans 250-108.  Continue to monitor.  All continent  17. Mild spasticity- monitor for symptoms  18.  Left shoulder pain with hx of RCR s/p recent fall -  Xray with mild OA, pain improving with time/PT,OT--  ROM normal    21.  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

## 2023-12-08 NOTE — Progress Notes (Signed)
 Occupational Therapy Session Note  Patient Details  Name: [name] MRN: [id number] Date of Birth: Nov 09, 1950  Today's Date: 12/08/2023 OT Individual Time: 8969-8884 OT Individual Time Calculation (min): 45 min    Short Term Goals: Week 1:  OT Short Term Goal 1 (Week 1): Patient will complete toilet transfer with mod assist OT Short Term Goal 1 - Progress (Week 1): Met OT Short Term Goal 2 (Week 1): Patient will don pants with mod assist OT Short Term Goal 2 - Progress (Week 1): Met OT Short Term Goal 3 (Week 1): Patient will complete toileting with mod assist OT Short Term Goal 3 - Progress (Week 1): Met Week 2:  OT Short Term Goal 1 (Week 2): Pt  will complete toilet transfers with min A OT Short Term Goal 2 (Week 2): Pt will complete toileting with min A OT Short Term Goal 3 (Week 2): Pt will complete bathing with S. OT Short Term Goal 4 (Week 2): Pt will complete tub bench transfer with mod A.  Skilled Therapeutic Interventions/Progress Updates:    Pt received in bathroom with NT in room. Pt had just finished toileting and was able to self cleanse and manage clothing without A.   She was about to ambulate out of bathroom when she stated she felt dizzy.  Cued her to sit back down onto toilet and relax. Pt was then able to walk out of bathroom to chair with CGA.  Pt then sat in wc to complete grooming tasks and changing clothing.   Pt is supervision with all UB self care,  LB undressing and dressing CGA.  Pt told she is married but her husband lives in a different home.  She would like to go home but realizes she needs more care until her hip fracture heals and improved cognition.    Pt resting in wc with belt alarm on and all needs met.  Son arrived at end of session.    Therapy Documentation Precautions:  Precautions Precautions: Fall Recall of Precautions/Restrictions: Impaired Restrictions Weight Bearing Restrictions Per Provider Order: Yes LLE Weight Bearing Per  Provider Order: Weight bearing as tolerated Other Position/Activity Restrictions: no restrictions    Vital Signs: Therapy Vitals Temp: 98.4 F (36.9 C) Temp Source: Oral Pulse Rate: 75 BP: 117/62 Patient Position (if appropriate): Lying Pain: Pain Assessment Pain Scale: 0-10 Pain Score: 3  Pain Location: Leg Pain Intervention(s): Medication (See eMAR) ADL: ADL Eating: Set up Grooming: Setup Where Assessed-Grooming: Sitting at sink Upper Body Bathing: Supervision/safety Where Assessed-Upper Body Bathing: Shower Lower Body Bathing: Minimal assistance Where Assessed-Lower Body Bathing: Shower Upper Body Dressing: Supervision/safety Where Assessed-Upper Body Dressing: Wheelchair Lower Body Dressing: Contact guard Where Assessed-Lower Body Dressing: Wheelchair Toileting: Contact guard Where Assessed-Toileting: Teacher, adult education: Furniture conservator/restorer Method: Proofreader: Raised toilet seat, Grab bars Tub/Shower Transfer: Unable to assess Tub/Shower Transfer Method: Unable to assess Film/video editor: Minimal assistance Film/video editor Method: Stand pivot [employer]: Emergency planning/management officer, Grab bars   Therapy/Group: Individual Therapy  [name] 12/08/2023, 12:10 PM

## 2023-12-08 NOTE — Plan of Care (Signed)
  Problem: RH Stairs Goal: LTG Patient will ambulate up and down stairs w/assist (PT) Description: LTG: Patient will ambulate up and down # of stairs with assistance (PT) Outcome: Not Applicable  Deemed not applicable d/t pt's slow progress in therapy.

## 2023-12-08 NOTE — Progress Notes (Signed)
 Occupational Therapy Discharge Summary  Patient Details  Name: [name] MRN: [id number] Date of Birth: 02-13-1951  Date of Discharge from OT service:December 09, 2023   Patient has met {NUMBERS [id number]} of 10 long term goals due to improved activity tolerance, improved balance, postural control, ability to compensate for deficits, functional use of  LEFT upper extremity, improved attention, improved awareness, and improved coordination.  Patient to discharge at [facility] Assist level.  Patient's care partner unavailable to provide the necessary physical and cognitive assistance at discharge.    Reasons goals not met: ***  Recommendation:  Patient will benefit from ongoing skilled OT services in skilled nursing facility setting to continue to advance functional skills in the area of BADL and iADL.  Equipment: No equipment provided  Reasons for discharge: treatment goals met  Patient/family agrees with progress made and goals achieved: Yes  OT Discharge Precautions/Restrictions    General   Vital Signs   Pain Pain Assessment Pain Scale: 0-10 Pain Score: 5  Pain Location: Leg Pain Intervention(s): Medication (See eMAR) ADL ADL Eating: Set up Grooming: Setup Where Assessed-Grooming: Sitting at sink Upper Body Bathing: Supervision/safety Where Assessed-Upper Body Bathing: Shower Lower Body Bathing: Minimal assistance Where Assessed-Lower Body Bathing: Shower Upper Body Dressing: Supervision/safety Where Assessed-Upper Body Dressing: Wheelchair Lower Body Dressing: Contact guard Where Assessed-Lower Body Dressing: Wheelchair Toileting: Contact guard Where Assessed-Toileting: Teacher, adult education: Furniture conservator/restorer Method: Proofreader: Raised toilet seat, Grab bars Tub/Shower Transfer: Unable to assess Tub/Shower Transfer Method: Unable to assess Film/video editor: Minimal assistance Film/video editor  Method: Warden/ranger: Emergency planning/management officer, Radio producer    Mobility     Trunk/Postural Assessment     Balance   Extremity/Trunk Assessment       [name] 12/08/2023, 12:34 PM

## 2023-12-08 NOTE — Progress Notes (Signed)
 Physical Therapy Session Note  Patient Details  Name: [name] MRN: [id number] Date of Birth: Apr 27, 1950  Today's Date: 12/08/2023 PT Individual Time: 1128-1207 PT Individual Time Calculation (min): 39 min   Short Term Goals: Week 1:  PT Short Term Goal 1 (Week 1): Pt will transfer sup to sit w/ min A PT Short Term Goal 1 - Progress (Week 1): Met PT Short Term Goal 2 (Week 1): Pt will transfer sit to stand w/ min A consistently. PT Short Term Goal 2 - Progress (Week 1): Met PT Short Term Goal 3 (Week 1): Pt will amb x 25' w/ RW and min A PT Short Term Goal 3 - Progress (Week 1): Progressing toward goal  Skilled Therapeutic Interventions/Progress Updates:    Pt presents in room in [facility], reporting that she needs to go to the bank and wishing to speak to someone about this. Therapist communicates with SW who presents in room at start of session to explain hospital policies. Pt highly distractible and difficult to motivate to participate with therapy however pt son present and does assist in encouraging pt to trial ambulation. Pt requires frequent cues for attention to task. Pt transported to day room for energy conservation. Pt ambulates 22' with RW CGA and same person WC follow, pt attempting to sit on rolling stool, therapist provides cues and education for safety to sit in [facility]. Pt completes transfers with supervision with RW. Pt then completes WC mobility back to room with supervision, increased time due to distractibility, requires cues for attention to task. Pt returns to room and remains seated in [facility] with all needs within reach, cal light in place and chair alarm donned and activated at end of session.   Therapy Documentation Precautions:  Precautions Precautions: Fall Recall of Precautions/Restrictions: Impaired Restrictions Weight Bearing Restrictions Per Provider Order: Yes LLE Weight Bearing Per Provider Order: Weight bearing as tolerated Other Position/Activity Restrictions:  no restrictions   Therapy/Group: Individual Therapy  Reche Ohara PT, DPT 12/08/2023, 12:10 PM

## 2023-12-08 NOTE — Progress Notes (Signed)
 Physical Therapy Session Note  Patient Details  Name: [name] MRN: [id number] Date of Birth: April 20, 1950  Today's Date: 12/08/2023 PT Individual Time: 0920-1003 PT Individual Time Calculation (min): 43 min   Short Term Goals: Week 1:  PT Short Term Goal 1 (Week 1): Pt will transfer sup to sit w/ min A PT Short Term Goal 1 - Progress (Week 1): Met PT Short Term Goal 2 (Week 1): Pt will transfer sit to stand w/ min A consistently. PT Short Term Goal 2 - Progress (Week 1): Met PT Short Term Goal 3 (Week 1): Pt will amb x 25' w/ RW and min A PT Short Term Goal 3 - Progress (Week 1): Progressing toward goal   Skilled Therapeutic Interventions/Progress Updates:    Pt presents in bed, agreeable to session. Extra time for all mobility. Pt in long sitting applied pants, lotion to LE's and socks independently. Rolling in the bed with supervision to apply new brief. Pt bridging to pull off old brief and pull up pants. Min A to EOB for UE support to pull up. Close S for sit > stand with RW with cues for technique and CGA for short distance walking to the sink. Performed face washing and hygiene at sink in seated position mod I. Education provided on overall goals and progress in regards to mobility. With encouragement, participated in gait with RW x 15' including a turn with CGA and slow step to pattern. Transitioned back to w/c and set up with all needs in reach.   Therapy Documentation Precautions:  Precautions Precautions: Fall Recall of Precautions/Restrictions: Impaired Restrictions Weight Bearing Restrictions Per Provider Order: Yes LLE Weight Bearing Per Provider Order: Weight bearing as tolerated Other Position/Activity Restrictions: no restrictions  Pain: Denies pain currently.    Therapy/Group: Individual Therapy  [name] Elnor, PT, DPT, CBIS  12/08/2023, 11:14 AM

## 2023-12-09 LAB — GLUCOSE, CAPILLARY
Glucose-Capillary: 152 mg/dL — ABNORMAL HIGH (ref 70–99)
Glucose-Capillary: 216 mg/dL — ABNORMAL HIGH (ref 70–99)
Glucose-Capillary: 158 mg/dL — ABNORMAL HIGH (ref 70–99)
Glucose-Capillary: 196 mg/dL — ABNORMAL HIGH (ref 70–99)

## 2023-12-09 MED ORDER — METHOCARBAMOL 500 MG PO TABS: 500.0000 mg | ORAL_TABLET | Freq: Three times a day (TID) | ORAL | Status: AC | PRN

## 2023-12-09 MED ORDER — NICOTINE 21 MG/24HR TD PT24: MEDICATED_PATCH | TRANSDERMAL | 0 refills | Status: AC

## 2023-12-09 MED ORDER — GABAPENTIN 400 MG PO CAPS: 800.0000 mg | ORAL_CAPSULE | Freq: Three times a day (TID) | ORAL | Status: AC

## 2023-12-09 MED ORDER — DICLOFENAC SODIUM 1 % EX GEL: 4.0000 g | Freq: Four times a day (QID) | CUTANEOUS | Status: AC

## 2023-12-09 MED ORDER — OXYCODONE HCL 5 MG PO TABS: 5.0000 mg | ORAL_TABLET | ORAL | 0 refills | Status: AC | PRN

## 2023-12-09 MED ORDER — POLYETHYLENE GLYCOL 3350 17 G PO PACK: 17.0000 g | PACK | Freq: Every day | ORAL | Status: AC

## 2023-12-09 MED ORDER — ADULT MULTIVITAMIN W/MINERALS CH: 1.0000 | ORAL_TABLET | Freq: Every day | ORAL | Status: AC

## 2023-12-09 NOTE — Plan of Care (Signed)
  Problem: Consults Goal: RH STROKE PATIENT EDUCATION Description: See Patient Education module for education specifics  Outcome: Progressing   Problem: RH BOWEL ELIMINATION Goal: RH STG MANAGE BOWEL WITH ASSISTANCE Description: STG Manage Bowel with mod I  Assistance. Outcome: Progressing Goal: RH STG MANAGE BOWEL W/MEDICATION W/ASSISTANCE Description: STG Manage Bowel with Medication with mod I Assistance. Outcome: Progressing   Problem: RH SAFETY Goal: RH STG ADHERE TO SAFETY PRECAUTIONS W/ASSISTANCE/DEVICE Description: STG Adhere to Safety Precautions With cues Assistance/Device. Outcome: Progressing   Problem: RH PAIN MANAGEMENT Goal: RH STG PAIN MANAGED AT OR BELOW PT'S PAIN GOAL Description: Pain < 4 with prns Outcome: Progressing   Problem: RH KNOWLEDGE DEFICIT Goal: RH STG INCREASE KNOWLEDGE OF DIABETES Description: Patient and family will be able to manage DM using educational resources for medications and dietary modification independently Outcome: Progressing Goal: RH STG INCREASE KNOWLEDGE OF HYPERTENSION Description: Patient and family will be able to manage HTN using educational resources for medications and dietary modification independently Outcome: Progressing Goal: RH STG INCREASE KNOWLEGDE OF HYPERLIPIDEMIA Description: Patient and family will be able to manage HLD using educational resources for medications and dietary modification independently Outcome: Progressing Goal: RH STG INCREASE KNOWLEDGE OF STROKE PROPHYLAXIS Description: Patient and family will be able to manage secondary risks using educational resources for medications and dietary modification independently Outcome: Progressing

## 2023-12-09 NOTE — Progress Notes (Signed)
 PROGRESS NOTE   Subjective/Complaints:  Doing ok today no knee pain or shoulder pain today    ROS: Patient denies fever, rash, sore throat, blurred vision, dizziness, nausea, vomiting, diarrhea, cough, shortness of breath or chest pain,  back/neck pain, headache, or mood change.  + Left knee pain   Objective:   DG Knee 1-2 Views Left Result Date: 12/07/2023 CLINICAL DATA:  Left knee pain. EXAM: LEFT KNEE - 1-2 VIEW COMPARISON:  None Available. FINDINGS: There is no acute fracture or dislocation. The bones are osteopenic. Severe arthritic changes of the knee with tricompartmental narrowing and osteophyte/spurring. There is a small suprapatellar effusion. The soft tissues are unremarkable. IMPRESSION: 1. No acute fracture or dislocation. 2. Severe arthritic changes. Electronically Signed   By: Vanetta Chou M.D.   On: 12/07/2023 13:51    No results for input(s): WBC, HGB, HCT, PLT in the last 72 hours.  Recent Labs    12/07/23 2358  NA 133*  K 4.1  CL 97*  CO2 27  GLUCOSE 147*  BUN 25*  CREATININE 1.32*  CALCIUM  9.3      Intake/Output Summary (Last 24 hours) at 12/09/2023 0836 Last data filed at 12/09/2023 0823 Gross per 24 hour  Intake 1330 ml  Output 850 ml  Net 480 ml        Physical Exam: Vital Signs Blood pressure 124/62, pulse 73, temperature 97.8 F (36.6 C), resp. rate 16, height 5' 4 (1.626 m), weight 73.9 kg, SpO2 100%.  General: No acute distress Mood and affect are appropriate Heart: Regular rate and rhythm no rubs murmurs or extra sounds Lungs: Clear to auscultation, breathing unlabored, no rales or wheezes Abdomen: Positive bowel sounds, soft nontender to palpation, nondistended Extremities: No clubbing, cyanosis, or edema Skin: No evidence of breakdown, no evidence of rash  Neurologic: Cranial nerves II through XII intact,    motor strength is 5/5 in right deltoid, bicep,  tricep, grip, hip flexor, knee extensors, ankle dorsiflexor and plantar flexor Left  4-/5  deltoid ,biceps, triceps  grip, WF, WE. LLE limited by pain for 3- HF, KE, 4/5 ankle PF and DF   Musculoskeletal: full AROM BUE for flexion ! Left knee contracture lack 20 deg from full ext, + bony enlargement    Assessment/Plan: 1. Functional deficits which require 3+ hours per day of interdisciplinary therapy in a comprehensive inpatient rehab setting. Physiatrist is providing close team supervision and 24 hour management of active medical problems listed below. Physiatrist and rehab team continue to assess barriers to discharge/monitor patient progress toward functional and medical goals  Care Tool:  Bathing    Body parts bathed by patient: Right arm, Left arm, Chest, Abdomen, Front perineal area, Right upper leg, Left upper leg, Face   Body parts bathed by helper: Right lower leg, Left lower leg, Buttocks     Bathing assist Assist Level: Minimal Assistance - Patient > 75%     Upper Body Dressing/Undressing Upper body dressing   What is the patient wearing?: Pull over shirt, Bra    Upper body assist Assist Level: Set up assist    Lower Body Dressing/Undressing Lower body dressing  What is the patient wearing?: Pants, Incontinence brief     Lower body assist Assist for lower body dressing: Contact Guard/Touching assist     Toileting Toileting    Toileting assist Assist for toileting: Contact Guard/Touching assist     Transfers Chair/bed transfer  Transfers assist     Chair/bed transfer assist level: Contact Guard/Touching assist     Locomotion Ambulation   Ambulation assist      Assist level: Contact Guard/Touching assist Assistive device: Walker-rolling Max distance: 15'   Walk 10 feet activity   Assist  Walk 10 feet activity did not occur: Safety/medical concerns  Assist level: Contact Guard/Touching assist Assistive device: Walker-rolling    Walk 50 feet activity   Assist Walk 50 feet with 2 turns activity did not occur: Safety/medical concerns         Walk 150 feet activity   Assist Walk 150 feet activity did not occur: Safety/medical concerns         Walk 10 feet on uneven surface  activity   Assist Walk 10 feet on uneven surfaces activity did not occur: Safety/medical concerns         Wheelchair     Assist Is the patient using a wheelchair?: Yes Type of Wheelchair: Manual    Wheelchair assist level: Supervision/Verbal cueing Max wheelchair distance: 25    Wheelchair 50 feet with 2 turns activity    Assist        Assist Level: Moderate Assistance - Patient 50 - 74%   Wheelchair 150 feet activity     Assist      Assist Level: Dependent - Patient 0%   Blood pressure 124/62, pulse 73, temperature 97.8 F (36.6 C), resp. rate 16, height 5' 4 (1.626 m), weight 73.9 kg, SpO2 100%.  Medical Problem List and Plan: 1. 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  Possible punctate infarct left middle cerebellar peduncle versus artifact.  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             -patient may  shower-cover L hip dressing with occlusive dressing             Plan d/c to [facility] in am            -Continue CIR therapies including PT, OT, and SLP     2.  Antithrombotics: -DVT/anticoagulation:  Pharmaceutical: Lovenox .  Check vascular study             -antiplatelet therapy: Aspirin  81 mg daily and Plavix  75 mg daily 3. Pain Management: Neurontin  800 mg 3 times daily, Voltaren  gel 4 g 4 times daily to affected area, oxycodone  5 mg every 4 hours as needed, tramadol  50 mg every 6 hours as needed, Robaxin  500 mg every 8 hours as needed- of note, doesn't like to take meds- encouraged her to do so prior to therapy  -9/12  increased left knee pain   -will add voltaren  gel placement to left knee   -?knee sleeve?   9/13: ordered ACE wrap for knee for compression, Voltaren  gel 4 times daily--9-14 patient has not received Voltaren  yet so we will continue with this plan.  4. Mood/Behavior/Sleep: Wellbutrin  150 mg twice daily, trazodone  25 mg nightly as needed             -antipsychotic agents: N/A 5. Neuropsych/cognition: This patient is capable of  making decisions on her own behalf. 6. Skin/Wound Care: Routine skin checks 7. Fluids/Electrolytes/Nutrition: Routine in and outs with follow-up chemistries 8.  Left nondisplaced intertrochanteric hip fracture of the proximal left femur.  Status post IM nailing 11/19/2023.  Weightbearing as tolerated 9.  Acute blood loss anemia.  Follow-up CBC in am 9/8 10.  Hypertension.  Norvasc  10 mg daily, Coreg  25 mg twice daily, lisinopril  40 mg daily.     9/12 good control Vitals:   12/09/23 0334 12/09/23 0745  BP: (!) 110/56 124/62  Pulse: 72 73  Resp: 16   Temp: 97.8 F (36.6 C)   SpO2: 100%     11.  Diabetes mellitus.  Hemoglobin A1c 12.4.  Currently on Lantus  insulin  15 units every 24 hours.  Check blood sugars AC and at bedtime.  Prior to admission patient on Glucophage  500 mg daily, Trulicity weekly CBG (last 3)  Recent Labs    12/08/23 1658 12/08/23 2114 12/09/23 0555  GLUCAP 216* 163* 152*  Increase Lantus  to 20U 9/5- am CBG well controlled Metformin  started 9/5 and then stopped 9/12 Started novolog  3U TID-- 9/10--improving control  -increase to 4u tid 9/15- improved 9/16   12.  AKI on CKD stage IIIB.  Follow-up chemistries    Latest Ref Rng & Units 12/07/2023   11:58 PM 12/01/2023    5:27 AM 11/26/2023    4:52 AM  BMP  Glucose 70 - 99 mg/dL 852  871  866   BUN 8 - 23 mg/dL 25  30  15    Creatinine 0.44 - 1.00 mg/dL 8.67  8.61  8.81   Sodium 135 - 145 mmol/L 133  136  135   Potassium 3.5 - 5.1 mmol/L 4.1  4.3  4.3   Chloride 98 - 111 mmol/L 97  100  99    CO2 22 - 32 mmol/L 27  27  26    Calcium  8.9 - 10.3 mg/dL 9.3  9.6  9.0     13.  Tobacco abuse.  NicoDerm patch.  Provide counseling 14.  Hyperlipidemia.  Lipitor 15.  Constipation.  Adjust bowel program as needed- gave Sorbitol  30cc since lBM 4-5 days ago- if doesn't have good results, suggest Soap Suds enema.   - 9/14: Last bowel movement type 3 small   16. Urinary retention with overflow- resolved d/c flomax  9/15  9-13: Bladder scans 250-108.  Continue to monitor.  All continent  17. Mild spasticity- monitor for symptoms  18.  Left shoulder pain improved with OT no further c/o with AROM   21.  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

## 2023-12-09 NOTE — Progress Notes (Signed)
 Inpatient Rehabilitation Discharge Medication Review by a Pharmacist  A complete drug regimen review was completed for this patient to identify any potential clinically significant medication issues.  High Risk Drug Classes Is patient taking? Indication by Medication  Antipsychotic No   Anticoagulant No   Antibiotic No   Opioid Yes Oxycodone : pain  Antiplatelet Yes Aspirin  and Plavix : CVA ppx  Hypoglycemics/insulin  Yes Tresiba , metformin , Trulicity: DM  Vasoactive Medication Yes Amlodipine , carvedilol , lisinopril : HTN   Chemotherapy No   Other Yes Tylenol , voltaren  gel: pain Lipitor: HLD Bupropion : moood Gabapentin : pain Robaxin : muscle spasms  B12, Calcium /VitD, MVI: supplement Nicoderm: smoking cessation Docusate, Miralax : constipation     Type of Medication Issue Identified Description of Issue Recommendation(s)  Drug Interaction(s) (clinically significant)     Duplicate Therapy     Allergy     No Medication Administration End Date     Incorrect Dose     Additional Drug Therapy Needed     Significant med changes from prior encounter (inform family/care partners about these prior to discharge).  Restart or discontinue as appropriate. Communicate medication changes with patient/family at discharge  Other       Clinically significant medication issues were identified that warrant physician communication and completion of prescribed/recommended actions by midnight of the next day:  No  Time spent performing this drug regimen review (minutes): 30  Thank you for allowing pharmacy to be a part of this patient's care.   Bascom JAYSON Louder, PharmD 12/09/2023 10:02 AM   **Pharmacist phone directory can be found on [email or web address] listed under [facility]**

## 2023-12-09 NOTE — Progress Notes (Signed)
 Speech Language Pathology Daily Session Note  Patient Details  Name: [name] MRN: [id number] Date of Birth: May 02, 1950  Today's Date: 12/09/2023 SLP Individual Time: 0800-0858 SLP Individual Time Calculation (min): 58 min  Short Term Goals: Week 2: SLP Short Term Goal 1 (Week 2): STG = LTG due to ELOS  Skilled Therapeutic Interventions: Skilled therapy session focused on cognitive goals. SLP facilitated session by encouraging patient to review schedule and discuss d/c plan. Patient independently identified each therapist and explained schedule for the day - this is an improvement since yesterday. SLP then provided minA for patient to utilize schedule to complete memory book. SLP then completed re-evaluation of patients cognitive linguistic skills through use of the Cognistat. On evaluation, SLP noted patient with mild deficits in executive functioning skills, moderate deficits in visual spatial construction and severe deficits in delayed recall. Informally, noted deficits in short/long term memory (difficulty recalling information re: jobs, family), sustained attention and slowed processing speed. Patient with improvements in the subtests of memory and problem solving since evaluation and informally patient able to recall all biographical information and sustain attention to task. Patient left in bed with alarm set and call bell in reach. Continue POC  Pain None reported   Therapy/Group: Individual Therapy  Christophor Eick M.A., CCC-SLP 12/09/2023, 7:33 AM

## 2023-12-09 NOTE — Progress Notes (Addendum)
 Occupational Therapy Session Note  Patient Details  Name: [name] MRN: [id number] Date of Birth: 02/09/1951  Today's Date: 12/09/2023 OT Individual Time: 1416-1500 OT Individual Time Calculation (min): 44 min    Short Term Goals: Week 2:  OT Short Term Goal 1 (Week 2): Pt  will complete toilet transfers with min A OT Short Term Goal 2 (Week 2): Pt will complete toileting with min A OT Short Term Goal 3 (Week 2): Pt will complete bathing with S. OT Short Term Goal 4 (Week 2): Pt will complete tub bench transfer with mod A.  Skilled Therapeutic Interventions/Progress Updates:     Pt handed off in hallway from PT to this OT. Pt presenting to be in good spirits receptive to skilled OT session with MIN motivation required reporting 8/10 pain in L foot- OT offering intermittent rest breaks, repositioning, and therapeutic support to optimize participation in therapy session. Pt reporting tightness in UB, back, and neck. Transported Pt to therapy gym total A in wc for energy conservation. Stand pivot wc > EOM using RW light MIN A for balance. Engaged Pt in completing series of UB stretches sitting EOB to decreased muscle stiffness and joint tightness with noted improvement following:   -Lateral neck flexion  -Slow head rolls  -Posterior shoulder rolls  -Trunk circles clockwise/counter clockwise  -Chest openers  -Trunk flexion/extension with B UE support on large yoga ball OT providing demonstration of each stretch and verbal/tactile cues for technique.   Pt requesting to stay up in wc at end of session. Stand pivot EOM > wc using RW MIN A for balance. Pt was left resting in wc with call bell in reach, seatbelt alarm on, and all needs met. Pt requesting pain medications at end of session with RN informed.   Therapy Documentation Precautions:  Precautions Precautions: Fall Recall of Precautions/Restrictions: Impaired Restrictions Weight Bearing Restrictions Per Provider Order:  Yes LLE Weight Bearing Per Provider Order: Weight bearing as tolerated Other Position/Activity Restrictions: no restrictions   Therapy/Group: Individual Therapy  Katheryn SHAUNNA Mines 12/09/2023, 1:51 PM

## 2023-12-09 NOTE — Progress Notes (Addendum)
 Patient ID: [name], female   DOB: 11/30/1950, 73 y.o.   MRN: [id number] Have received insurance auth for pt to transfer to [facility]. [phone] 9/16-9/18 with CM-Lisa Nicholaus. Team and MD/PA aware along with pt, to plan to transfer.  10;50 AM Spoke with [name] not sure will have bed tomorrow. Will call in am to see if bed. Team, MD and pt aware of this

## 2023-12-09 NOTE — Progress Notes (Signed)
 Speech Language Pathology Discharge Summary  Patient Details  Name: [name] MRN: [id number] Date of Birth: 12-23-1950  Date of Discharge from SLP service:December 09, 2023  Patient has met 3 of 4 long term goals.  Patient to discharge at overall Supervision;Min level.  Reasons goals not met: cont to require modA for memory   Clinical Impression/Discharge Summary:  Pt has made good gains and has met 3 of 4 LTG's this admission due to improved cognition. Pt is currently an overall supervision-minA for cognitive tasks, though continues to require up to [facility] for memory. Pt/family education complete and pt will discharge SNF with 24 hour supervision. Pt would benefit from SNF f/u ST services to maximize cognition in order to maximize functional independence.   Care Partner:  Caregiver Able to Provide Assistance: Yes  Type of Caregiver Assistance: Physical;Cognitive  Recommendation:  Home Health SLP;Outpatient SLP  Rationale for SLP Follow Up: Maximize cognitive function and independence   Equipment: n/a   Reasons for discharge: Discharged from hospital   Patient/Family Agrees with Progress Made and Goals Achieved: Yes    Nichols Corter M.A., CCC-SLP 12/09/2023, 8:55 AM

## 2023-12-09 NOTE — Progress Notes (Addendum)
 Occupational Therapy Session Note  Patient Details  Name: [name] MRN: [id number] Date of Birth: 1950-10-02  Today's Date: 12/09/2023 OT Individual Time: 0915-1010 OT Individual Time Calculation (min): 55 min    Short Term Goals: Week 1:  OT Short Term Goal 1 (Week 1): Patient will complete toilet transfer with mod assist OT Short Term Goal 1 - Progress (Week 1): Met OT Short Term Goal 2 (Week 1): Patient will don pants with mod assist OT Short Term Goal 2 - Progress (Week 1): Met OT Short Term Goal 3 (Week 1): Patient will complete toileting with mod assist OT Short Term Goal 3 - Progress (Week 1): Met Week 2:  OT Short Term Goal 1 (Week 2): Pt  will complete toilet transfers with min A OT Short Term Goal 2 (Week 2): Pt will complete toileting with min A OT Short Term Goal 3 (Week 2): Pt will complete bathing with S. OT Short Term Goal 4 (Week 2): Pt will complete tub bench transfer with mod A.      Skilled Therapeutic Interventions/Progress Updates:    Pt received in bed and agreeable to a shower.  Pt only required supervision to get out of bed, use RW to transfer to Wc and then to toilet,  toilet and S to step 5 ft into shower to sit on bench.  Showered with close S.  Dressed with supervision to [facility] for LB.    Mod Ind to sit at sink to brush teeth.   She continues to confuse people, events and timelines but overall is oriented.    Because session time was running over her tech arrived to help with getting pt settled with belt alarm on. Call light in reach.   Therapy Documentation Precautions:  Precautions Precautions: Fall Recall of Precautions/Restrictions: Impaired Restrictions Weight Bearing Restrictions Per Provider Order: Yes LLE Weight Bearing Per Provider Order: Weight bearing as tolerated Other Position/Activity Restrictions: no restrictions    Vital Signs: Therapy Vitals Pulse Rate: 73 BP: 124/62 Patient Position (if appropriate):  Lying Pain: Pain Assessment Pain Scale: 0-10 Pain Score: 0-No pain ADL: ADL Eating: Independent Grooming: Modified independent Where Assessed-Grooming: Sitting at sink Upper Body Bathing: Supervision/safety Where Assessed-Upper Body Bathing: Shower Lower Body Bathing: Supervision/safety Where Assessed-Lower Body Bathing: Shower Upper Body Dressing: Supervision/safety Where Assessed-Upper Body Dressing: Wheelchair Lower Body Dressing: Contact guard Where Assessed-Lower Body Dressing: Wheelchair Toileting: Supervision/safety Where Assessed-Toileting: Teacher, adult education: Close supervision Toilet Transfer Method: Proofreader: Raised toilet seat, Grab bars Tub/Shower Transfer: Scientific laboratory technician Method: Ship broker: Insurance underwriter: Close supervision Film/video editor Method: Warden/ranger: Emergency planning/management officer, Investment banker, operational Sitting - Level of Assistance: 7: Independent Dynamic Sitting Balance Dynamic Sitting - Level of Assistance: 7: Independent Static Standing Balance Static Standing - Level of Assistance: 5: Stand by assistance Dynamic Standing Balance Dynamic Standing - Level of Assistance: 5: Stand by assistance (with UE on RW)   Therapy/Group: Individual Therapy  [name] 12/09/2023, 10:43 AM

## 2023-12-09 NOTE — Progress Notes (Signed)
 Physical Therapy Session Note  Patient Details  Name: [name] MRN: [id number] Date of Birth: [birth year]  Today's Date: 12/09/2023 PT Individual Time: 8946-8841; 1348 - 1418 PT Individual Time Calculation (min): 65 min; 30 min   Short Term Goals: Week 2:  PT Short Term Goal 1 (Week 2): STG = LTG d/t ELOS  SESSION 1 Skilled Therapeutic Interventions/Progress Updates: Patient sitting in [facility] with LCSW present discussing d/c to SNF update on entrance to room. Patient alert and agreeable to PT session.   Patient reported no pain at rest that increased with therex below (cues to breath through pain - unrated, but tolerable per pt report up until final external rotation movement on L LE at end of session that required rest).   Wheelchair Mobility:  Pt propelled wheelchair with supervision (roughly 150') and min cuing to watch for objects on L side of hallway. Pt propelled WC safer this session session vs previous session with this PTA - Pt donned/doffed B leg rests with demonstrative cuing required on sequence (increased time required to recall sequence and execute). Pt required minA at first then progressed to supervision. Pt transported back to room at end of session due to fatigue and time management.  - Pt navigated bowling pins with PTA calling out specific path to increase challenge to turns  Therapeutic Exercise: Pt performed the following exercises with therapist providing the described cuing and facilitation for improvement. L LAQ 2 x 10 with 5 s peak available extension hold and added cuing to control eccentric and to avoid holding breathe  L hip external rotation with cues to touch R lower leg with L heel and pt requiring minA to achieve ROM (pt unable to do so due to pain/decreased strength  Patient sitting in [facility] at end of session with brakes locked, belt alarm set, and all needs within reach.  SESSION 2  Skilled Therapeutic Interventions/Progress Updates: Patient supine in  bed on entrance to room. Patient alert and agreeable to PT session. Pt performed supine<sit to EOB with supervision (use of bed railing to assist with bed flat). Pt pivoted to EOB and performed sit<>stand transfer in RW with supervision and VC for hand placement. Pt transported from room<>ortho gym via [facility] for time management/energy conservation. Pt on UBE for 11 min on level 1 resistance with breaks required and cues to maintain pace partner set to 30 rpm. Pt cued to breath through nostrils deeply to expand upper torso, and to exhale as pt reported slight increase in tension on L of sternum at pectoralis major attachment. PTA discussed with next OT to assist with stretching/gentle ROM.   Patient handed off to OT in [facility] with brakes locked at end of session.       Therapy Documentation Precautions:  Precautions Precautions: Fall Recall of Precautions/Restrictions: Impaired Restrictions Weight Bearing Restrictions Per Provider Order: Yes LLE Weight Bearing Per Provider Order: Weight bearing as tolerated Other Position/Activity Restrictions: no restrictions  Therapy/Group: Individual Therapy  Jaman Aro PTA 12/09/2023, 2:12 PM

## 2023-12-10 LAB — GLUCOSE, CAPILLARY
Glucose-Capillary: 160 mg/dL — ABNORMAL HIGH (ref 70–99)
Glucose-Capillary: 170 mg/dL — ABNORMAL HIGH (ref 70–99)

## 2023-12-10 NOTE — Progress Notes (Signed)
 Inpatient Rehabilitation Care Coordinator Discharge Note   Patient Details  Name: [name] MRN: [id number] Date of Birth: Apr 01, 1950   Discharge location: [name]  Length of Stay: 14 DAYS  Discharge activity level: MIN LEVEL  Home/community participation: ACTIVE  Patient response un:Yzjouy Literacy - How often do you need to have someone help you when you read instructions, pamphlets, or other written material from your doctor or pharmacy?: Never  Patient response un:Dnrpjo Isolation - How often do you feel lonely or isolated from those around you?: Never  Services provided included: MD, RD, PT, OT, SLP, RN, CM, Pharmacy, Neuropsych, SW  Financial Services:  Field seismologist Utilized: Private Insurance [facility] MEDICARE  Choices offered to/list presented to: PT  Follow-up services arranged:  Other (Comment) (SNF)     [facility] COMING AT 12;30 TO TRANSPORT      Patient response to transportation need: Is the patient able to respond to transportation needs?: Yes In the past 12 months, has lack of transportation kept you from medical appointments or from getting medications?: No In the past 12 months, has lack of transportation kept you from meetings, work, or from getting things needed for daily living?: No   Patient/Family verbalized understanding of follow-up arrangements:  Yes  Individual responsible for coordination of the follow-up plan: SELF [id number]  Confirmed correct DME delivered: [name] MATSU 12/10/2023    Comments (or additional information):PT NEEDS 24/7 CARE AND NO ONE TO PROVIDE THIS. HOPES AFTER MORE REHAB CAN GO HOME.  Summary of Stay    Date/Time Discharge Planning CSW  12/10/23 0831 Awaiting to see if [name] will have a bed for her tomorrow, last day of insurance auth, if not will need to find another facility to take tomorrow RGD  12/03/23 0838 Pt wants to go home limited support at home-mom at [employer] during the  day and is 73 yo, son works during the day and brother comes at night to care for their mom. Goals PT-Mod/i and OT-mod/max big variation in goals. Does have attention issues. With such high level for mobility insurance probably would not cover SNF and pt doesn't want to go to one, she also has managed medicare plan RGD  11/26/23 0922 new evaluation unsure of the plan lives with mom who is elderly and needs assist, brother who is in and out and son who works. Will try to confirm plan when see today RGD       [name] G

## 2023-12-10 NOTE — Progress Notes (Addendum)
 Patient ID: [name], female   DOB: 1950-04-10, 73 y.o.   MRN: [id number]  Contacted [facility] to see if will have a bed for tomorrow if not will need to find another NH for pt to go since insurance auth is only good until tomorrow. Pt is aware of this  11:06 AM Heard from [facility] they do have a bed today for pt and she is agreeable to this. Will se tup non-emergency ambulance for 12:30

## 2023-12-10 NOTE — Plan of Care (Signed)
 Continue plan of care.

## 2023-12-10 NOTE — Patient Care Conference (Signed)
 Inpatient RehabilitationTeam Conference and Plan of Care Update Date: 12/10/2023   Time: 10:12 AM    Patient Name: [name]      Medical Record Number: [id number]  Date of Birth: 1951-03-07 Sex: Female         Room/Bed: 4W08C/4W08C-01 Payor Info: Payor: Advertising copywriter MEDICARE / Plan: [facility] MEDICARE / Product Type: *No Product type* /    Admit Date/Time:  11/25/2023  2:08 PM  Primary Diagnosis:  Embolic stroke [facility])  Hospital Problems: Principal Problem:   Embolic stroke [facility]) Active Problems:   Intertrochanteric fracture of left hip (HCC)   Pressure injury of skin   Cognitive and neurobehavioral dysfunction    Expected Discharge Date: Expected Discharge Date: 12/10/23  Team Members Present: Physician leading conference: Dr. Prentice Compton Social Worker Present: Rhoda Clement, LCSW Nurse Present: Barnie Ronde, RN PT Present: Sherlean Perks, PT;Dominic [place], PTA OT Present: Recardo Maxwell, OT SLP Present: Recardo Mole, SLP PPS Coordinator present : Eleanor Colon, SLP     Current Status/Progress Goal Weekly Team Focus  Bowel/Bladder   Continent of bowel and bladder   Remain continent of bowel and bladder   Bladder scan patient every 6 hours and encourage voiding    Swallow/Nutrition/ Hydration               ADL's   self care and mobility goals have been met. pt is CGA to close supervision overall.  She continues to have difficulty with day to day recall.   LTGs were upgraded due to progress to CGA level.   continued balance and endurance training with ADLS, pt education    Mobility   Bed mobility = minA without bed railing; Transfers = supervision with RW; Ambulation up to 34' CGA in RW w/ WC follow   Supervision = transfers; minA gait due to pain  Pt is currently awaiting SNF placement. Continue with endurance, therex, education    Communication                Safety/Cognition/ Behavioral Observations  supervision-minA for problem solving,  awareness, attention modA for memory   minA   short term memory - use of memory book, sustained attention to functional tasks, problem solving, awareness    Pain   Patient reports pain in left leg. Scheduled ultram  and voltaren  ordered   Pain less than 4/10   Assess pain every shift and as needed. Treat pain with PRN interventions.    Skin   Incisions to left leg scabbed and open to air. Heal stage 2 on sacrum OTA   Continued wound healing of left leg incisions  Assess skin every shift and PRN.      Discharge Planning:  Awaiting to see if [facility] will have a bed for her tomorrow, last day of insurance auth, if not will need to find another facility to take tomorrow   Team Discussion: Patient admitted post CVA with left shoulder pain and hip pain/tightness.   Patient on target to meet rehab goals: yes, currently limited by hip pain and poor awareness of deficits although ROM has improved. On target to meet goals of CGA- Min assist overall.  *See Care Plan and progress notes for long and short-term goals.   Revisions to Treatment Plan:  N/a   Teaching Needs: Safety, medications, dietary modification, transfers, toileting, etc.  Current Barriers to Discharge: Home enviroment access/layout and Lack of/limited family support  Possible Resolutions to Barriers: SNF pending     Medical Summary Current  Status: left shoulder and hip pain improved, delayed d/c due to SNF bed availability     Possible Resolutions to Barriers/Weekly Focus: awaiting SNF bed, stable for d/c   Continued Need for Acute Rehabilitation Level of Care: The patient requires daily medical management by a physician with specialized training in physical medicine and rehabilitation for the following reasons: Analysis of laboratory values and/or radiology reports with any subsequent need for medication adjustment and/or medical intervention. : Yes   I attest that I was present, lead the team  conference, and concur with the assessment and plan of the team.   Fredericka Sober B 12/10/2023, 4:27 PM

## 2023-12-10 NOTE — Plan of Care (Signed)
  Problem: RH Balance Goal: LTG: Patient will maintain dynamic sitting balance (OT) Description: LTG:  Patient will maintain dynamic sitting balance with assistance during activities of daily living (OT) Outcome: Completed/Met Goal: LTG Patient will maintain dynamic standing with ADLs (OT) Description: LTG:  Patient will maintain dynamic standing balance with assist during activities of daily living (OT)  Outcome: Completed/Met   Problem: Sit to Stand Goal: LTG:  Patient will perform sit to stand in prep for activites of daily living with assistance level (OT) Description: LTG:  Patient will perform sit to stand in prep for activites of daily living with assistance level (OT) Outcome: Completed/Met   Problem: RH Bathing Goal: LTG Patient will bathe all body parts with assist levels (OT) Description: LTG: Patient will bathe all body parts with assist levels (OT) Outcome: Completed/Met   Problem: RH Dressing Goal: LTG Patient will perform lower body dressing w/assist (OT) Description: LTG: Patient will perform lower body dressing with assist, with/without cues in positioning using equipment (OT) Outcome: Completed/Met   Problem: RH Toileting Goal: LTG Patient will perform toileting task (3/3 steps) with assistance level (OT) Description: LTG: Patient will perform toileting task (3/3 steps) with assistance level (OT)  Outcome: Completed/Met   Problem: RH Toileting Goal: LTG Patient will perform toileting task (3/3 steps) with assistance level (OT) Description: LTG: Patient will perform toileting task (3/3 steps) with assistance level (OT)  Outcome: Completed/Met   Problem: RH Toilet Transfers Goal: LTG Patient will perform toilet transfers w/assist (OT) Description: LTG: Patient will perform toilet transfers with assist, with/without cues using equipment (OT) Outcome: Completed/Met   Problem: RH Tub/Shower Transfers Goal: LTG Patient will perform tub/shower transfers w/assist  (OT) Description: LTG: Patient will perform tub/shower transfers with assist, with/without cues using equipment (OT) Outcome: Completed/Met   Problem: RH Attention Goal: LTG Patient will demonstrate this level of attention during functional activites (OT) Description: LTG:  Patient will demonstrate this level of attention during functional activites  (OT) Outcome: Completed/Met

## 2023-12-10 NOTE — Plan of Care (Signed)
  Problem: RH Problem Solving Goal: LTG Patient will demonstrate problem solving for (SLP) Description: LTG:  Patient will demonstrate problem solving for basic/complex daily situations with cues  (SLP) Outcome: Completed/Met   Problem: RH Attention Goal: LTG Patient will demonstrate this level of attention during functional activites (SLP) Description: LTG:  Patient will will demonstrate this level of attention during functional activites (SLP) Outcome: Completed/Met   Problem: RH Awareness Goal: LTG: Patient will demonstrate awareness during functional activites type of (SLP) Description: LTG: Patient will demonstrate awareness during functional activites type of (SLP) Outcome: Completed/Met   Problem: RH Memory Goal: LTG Patient will demonstrate ability for day to day (SLP) Description: LTG:   Patient will demonstrate ability for day to day recall/carryover during cognitive/linguistic activities with assist  (SLP) Outcome: Not Met (cont to require modA)

## 2023-12-10 NOTE — Progress Notes (Signed)
 Physical Therapy Discharge Summary  Patient Details  Name: [name] MRN: [id number] Date of Birth: 01-Dec-1950  Date of Discharge from PT service:December 10, 2023  Patient has met 9 of 11 long term goals due to {due un:6958322}.  Patient to discharge at [facility] level {LOA:3049010}.   Patient's care partner {care partner:3041650} to provide the necessary {assistance:3041652} assistance at discharge.  Reasons goals not met: Pt did not meet goals due to poor endurance/tolerance to WB on L LE  Recommendation:  Patient will benefit from ongoing skilled PT services in {setting:3041680} to continue to advance safe functional mobility, address ongoing impairments in ***, and minimize fall risk.  Equipment: {equipment:3041657}  Reasons for discharge: {Reason for discharge:3049018}  Patient/family agrees with progress made and goals achieved: {Pt/Family agree with progress/goals:3049020}  PT Discharge Precautions/Restrictions Precautions Precautions: Fall Restrictions Weight Bearing Restrictions Per Provider Order: Yes LLE Weight Bearing Per Provider Order: Weight bearing as tolerated Pain Pain Assessment Pain Scale: 0-10 Pain Score: 0-No pain (at rest) Pain Location: Hip Pain Interference Pain Interference Pain Effect on Sleep: 2. Occasionally Pain Interference with Therapy Activities: 3. Frequently;1. Rarely or not at all (Pt poor historian and c/o pain every session that prevented progression with interventions and reported rarely/not at all pain.) Pain Interference with Day-to-Day Activities: 2. Occasionally Vision/Perception  Vision - History Ability to See in Adequate Light: 1 Impaired  Cognition Overall Cognitive Status: Impaired/Different from baseline Arousal/Alertness: Awake/alert Orientation Level: Oriented X4 Attention: Sustained Sustained Attention: Impaired Memory: Impaired Awareness: Impaired Problem Solving: Impaired Safety/Judgment:  Impaired Sensation Sensation Light Touch: Appears Intact Hot/Cold: Appears Intact Proprioception: Appears Intact Coordination Gross Motor Movements are Fluid and Coordinated: No Coordination and Movement Description: relies on RW for support in standing and ambulation due to impaired LB tolerance to pain on L LE Motor  Motor Motor - Discharge Observations: LLE weakness due to hip fx (has improved since evaluation)  Mobility Bed Mobility Bed Mobility: Rolling Right;Rolling Left;Sit to Supine;Supine to Sit Rolling Right: Independent with assistive device Rolling Left: Independent with assistive device Supine to Sit: Independent with assistive device Sit to Supine: Independent with assistive device Transfers Transfers: Sit to Stand;Stand to Sit;Stand Pivot Transfers Sit to Stand: Supervision/Verbal cueing Stand to Sit: Supervision/Verbal cueing Stand Pivot Transfers: Supervision/Verbal cueing Transfer (Assistive device): Rolling walker Locomotion  Gait Ambulation: Yes Gait Assistance: Contact Guard/Touching assist Gait Distance (Feet): 34 Feet Assistive device: Rolling walker Gait Assistance Details: Verbal cues for safe use of DME/AE;Verbal cues for sequencing Gait Gait: Yes Gait Pattern: Impaired Gait Pattern: Antalgic;Decreased weight shift to left;Decreased stance time - left;Poor foot clearance - right;Decreased step length - right Gait velocity: decreased Stairs / Additional Locomotion Stairs: No Wheelchair Mobility Wheelchair Mobility: Yes Wheelchair Assistance: Doctor, general practice: Both upper extremities Wheelchair Parts Management: Supervision/cueing Distance: 150  Trunk/Postural Assessment  Cervical Assessment Cervical Assessment: Within Functional Limits Thoracic Assessment Thoracic Assessment: Within Functional Limits Lumbar Assessment Lumbar Assessment: Exceptions to [facility] Postural Control Postural Control: Within Functional  Limits  Balance Balance Balance Assessed: Yes Static Sitting Balance Static Sitting - Balance Support: Feet supported Static Sitting - Level of Assistance: 7: Independent Dynamic Sitting Balance Dynamic Sitting - Balance Support: During functional activity;No upper extremity supported Dynamic Sitting - Level of Assistance: 7: Independent Static Standing Balance Static Standing - Balance Support: Bilateral upper extremity supported Static Standing - Level of Assistance: 5: Stand by assistance Dynamic Standing Balance Dynamic Standing - Balance Support: During functional activity;Bilateral upper extremity supported Dynamic Standing - Level of  Assistance: 5: Stand by assistance Extremity Assessment  RLE Assessment RLE Assessment: Within Functional Limits LLE Assessment General Strength Comments: grossly 3+/5 knee, 2+/5 hips   Dominic Sandoval PTA 12/10/2023, 4:38 PM

## 2023-12-10 NOTE — Progress Notes (Signed)
 PROGRESS NOTE   Subjective/Complaints:  No knee or shoulder complaints today.  Discussed skilled nursing is preferred discharge option due to her inability from cognitive and balance standpoint to live alone.  She has no family support for 24/7 supervision or min assist level. We   ROS: Patient denies fever, rash, sore throat, blurred vision, dizziness, nausea, vomiting, diarrhea, cough, shortness of breath or chest pain,  back/neck pain, headache, or mood change.  + Left knee pain   Objective:   No results found.   No results for input(s): WBC, HGB, HCT, PLT in the last 72 hours.  Recent Labs    12/07/23 2358  NA 133*  K 4.1  CL 97*  CO2 27  GLUCOSE 147*  BUN 25*  CREATININE 1.32*  CALCIUM  9.3      Intake/Output Summary (Last 24 hours) at 12/10/2023 0938 Last data filed at 12/10/2023 0800 Gross per 24 hour  Intake 637 ml  Output 400 ml  Net 237 ml        Physical Exam: Vital Signs Blood pressure 118/63, pulse 65, temperature 97.8 F (36.6 C), temperature source Oral, resp. rate 12, height 5' 4 (1.626 m), weight 73.9 kg, SpO2 99%.  General: No acute distress Mood and affect are appropriate Heart: Regular rate and rhythm no rubs murmurs or extra sounds Lungs: Clear to auscultation, breathing unlabored, no rales or wheezes Abdomen: Positive bowel sounds, soft nontender to palpation, nondistended Extremities: No clubbing, cyanosis, or edema Skin: No evidence of breakdown, no evidence of rash  Neurologic: Cranial nerves II through XII intact,    motor strength is 5/5 in right deltoid, bicep, tricep, grip, hip flexor, knee extensors, ankle dorsiflexor and plantar flexor Left  4-/5  deltoid ,biceps, triceps  grip, WF, WE. LLE limited by pain for 3- HF, KE, 4/5 ankle PF and DF   Musculoskeletal: full AROM BUE for flexion ! Left knee contracture lack 20 deg from full ext, + bony enlargement     Assessment/Plan: 1. Functional deficits which require 3+ hours per day of interdisciplinary therapy in a comprehensive inpatient rehab setting. Physiatrist is providing close team supervision and 24 hour management of active medical problems listed below. Physiatrist and rehab team continue to assess barriers to discharge/monitor patient progress toward functional and medical goals  Care Tool:  Bathing    Body parts bathed by patient: Right arm, Left arm, Chest, Abdomen, Front perineal area, Right upper leg, Left upper leg, Face, Buttocks, Right lower leg, Left lower leg   Body parts bathed by helper: Right lower leg, Left lower leg, Buttocks     Bathing assist Assist Level: Supervision/Verbal cueing     Upper Body Dressing/Undressing Upper body dressing   What is the patient wearing?: Pull over shirt, Bra    Upper body assist Assist Level: Set up assist    Lower Body Dressing/Undressing Lower body dressing      What is the patient wearing?: Pants, Incontinence brief     Lower body assist Assist for lower body dressing: Contact Guard/Touching assist     Toileting Toileting    Toileting assist Assist for toileting: Supervision/Verbal cueing     Transfers Chair/bed transfer  Transfers assist     Chair/bed transfer assist level: Supervision/Verbal cueing     Locomotion Ambulation   Ambulation assist      Assist level: Contact Guard/Touching assist Assistive device: Walker-rolling Max distance: 15'   Walk 10 feet activity   Assist  Walk 10 feet activity did not occur: Safety/medical concerns  Assist level: Contact Guard/Touching assist Assistive device: Walker-rolling   Walk 50 feet activity   Assist Walk 50 feet with 2 turns activity did not occur: Safety/medical concerns         Walk 150 feet activity   Assist Walk 150 feet activity did not occur: Safety/medical concerns         Walk 10 feet on uneven surface   activity   Assist Walk 10 feet on uneven surfaces activity did not occur: Safety/medical concerns         Wheelchair     Assist Is the patient using a wheelchair?: Yes Type of Wheelchair: Manual    Wheelchair assist level: Supervision/Verbal cueing Max wheelchair distance: 25    Wheelchair 50 feet with 2 turns activity    Assist        Assist Level: Moderate Assistance - Patient 50 - 74%   Wheelchair 150 feet activity     Assist      Assist Level: Dependent - Patient 0%   Blood pressure 118/63, pulse 65, temperature 97.8 F (36.6 C), temperature source Oral, resp. rate 12, height 5' 4 (1.626 m), weight 73.9 kg, SpO2 99%.  Medical Problem List and Plan: 1. 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  Possible punctate infarct left middle cerebellar peduncle versus artifact.  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             -patient may  shower-cover L hip dressing with occlusive dressing             Plan d/c to [facility] in am depending on bed availability may need to look for other options.  Insurance authorization runs out tomorrow           -Continue CIR therapies including PT, OT, and SLP     2.  Antithrombotics: -DVT/anticoagulation:  Pharmaceutical: Lovenox .  Check vascular study             -antiplatelet therapy: Aspirin  81 mg daily and Plavix  75 mg daily 3. Pain Management: Neurontin  800 mg 3 times daily, Voltaren  gel 4 g 4 times daily to affected area, oxycodone  5 mg every 4 hours as needed, tramadol  50 mg every 6 hours as needed, Robaxin  500 mg every 8 hours as needed- of note, doesn't like to take meds- encouraged her to do so prior to therapy  -9/12 increased left knee pain   -will add voltaren  gel placement to left knee   -?knee sleeve?   9/13: ordered ACE wrap for knee for  compression, Voltaren  gel 4 times daily--9-14 patient has not received Voltaren  yet so we will continue with this plan.  4. Mood/Behavior/Sleep: Wellbutrin  150 mg twice daily, trazodone  25 mg nightly as needed             -antipsychotic agents: N/A 5. Neuropsych/cognition: This patient is capable of making decisions on her own behalf. 6. Skin/Wound Care: Routine skin checks 7. Fluids/Electrolytes/Nutrition: Routine in and outs with follow-up chemistries 8.  Left nondisplaced intertrochanteric hip fracture of the  proximal left femur.  Status post IM nailing 11/19/2023.  Weightbearing as tolerated 9.  Acute blood loss anemia.  Follow-up CBC in am 9/8 10.  Hypertension.  Norvasc  10 mg daily, Coreg  25 mg twice daily, lisinopril  40 mg daily.     9/12 good control Vitals:   12/09/23 2051 12/10/23 0437  BP: 121/72 118/63  Pulse: 65 65  Resp: 16 12  Temp: 98 F (36.7 C) 97.8 F (36.6 C)  SpO2: 99% 99%    11.  Diabetes mellitus.  Hemoglobin A1c 12.4.  Currently on Lantus  insulin  15 units every 24 hours.  Check blood sugars AC and at bedtime.  Prior to admission patient on Glucophage  500 mg daily, Trulicity weekly CBG (last 3)  Recent Labs    12/09/23 1657 12/09/23 2049 12/10/23 0625  GLUCAP 158* 196* 160*  Increase Lantus  to 20U 9/5- am CBG well controlled Metformin  started 9/5 and then stopped 9/12 Started novolog  3U TID-- 9/10--improving control  -increase to 4u tid 9/15- improved 9/16   12.  AKI on CKD stage IIIB.  Follow-up chemistries    Latest Ref Rng & Units 12/07/2023   11:58 PM 12/01/2023    5:27 AM 11/26/2023    4:52 AM  BMP  Glucose 70 - 99 mg/dL 852  871  866   BUN 8 - 23 mg/dL 25  30  15    Creatinine 0.44 - 1.00 mg/dL 8.67  8.61  8.81   Sodium 135 - 145 mmol/L 133  136  135   Potassium 3.5 - 5.1 mmol/L 4.1  4.3  4.3   Chloride 98 - 111 mmol/L 97  100  99   CO2 22 - 32 mmol/L 27  27  26    Calcium  8.9 - 10.3 mg/dL 9.3  9.6  9.0     13.  Tobacco abuse.  NicoDerm patch.   Provide counseling 14.  Hyperlipidemia.  Lipitor 15.  Constipation.  Adjust bowel program as needed- gave Sorbitol  30cc since lBM 4-5 days ago- if doesn't have good results, suggest Soap Suds enema.   - 9/14: Last bowel movement type 3 small   16. Urinary retention with overflow- resolved d/c flomax  9/15  9-13: Bladder scans 250-108.  Continue to monitor.  All continent  17. Mild spasticity- monitor for symptoms  18.  Left shoulder pain improved with OT no further c/o with AROM   21.  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

## 2023-12-15 ENCOUNTER — Telehealth: Payer: Self-pay | Admitting: Neurology

## 2023-12-15 NOTE — Telephone Encounter (Signed)
 [facility] called to cancel Pt appt due to finding sooner appt   Appt Canceled

## 2023-12-22 ENCOUNTER — Encounter: Attending: Registered Nurse | Admitting: Registered Nurse

## 2024-01-07 ENCOUNTER — Ambulatory Visit: Admitting: Neurology
